# Patient Record
Sex: Male | Born: 1965
Health system: Southern US, Community
[De-identification: ages and names within clinical notes are randomized; demographics above are authoritative.]

## PROBLEM LIST (undated history)

## (undated) DIAGNOSIS — M1990 Unspecified osteoarthritis, unspecified site: Secondary | ICD-10-CM

## (undated) DIAGNOSIS — R51 Headache: Secondary | ICD-10-CM

## (undated) DIAGNOSIS — M7120 Synovial cyst of popliteal space [Baker], unspecified knee: Secondary | ICD-10-CM

## (undated) DIAGNOSIS — I639 Cerebral infarction, unspecified: Secondary | ICD-10-CM

## (undated) DIAGNOSIS — G43909 Migraine, unspecified, not intractable, without status migrainosus: Secondary | ICD-10-CM

## (undated) DIAGNOSIS — I499 Cardiac arrhythmia, unspecified: Secondary | ICD-10-CM

## (undated) DIAGNOSIS — M545 Low back pain, unspecified: Secondary | ICD-10-CM

## (undated) DIAGNOSIS — R569 Unspecified convulsions: Secondary | ICD-10-CM

## (undated) DIAGNOSIS — E785 Hyperlipidemia, unspecified: Secondary | ICD-10-CM

## (undated) DIAGNOSIS — F609 Personality disorder, unspecified: Secondary | ICD-10-CM

## (undated) DIAGNOSIS — M25569 Pain in unspecified knee: Secondary | ICD-10-CM

## (undated) DIAGNOSIS — G4733 Obstructive sleep apnea (adult) (pediatric): Secondary | ICD-10-CM

## (undated) DIAGNOSIS — C801 Malignant (primary) neoplasm, unspecified: Secondary | ICD-10-CM

## (undated) HISTORY — DX: Low back pain, unspecified: M54.50

## (undated) HISTORY — DX: Pain in unspecified knee: M25.569

## (undated) HISTORY — DX: Personality disorder, unspecified: F60.9

## (undated) HISTORY — DX: Migraine, unspecified, not intractable, without status migrainosus: G43.909

## (undated) HISTORY — DX: Synovial cyst of popliteal space (Baker), unspecified knee: M71.20

## (undated) HISTORY — PX: COLONOSCOPY: SHX174

## (undated) HISTORY — DX: Obstructive sleep apnea (adult) (pediatric): G47.33

## (undated) HISTORY — DX: Headache: R51

## (undated) HISTORY — DX: Low back pain: M54.5

---

## 1998-02-07 ENCOUNTER — Encounter: Admission: RE | Admit: 1998-02-07 | Discharge: 1998-03-09 | Payer: Self-pay | Admitting: Orthopedic Surgery

## 1998-10-10 ENCOUNTER — Encounter: Admission: RE | Admit: 1998-10-10 | Discharge: 1999-01-08 | Payer: Self-pay

## 2001-09-19 ENCOUNTER — Emergency Department (HOSPITAL_COMMUNITY): Admission: EM | Admit: 2001-09-19 | Discharge: 2001-09-19 | Payer: Self-pay | Admitting: Emergency Medicine

## 2003-02-28 ENCOUNTER — Ambulatory Visit (HOSPITAL_COMMUNITY): Admission: RE | Admit: 2003-02-28 | Discharge: 2003-02-28 | Payer: Self-pay | Admitting: Neurosurgery

## 2004-01-16 ENCOUNTER — Ambulatory Visit: Payer: Self-pay | Admitting: Internal Medicine

## 2004-04-16 ENCOUNTER — Ambulatory Visit: Payer: Self-pay | Admitting: Internal Medicine

## 2004-05-15 ENCOUNTER — Ambulatory Visit: Payer: Self-pay | Admitting: Internal Medicine

## 2004-07-31 ENCOUNTER — Ambulatory Visit: Payer: Self-pay | Admitting: Internal Medicine

## 2004-11-01 ENCOUNTER — Ambulatory Visit: Payer: Self-pay | Admitting: Internal Medicine

## 2004-11-21 ENCOUNTER — Ambulatory Visit: Payer: Self-pay | Admitting: Internal Medicine

## 2005-01-01 ENCOUNTER — Ambulatory Visit: Payer: Self-pay | Admitting: Internal Medicine

## 2005-01-31 ENCOUNTER — Ambulatory Visit: Payer: Self-pay | Admitting: Internal Medicine

## 2005-04-08 ENCOUNTER — Ambulatory Visit: Payer: Self-pay | Admitting: Internal Medicine

## 2005-07-08 ENCOUNTER — Ambulatory Visit: Payer: Self-pay | Admitting: Internal Medicine

## 2005-08-14 ENCOUNTER — Ambulatory Visit: Payer: Self-pay | Admitting: Internal Medicine

## 2005-09-27 ENCOUNTER — Ambulatory Visit: Payer: Self-pay | Admitting: Internal Medicine

## 2005-12-27 ENCOUNTER — Ambulatory Visit: Payer: Self-pay | Admitting: Internal Medicine

## 2005-12-27 LAB — CONVERTED CEMR LAB
ALT: 34 units/L (ref 0–40)
AST: 31 units/L (ref 0–37)
Albumin: 3.9 g/dL (ref 3.5–5.2)
Alkaline Phosphatase: 100 units/L (ref 39–117)
Bilirubin, Direct: 0.1 mg/dL (ref 0.0–0.3)
Chol/HDL Ratio, serum: 6.5
Cholesterol: 153 mg/dL (ref 0–200)
HDL: 23.4 mg/dL — ABNORMAL LOW (ref >39.0)
LDL DIRECT: 88.3 mg/dL
Total Bilirubin: 0.7 mg/dL (ref 0.3–1.2)
Total Protein: 7.2 g/dL (ref 6.0–8.3)
Triglyceride fasting, serum: 231 mg/dL (ref 0–149)
VLDL: 46 mg/dL — ABNORMAL HIGH (ref 0–40)

## 2006-01-29 ENCOUNTER — Ambulatory Visit: Payer: Self-pay | Admitting: Internal Medicine

## 2006-04-04 ENCOUNTER — Ambulatory Visit: Payer: Self-pay | Admitting: Internal Medicine

## 2006-09-16 ENCOUNTER — Telehealth: Payer: Self-pay | Admitting: Internal Medicine

## 2006-09-25 ENCOUNTER — Telehealth (INDEPENDENT_AMBULATORY_CARE_PROVIDER_SITE_OTHER): Payer: Self-pay | Admitting: *Deleted

## 2006-10-13 ENCOUNTER — Telehealth: Payer: Self-pay | Admitting: *Deleted

## 2006-11-06 DIAGNOSIS — M5440 Lumbago with sciatica, unspecified side: Secondary | ICD-10-CM

## 2006-11-06 DIAGNOSIS — R569 Unspecified convulsions: Secondary | ICD-10-CM | POA: Insufficient documentation

## 2006-11-06 DIAGNOSIS — M545 Low back pain, unspecified: Secondary | ICD-10-CM | POA: Insufficient documentation

## 2006-11-06 DIAGNOSIS — R519 Headache, unspecified: Secondary | ICD-10-CM | POA: Insufficient documentation

## 2006-11-06 DIAGNOSIS — R51 Headache: Secondary | ICD-10-CM

## 2006-11-10 ENCOUNTER — Ambulatory Visit: Payer: Self-pay | Admitting: Internal Medicine

## 2006-11-19 ENCOUNTER — Telehealth (INDEPENDENT_AMBULATORY_CARE_PROVIDER_SITE_OTHER): Payer: Self-pay | Admitting: *Deleted

## 2006-11-27 ENCOUNTER — Ambulatory Visit: Payer: Self-pay | Admitting: Internal Medicine

## 2006-11-27 LAB — CONVERTED CEMR LAB
ALT: 25 units/L (ref 0–53)
AST: 30 units/L (ref 0–37)
Albumin: 3.9 g/dL (ref 3.5–5.2)
Alkaline Phosphatase: 105 units/L (ref 39–117)
BUN: 12 mg/dL (ref 6–23)
Basophils Absolute: 0 10*3/uL (ref 0.0–0.1)
Basophils Relative: 0.2 % (ref 0.0–1.0)
Bilirubin Urine: NEGATIVE
Bilirubin, Direct: 0.2 mg/dL (ref 0.0–0.3)
Blood in Urine, dipstick: NEGATIVE
CO2: 29 meq/L (ref 19–32)
Calcium: 9.2 mg/dL (ref 8.4–10.5)
Chloride: 108 meq/L (ref 96–112)
Cholesterol: 134 mg/dL (ref 0–200)
Creatinine, Ser: 0.9 mg/dL (ref 0.4–1.5)
Eosinophils Absolute: 0.1 10*3/uL (ref 0.0–0.6)
Eosinophils Relative: 3.1 % (ref 0.0–5.0)
GFR calc Af Amer: 120 mL/min
GFR calc non Af Amer: 99 mL/min
Glucose, Bld: 90 mg/dL (ref 70–99)
Glucose, Urine, Semiquant: NEGATIVE
HCT: 39.7 % (ref 39.0–52.0)
HDL: 20.2 mg/dL — ABNORMAL LOW (ref >39.0)
Hemoglobin: 14.2 g/dL (ref 13.0–17.0)
Ketones, urine, test strip: NEGATIVE
LDL Cholesterol: 92 mg/dL (ref 0–99)
Lymphocytes Relative: 35.3 % (ref 12.0–46.0)
MCHC: 35.8 g/dL (ref 30.0–36.0)
MCV: 92.5 fL (ref 78.0–100.0)
Monocytes Absolute: 0.4 10*3/uL (ref 0.2–0.7)
Monocytes Relative: 8.6 % (ref 3.0–11.0)
Neutro Abs: 2.5 10*3/uL (ref 1.4–7.7)
Neutrophils Relative %: 52.8 % (ref 43.0–77.0)
Nitrite: NEGATIVE
Platelets: 164 10*3/uL (ref 150–400)
Potassium: 5 meq/L (ref 3.5–5.1)
Protein, U semiquant: NEGATIVE
RBC: 4.29 M/uL (ref 4.22–5.81)
RDW: 11.8 % (ref 11.5–14.6)
Sodium: 142 meq/L (ref 135–145)
Specific Gravity, Urine: >=1.03
TSH: 3.94 microintl units/mL (ref 0.35–5.50)
Total Bilirubin: 0.8 mg/dL (ref 0.3–1.2)
Total CHOL/HDL Ratio: 6.6
Total Protein: 6.7 g/dL (ref 6.0–8.3)
Triglycerides: 111 mg/dL (ref 0–149)
Urobilinogen, UA: 0.2
VLDL: 22 mg/dL (ref 0–40)
WBC Urine, dipstick: NEGATIVE
WBC: 4.6 10*3/uL (ref 4.5–10.5)
pH: 5.5

## 2006-12-04 ENCOUNTER — Ambulatory Visit: Payer: Self-pay | Admitting: Internal Medicine

## 2007-02-06 ENCOUNTER — Telehealth: Payer: Self-pay | Admitting: Internal Medicine

## 2007-03-31 ENCOUNTER — Ambulatory Visit: Payer: Self-pay | Admitting: Internal Medicine

## 2007-04-06 ENCOUNTER — Telehealth: Payer: Self-pay | Admitting: Internal Medicine

## 2007-04-09 ENCOUNTER — Ambulatory Visit: Payer: Self-pay | Admitting: Internal Medicine

## 2007-04-29 ENCOUNTER — Telehealth: Payer: Self-pay | Admitting: Internal Medicine

## 2007-04-30 ENCOUNTER — Telehealth: Payer: Self-pay | Admitting: Internal Medicine

## 2007-04-30 ENCOUNTER — Telehealth: Payer: Self-pay | Admitting: *Deleted

## 2007-06-08 ENCOUNTER — Telehealth: Payer: Self-pay | Admitting: Internal Medicine

## 2007-06-11 ENCOUNTER — Telehealth: Payer: Self-pay | Admitting: Internal Medicine

## 2007-06-11 ENCOUNTER — Encounter: Payer: Self-pay | Admitting: Internal Medicine

## 2007-06-16 ENCOUNTER — Encounter: Payer: Self-pay | Admitting: Internal Medicine

## 2007-07-03 ENCOUNTER — Telehealth: Payer: Self-pay | Admitting: Internal Medicine

## 2007-07-03 ENCOUNTER — Emergency Department (HOSPITAL_COMMUNITY): Admission: EM | Admit: 2007-07-03 | Discharge: 2007-07-03 | Payer: Self-pay | Admitting: Emergency Medicine

## 2007-07-07 ENCOUNTER — Ambulatory Visit: Payer: Self-pay | Admitting: Internal Medicine

## 2007-07-07 DIAGNOSIS — F21 Schizotypal disorder: Secondary | ICD-10-CM | POA: Insufficient documentation

## 2007-07-24 ENCOUNTER — Telehealth: Payer: Self-pay | Admitting: *Deleted

## 2007-08-04 ENCOUNTER — Telehealth: Payer: Self-pay | Admitting: Internal Medicine

## 2007-08-13 ENCOUNTER — Telehealth: Payer: Self-pay | Admitting: Internal Medicine

## 2007-08-31 ENCOUNTER — Telehealth: Payer: Self-pay | Admitting: Internal Medicine

## 2007-09-11 ENCOUNTER — Ambulatory Visit: Payer: Self-pay | Admitting: Internal Medicine

## 2007-09-22 ENCOUNTER — Telehealth: Payer: Self-pay | Admitting: Internal Medicine

## 2007-09-25 ENCOUNTER — Telehealth: Payer: Self-pay | Admitting: Internal Medicine

## 2007-09-25 ENCOUNTER — Ambulatory Visit: Payer: Self-pay | Admitting: Internal Medicine

## 2007-09-28 ENCOUNTER — Telehealth: Payer: Self-pay | Admitting: Internal Medicine

## 2007-10-06 ENCOUNTER — Telehealth: Payer: Self-pay | Admitting: Internal Medicine

## 2007-10-14 ENCOUNTER — Encounter: Payer: Self-pay | Admitting: Internal Medicine

## 2007-10-14 ENCOUNTER — Telehealth: Payer: Self-pay | Admitting: Internal Medicine

## 2007-10-19 ENCOUNTER — Telehealth: Payer: Self-pay | Admitting: Internal Medicine

## 2007-10-26 ENCOUNTER — Encounter: Payer: Self-pay | Admitting: Internal Medicine

## 2007-10-26 ENCOUNTER — Telehealth: Payer: Self-pay | Admitting: Internal Medicine

## 2007-10-31 ENCOUNTER — Emergency Department: Payer: Self-pay | Admitting: Emergency Medicine

## 2007-11-01 ENCOUNTER — Emergency Department (HOSPITAL_COMMUNITY): Admission: EM | Admit: 2007-11-01 | Discharge: 2007-11-01 | Payer: Self-pay | Admitting: Emergency Medicine

## 2007-11-03 ENCOUNTER — Telehealth (INDEPENDENT_AMBULATORY_CARE_PROVIDER_SITE_OTHER): Payer: Self-pay | Admitting: *Deleted

## 2007-11-04 ENCOUNTER — Ambulatory Visit: Payer: Self-pay | Admitting: Internal Medicine

## 2007-11-17 ENCOUNTER — Ambulatory Visit: Payer: Self-pay | Admitting: Internal Medicine

## 2007-12-07 ENCOUNTER — Telehealth: Payer: Self-pay | Admitting: Internal Medicine

## 2007-12-17 ENCOUNTER — Ambulatory Visit: Payer: Self-pay | Admitting: Internal Medicine

## 2007-12-17 LAB — CONVERTED CEMR LAB
ALT: 72 units/L — ABNORMAL HIGH (ref 0–53)
AST: 51 units/L — ABNORMAL HIGH (ref 0–37)
Albumin: 3.6 g/dL (ref 3.5–5.2)
Alkaline Phosphatase: 71 units/L (ref 39–117)
BUN: 11 mg/dL (ref 6–23)
Basophils Absolute: 0 10*3/uL (ref 0.0–0.1)
Basophils Relative: 0.8 % (ref 0.0–3.0)
Bilirubin Urine: NEGATIVE
Bilirubin, Direct: 0.1 mg/dL (ref 0.0–0.3)
Blood in Urine, dipstick: NEGATIVE
CO2: 29 meq/L (ref 19–32)
Calcium: 9 mg/dL (ref 8.4–10.5)
Chloride: 106 meq/L (ref 96–112)
Cholesterol: 137 mg/dL (ref 0–200)
Creatinine, Ser: 0.9 mg/dL (ref 0.4–1.5)
Direct LDL: 70.8 mg/dL
Eosinophils Absolute: 0.2 10*3/uL (ref 0.0–0.7)
Eosinophils Relative: 3.5 % (ref 0.0–5.0)
GFR calc Af Amer: 119 mL/min
GFR calc non Af Amer: 98 mL/min
Glucose, Bld: 89 mg/dL (ref 70–99)
Glucose, Urine, Semiquant: NEGATIVE
HCT: 42.3 % (ref 39.0–52.0)
HDL: 20.2 mg/dL — ABNORMAL LOW (ref >39.0)
Hemoglobin: 15.2 g/dL (ref 13.0–17.0)
Ketones, urine, test strip: NEGATIVE
Lymphocytes Relative: 41.1 % (ref 12.0–46.0)
MCHC: 36 g/dL (ref 30.0–36.0)
MCV: 92 fL (ref 78.0–100.0)
Monocytes Absolute: 0.6 10*3/uL (ref 0.1–1.0)
Monocytes Relative: 9.4 % (ref 3.0–12.0)
Neutro Abs: 2.7 10*3/uL (ref 1.4–7.7)
Neutrophils Relative %: 45.2 % (ref 43.0–77.0)
Nitrite: NEGATIVE
Platelets: 149 10*3/uL — ABNORMAL LOW (ref 150–400)
Potassium: 4 meq/L (ref 3.5–5.1)
Protein, U semiquant: NEGATIVE
RBC: 4.61 M/uL (ref 4.22–5.81)
RDW: 12.2 % (ref 11.5–14.6)
Sodium: 142 meq/L (ref 135–145)
Specific Gravity, Urine: 1.025
TSH: 4.1 microintl units/mL (ref 0.35–5.50)
Total Bilirubin: 0.8 mg/dL (ref 0.3–1.2)
Total CHOL/HDL Ratio: 6.8
Total Protein: 6.7 g/dL (ref 6.0–8.3)
Triglycerides: 289 mg/dL (ref 0–149)
Urobilinogen, UA: 0.2
VLDL: 58 mg/dL — ABNORMAL HIGH (ref 0–40)
WBC Urine, dipstick: NEGATIVE
WBC: 5.9 10*3/uL (ref 4.5–10.5)
pH: 5

## 2007-12-23 ENCOUNTER — Telehealth: Payer: Self-pay | Admitting: Internal Medicine

## 2007-12-24 ENCOUNTER — Ambulatory Visit: Payer: Self-pay | Admitting: Internal Medicine

## 2008-01-22 ENCOUNTER — Telehealth: Payer: Self-pay | Admitting: Internal Medicine

## 2008-01-27 ENCOUNTER — Telehealth: Payer: Self-pay | Admitting: Internal Medicine

## 2008-02-02 ENCOUNTER — Telehealth: Payer: Self-pay | Admitting: Internal Medicine

## 2008-03-02 ENCOUNTER — Telehealth: Payer: Self-pay | Admitting: Internal Medicine

## 2008-03-29 ENCOUNTER — Telehealth: Payer: Self-pay | Admitting: Internal Medicine

## 2008-04-05 ENCOUNTER — Encounter: Payer: Self-pay | Admitting: Internal Medicine

## 2008-04-13 ENCOUNTER — Ambulatory Visit: Payer: Self-pay | Admitting: Internal Medicine

## 2008-04-13 DIAGNOSIS — E162 Hypoglycemia, unspecified: Secondary | ICD-10-CM | POA: Insufficient documentation

## 2008-04-13 DIAGNOSIS — R635 Abnormal weight gain: Secondary | ICD-10-CM | POA: Insufficient documentation

## 2008-05-19 ENCOUNTER — Telehealth: Payer: Self-pay | Admitting: Internal Medicine

## 2008-06-10 ENCOUNTER — Telehealth: Payer: Self-pay | Admitting: Internal Medicine

## 2008-06-24 ENCOUNTER — Ambulatory Visit: Payer: Self-pay | Admitting: Internal Medicine

## 2008-07-26 ENCOUNTER — Telehealth: Payer: Self-pay | Admitting: Internal Medicine

## 2008-08-03 ENCOUNTER — Telehealth: Payer: Self-pay | Admitting: Internal Medicine

## 2008-08-05 ENCOUNTER — Telehealth: Payer: Self-pay | Admitting: Internal Medicine

## 2008-08-16 ENCOUNTER — Ambulatory Visit: Payer: Self-pay | Admitting: Internal Medicine

## 2008-10-12 ENCOUNTER — Ambulatory Visit: Payer: Self-pay | Admitting: Internal Medicine

## 2008-10-12 LAB — CONVERTED CEMR LAB
ALT: 51 units/L (ref 0–53)
AST: 42 units/L — ABNORMAL HIGH (ref 0–37)
Albumin: 4 g/dL (ref 3.5–5.2)
Alkaline Phosphatase: 72 units/L (ref 39–117)
BUN: 12 mg/dL (ref 6–23)
Basophils Absolute: 0 10*3/uL (ref 0.0–0.1)
Basophils Relative: 0.6 % (ref 0.0–3.0)
Bilirubin, Direct: 0.1 mg/dL (ref 0.0–0.3)
CO2: 27 meq/L (ref 19–32)
Calcium: 8.9 mg/dL (ref 8.4–10.5)
Chloride: 109 meq/L (ref 96–112)
Creatinine, Ser: 0.9 mg/dL (ref 0.4–1.5)
Eosinophils Absolute: 0.1 10*3/uL (ref 0.0–0.7)
Eosinophils Relative: 2.4 % (ref 0.0–5.0)
GFR calc non Af Amer: 97.8 mL/min (ref >60)
Glucose, Bld: 90 mg/dL (ref 70–99)
HCT: 44 % (ref 39.0–52.0)
Hemoglobin: 15.6 g/dL (ref 13.0–17.0)
Lymphocytes Relative: 35.9 % (ref 12.0–46.0)
Lymphs Abs: 2.1 10*3/uL (ref 0.7–4.0)
MCHC: 35.5 g/dL (ref 30.0–36.0)
MCV: 93.8 fL (ref 78.0–100.0)
Monocytes Absolute: 0.6 10*3/uL (ref 0.1–1.0)
Monocytes Relative: 10.7 % (ref 3.0–12.0)
Neutro Abs: 3 10*3/uL (ref 1.4–7.7)
Neutrophils Relative %: 50.4 % (ref 43.0–77.0)
Platelets: 155 10*3/uL (ref 150.0–400.0)
Potassium: 4 meq/L (ref 3.5–5.1)
RBC: 4.69 M/uL (ref 4.22–5.81)
RDW: 12 % (ref 11.5–14.6)
Sodium: 142 meq/L (ref 135–145)
TSH: 3.72 microintl units/mL (ref 0.35–5.50)
Total Bilirubin: 0.9 mg/dL (ref 0.3–1.2)
Total Protein: 7.1 g/dL (ref 6.0–8.3)
WBC: 5.8 10*3/uL (ref 4.5–10.5)

## 2008-10-17 ENCOUNTER — Telehealth (INDEPENDENT_AMBULATORY_CARE_PROVIDER_SITE_OTHER): Payer: Self-pay | Admitting: *Deleted

## 2008-12-14 ENCOUNTER — Encounter: Payer: Self-pay | Admitting: Internal Medicine

## 2009-01-11 ENCOUNTER — Ambulatory Visit: Payer: Self-pay | Admitting: Internal Medicine

## 2009-01-23 ENCOUNTER — Telehealth: Payer: Self-pay | Admitting: Internal Medicine

## 2009-03-21 ENCOUNTER — Telehealth: Payer: Self-pay | Admitting: Internal Medicine

## 2009-03-21 ENCOUNTER — Ambulatory Visit: Payer: Self-pay | Admitting: Internal Medicine

## 2009-05-10 ENCOUNTER — Telehealth: Payer: Self-pay | Admitting: Internal Medicine

## 2009-08-29 ENCOUNTER — Telehealth: Payer: Self-pay | Admitting: Internal Medicine

## 2009-09-25 ENCOUNTER — Telehealth: Payer: Self-pay | Admitting: Internal Medicine

## 2009-09-27 ENCOUNTER — Ambulatory Visit: Payer: Self-pay | Admitting: Internal Medicine

## 2009-09-27 LAB — CONVERTED CEMR LAB
BUN: 16 mg/dL (ref 6–23)
Basophils Absolute: 0 10*3/uL (ref 0.0–0.1)
Basophils Relative: 0.5 % (ref 0.0–3.0)
CO2: 25 meq/L (ref 19–32)
Calcium: 9 mg/dL (ref 8.4–10.5)
Chloride: 106 meq/L (ref 96–112)
Creatinine, Ser: 0.9 mg/dL (ref 0.4–1.5)
Eosinophils Absolute: 0.2 10*3/uL (ref 0.0–0.7)
Eosinophils Relative: 2.1 % (ref 0.0–5.0)
GFR calc non Af Amer: 92.6 mL/min (ref >60)
Glucose, Bld: 93 mg/dL (ref 70–99)
HCT: 39.4 % (ref 39.0–52.0)
Hemoglobin: 14.1 g/dL (ref 13.0–17.0)
Lymphocytes Relative: 28.3 % (ref 12.0–46.0)
Lymphs Abs: 2.2 10*3/uL (ref 0.7–4.0)
MCHC: 35.9 g/dL (ref 30.0–36.0)
MCV: 93.8 fL (ref 78.0–100.0)
Monocytes Absolute: 0.7 10*3/uL (ref 0.1–1.0)
Monocytes Relative: 9 % (ref 3.0–12.0)
Neutro Abs: 4.8 10*3/uL (ref 1.4–7.7)
Neutrophils Relative %: 60.1 % (ref 43.0–77.0)
Platelets: 181 10*3/uL (ref 150.0–400.0)
Potassium: 4.2 meq/L (ref 3.5–5.1)
RBC: 4.2 M/uL — ABNORMAL LOW (ref 4.22–5.81)
RDW: 13.6 % (ref 11.5–14.6)
Sodium: 141 meq/L (ref 135–145)
TSH: 3.66 microintl units/mL (ref 0.35–5.50)
WBC: 7.9 10*3/uL (ref 4.5–10.5)

## 2009-10-16 ENCOUNTER — Telehealth: Payer: Self-pay | Admitting: Internal Medicine

## 2009-11-07 ENCOUNTER — Telehealth: Payer: Self-pay | Admitting: Internal Medicine

## 2009-11-14 ENCOUNTER — Telehealth: Payer: Self-pay | Admitting: Internal Medicine

## 2009-11-14 DIAGNOSIS — G4719 Other hypersomnia: Secondary | ICD-10-CM | POA: Insufficient documentation

## 2010-01-11 ENCOUNTER — Telehealth: Payer: Self-pay | Admitting: Internal Medicine

## 2010-02-09 ENCOUNTER — Ambulatory Visit: Payer: Self-pay | Admitting: Internal Medicine

## 2010-02-09 ENCOUNTER — Telehealth: Payer: Self-pay | Admitting: *Deleted

## 2010-03-05 ENCOUNTER — Telehealth: Payer: Self-pay | Admitting: Internal Medicine

## 2010-03-06 ENCOUNTER — Telehealth: Payer: Self-pay | Admitting: Internal Medicine

## 2010-03-29 NOTE — Progress Notes (Signed)
Summary: decreased attention to detail affecting job  Phone Note Call from Patient Call back at [phone]   Caller: live Call For: Keriana Sarsfield Summary of Call: Med ? you gave him for seizures does work for seizure control, but has decreased attention to detail.  Boss pulled me aside to say he's going to have to start writing me up.  I don't want to lose my job.  Is there a med you could give that would help him think more clearly?  Or can you do a letter to my boss to help my situation?  [employer].  Allergic to ASA.  Tegretol didn't control seizures.   Initial call taken by: Rudy Jew, RN,  September 25, 2007 12:50 PM  Follow-up for Phone Call        probable needs OV to discuss Follow-up by: Stacie Glaze MD,  September 25, 2007 1:27 PM  Additional Follow-up for Phone Call Additional follow up Details #1::        Spoke to pt and he is requesting appt today. Additional Follow-up by: Lynann Beaver CMA,  September 25, 2007 2:16 PM

## 2010-03-29 NOTE — Progress Notes (Signed)
Summary: FYI  Phone Note Call from Patient   Caller: Spouse Summary of Call: Pt's wife wants Dr. Lovell Vincent to know that Steven Vincent's sizures have both been on the 5th of the month, and there was a full moon the next day.  Initial call taken by: Lynann Beaver CMA,  December 07, 2007 12:21 PM  Follow-up for Phone Call        dr Steven Vincent aware Follow-up by: Willy Eddy, LPN,  December 07, 2007 1:10 PM

## 2010-03-29 NOTE — Assessment & Plan Note (Signed)
Summary: cpx/njr   Vital Signs:  Patient Profile:   45 Years Old Male Height:     73 inches Weight:      264 pounds Temp:     98.5 degrees F rectal Pulse rate:   76 / minute Resp:     14 per minute BP sitting:   120 / 80  (left arm)  Vitals Entered By: Willy Eddy, LPN (December 04, 2006 2:36 PM)                 Chief Complaint:  cpx/dt today.  History of Present Illness: cpx  Current Allergies (reviewed today): ! ASA Updated/Current Medications (including changes made in today's visit):  HALOPERIDOL 1 MG TABS (HALOPERIDOL) three times a day TEGRETOL 200 MG  TABS (CARBAMAZEPINE) 4 times a day LOPRESSOR 100 MG  TABS (METOPROLOL TARTRATE) 1/2 two times a day TREXIMET 85-500 MG  TABS (SUMATRIPTAN-NAPROXEN SODIUM) one by mouth as needed migraine   Past Medical History:    Reviewed history from 11/06/2006 and no changes required:       Personality D/O       Knee Pain       Headache       Seizure disorder       Migraines       Low back pain  Past Surgical History:    Reviewed history from 11/06/2006 and no changes required:       Denies surgical history   Family History:    Reviewed history from 11/10/2006 and no changes required:       unknown       Family History of CAD Male 1st degree relative <50  Social History:    Reviewed history from 11/06/2006 and no changes required:       Occupation: worker       Single       Former Smoker    Review of Systems  The patient denies anorexia, fever, weight loss, vision loss, decreased hearing, hoarseness, chest pain, syncope, dyspnea on exhertion, peripheral edema, prolonged cough, hemoptysis, abdominal pain, melena, hematochezia, severe indigestion/heartburn, hematuria, incontinence, genital sores, muscle weakness, suspicious skin lesions, transient blindness, difficulty walking, depression, unusual weight change, abnormal bleeding, enlarged lymph nodes, angioedema, breast masses, and testicular masses.      migrane HA hx reviewed   Physical Exam  General:     alert.   Head:     normocephalic.   Eyes:     pupils equal and pupils round.   Ears:     R ear normal and L ear normal.   Nose:     no external deformity.   Mouth:     pharynx pink and moist.   Neck:     No deformities, masses, or tenderness noted. Chest Wall:     No deformities, masses, tenderness or gynecomastia noted. Lungs:     Normal respiratory effort, chest expands symmetrically. Lungs are clear to auscultation, no crackles or wheezes. Heart:     Normal rate and regular rhythm. S1 and S2 normal without gallop, murmur, click, rub or other extra sounds. Abdomen:     Bowel sounds positive,abdomen soft and non-tender without masses, organomegaly or hernias noted. Prostate:     Prostate gland firm and smooth, no enlargement, nodularity, tenderness, mass, asymmetry or induration. Msk:     No deformity or scoliosis noted of thoracic or lumbar spine.   Extremities:     No clubbing, cyanosis, edema, or deformity noted with normal  full range of motion of all joints.   Neurologic:     No cranial nerve deficits noted. Station and gait are normal. Plantar reflexes are down-going bilaterally. DTRs are symmetrical throughout. Sensory, motor and coordinative functions appear intact. Cervical Nodes:     No lymphadenopathy noted Axillary Nodes:     No palpable lymphadenopathy Psych:     Cognition and judgment appear intact. Alert and cooperative with normal attention span and concentration. No apparent delusions, illusions, hallucinations    Impression & Recommendations:  Problem # 1:  PHYSICAL EXAMINATION (ICD-V70.0) lot [id number].Exp 08/25/2007 Sanofi pasteur-left deltoid IM.0.50ml  Reviewed preventive care protocols, scheduled due services, and updated immunizations.   Problem # 2:  HEADACHE (ICD-784.0) avoid MSG,  His updated medication list for this problem includes:    Lopressor 100 Mg Tabs (Metoprolol tartrate) .Marland Kitchen...  1/2 two times a day    Treximet 85-500 Mg Tabs (Sumatriptan-naproxen sodium) ..... One by mouth as needed migraine Headache diary reviewed. potato chips a=have been elemenated with the reduction in HA   Problem # 3:  SEIZURE DISORDER (ICD-780.39)  His updated medication list for this problem includes:    Tegretol 200 Mg Tabs (Carbamazepine) .Marland KitchenMarland KitchenMarland KitchenMarland Kitchen 4 times a day  Labs Reviewed: Hgb: 14.2 (11/27/2006)   Hct: 39.7 (11/27/2006)    SGOT: 30 (11/27/2006)   SGPT: 25 (11/27/2006)      Complete Medication List: 1)  Haloperidol 1 Mg Tabs (Haloperidol) .... Three times a day 2)  Tegretol 200 Mg Tabs (Carbamazepine) .... 4 times a day 3)  Lopressor 100 Mg Tabs (Metoprolol tartrate) .... 1/2 two times a day 4)  Treximet 85-500 Mg Tabs (Sumatriptan-naproxen sodium) .... One by mouth as needed migraine  Other Orders: Influenza Vaccine NON MCR (16109) Tdap => 55yrs IM (60454) Admin 1st Vaccine (09811)   Patient Instructions: 1)  Please schedule a follow-up appointment in 4 months.    ]  Tetanus/Td Vaccine    Vaccine Type: Tdap    Site: right deltoid    Mfr: Sanofi Pasteur    Dose: 0.5 ml    Route: IM    Given by: Willy Eddy, LPN    Exp. Date: 11/04/2008    Lot #: [id number]  Influenza Vaccine    Vaccine Type: Fluvax Non-MCR    Given by: Willy Eddy, LPN  Flu Vaccine Consent Questions    Do you have a history of severe allergic reactions to this vaccine? no    Any prior history of allergic reactions to egg and/or gelatin? no    Do you have a sensitivity to the preservative Thimersol? no    Do you have a past history of Guillan-Barre Syndrome? no    Do you currently have an acute febrile illness? no    Have you ever had a severe reaction to latex? no    Vaccine information given and explained to patient? yes

## 2010-03-29 NOTE — Progress Notes (Signed)
  Phone Note Call from Patient Call back at [facility] Phone [phone]   Caller: Patient Complaint: Urinary/GYN Problems Summary of Call: Pt called and said that the [facility] called him and told him that he was no longer taking the Haloperidol 1 mg. This is the first he has heard of this, just wants to confirm this and wants to know why. Initial call taken by: Army Fossa, CMA    New/Updated Medications: HALOPERIDOL 1 MG TABS (HALOPERIDOL) 1 in am and 2  at bedtime   Appended Document:  per der jenkin-he is on haldol

## 2010-03-29 NOTE — Progress Notes (Signed)
Summary: send rx  Phone Note Refill Request Call back at Home Phone [phone] Message from:  Patient---live call  Refills Requested: Medication #1:  HALOPERIDOL 1 MG TABS 1 in am and 2  at bedtime need [facility] day supply [facility] until his shipment arrives.  Initial call taken by: Warnell Forester,  November 07, 2009 10:30 AM  Follow-up for Phone Call        done Follow-up by: Willy Eddy, LPN,  November 07, 2009 12:18 PM

## 2010-03-29 NOTE — Progress Notes (Signed)
Summary: FYI  Phone Note Call from Patient   Caller: Patient Call For: Dr. Lovell Sheehan Summary of Call: [phone]  Pt. wants Dr. Lovell Sheehan to know that he saw Dr. Lesia Sago says he does not have seizures,  and was given Pamelor 30 mg. for headaches. Dr. Anne Hahn took him off of some of his seizure meds, but he isn't sure which one. Initial call taken by: Lynann Beaver CMA,  October 19, 2007 11:35 AM

## 2010-03-29 NOTE — Progress Notes (Signed)
Summary: refill  Phone Note Refill Request Call back at [phone] Message from:  Patient---live call  Refills Requested: Medication #1:  HALOPERIDOL 1 MG TABS 1 in am and 2  at bedtime call [facility] @ [phone]. has appt this wed. add additional refills   rx [phone]  Initial call taken by: Warnell Forester,  September 25, 2009 2:23 PM    Prescriptions: HALOPERIDOL 1 MG TABS (HALOPERIDOL) 1 in am and 2  at bedtime  #270 x 6   Entered by:   Willy Eddy, LPN   Authorized by:   Stacie Glaze MD   Signed by:   Willy Eddy, LPN on 56/38/7564   Method used:   Faxed to ...       [facility] (retail)       [address]       [place], [place]  [address]       Ph: [phone]       Fax: [phone]   RxID:   [id number]

## 2010-03-29 NOTE — Progress Notes (Signed)
Summary: refill  Phone Note Call from Patient Call back at Home Phone [phone]   Caller: Patient-walk in Summary of Call: refill Haloperidol and Cymbalta for a year. Call [facility] at [phone]. Initial call taken by: Warnell Forester,  March 21, 2009 10:27 AM    Prescriptions: CYMBALTA 30 MG CPEP (DULOXETINE HCL) 1 once daily  #90 x 3   Entered by:   Willy Eddy, LPN   Authorized by:   Stacie Glaze MD   Signed by:   Willy Eddy, LPN on 59/56/3875   Method used:   Faxed to ...       [facility] (retail)       [address]       [place], [place]  [address]       Ph: [phone]       Fax: [phone]   RxID:   [id number] HALOPERIDOL 1 MG TABS (HALOPERIDOL) 1 in am and 2  at bedtime  #270 x 3   Entered by:   Willy Eddy, LPN   Authorized by:   Stacie Glaze MD   Signed by:   Willy Eddy, LPN on 23/76/2831   Method used:   Faxed to ...       [facility] (retail)       [address]       [place], [place]  [address]       Ph: [phone]       Fax: [phone]   RxID:   [id number]

## 2010-03-29 NOTE — Progress Notes (Signed)
Summary: NEED SAPLES/NOTE  Medications Added EPITOL 200 MG TABS (CARBAMAZEPINE)  HALOPERIDOL 1 MG TABS (HALOPERIDOL)        Phone Note Call from Patient Call back at [facility] Phone [phone]   Caller: Patient Call For: DR Lennon Alstrom Reason for Call: Acute Illness, Talk to Nurse Summary of Call: C/O MIGRAINE TODAY. WOULD LIKE IMITREX SAMPLES AND A DOCTOR'S EXCUSE NOTE FOR BEING OUT OF WORK TODAY. Initial call taken by: Warnell Forester,  September 16, 2006 9:32 AM  Follow-up for Phone Call        wE DO NOT GET ANY IMETREX SAMPLES  SO HE WILL HAVE TO GET HIS PRESCRIPTION FILLED. THE nOTE IS ok Follow-up by: Stacie Glaze MD,  September 16, 2006 12:17 PM  Additional Follow-up for Phone Call Additional follow up Details #1::        pt advised does not want to have rx called in.  bonnye advised excedrin migraine.  pt will pick up note at window Additional Follow-up by: Rudy Jew, RN,  September 16, 2006 12:34 PM    New/Updated Medications: EPITOL 200 MG TABS (CARBAMAZEPINE)  HALOPERIDOL 1 MG TABS (HALOPERIDOL)

## 2010-03-29 NOTE — Letter (Signed)
Summary: Generic Letter  [place] [facility]  [address] [place], [place] [address]   Phone: [phone]  Fax: [phone]    11/17/2007  [facility] [address] [place], [place]  [address]  Dear Mr. 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 Recently your neurologist stopped the topamax and you began to have increased jerking and episodes of migraine with loss of vision  This confirms our prior observation that it is almost impossible for Herold to maintain employment.  As of now his condition prevents him from driving.    Sincerely,   Darryll Capers MD [place] at [facility]

## 2010-03-29 NOTE — Progress Notes (Signed)
Summary: samples  Phone Note Call from Patient   Summary of Call: Pt want samples of headache meds.......Marland KitchenRelpax, Imitrex, etc. [id number] Initial call taken by: Lynann Beaver CMA,  March 02, 2008 10:46 AM  Follow-up for Phone Call        treximet up fron t for pt and he was notified Follow-up by: Willy Eddy, LPN,  March 02, 2008 12:20 PM

## 2010-03-29 NOTE — Assessment & Plan Note (Signed)
Summary: to be evaluated for bi polar   Vital Signs:  Patient profile:   45 year old male Height:      73 inches Weight:      280 pounds BMI:     37.08 Temp:     98.2 degrees F oral Pulse rate:   80 / minute Resp:     14 per minute BP sitting:   120 / 80  (left arm)  Vitals Entered By: Willy Eddy, LPN (June 24, 2008 12:27 PM)  CC:  TOA- FORM COMPLETION.  History of Present Illness: counsilling and form completion no exam  Allergies: 1)  ! Asa   Impression & Recommendations:  Problem # 1:  SEIZURE DISORDER (ICD-780.39)  The following medications were removed from the medication list:    Depakote 500 Mg Tbec (Divalproex sodium) .Marland Kitchen... 1 once daily His updated medication list for this problem includes:    Clonazepam Odt 0.5 Mg Tbdp (Clonazepam) .Marland Kitchen... 1 three times a day    Neurontin 300 Mg Caps (Gabapentin) .Marland Kitchen... 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 1 as needed sever headache  Discussed use of moist heat or ice, modified activities, medications, and stretching/strengthening exercises. Back care instructions given. To be seen in 2 weeks if no improvement; sooner if worsening of symptoms.   Complete Medication List: 1)  Treximet 85-500 Mg Tabs (Sumatriptan-naproxen sodium) .... One by mouth as needed migraine 2)  Clonazepam Odt  0.5 Mg Tbdp (Clonazepam) .Marland Kitchen.. 1 three times a day 3)  Neurontin 300 Mg Caps (Gabapentin) .Marland Kitchen.. 1 three times a day  Patient Instructions: 1)  add cymbalta one by mouth daily as samples 2)  Please schedule a follow-up appointment in 1 month. 3)  It is important that you exercise regularly at least 20 minutes 5 times a week. If you develop chest pain, have severe difficulty breathing, or feel very tired , stop exercising immediately and seek medical attention.

## 2010-03-29 NOTE — Progress Notes (Signed)
Summary: pt called again twitching and migraine  Phone Note Call from Patient   Caller: Patient Call For: Dr. Lovell Sheehan Summary of Call: Call from pt stating that his "twitching" is worse x 3 days, and he is taking all meds correctly.  He has a migraine today also. What should he do?  [phone] Initial call taken by: Lynann Beaver CMA,  April 29, 2007 11:12 AM  Follow-up for Phone Call        patient called back,  headache has subsided but is still twitching.  what to do?  [phone] ..................................................................Marland KitchenRoselle Locus  April 29, 2007 1:47 PM  Additional Follow-up for Phone Call Additional follow up Details #1::        hold next dose of tegretol, take benadryl 50mg  x one  pt. given Dr. York Ram recommendations. ..................................................................Marland KitchenLynann Beaver CMA  April 29, 2007 2:31 PM Additional Follow-up by: Stacie Glaze MD,  April 29, 2007 2:19 PM

## 2010-03-29 NOTE — Progress Notes (Signed)
Summary: update   Phone Note Call from Patient   Caller: pt live Call For: [facility] Summary of Call: he was driving and had a twitching spell.  He came home and had two major spells and called the Dr. Renie Ora.,   He changed the med to depakote.  He lost his job.  He applied for social security disability and [employer] are working on the disability for him.   Initial call taken by: Roselle Locus,  January 27, 2008 10:38 AM  Follow-up for Phone Call        dr Lovell Sheehan aware Follow-up by: Willy Eddy, LPN,  January 27, 2008 10:42 AM

## 2010-03-29 NOTE — Progress Notes (Signed)
Summary: pt req script for Haloperidol 1mg  to [facility] mail order pharm  Phone Note Call from Patient   Caller: Patient Summary of Call: Pt req refill of Haloperidol 1mg  three times a day #90. Please call in to mail order pharmacy [facility] [phone].  Initial call taken by: Lucy Antigua,  January 23, 2009 12:47 PM  Follow-up for Phone Call        PT CALLED TO ADVISE THAT THE PHARMACY HE GOES THROUGH [facility] [place], [place] @ [phone]. Follow-up by: Debbra Riding,  January 23, 2009 12:51 PM    Prescriptions: HALOPERIDOL 1 MG TABS (HALOPERIDOL) 1 in am and 2  at bedtime  #0 x 0   Entered and Authorized by:   Willy Eddy, LPN   Signed by:   Willy Eddy, LPN on 08/65/7846   Method used:   Faxed to ...       [facility] (retail)       [address]       [place], [place]  [address]       Ph: [phone]       Fax: [phone]   RxID:   [id number]   Appended Document: pt req script for Haloperidol 1mg  to [facility] mail order pharm this was called into [facility]  Appended Document: pt req script for Haloperidol 1mg  to [facility] mail order pharm this was called into [facility] and not faxed to medical assistance

## 2010-03-29 NOTE — Assessment & Plan Note (Signed)
Summary: fu per pt/njr   Vital Signs:  Patient Profile:   45 Years Old Male Height:     73 inches Weight:      246 pounds Temp:     98.5 degrees F oral Pulse rate:   80 / minute BP sitting:   134 / 82  (left arm)  Vitals Entered By: Willy Eddy, LPN (November 17, 2007 3:39 PM)                 Chief Complaint:  roa- pt lostj ob due to being unable to drive (neurologist orders-)med ch anges.  History of Present Illness:  Follow-Up Visit      This is a 45 year old man who presents for Follow-up visit.  myoclonus and migraines.  The patient denies chest pain, palpitations, dizziness, syncope, low blood sugar symptoms, high blood sugar symptoms, edema, SOB, DOE, PND, and orthopnea.  Since the last visit the patient notes problems with medications and a recent ED visit.  The patient reports not taking meds as prescribed.  When questioned about possible medication side effects, the patient notes headaches.      Current Allergies: ! ASA  Past Medical History:    Reviewed history from 11/06/2006 and no changes required:       Personality D/O       Knee Pain       Headache       Seizure disorder( myoclonus)       Migraines       Low back pain       myoclonus  Past Surgical History:    Reviewed history from 11/06/2006 and no changes required:       Denies surgical history   Family History:    Reviewed history from 07/07/2007 and no changes required:       unknown  ( astranged)       Family History of CAD Male 1st degree relative <50  Social History:    Reviewed history from 12/04/2006 and no changes required:       Occupation: worker       Single       Former Smoker    Review of Systems       The patient complains of headaches.  The patient denies anorexia, fever, weight loss, weight gain, vision loss, decreased hearing, hoarseness, chest pain, syncope, dyspnea on exertion, peripheral edema, prolonged cough, hemoptysis, abdominal pain, melena, hematochezia,  severe indigestion/heartburn, hematuria, incontinence, genital sores, muscle weakness, suspicious skin lesions, transient blindness, difficulty walking, depression, unusual weight change, abnormal bleeding, enlarged lymph nodes, angioedema, breast masses, and testicular masses.     Physical Exam  General:     Well-developed,well-nourished,in no acute distress; alert,appropriate and cooperative throughout examinationalert.   Head:     Normocephalic and atraumatic without obvious abnormalities. No apparent alopecia or balding. Eyes:     No corneal or conjunctival inflammation noted. EOMI. Perrla. Funduscopic exam benign, without hemorrhages, exudates or papilledema. Vision grossly normal. Ears:     External ear exam shows no significant lesions or deformities.  Otoscopic examination reveals clear canals, tympanic membranes are intact bilaterally without bulging, retraction, inflammation or discharge. Hearing is grossly normal bilaterally. Mouth:     Oral mucosa and oropharynx without lesions or exudates.  Teeth in good repair. Neck:     No deformities, masses, or tenderness noted. Lungs:     Normal respiratory effort, chest expands symmetrically. Lungs are clear to auscultation, no crackles or wheezes. Heart:  Normal rate and regular rhythm. S1 and S2 normal without gallop, murmur, click, rub or other extra sounds. Abdomen:     Bowel sounds positive,abdomen soft and non-tender without masses, organomegaly or hernias noted. Msk:     lumbar lordosis and SI joint tenderness.   Pulses:     R and L carotid,radial,femoral,dorsalis pedis and posterior tibial pulses are full and equal bilaterally Extremities:     No clubbing, cyanosis, edema, or deformity noted with normal full range of motion of all joints.   Neurologic:     alert & oriented X3, cranial nerves II-XII intact, strength normal in all extremities, finger-to-nose normal, heel-to-shin normal, and Romberg negative.   Psych:      Oriented X3 and memory intact for recent and remote.      Impression & Recommendations:  Problem # 1:  Preventive Health Care (ICD-V70.0) Flu Vaccine Consent Questions     Do you have a history of severe allergic reactions to this vaccine? no    Any prior history of allergic reactions to egg and/or gelatin? no    Do you have a sensitivity to the preservative Thimersol? no    Do you have a past history of Guillan-Barre Syndrome? no    Do you currently have an acute febrile illness? no    Have you ever had a severe reaction to latex? no    Vaccine information given and explained to patient? yes    Are you currently pregnant? no    Lot Number:[id number]   Site Given  Left Deltoid IM  Reviewed preventive care protocols, scheduled due services, and updated immunizations.   Problem # 2:  SEIZURE DISORDER (ICD-780.39) pt is on clonazepam for jerking with good control of myoclonis The following medications were removed from the medication list:    Topamax 100 Mg Tabs (Topiramate) .Marland Kitchen... At bedtime  His updated medication list for this problem includes:    Clonazepam Odt 0.5 Mg Tbdp (Clonazepam) .Marland Kitchen... 1 two times a day  Labs Reviewed: Hgb: 14.2 (11/27/2006)   Hct: 39.7 (11/27/2006)    SGOT: 30 (11/27/2006)   SGPT: 25 (11/27/2006)      Problem # 3:  HEADACHE (ICD-784.0) hx of migraines with visual aura His updated medication list for this problem includes:    Treximet 85-500 Mg Tabs (Sumatriptan-naproxen sodium) ..... One by mouth as needed migraine Headache diary reviewed.   Complete Medication List: 1)  Haloperidol 1 Mg Tabs (Haloperidol) .... Three times a day 2)  Treximet 85-500 Mg Tabs (Sumatriptan-naproxen sodium) .... One by mouth as needed migraine 3)  [facility] Magnesium 250 Mg Tabs (Magnesium) .... 2 once daily 4)  Nortriptyline Hcl 10 Mg Caps (Nortriptyline hcl) .... 3 at bedtime 5)  Clonazepam Odt 0.5 Mg Tbdp (Clonazepam) .Marland Kitchen.. 1 two times a day  Other Orders: Flu Vaccine  45yrs + (16109) Admin of Therapeutic Inj (IM or [place]) (60454)   Patient Instructions: 1)  Please schedule a follow-up appointment in 3 months.   ]

## 2010-03-29 NOTE — Progress Notes (Signed)
Summary: note & samples  Phone Note Call from Patient Call back at [phone]   Caller: live Call For: Steven Vincent Summary of Call: Stayed home today & will need note to go back to work tomorrow.  Sinus infection, couldn't breathe because nose stopped up, throat drainage, yellow mucus.  Also leg cramps & just felt bad.  Couldn't sleep.  2)  request any samples you have for the sinus.  Using [facility].  Can't afford med.  Only has $10 to his name.  [employer].  Allergic to ASA Initial call taken by: Rudy Jew, RN,  October 14, 2007 8:19 AM  Follow-up for Phone Call        [name] needs to be very carefull about skippping work. may have a note and some sample of maxifed that are in the closet Follow-up by: Stacie Glaze MD,  October 14, 2007 8:40 AM  Additional Follow-up for Phone Call Additional follow up Details #1::        Maxifed samples #10 one daily available & note done.  Patient advised.   Additional Follow-up by: Rudy Jew, RN,  October 14, 2007 10:02 AM

## 2010-03-29 NOTE — Progress Notes (Signed)
Summary: FMLA forms  Phone Note Call from Patient   Caller: Patient Call For: Dr. Lovell Sheehan Reason for Call: Talk to Doctor Summary of Call: Pt left his paper work off this am........Marland Kitchenhe did not sleep at all this weekend concerned about what has happened. He did go to work today and it is going ok. [phone] Initial call taken by: Lynann Beaver CMA,  September 28, 2007 1:45 PM  Follow-up for Phone Call        dr j enkins has fmla to complete and will call pt when completedl Follow-up by: Willy Eddy, LPN,  September 28, 2007 3:30 PM

## 2010-03-29 NOTE — Assessment & Plan Note (Signed)
Summary: Sinus problems x 1 week,cough congestion/nn   Vital Signs:  Patient Profile:   45 Years Old Male Height:     73 inches Temp:     98.6 degrees F oral Pulse rate:   76 / minute Resp:     14 per minute BP sitting:   140 / 80  (left arm)  Vitals Entered By: Willy Eddy, LPN (March 31, 2007 12:13 PM)                 Chief Complaint:  c/o pnd and cough/.  History of Present Illness: acute visit for sinuses and sore through wth myalgias for 2-3 days  Current Allergies: ! ASA  Past Medical History:    Reviewed history from 11/06/2006 and no changes required:       Personality D/O       Knee Pain       Headache       Seizure disorder       Migraines       Low back pain  Past Surgical History:    Reviewed history from 11/06/2006 and no changes required:       Denies surgical history   Family History:    Reviewed history from 12/04/2006 and no changes required:       unknown       Family History of CAD Male 1st degree relative <50  Social History:    Reviewed history from 12/04/2006 and no changes required:       Occupation: worker       Single       Former Smoker    Review of Systems       The patient complains of fever, hoarseness, and prolonged cough.  The patient denies anorexia, weight loss, vision loss, decreased hearing, syncope, peripheral edema, hemoptysis, abdominal pain, melena, hematochezia, severe indigestion/heartburn, hematuria, incontinence, genital sores, muscle weakness, suspicious skin lesions, transient blindness, difficulty walking, depression, unusual weight change, abnormal bleeding, enlarged lymph nodes, angioedema, breast masses, and testicular masses.     Physical Exam  Head:     normocephalic.   Eyes:     pupils equal and pupils round.   Nose:     L frontal sinus tenderness and R frontal sinus tenderness.   Mouth:     posterior lymphoid hypertrophy and postnasal drip.   Neck:     No deformities, masses, or  tenderness noted. Lungs:     Normal respiratory effort, chest expands symmetrically. Lungs are clear to auscultation, no crackles or wheezes. Heart:     Normal rate and regular rhythm. S1 and S2 normal without gallop, murmur, click, rub or other extra sounds.    Impression & Recommendations:  Problem # 1:  UPPER RESPIRATORY INFECTION, VIRAL (ICD-465.9) Assessment: Unchanged appropriate OTC care discussed  Problem # 2:  HEADACHE (ICD-784.0) Assessment: Unchanged samples give His updated medication list for this problem includes:    Lopressor 100 Mg Tabs (Metoprolol tartrate) .Marland Kitchen... 1/2 two times a day    Treximet 85-500 Mg Tabs (Sumatriptan-naproxen sodium) ..... One by mouth as needed migraine Headache diary reviewed.   Complete Medication List: 1)  Haloperidol 1 Mg Tabs (Haloperidol) .... Three times a day 2)  Tegretol 200 Mg Tabs (Carbamazepine) .... 4 times a day 3)  Lopressor 100 Mg Tabs (Metoprolol tartrate) .... 1/2 two times a day 4)  Treximet 85-500 Mg Tabs (Sumatriptan-naproxen sodium) .... One by mouth as needed migraine     ]

## 2010-03-29 NOTE — Progress Notes (Signed)
Summary: diarrhea  Phone Note Call from Patient   Caller: Patient Call For: Stacie Glaze MD Summary of Call: Pt. states since the Neurologist changed his meds, he is having diarrhea x one time daily.  Does not feels sick.  Feet and nose tingle.  Taste has changed. The Neurologist stopped his Neurontin, and put him on :  Haldol 1 mg one by mouth three times a day,  Cymbalta 30 mg. one by mouth daily, and Topiramate 100 mg. one q hs. "everytime he eats oatmeal and coffee, he has stomach cramps and diarrhea" ???? [phone] Initial call taken by: Lynann Beaver CMA,  May 10, 2009 11:30 AM  Follow-up for Phone Call        talked with pt- if he thinks it is comiing from new meds, call md who changed meds- if he thinks virus , go on clear liquid diet for 48 hours and stop eatting coffee and oatmeal Follow-up by: Willy Eddy, LPN,  May 10, 2009 12:09 PM

## 2010-03-29 NOTE — Progress Notes (Signed)
Summary: Pt says Cymbalta is not covered by Medicare  Phone Note Call from Patient Call back at [phone]   Caller: will Summary of Call: Cymbalta is not covered under Medicare. Pts medicare starts in March 2012. Pt sch to see Dr Lovell Sheehan in March, but if med needs to be changed before then, pls call in to [facility].  Initial call taken by: Lucy Antigua,  March 05, 2010 11:14 AM  Follow-up for Phone Call        will discuss changing at march ov Follow-up by: Willy Eddy, LPN,  March 05, 2010 11:18 AM

## 2010-03-29 NOTE — Progress Notes (Signed)
Summary: samples  Phone Note Call from Patient   Caller: Patient Call For: Stacie Glaze MD Summary of Call: Pt is requesting samples of Imitrex , Zomig or whatever we have for migraines. He is going out of town. [phone] cell [phone] Initial call taken by: Lynann Beaver CMA,  August 31, 2007 10:08 AM  Follow-up for Phone Call        none available and pt informed Follow-up by: Willy Eddy, LPN,  August 30, 1912 11:10 AM         Appended Document: samples samples of zomig given and pt informed

## 2010-03-29 NOTE — Progress Notes (Signed)
Summary: needs samples   Phone Note Call from Patient Call back at Home Phone [phone]   Caller: pt live Call For: Lovell Sheehan Reason for Call: Talk to Doctor Summary of Call: relpax 40mg   needs samples has a headache again  Initial call taken by: Roselle Locus,  Jul 24, 2007 2:17 PM  Follow-up for Phone Call        bsamples of zomig given and pt notified Follow-up by: Willy Eddy, LPN,  Jul 24, 2007 4:29 PM

## 2010-03-29 NOTE — Progress Notes (Signed)
Summary: fyi  Phone Note Call from Patient Call back at [phone]   Caller: Patient Call For: Stacie Glaze MD Summary of Call: Vocational rehab will be contacting doc concerning pt limitations. He is trying to work parttime no more than 15 hrs a wk. He can make up to 1000.00 a month without no interruption to his SSI check. Initial call taken by: Heron Sabins,  January 11, 2010 10:28 AM  Follow-up for Phone Call        noted Follow-up by: Stacie Glaze MD,  January 11, 2010 9:17 PM

## 2010-03-29 NOTE — Progress Notes (Signed)
  Phone Note Call from Patient Call back at Home Phone [phone]   Caller: Patient Call For: Lovell Sheehan Reason for Call: Refill Medication, Talk to Doctor Summary of Call: Ran out of samples of Treximet 85 -500 m.g. Need more samples until he sees him next month Initial call taken by: Barnie Mort,  October 13, 2006 2:59 PM  Follow-up for Phone Call        samples out front and pt informed Follow-up by: Willy Eddy, LPN,  October 13, 2006 5:35 PM

## 2010-03-29 NOTE — Assessment & Plan Note (Signed)
Summary: 3 MONTH ROV/NJR/pt rescd//ccm PT RSC/NJR/PT RSC/CJR   Vital Signs:  Patient profile:   45 year old male Height:      73 inches Weight:      278 pounds BMI:     36.81 Temp:     98.8 degrees F oral BP sitting:   130 / 90  (left arm) Cuff size:   large  Vitals Entered By: Kathrynn Speed CMA (September 27, 2009 2:22 PM) CC: 3 mth rov, seeing spots, shoulder hurting, Right elbow hurts in joint, hands hurting, src Is Patient Diabetic? No   CC:  3 mth rov, seeing spots, shoulder hurting, Right elbow hurts in joint, hands hurting, and src.  History of Present Illness: pt is in school for GED head ache pattern is stable but intensity is less has increased amount of arthritic pain in knees, shoulders and hands, has mild scotoma has prodrome before his head aches of wavy lines still has persistnat back pain the pt has seen the neurologist and medications are now through an assistance  program his neurological symptoms have improved   Preventive Screening-Counseling & Management  Alcohol-Tobacco     Smoking Status: quit     Passive Smoke Exposure: no  Problems Prior to Update: 1)  Reactive Hypoglycemia  (ICD-251.2) 2)  Weight Gain  (ICD-783.1) 3)  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 One By Mouth As Needed Migraine 2)  Clonazepam Odt 0.5 Mg Tbdp (Clonazepam) .Marland Kitchen.. 1 Three Times A Day Hold 3)  Neurontin 300 Mg Caps (Gabapentin) .Marland Kitchen.. 1 Three Times A Day 4)  Haloperidol 1 Mg Tabs (Haloperidol) .Marland Kitchen.. 1 in Am and 2  At Bedtime 5)  Cymbalta 30 Mg Cpep (Duloxetine Hcl) .Marland Kitchen.. 1 Once Daily 6)  Topiramate 25 Mg Tabs (Topiramate) .... One By Mouth Daily  Current Medications  (verified): 1)  Treximet 85-500 Mg  Tabs (Sumatriptan-Naproxen Sodium) .... One By Mouth As Needed Migraine 2)  Haloperidol 1 Mg Tabs (Haloperidol) .Marland Kitchen.. 1 in Am and 2  At Bedtime 3)  Cymbalta 30 Mg Cpep (Duloxetine Hcl) .Marland Kitchen.. 1 Once Daily 4)  Topiramate 100 Mg Tabs (Topiramate) .... One By Mouth Daily At Bedtime  Allergies (verified): 1)  ! Jonne Ply  Past History:  Family History: Last updated: 07/07/2007 unknown  ( astranged) Family History of CAD Male 1st degree relative <50  Social History: Last updated: 12/04/2006 Occupation: worker Single Former Smoker  Risk Factors: Smoking Status: quit (09/27/2009) Passive Smoke Exposure: no (09/27/2009)  Past medical, surgical, family and social histories (including risk factors) reviewed, and no changes noted (except as noted below).  Past Medical History: Reviewed history from 11/17/2007 and no changes required. Personality D/O Knee Pain Headache Seizure disorder( myoclonus) Migraines Low back pain myoclonus  Past Surgical History: Reviewed history from 11/06/2006 and no changes required. Denies surgical history  Family History: Reviewed history from 07/07/2007 and no changes required. unknown  ( astranged) Family History of CAD Male 1st degree relative <50  Social History: Reviewed history from 12/04/2006 and no changes required. Occupation: worker Single Former Smoker  Review of Systems  The patient denies anorexia, fever, weight loss, weight gain, vision loss, decreased hearing, hoarseness, chest pain, syncope, dyspnea on  exertion, peripheral edema, prolonged cough, headaches, hemoptysis, abdominal pain, melena, hematochezia, severe indigestion/heartburn, hematuria, incontinence, genital sores, muscle weakness, suspicious skin lesions, transient blindness, difficulty walking, depression, unusual weight change, abnormal bleeding, enlarged lymph nodes, angioedema, breast masses, and testicular masses.    Physical  Exam  General:  alert and overweight-appearing.   Head:  normocephalic, atraumatic, and male-pattern balding.   Eyes:  pupils equal and pupils round.   Ears:  R ear normal and L ear normal.   Nose:  no external deformity and no nasal discharge.   Mouth:  Oral mucosa and oropharynx without lesions or exudates.  Teeth in good repair. Lungs:  Normal respiratory effort, chest expands symmetrically. Lungs are clear to auscultation, no crackles or wheezes. Heart:  Normal rate and regular rhythm. S1 and S2 normal without gallop, murmur, click, rub or other extra sounds. Abdomen:  soft, no guarding, and distended.   Msk:  normal ROM.   Extremities:  No clubbing, cyanosis, edema, or deformity noted with normal full range of motion of all joints.   Neurologic:  alert & oriented X3 and gait normal.   Psych:  Oriented X3, subdued, and withdrawn.     Impression & Recommendations:  Problem # 1:  LOW BACK PAIN (ICD-724.2)  Discussed use of moist heat or ice, modified activities, medications, and stretching/strengthening exercises. Back care instructions given. To be seen in 2 weeks if no improvement; sooner if worsening of symptoms.   Complete Medication List: 1)  Treximet 85-500 Mg Tabs (Sumatriptan-naproxen sodium) .... One by mouth as needed migraine 2)  Haloperidol 1 Mg Tabs (Haloperidol) .Marland Kitchen.. 1 in am and 2  at bedtime 3)  Cymbalta 30 Mg Cpep (Duloxetine hcl) .Marland Kitchen.. 1 once daily 4)  Topiramate 100 Mg Tabs (topiramate)  .... One by mouth daily at bedtime  Other Orders: TLB-BMP (Basic Metabolic Panel-BMET) (80048-METABOL) TLB-CBC Platelet - w/Differential (85025-CBCD) TLB-TSH (Thyroid Stimulating Hormone) (84443-TSH) Venipuncture (16109)  Patient Instructions: 1)  Please schedule a follow-up appointment in 4 months.  Appended Document: 3 MONTH ROV/NJR/pt rescd//ccm PT RSC/NJR/PT RSC/CJR

## 2010-03-29 NOTE — Assessment & Plan Note (Signed)
Summary: 2 month rov/njr   Vital Signs:  Patient Profile:   45 Years Old Male Height:     73 inches Weight:      244 pounds Temp:     98.5 degrees F oral Pulse rate:   76 / minute Resp:     14 per minute BP sitting:   122 / 82  (left arm)  Vitals Entered By: Willy Eddy, LPN (September 11, 2007 3:19 PM)                 Chief Complaint:  ROA and Headaches.  History of Present Illness:  Headaches      This is a 45 year old man who presents with Headaches.  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  The patient reports nausea and nasal congestion, but denies vomiting, sweats, tearing of eyes, sinus pain, sinus pressure, photophobia, and phonophobia.  High-risk features (red flags) include vision loss or change.  The patient denies the following high-risk features: neck pain/stiffness, altered mental status, rash, and trauma.  The headaches are precipitated by stress and change in weather.  Prior treatment has included no medication.      Current Allergies: ! ASA   Family History:    Reviewed history from 07/07/2007 and no changes required:       unknown  ( astranged)       Family History of CAD Male 1st degree relative <50  Social History:    Reviewed history from 12/04/2006 and no changes required:       Occupation: worker       Single       Former Smoker    Review of Systems  The patient denies anorexia, fever, weight loss, weight gain, vision loss, decreased hearing, hoarseness, chest pain, syncope, dyspnea on exertion, peripheral edema, prolonged cough, headaches, hemoptysis, abdominal pain, melena, hematochezia, severe indigestion/heartburn, hematuria, incontinence, genital sores, muscle weakness, suspicious skin lesions, transient blindness, difficulty walking, depression, unusual weight change, abnormal bleeding, enlarged lymph nodes, angioedema, and breast masses.     Physical Exam  General:  Well-developed,well-nourished,in no acute distress; alert,appropriate and cooperative throughout examination Head:     Normocephalic and atraumatic without obvious abnormalities. No apparent alopecia or balding. Eyes:     No corneal or conjunctival inflammation noted. EOMI. Perrla. Funduscopic exam benign, without hemorrhages, exudates or papilledema. Vision grossly normal. Ears:     External ear exam shows no significant lesions or deformities.  Otoscopic examination reveals clear canals, tympanic membranes are intact bilaterally without bulging, retraction, inflammation or discharge. Hearing is grossly normal bilaterally. Nose:     L frontal sinus tenderness and R frontal sinus tenderness.   Mouth:     Oral mucosa and oropharynx without lesions or exudates.  Teeth in good repair. Neck:     No deformities, masses, or tenderness noted. Lungs:     Normal respiratory effort, chest expands symmetrically. Lungs are clear to auscultation, no crackles or wheezes. Heart:     Normal rate and regular rhythm. S1 and S2 normal without gallop, murmur, click, rub or other extra sounds. Abdomen:     Bowel sounds positive,abdomen soft and non-tender without masses, organomegaly or hernias noted. Msk:     lumbar lordosis and SI joint tenderness.      Impression & Recommendations:  Problem # 1:  SCHIZOTYPAL PERSONALITY DISORDER (ICD-301.22) difficulty with job stress  Problem # 2:  LOW BACK PAIN (ICD-724.2) chronic low back strainDiscussed use  of moist heat or ice, modified activities, medications, and stretching/strengthening exercises. Back care instructions given. To be seen in 2 weeks if no improvement; sooner if worsening of symptoms.   Problem # 3:  SEIZURE DISORDER (ICD-780.39) improved His updated medication list for this problem includes:    Topamax 100 Mg Tabs (Topiramate) .Marland Kitchen... At bedtime  Labs Reviewed: Hgb: 14.2 (11/27/2006)   Hct: 39.7 (11/27/2006)    SGOT: 30 (11/27/2006)   SGPT:  25 (11/27/2006)      Problem # 4:  HEADACHE (ICD-784.0) new optic migraines His updated medication list for this problem includes:    Treximet 85-500 Mg Tabs (Sumatriptan-naproxen sodium) ..... One by mouth as needed migraine Headache diary reviewed.   Complete Medication List: 1)  Haloperidol 1 Mg Tabs (Haloperidol) .... Three times a day 2)  Topamax 100 Mg Tabs (Topiramate) .... At bedtime 3)  Treximet 85-500 Mg Tabs (Sumatriptan-naproxen sodium) .... One by mouth as needed migraine 4)  [facility] Magnesium 250 Mg Tabs (Magnesium) .... 2 once daily   Patient Instructions: 1)  Please schedule a follow-up appointment in 3 months.  CPX   ]

## 2010-03-29 NOTE — Consult Note (Signed)
Summary: [facility]  [facility]   Imported By: Maryln Gottron 07/28/2007 14:28:12  _____________________________________________________________________  External Attachment:    Type:   Image     Comment:   External Document

## 2010-03-29 NOTE — Progress Notes (Signed)
  Phone Note Call from Patient   Caller: Patient Reason for Call: Refill Medication Summary of Call: pc from patient,  He had a migraine this am, took the samples of treximet 85 mg - 500 mg and it worked like a dream.  He would like more samples, does not have any samples left and has no $ to buy medicine.  Patient phone # [phone]/mae Initial call taken by: Felipa Evener,  September 25, 2006 1:36 PM         Appended Document:  pt notified that we have a few samples for him

## 2010-03-29 NOTE — Assessment & Plan Note (Signed)
Summary: 1 month rov/njr/RSC/CJR   Vital Signs:  Patient profile:   45 year old male Height:      73 inches Weight:      284 pounds BMI:     37.60 Temp:     98.6 degrees F oral Pulse rate:   80 / minute Resp:     14 per minute BP sitting:   132 / 90  (left arm)  Vitals Entered By: Willy Eddy, LPN (August 16, 2008 10:06 AM)  CC:  roa.  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 One By Mouth As Needed Migraine 2)  Clonazepam Odt 0.5 Mg Tbdp (Clonazepam) .Marland Kitchen.. 1 Three Times A Day 3)  Neurontin 300 Mg Caps (Gabapentin) .Marland Kitchen.. 1 Three Times A Day 4)  Haloperidol 1 Mg Tabs (Haloperidol) .Marland Kitchen.. 1 in Am and 2  At Bedtime 5)  Cymbalta 30 Mg Cpep (Duloxetine Hcl) .Marland Kitchen.. 1 Once Daily  Current Medications (verified): 1)  Treximet 85-500 Mg  Tabs (Sumatriptan-Naproxen Sodium) .... One By Mouth As Needed Migraine 2)  Clonazepam Odt 0.5 Mg Tbdp (Clonazepam) .Marland Kitchen.. 1 Three Times A Day Hold 3)  Neurontin 300 Mg Caps (Gabapentin) .Marland Kitchen.. 1 Three Times A Day 4)  Haloperidol 1 Mg Tabs (Haloperidol) .Marland Kitchen.. 1 in Am and 2  At Bedtime 5)  Cymbalta 30 Mg Cpep (Duloxetine Hcl) .Marland Kitchen.. 1 Once Daily  Allergies (verified): 1)  ! Jonne Ply  Past History:  Family History: Last updated: 07/07/2007 unknown  ( astranged) Family History of CAD Male 1st degree relative <50  Social  History: Last updated: 12/04/2006 Occupation: worker Single Former Smoker  Risk Factors: Smoking Status: quit (11/06/2006) Passive Smoke Exposure: no (04/09/2007)  Past medical, surgical, family and social histories (including risk factors) reviewed, and no changes noted (except as noted below).  Past Medical History: Reviewed history from 11/17/2007 and no changes required. Personality D/O Knee Pain Headache Seizure disorder( myoclonus) Migraines Low back pain myoclonus  Past Surgical History: Reviewed history from 11/06/2006 and no changes required. Denies surgical history  Family History: Reviewed history from 07/07/2007 and no changes required. unknown  ( astranged) Family History of CAD Male 1st degree relative <50  Social History: Reviewed history from 12/04/2006 and no changes required. Occupation: worker Single Former Smoker  Review of Systems  The patient denies anorexia, fever, weight loss, weight gain, vision loss, decreased hearing, hoarseness, chest pain, syncope, dyspnea on exertion, peripheral edema, prolonged cough, headaches, hemoptysis, abdominal pain, melena, hematochezia, severe indigestion/heartburn, hematuria, incontinence, genital sores, muscle weakness, suspicious skin lesions, transient blindness, difficulty walking, depression, unusual weight change, abnormal bleeding, enlarged lymph nodes, angioedema, and breast masses.    Physical Exam  General:  alert and overweight-appearing.   Head:  normocephalic and no abnormalities palpated.   Eyes:  vision grossly intact and pupils reactive to light.   Ears:  R ear normal and L ear  normal.   Nose:  L frontal sinus tenderness and R frontal sinus tenderness.   Neck:  No deformities, masses, or tenderness noted. Lungs:  Normal respiratory effort, chest expands symmetrically. Lungs are clear to auscultation, no crackles or wheezes. Heart:  Normal rate and regular rhythm. S1 and S2 normal without gallop,  murmur, click, rub or other extra sounds. Abdomen:  Bowel sounds positive,abdomen soft and non-tender without masses, organomegaly or hernias noted. Msk:  SI joint tenderness and trigger point tenderness.   Pulses:  R and L carotid,radial,femoral,dorsalis pedis and posterior tibial pulses are full and equal bilaterally Extremities:  trace left pedal edema and trace right pedal edema.   Neurologic:  alert & oriented X3.     Impression & Recommendations:  Problem # 1:  REACTIVE HYPOGLYCEMIA (ICD-251.2) discusson about diet and exercize I have spent greater that 30 min face to face evaluating this patient  Problem # 2:  LOW BACK PAIN (ICD-724.2)  Discussed use of moist heat or ice, modified activities, medications, and stretching/strengthening exercises. Back care instructions given. To be seen in 2 weeks if no improvement; sooner if worsening of symptoms.   Problem # 3:  HEADACHE (ICD-784.0)  His updated medication list for this problem includes:    Treximet 85-500 Mg Tabs (Sumatriptan-naproxen sodium) ..... One by mouth as needed migraine  Headache diary reviewed.  Complete Medication List: 1)  Treximet 85-500 Mg Tabs (Sumatriptan-naproxen sodium) .... One by mouth as needed migraine 2)  Clonazepam Odt 0.5 Mg Tbdp (Clonazepam) .Marland Kitchen.. 1 three times a day hold 3)  Neurontin 300 Mg Caps (Gabapentin) .Marland Kitchen.. 1 three times a day 4)  Haloperidol 1 Mg Tabs (Haloperidol) .Marland Kitchen.. 1 in am and 2  at bedtime 5)  Cymbalta 30 Mg Cpep (Duloxetine hcl) .Marland Kitchen.. 1 once daily  Patient Instructions: 1)  Please schedule a follow-up appointment in 2 months.

## 2010-03-29 NOTE — Progress Notes (Signed)
Summary: migraine  Phone Note From Other Clinic   Caller: Patient Call For: Dr. Lovell Sheehan Summary of Call: Pt has had migraine x 4 days and Naprelan, and Maxalt has not helped. [phone] Initial call taken by: Lynann Beaver CMA,  December 23, 2007 10:22 AM Reason for Call: Need Referral Information  Follow-up for Phone Call        per dr Lovell Sheehan- call in # 5 vicophrofen for sever headache-[facility] [address] Follow-up by: Willy Eddy, LPN,  December 23, 2007 11:25 AM    New/Updated Medications: VICOPROFEN 7.5-200 MG TABS (HYDROCODONE-IBUPROFEN) 1 as needed sever headache   Prescriptions: VICOPROFEN 7.5-200 MG TABS (HYDROCODONE-IBUPROFEN) 1 as needed sever headache  #5 x 0   Entered by:   Willy Eddy, LPN   Authorized by:   Stacie Glaze MD   Signed by:   Willy Eddy, LPN on 11/91/4782   Method used:   Telephoned to ...       [id number]       [address]       [place], [place]  [address]       Ph: [phone] or [phone]       Fax: [phone]   RxID:   [id number]

## 2010-03-29 NOTE — Progress Notes (Signed)
Summary: Two ER visits this weekend, FYI  Phone Note Call from Patient Call back at [facility] Phone [phone]   Caller: Patient Call For: Jenkins/Swords Summary of Call: Pt reporting 2 ER visits this weekend, pt began twitching at work in [place] on Saturday, [place] on call physician told him to go to ER.  Pt went to [facility] and was told he could drive home 30 minutes.  Pt had a bad headache Sunday, vision poor, went to [facility] ER and was diagnosed with a migraine, labs, CT Scan and sent home.  Dr Lovell Sheehan referred pt to Neurologist Dr Anne Hahn, and meds have been changed some.  Informed pt Dr Lovell Sheehan out of office this week, recommended pt call Dr Anne Hahn office for OV today.  Pt denies any changes in his med.  Pt does not have insurance currently. Initial call taken by: Sid Falcon LPN,  November 03, 2007 8:19 AM  Follow-up for Phone Call        Patient called and wants to see a doctor for twitching and jerking. Was taken off medication for seizures and is taking a new med.  I put him on with Dr. Kirtland Bouchard in the morning. Follow-up by: Barnie Mort,  November 03, 2007 12:58 PM

## 2010-03-29 NOTE — Progress Notes (Signed)
Summary: Pt req refill of Haloperidol and Cymbalta via [facility]  Phone Note Refill Request Message from:  Patient on August 29, 2009 10:13 AM  Refills Requested: Medication #1:  HALOPERIDOL 1 MG TABS 1 in am and 2  at bedtime   Dosage confirmed as above?Dosage Confirmed   Supply Requested: 1 month  Medication #2:  CYMBALTA 30 MG CPEP 1 once daily   Dosage confirmed as above?Dosage Confirmed   Supply Requested: 1 month [facility] Phone # [phone]   or fax # [phone]   Method Requested: Telephone to Pharmacy Initial call taken by: Lucy Antigua,  August 29, 2009 10:15 AM    Prescriptions: CYMBALTA 30 MG CPEP (DULOXETINE HCL) 1 once daily  #90 x 3   Entered by:   Willy Eddy, LPN   Authorized by:   Stacie Glaze MD   Signed by:   Willy Eddy, LPN on 56/21/3086   Method used:   Faxed to ...       [facility] (retail)       [address]       [place], [place]  [address]       Ph: [phone]       Fax: [phone]   RxID:   [id number] HALOPERIDOL 1 MG TABS (HALOPERIDOL) 1 in am and 2  at bedtime  #270 x 3   Entered by:   Willy Eddy, LPN   Authorized by:   Stacie Glaze MD   Signed by:   Willy Eddy, LPN on 87/56/4332   Method used:   Faxed to ...       [facility] (retail)       [address]       [place], [place]  [address]       Ph: [phone]       Fax: [phone]   RxID:   [id number]

## 2010-03-29 NOTE — Assessment & Plan Note (Signed)
Summary: 2 month rov/njr   Vital Signs:  Patient profile:   45 year old male Height:      73 inches Weight:      278 pounds BMI:     36.81 Temp:     98.2 degrees F oral Pulse rate:   80 / minute Resp:     14 per minute BP sitting:   140 / 80  (left arm) Cuff size:   large  Vitals Entered By: Willy Eddy, LPN (October 12, 2008 9:50 AM)  Nutrition Counseling: Patient's BMI is greater than 25 and therefore counseled on weight management options.  CC:  roa.  History of Present Illness: [employer] surgeon and feels much better is walking and has cutt back on eating  Headache HPI:      The patient comes in for chronic management of stable headaches.  He has approximately 4 headaches per month.        The location of the headaches are unilateral-right.  Headache quality is throbbing or pulsating.        Positive alarm features include change in frequency from prior H/A's.  The patient denies first or worst H/A of life, change in severity from prior H/A's, change in features from prior H/A's, new onset H/A's in middle-age or later, new or progressive H/A lasting days, H/A's with Valsalva (cough/sneeze), mylagia, fever, malaise, weight loss, scalp tenderness, jaw claudication, focal neurologic symptoms, confusion, seizures, and impaired level of consciousness.        Additional history: less frequent.     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 One By Mouth As Needed Migraine 2)  Clonazepam Odt 0.5 Mg Tbdp (Clonazepam) .Marland Kitchen.. 1 Three Times A Day Hold 3)  Neurontin 300 Mg Caps  (Gabapentin) .Marland Kitchen.. 1 Three Times A Day 4)  Haloperidol 1 Mg Tabs (Haloperidol) .Marland Kitchen.. 1 in Am and 2  At Bedtime 5)  Cymbalta 30 Mg Cpep (Duloxetine Hcl) .Marland Kitchen.. 1 Once Daily  Current Medications (verified): 1)  Treximet 85-500 Mg  Tabs (Sumatriptan-Naproxen Sodium) .... One By Mouth As Needed Migraine 2)  Clonazepam Odt 0.5 Mg Tbdp (Clonazepam) .Marland Kitchen.. 1 Three Times A Day Hold 3)  Neurontin 300 Mg Caps (Gabapentin) .Marland Kitchen.. 1 Three Times A Day 4)  Haloperidol 1 Mg Tabs (Haloperidol) .Marland Kitchen.. 1 in Am and 2  At Bedtime 5)  Cymbalta 30 Mg Cpep (Duloxetine Hcl) .Marland Kitchen.. 1 Once Daily  Allergies (verified): 1)  ! Jonne Ply  Past History:  Family History: Last updated: 07/07/2007 unknown  ( astranged) Family History of CAD Male 1st degree relative <50  Social History: Last updated: 12/04/2006 Occupation: worker Single Former Smoker  Risk Factors: Smoking Status: quit (11/06/2006) Passive Smoke Exposure: no (04/09/2007)  Past medical, surgical, family and social histories (including risk factors) reviewed, and no changes noted (except as noted below).  Past Medical History: Reviewed history from 11/17/2007 and no changes required. Personality D/O Knee Pain Headache Seizure disorder( myoclonus) Migraines Low back pain myoclonus  Past Surgical History: Reviewed history from 11/06/2006 and no changes required. Denies surgical history  Family History: Reviewed history from  07/07/2007 and no changes required. unknown  ( astranged) Family History of CAD Male 1st degree relative <50  Social History: Reviewed history from 12/04/2006 and no changes required. Occupation: worker Single Former Smoker  Review of Systems  The patient denies anorexia, fever, weight loss, weight gain, vision loss, decreased hearing, hoarseness, chest pain, syncope, dyspnea on exertion, peripheral edema, prolonged cough, headaches, hemoptysis, abdominal pain, melena, hematochezia, severe indigestion/heartburn, hematuria,  incontinence, genital sores, muscle weakness, suspicious skin lesions, transient blindness, difficulty walking, depression, unusual weight change, abnormal bleeding, enlarged lymph nodes, angioedema, breast masses, and testicular masses.    Physical Exam  General:  alert and overweight-appearing.   Head:  normocephalic and no abnormalities palpated.   Eyes:  vision grossly intact and pupils reactive to light.   Mouth:  Oral mucosa and oropharynx without lesions or exudates.  Teeth in good repair. Lungs:  Normal respiratory effort, chest expands symmetrically. Lungs are clear to auscultation, no crackles or wheezes. Heart:  Normal rate and regular rhythm. S1 and S2 normal without gallop, murmur, click, rub or other extra sounds. Abdomen:  Bowel sounds positive,abdomen soft and non-tender without masses, organomegaly or hernias noted. Msk:  SI joint tenderness and trigger point tenderness.   Extremities:  trace left pedal edema and trace right pedal edema.   Neurologic:  alert & oriented X3.     Impression & Recommendations:  Problem # 1:  SCHIZOTYPAL PERSONALITY DISORDER (ICD-301.22) discussion of need to stay on medications  Problem # 2:  LOW BACK PAIN (ICD-724.2)  discussion of back exercized with stretch stable  Discussed use of moist heat or ice, modified activities, medications, and stretching/strengthening exercises. Back care instructions given. To be seen in 2 weeks if no improvement; sooner if worsening of symptoms.   Orders: TLB-CBC Platelet - w/Differential (85025-CBCD)  Problem # 3:  HEADACHE (ICD-784.0) samples given with medications His updated medication list for this problem includes:    Treximet 85-500 Mg Tabs (Sumatriptan-naproxen sodium) ..... One by mouth as needed migraine  Headache diary reviewed.  Complete Medication List: 1)  Treximet 85-500 Mg Tabs (Sumatriptan-naproxen sodium) .... One by mouth as needed migraine 2)  Clonazepam Odt 0.5 Mg Tbdp  (Clonazepam) .Marland Kitchen.. 1 three times a day hold 3)  Neurontin 300 Mg Caps (Gabapentin) .Marland Kitchen.. 1 three times a day 4)  Haloperidol 1 Mg Tabs (Haloperidol) .Marland Kitchen.. 1 in am and 2  at bedtime 5)  Cymbalta 30 Mg Cpep (Duloxetine hcl) .Marland Kitchen.. 1 once daily  Other Orders: TLB-Hepatic/Liver Function Pnl (80076-HEPATIC) TLB-TSH (Thyroid Stimulating Hormone) (84443-TSH) TLB-BMP (Basic Metabolic Panel-BMET) (80048-METABOL)  Patient Instructions: 1)  Please schedule a follow-up appointment in 3 months.

## 2010-03-29 NOTE — Progress Notes (Signed)
Summary: new rx  Phone Note Call from Patient Call back at [phone]   Caller: pt live Call For: Stacie Glaze MD Reason for Call: Acute Illness Summary of Call: Patient needs a new rx for haloperidol 1mg  3 x a day take 1in am and 2 at night.  call when ready to pick up he needs to send to [facility]. Also need samples of edluar 10mg  for sleep, and something for headche. Initial call taken by: Celine Ahr,  March 29, 2008 11:19 AM  Follow-up for Phone Call        script is ready for pick with samples enclosed Follow-up by: Willy Eddy, LPN,  March 29, 2008 12:34 PM    New/Updated Medications: HALOPERIDOL 1 MG TABS (HALOPERIDOL) 1 in am and 2  at bedtime   Prescriptions: HALOPERIDOL 1 MG TABS (HALOPERIDOL) 1 in am and 2  at bedtime  #270 x 3   Entered by:   Willy Eddy, LPN   Authorized by:   Stacie Glaze MD   Signed by:   Willy Eddy, LPN on 29/56/2130   Method used:   Print then Give to Patient   RxID:   [id number]

## 2010-03-29 NOTE — Assessment & Plan Note (Signed)
Summary: FUP PER PT/NJR   Vital Signs:  Patient Profile:   45 Years Old Male Height:     73 inches Weight:      256 pounds Temp:     98.5 degrees F oral Pulse rate:   80 / minute Resp:     14 per minute BP sitting:   130 / 82  (left arm) Cuff size:   small  Vitals Entered By: Willy Eddy, LPN (December 24, 2007 10:10 AM)                 Chief Complaint:  roa- states vicoprofen helped headache some and Headaches.  History of Present Illness: Severe migraines  not resposive to the relpax and the vicoprofen had both nausia and vomiting with HA and HA has lasted 3 dyas  Headaches      This is a 45 year old man who presents with Headaches.  The patient reports nausea and vomiting, but denies sweats, tearing of eyes, nasal congestion, sinus pain, sinus pressure, photophobia, and phonophobia.  The headache is described as throbbing and stabbing.  The location of the pain is unilateral on the left.  The patient denies the following high-risk features: fever, neck pain/stiffness, vision loss or change, focal weakness, altered mental status, rash, trauma, pain worse with exertion, new type of headache, age >50 years, immunosuppression, concomitant infection, and anticoagulation use.  Prior treatment has included a narcotic, a triptan, and a muscle relaxer.      Current Allergies: ! ASA  Past Medical History:    Reviewed history from 11/17/2007 and no changes required:       Personality D/O       Knee Pain       Headache       Seizure disorder( myoclonus)       Migraines       Low back pain       myoclonus  Past Surgical History:    Reviewed history from 11/06/2006 and no changes required:       Denies surgical history   Family History:    Reviewed history from 07/07/2007 and no changes required:       unknown  ( astranged)       Family History of CAD Male 1st degree relative <50  Social History:    Reviewed history from 12/04/2006 and no changes required:  Occupation: worker       Single       Former Smoker    Review of Systems       The patient complains of headaches and abdominal pain.  The patient denies anorexia, fever, weight loss, weight gain, vision loss, decreased hearing, hoarseness, chest pain, syncope, dyspnea on exertion, peripheral edema, prolonged cough, hemoptysis, melena, hematochezia, severe indigestion/heartburn, hematuria, incontinence, genital sores, muscle weakness, suspicious skin lesions, transient blindness, difficulty walking, depression, unusual weight change, abnormal bleeding, enlarged lymph nodes, angioedema, breast masses, and testicular masses.     Physical Exam  General:     Well-developed,well-nourished,in no acute distress; alert,appropriate and cooperative throughout examinationalert.   Eyes:     No corneal or conjunctival inflammation noted. EOMI. Perrla. Funduscopic exam benign, without hemorrhages, exudates or papilledema. Vision grossly normal. Nose:     L frontal sinus tenderness and R frontal sinus tenderness.   Mouth:     Oral mucosa and oropharynx without lesions or exudates.  Teeth in good repair. Neck:     No deformities, masses, or tenderness noted. Lungs:  Normal respiratory effort, chest expands symmetrically. Lungs are clear to auscultation, no crackles or wheezes. Heart:     Normal rate and regular rhythm. S1 and S2 normal without gallop, murmur, click, rub or other extra sounds. Abdomen:     Bowel sounds positive,abdomen soft and non-tender without masses, organomegaly or hernias noted.    Impression & Recommendations:  Problem # 1:  HEADACHE (ICD-784.0) the neurologist is helping manage the migraines with increaswed dosed of the elavil His updated medication list for this problem includes:    Treximet 85-500 Mg Tabs (Sumatriptan-naproxen sodium) ..... One by mouth as needed migraine    Vicoprofen 7.5-200 Mg Tabs (Hydrocodone-ibuprofen) .Marland Kitchen... 1 as needed sever headache  Headache diary reviewed.   Problem # 2:  SEIZURE DISORDER (ICD-780.39)  His updated medication list for this problem includes:    Clonazepam Odt 0.5 Mg Tbdp (Clonazepam) .Marland Kitchen... 1 three times a day Current Meds:  HALOPERIDOL 1 MG TABS (HALOPERIDOL) three times a day TREXIMET 85-500 MG  TABS (SUMATRIPTAN-NAPROXEN SODIUM) one by mouth as needed migraine [facility] MAGNESIUM 250 MG  TABS (MAGNESIUM) 2 once daily NORTRIPTYLINE HCL 50 MG CAPS (NORTRIPTYLINE HCL) 1 at bedtime CLONAZEPAM ODT 0.5 MG TBDP (CLONAZEPAM) 1 three times a day VICOPROFEN 7.5-200 MG TABS (HYDROCODONE-IBUPROFEN) 1 as needed sever headache   Current Problems:   Had I Q test to help withj SS claim SCHIZOTYPAL PERSONALITY DISORDER (ICD-301.22) FAMILY HISTORY OF CAD MALE 1ST DEGREE RELATIVE <50 (ICD-V17.3) PHYSICAL EXAMINATION (ICD-V70.0) LOW BACK PAIN (ICD-724.2) SEIZURE DISORDER (ICD-780.39) HEADACHE (ICD-784.0)    Labs Reviewed: Hgb: 14.2 (11/27/2006)   Hct: 39.7 (11/27/2006)    SGOT: 30 (11/27/2006)   SGPT: 25 (11/27/2006)      Complete Medication List: 1)  Haloperidol 1 Mg Tabs (Haloperidol) .... Three times a day 2)  Treximet 85-500 Mg Tabs (Sumatriptan-naproxen sodium) .... One by mouth as needed migraine 3)  [facility] Magnesium 250 Mg Tabs (Magnesium) .... 2 once daily 4)  Nortriptyline Hcl 50 Mg Caps (Nortriptyline hcl) .Marland Kitchen.. 1 at bedtime 5)  Clonazepam Odt 0.5 Mg Tbdp (Clonazepam) .Marland Kitchen.. 1 three times a day 6)  Vicoprofen 7.5-200 Mg Tabs (Hydrocodone-ibuprofen) .Marland Kitchen.. 1 as needed sever headache   Patient Instructions: 1)  Please schedule a follow-up appointment in 3 months.   ]

## 2010-03-29 NOTE — Progress Notes (Signed)
Summary: NOTE FOR DR Lovell Sheehan  Phone Note Call from Patient Call back at Home Phone [phone]   Caller: Patient-LIVE CALL Call For: DR Lennon Alstrom Reason for Call: Talk to Doctor Summary of Call: PT JUST CALLED AND COMPLAINS OF BLURRED VISION. HE ALSO STATED THAT HIS HEAD HURTS.  HE WAS CALLING FROM HIS JOB. I  INFORMED DR Lovell Sheehan OF THIS MATTER AND INSTRUCTED ME TO TELL MR Reny TO GO THE ER OT TO WALK IN IMMEDIATELY. PT AGREED TO GO TO WALK IN. HE STATED THAT HE CAN NOT AFFORD  150 DOLLARS TO GO TO THE ER.  Initial call taken by: Warnell Forester,  August 13, 2007 11:43 AM  Follow-up for Phone Call        PT CALLED AGAIN AND STATED THAT HIS BLURRED VISION CLEARED. HEAD STILL HURTS. HE IS STILL AT WORK. HE WILL EAT LUNCH AND TAKE A PILL FOR HIS HEADACHE. HE WILL MONITOR THIS SITUATION AND WILL GO TO AN URGENT CARE IF NEEDED. HE ALSO STATED THAT HE HAD THE SAME SX ON YESTERDAY. Follow-up by: Warnell Forester,  August 13, 2007 11:48 AM

## 2010-03-29 NOTE — Progress Notes (Signed)
Summary: meds for migraine  Phone Note Call from Patient   Caller: Patient Call For: Stacie Glaze MD Reason for Call: Acute Illness Summary of Call: Pt is calling with a severe migraine, and wants to ask Dr Lovell Sheehan for samples of Maxalt. He does not have any meds to take at all for headache. [phone] Initial call taken by: Lynann Beaver CMA,  August 03, 2008 10:13 AM  Follow-up for Phone Call        samples out frnt- opt informed Follow-up by: Willy Eddy, LPN,  August 03, 2128 1:19 PM

## 2010-03-29 NOTE — Progress Notes (Signed)
Summary: #1 work note, #2 what can be done for leg problems  Phone Note Call from Patient Call back at Home Phone [phone]   Caller: Patient Call For: Lovell Sheehan Summary of Call: pt needing a dr's  note. He's states he's been out of work due to  [employer]) Also wanting Dr Lovell Sheehan to give some advice on what to do regarding this problem. Pls call   Initial call taken by: Shan Levans,  November 19, 2006 1:30 PM  Follow-up for Phone Call        Called pt, he states "he forgot to mention this problem at his 9/15 OV.  He is scueduled for F/U visit in OCT.  Pt states he missed work today due to [employer]" issues and his employer needs a note to RTW tomorrow. Pt will pick up note at office. "What can I do for this problem" Follow-up by: Sid Falcon LPN,  November 19, 2006 2:05 PM  Additional Follow-up for Phone Call Additional follow up Details #1::        Tell Steven Vincent that I do not want him to loose this job, he has to work! Get OTC magnesium  and take it twice daily!  Work note is OK. Additional Follow-up by: Stacie Glaze MD,  November 20, 2006 8:23 AM    Additional Follow-up for Phone Call Additional follow up Details #2::    Work note provided, off work Wed. 9/24, RTW Thursday, 9/25. Pt informed he was instructed to take Magnesium two times daily. Pt voiced his understanding.  Pt did report to work today and willl pick up note after work today at [employer]. Follow-up by: Sid Falcon LPN,  November 20, 2006 9:24 AM

## 2010-03-29 NOTE — Progress Notes (Signed)
Summary: red face  Phone Note Call from Patient   Caller: Patient Call For: Stacie Glaze MD Summary of Call: Pt was reading and face turned red x 2 within 15 minutes.  He is concerned.  This happened last Thursday. Wants lab results. [phone] Initial call taken by: Lynann Beaver CMA,  October 16, 2009 11:38 AM  Follow-up for Phone Call        lab work was fine, could be a food allergy try to note what he might have ate rpior to the reaction Follow-up by: Stacie Glaze MD,  October 17, 2009 9:39 AM  Additional Follow-up for Phone Call Additional follow up Details #1::        Pt. notified. Additional Follow-up by: Lynann Beaver CMA,  October 17, 2009 3:26 PM

## 2010-03-29 NOTE — Assessment & Plan Note (Signed)
Summary: may need med changes/bmw   Vital Signs:  Patient profile:   45 year old male Weight:      274 pounds O2 Sat:      99 % on Room air Temp:     98.2 degrees F oral Pulse rate:   89 / minute BP sitting:   120 / 80  (left arm) Cuff size:   large  Vitals Entered By: Romualdo Bolk, CMA (AAMA) (February 09, 2010 8:34 AM)  O2 Flow:  Room air CC: Discuss outburst and anger issues. Pt also needs to change cymbalta to a generic.   CC:  Discuss outburst and anger issues. Pt also needs to change cymbalta to a generic.Marland Kitchen  History of Present Illness: Pt presents to clinic for discussion of medication. States typically takes cymbalta 30mg  once daily with h/o schizotypal personality d/o per chart review. States he has intermittent anger issue but appears not to have resulted in violence or legal issues. Pt concerned over cost of medication and needs generic equivalent available through [facility]. No other complaints. Compliant with medication without adverse effect.  Preventive Screening-Counseling & Management  Alcohol-Tobacco     Smoking Status: quit     Passive Smoke Exposure: no  Current Medications (verified): 1)  Treximet 85-500 Mg  Tabs (Sumatriptan-Naproxen Sodium) .... One By Mouth As Needed Migraine 2)  Haloperidol 1 Mg Tabs (Haloperidol) .Marland Kitchen.. 1 in Am and 2  At Bedtime 3)  Cymbalta 30 Mg Cpep (Duloxetine Hcl) .Marland Kitchen.. 1 Once Daily 4)  Topiramate 100 Mg Tabs (Topiramate) .... One By Mouth Daily At Bedtime  Allergies (verified): 1)  ! Asa  Review of Systems Psych:  Complains of easily angered and irritability; denies alternate hallucination ( auditory/visual), suicidal thoughts/plans, and thoughts /plans of harming others.  Physical Exam  General:  Well-developed,well-nourished,in no acute distress; alert,appropriate and cooperative throughout examination Head:  Normocephalic and atraumatic without obvious abnormalities. No apparent alopecia or balding. Psych:  Oriented  X3, normally interactive, good eye contact, not anxious appearing, not depressed appearing, and not agitated.     Impression & Recommendations:  Problem # 1:  SCHIZOTYPAL PERSONALITY DISORDER (ICD-301.22) Assessment Unchanged Initially discussed change of cymbalta to celexa for cost consideration however [facility] later contacted. Indicates cymbalta will be covered. Recommend continue cymbalta but increase dose to 60mg  by mouth once daily. Followup with pmd as scheduled or sooner if no improvement or worsening. And in the and a with a  Complete Medication List: 1)  Treximet 85-500 Mg Tabs (Sumatriptan-naproxen sodium) .... One by mouth as needed migraine 2)  Haloperidol 1 Mg Tabs (Haloperidol) .Marland Kitchen.. 1 in am and 2  at bedtime 3)  Celexa 20 Mg Tabs (Citalopram hydrobromide) .... One by mouth qd 4)  Topiramate 100 Mg Tabs (topiramate)  .... One by mouth daily at bedtime Prescriptions: CELEXA 20 MG TABS (CITALOPRAM HYDROBROMIDE) one by mouth qd  #30 x 6   Entered and Authorized by:   Edwyna Perfect MD   Signed by:   Edwyna Perfect MD on 02/09/2010   Method used:   Faxed to ...       [facility] (retail)             , [place]         Ph: [phone]       Fax: [phone]   RxID:   [id number]    Orders Added: 1)  Est. Patient Level III [62952]

## 2010-03-29 NOTE — Progress Notes (Signed)
Summary: samples  Phone Note Call from Patient   Caller: Patient Call For: Dr. Lovell Sheehan Summary of Call: Pt would like samples of Zomig or Imitrex....... [phone] Initial call taken by: Lynann Beaver CMA,  August 04, 2007 8:51 AM  Follow-up for Phone Call        pt notified a few samples up front Follow-up by: Willy Eddy, LPN,  August 03, 2128 9:00 AM

## 2010-03-29 NOTE — Assessment & Plan Note (Signed)
Summary: 3 month rov/njr   Vital Signs:  Patient Profile:   45 Years Old Male Height:     73 inches Weight:      248 pounds Temp:     98.1 degrees F oral Pulse rate:   76 / minute Resp:     14 per minute BP sitting:   116 / 80  (left arm)  Vitals Entered By: Willy Eddy, LPN (Jul 07, 2007 2:55 PM)                 Chief Complaint:  ROA- SAW DR Anne Hahn AND HE CHANGED TEGRETOL TO TOPAMAX AND STOPPED LOPRESSOR STATING HTN WAS A SIDE EFFECT OF TEGREGOL.    Current Allergies: ! ASA  Past Medical History:    Reviewed history from 11/06/2006 and no changes required:       Personality D/O       Knee Pain       Headache       Seizure disorder       Migraines       Low back pain  Past Surgical History:    Reviewed history from 11/06/2006 and no changes required:       Denies surgical history   Family History:    Reviewed history from 12/04/2006 and no changes required:       unknown  ( astranged)       Family History of CAD Male 1st degree relative <50  Social History:    Reviewed history from 12/04/2006 and no changes required:       Occupation: worker       Single       Former Smoker    Review of Systems       The patient complains of muscle weakness and difficulty walking.  The patient denies anorexia, fever, weight loss, weight gain, vision loss, decreased hearing, hoarseness, chest pain, syncope, dyspnea on exhertion, peripheral edema, prolonged cough, hemoptysis, abdominal pain, melena, hematochezia, severe indigestion/heartburn, hematuria, incontinence, genital sores, suspicious skin lesions, transient blindness, depression, unusual weight change, abnormal bleeding, enlarged lymph nodes, angioedema, breast masses, and testicular masses.     Physical Exam  General:     Well-developed,well-nourished,in no acute distress; alert,appropriate and cooperative throughout examination Eyes:     pupils equal and pupils round.   Mouth:     Oral mucosa and  oropharynx without lesions or exudates.  Teeth in good repair. Neck:     No deformities, masses, or tenderness noted. Lungs:     Normal respiratory effort, chest expands symmetrically. Lungs are clear to auscultation, no crackles or wheezes. Heart:     Normal rate and regular rhythm. S1 and S2 normal without gallop, murmur, click, rub or other extra sounds. Abdomen:     Bowel sounds positive,abdomen soft and non-tender without masses, organomegaly or hernias noted. Msk:     lumbar lordosis and SI joint tenderness.   Pulses:     R and L carotid,radial,femoral,dorsalis pedis and posterior tibial pulses are full and equal bilaterally Extremities:     No clubbing, cyanosis, edema, or deformity noted with normal full range of motion of all joints.   Neurologic:     alert & oriented X3 and abnormal gait.      Impression & Recommendations:  Problem # 1:  LOW BACK PAIN (ICD-724.2) know disc dz, radicular pain thta is worsened with lifting, has refused surgical fusion at this time, wishes for conservative therapy The following medications were removed from  the medication list:    Skelaxin 800 Mg Tabs (Metaxalone) ..... One by mouth at bed time this diagnosis impacts job performace in terms of indurance and lifting capcity ( nothing greater that 25 pounds is recomended)Discussed use of moist heat or ice, modified activities, medications, and stretching/strengthening exercises. Back care instructions given. To be seen in 2 weeks if no improvement; sooner if worsening of symptoms.   Problem # 2:  SEIZURE DISORDER (ICD-780.39)  His updated medication list for this problem includes:    Topamax 100 Mg Tabs (Topiramate) .Marland Kitchen... At bedtime  added by the neurologist for increased movement disorder in addition to the haldol  Labs Reviewed: Hgb: 14.2 (11/27/2006)   Hct: 39.7 (11/27/2006)    SGOT: 30 (11/27/2006)   SGPT: 25 (11/27/2006)     His updated medication list for this problem includes:     Topamax 100 Mg Tabs (Topiramate) .Marland Kitchen... At bedtime   Problem # 3:  SCHIZOTYPAL PERSONALITY DISORDER (ICD-301.22) impulse and anger control that has effected job retention and preformance. on haldol with fair control   Problem # 4:  HEADACHE (ICD-784.0) migrain head aches withb unpredictable pattern caussing issed work The following medications were removed from the medication list:    Lopressor 100 Mg Tabs (Metoprolol tartrate) .Marland Kitchen... 1/2 two times a day  His updated medication list for this problem includes:    Treximet 85-500 Mg Tabs (Sumatriptan-naproxen sodium) ..... One by mouth as needed migraine Headache diary reviewed.   Complete Medication List: 1)  Haloperidol 1 Mg Tabs (Haloperidol) .... Three times a day 2)  Topamax 100 Mg Tabs (Topiramate) .... At bedtime 3)  Treximet 85-500 Mg Tabs (Sumatriptan-naproxen sodium) .... One by mouth as needed migraine   Patient Instructions: 1)  Please schedule a follow-up appointment in 2 months.   ]

## 2010-03-29 NOTE — Assessment & Plan Note (Signed)
Summary: MED CK (CONCERNS W/ MEDS) // RS   Vital Signs:  Patient profile:   45 year old male Height:      73 inches Weight:      285 pounds BMI:     37.74 Temp:     98.2 degrees F oral Pulse rate:   80 / minute Resp:     14 per minute BP sitting:   132 / 80  (left arm) Cuff size:   large  Vitals Entered By: Willy Eddy, LPN (March 21, 2009 10:05 AM) CC: c/o forgetfulness, Headache   CC:  c/o forgetfulness and Headache.  Headache HPI:      The patient comes in for chronic management of headaches which have been unstable.  Since the last visit, the frequency of headaches have increased, but the intensity of the headaches have not changed.  The headaches will last anywhere from 2 hours to 3 days at a time.  He has approximately 5+ headaches per month.  The patient is right handed.  There is no family history of migraine headaches.        The location of the headaches are unilateral-right.  Headache quality is throbbing or pulsating.        Positive alarm features include change in frequency from prior H/A's.        Preventive Screening-Counseling & Management  Alcohol-Tobacco     Smoking Status: quit     Passive Smoke Exposure: no  Problems Prior to Update: 1)  Reactive Hypoglycemia  (ICD-251.2) 2)  Weight Gain  (ICD-783.1) 3)  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 One By Mouth As Needed Migraine 2)  Clonazepam Odt 0.5 Mg Tbdp (Clonazepam) .Marland Kitchen.. 1 Three Times A Day Hold 3)  Neurontin 300 Mg Caps (Gabapentin) .Marland Kitchen.. 1 Three Times A Day 4)  Haloperidol 1 Mg Tabs (Haloperidol) .Marland Kitchen.. 1 in Am and 2  At Bedtime 5)  Cymbalta 30 Mg Cpep (Duloxetine Hcl) .Marland Kitchen.. 1 Once Daily  Current  Medications (verified): 1)  Treximet 85-500 Mg  Tabs (Sumatriptan-Naproxen Sodium) .... One By Mouth As Needed Migraine 2)  Clonazepam Odt 0.5 Mg Tbdp (Clonazepam) .Marland Kitchen.. 1 Three Times A Day Hold 3)  Neurontin 300 Mg Caps (Gabapentin) .Marland Kitchen.. 1 Three Times A Day 4)  Haloperidol 1 Mg Tabs (Haloperidol) .Marland Kitchen.. 1 in Am and 2  At Bedtime 5)  Cymbalta 30 Mg Cpep (Duloxetine Hcl) .Marland Kitchen.. 1 Once Daily 6)  Topiramate 25 Mg Tabs (Topiramate) .... One By Mouth Daily  Allergies (verified): 1)  ! Jonne Ply  Past History:  Past Medical History: Last updated: 11/17/2007 Personality D/O Knee Pain Headache Seizure disorder( myoclonus) Migraines Low back pain myoclonus  Past Surgical History: Last updated: 11/06/2006 Denies surgical history  Family History: Last updated: 07/07/2007 unknown  ( astranged) Family History of CAD Male 1st degree relative <50  Social History: Last updated: 12/04/2006 Occupation: worker Single Former Smoker  Risk Factors: Smoking Status: quit (03/21/2009) Passive Smoke Exposure: no (03/21/2009)  Review of Systems       The patient complains of weight gain.  The patient denies anorexia, fever, weight loss, vision loss, decreased hearing, hoarseness, chest pain, syncope, dyspnea on exertion, peripheral edema, prolonged cough,  headaches, hemoptysis, abdominal pain, melena, hematochezia, severe indigestion/heartburn, hematuria, incontinence, genital sores, muscle weakness, suspicious skin lesions, transient blindness, difficulty walking, depression, unusual weight change, abnormal bleeding, enlarged lymph nodes, angioedema, breast masses, and testicular masses.    Physical Exam  General:  alert and overweight-appearing.   Lungs:  Normal respiratory effort, chest expands symmetrically. Lungs are clear to auscultation, no crackles or wheezes. Heart:  Normal rate and regular rhythm. S1 and S2 normal without gallop, murmur, click, rub or other extra sounds.   Impression &  Recommendations:  Problem # 1:  WEIGHT GAIN (ICD-783.1) DUE TO MEDS AND DIET  Problem # 2:  SCHIZOTYPAL PERSONALITY DISORDER (ICD-301.22) NEEDS  TO CONTINUE, PT WANTED TO STOP MEDS  Problem # 3:  LOW BACK PAIN (ICD-724.2)  Discussed use of moist heat or ice, modified activities, medications, and stretching/strengthening exercises. Back care instructions given. To be seen in 2 weeks if no improvement; sooner if worsening of symptoms.   Problem # 4:  HEADACHE (ICD-784.0)  His updated medication list for this problem includes:    Treximet 85-500 Mg Tabs (Sumatriptan-naproxen sodium) ..... One by mouth as needed migraine  Complete Medication List: 1)  Treximet 85-500 Mg Tabs (Sumatriptan-naproxen sodium) .... One by mouth as needed migraine 2)  Clonazepam Odt 0.5 Mg Tbdp (Clonazepam) .Marland Kitchen.. 1 three times a day hold 3)  Neurontin 300 Mg Caps (Gabapentin) .Marland Kitchen.. 1 three times a day 4)  Haloperidol 1 Mg Tabs (Haloperidol) .Marland Kitchen.. 1 in am and 2  at bedtime 5)  Cymbalta 30 Mg Cpep (Duloxetine hcl) .Marland Kitchen.. 1 once daily 6)  Topiramate 25 Mg Tabs (Topiramate) .... One by mouth daily  Patient Instructions: 1)  Please schedule a follow-up appointment in 3 months.

## 2010-03-29 NOTE — Progress Notes (Signed)
Summary: question about Metoprolol & Haldol change  Phone Note Call from Patient Call back at Home Phone [phone]   Caller: pt vm triage Call For: Lovell Sheehan Reason for Call: Lab or Test Results Summary of Call: Went to the nureologist as requestted/  He changed the meds.  Took off Tegretal slowly and replaced with topiramate 25mg  take 1 at bedtime for a week then next week increase by two and then by 3 all at bedtime.  Haldol he takes 3 a day.  Does Dr Lovell Sheehan want him to leave his Haldol as it is or change.   Initial call taken by: Roselle Locus,  June 11, 2007 11:25 AM  Follow-up for Phone Call        Reviewed and forwarded to Dr Lovell Sheehan ..................................................................Marland KitchenSid Falcon LPN  June 11, 2007 12:09 PM   Additional Follow-up for Phone Call Additional follow up Details #1::        make the change per the neurologist Additional Follow-up by: Stacie Glaze MD,  June 11, 2007 12:43 PM    Additional Follow-up for Phone Call Additional follow up Details #2::    Wife  informed.  Wife now asking if he is to continue on the Metoprolol 1/2 two times a day now that he is off the Tegretol?  She explained he was not on this med for HBP but because of the Tegretal med. ..................................................................Marland KitchenSid Falcon LPN  June 11, 2007 1:49 PM   Additional Follow-up for Phone Call Additional follow up Details #3:: Details for Additional Follow-up Action Taken: message given to wife for pt to stay on all meds he is currently on until his appointment on may 12 and he wil discuss any changes then,. Additional Follow-up by: Willy Eddy, LPN,  June 11, 2007 2:08 PM

## 2010-03-29 NOTE — Progress Notes (Signed)
Summary: refill  Phone Note Call from Patient Call back at Home Phone [phone]   Caller: pt live Call For: Lovell Sheehan Summary of Call: he gets his meds every three months except for haloperidol 1mg .  Could he get a 3 month rx for this one as well. [facility]  Initial call taken by: Roselle Locus,  September 22, 2007 3:33 PM      Prescriptions: HALOPERIDOL 1 MG TABS (HALOPERIDOL) three times a day  #270 x 3   Entered by:   Willy Eddy, LPN   Authorized by:   Stacie Glaze MD   Signed by:   Willy Eddy, LPN on 96/29/5284   Method used:   [facility] to ...       [id number]       [address]       [place], [place]  [address]       Ph: [phone] or [phone]       Fax: [phone]   RxID:   [id number]

## 2010-03-29 NOTE — Assessment & Plan Note (Signed)
Summary: 3 MNTH ROV//SLM   Vital Signs:  Patient profile:   45 year old male Height:      73 inches Weight:      284 pounds BMI:     37.60 Temp:     98.2 degrees F oral Pulse rate:   80 / minute Resp:     14 per minute BP sitting:   134 / 80  (left arm) Cuff size:   large  Vitals Entered By: Willy Eddy, LPN (January 11, 2009 11:20 AM)  Nutrition Counseling: Patient's BMI is greater than 25 and therefore counseled on weight management options.  CC:  roa.  History of Present Illness: Having pain in hand with grip that bhurts worse with opening the hand no numbness no loss of strenth no sticking of finger Head hx is better with only two head aches this month! 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 One By Mouth As Needed Migraine 2)  Clonazepam Odt 0.5 Mg Tbdp (Clonazepam) .Marland Kitchen.. 1 Three Times A Day Hold 3)  Neurontin 300 Mg Caps (Gabapentin) .Marland Kitchen.. 1 Three Times A Day 4)  Haloperidol 1 Mg Tabs (Haloperidol) .Marland Kitchen.. 1 in Am and 2  At Bedtime 5)  Cymbalta 30 Mg Cpep (Duloxetine Hcl) .Marland Kitchen.. 1 Once Daily  Current Medications (verified): 1)  Treximet 85-500 Mg  Tabs (Sumatriptan-Naproxen Sodium) .... One By Mouth As Needed Migraine 2)  Clonazepam Odt 0.5 Mg Tbdp (Clonazepam) .Marland Kitchen.. 1 Three Times A Day Hold 3)  Neurontin 300 Mg Caps (Gabapentin) .Marland Kitchen.. 1 Three Times A Day 4)  Haloperidol 1 Mg Tabs (Haloperidol) .Marland Kitchen.. 1 in Am and 2  At Bedtime 5)  Cymbalta 30 Mg Cpep (Duloxetine Hcl) .Marland Kitchen.. 1 Once Daily   Allergies (verified): 1)  ! Jonne Ply  Past History:  Family History: Last updated: 07/07/2007 unknown  ( astranged) Family History of CAD Male 1st degree relative <50  Social History: Last updated: 12/04/2006 Occupation: worker Single Former Smoker  Risk Factors: Smoking Status: quit (11/06/2006) Passive Smoke Exposure: no (04/09/2007)  Past medical, surgical, family and social histories (including risk factors) reviewed, and no changes noted (except as noted below).  Past Medical History: Reviewed history from 11/17/2007 and no changes required. Personality D/O Knee Pain Headache Seizure disorder( myoclonus) Migraines Low back pain myoclonus  Past Surgical History: Reviewed history from 11/06/2006 and no changes required. Denies surgical history  Family History: Reviewed history from 07/07/2007 and no changes required. unknown  ( astranged) Family History of CAD Male 1st degree relative <50  Social History: Reviewed history from 12/04/2006 and no changes required. Occupation: worker Single Former Smoker  Review of Systems  The patient denies anorexia, fever, weight loss, weight gain, vision loss, decreased hearing, hoarseness, chest pain, syncope, dyspnea on exertion, peripheral edema, prolonged cough, headaches, hemoptysis, abdominal pain, melena, hematochezia, severe indigestion/heartburn, hematuria, incontinence, genital sores, muscle weakness, suspicious skin lesions, transient blindness, difficulty walking, depression, unusual weight change, abnormal bleeding, enlarged lymph nodes, angioedema, breast masses, and testicular masses.  Flu Vaccine Consent Questions     Do you have a history of severe allergic reactions to this vaccine? no    Any prior history of allergic reactions to egg and/or gelatin? no    Do you have a sensitivity to the preservative Thimersol? no    Do you have a past history of Guillan-Barre Syndrome? no    Do you currently have an  acute febrile illness? no    Have you ever had a severe reaction to latex? no    Vaccine information given and explained to patient? yes    Are you currently pregnant? no    Lot Number:[id number]   Exp Date:08/24/2009   Site Given  Left Deltoid IM   Physical Exam  General:  alert and overweight-appearing.   Head:  normocephalic, atraumatic, and male-pattern balding.   Eyes:  pupils equal and pupils round.   Ears:  R ear normal and L ear normal.   Nose:  no external deformity and no nasal discharge.   Lungs:  Normal respiratory effort, chest expands symmetrically. Lungs are clear to auscultation, no crackles or wheezes. Heart:  Normal rate and regular rhythm. S1 and S2 normal without gallop, murmur, click, rub or other extra sounds. Abdomen:  soft, no guarding, and distended.   Msk:  normal ROM.   Extremities:  No clubbing, cyanosis, edema, or deformity noted with normal full range of motion of all joints.   Neurologic:  alert & oriented X3 and gait normal.     Impression & Recommendations:  Problem # 1:  WEIGHT GAIN (ICD-783.1) Assessment Unchanged no obese with risk of DM  DASH diet and weight loss discussed for over 30 min  Problem # 2:  HEADACHE (ICD-784.0)  stable on meds with less head aches recently His updated medication list for this problem includes:    Treximet 85-500 Mg Tabs (Sumatriptan-naproxen sodium) ..... One by mouth as needed migraine  Headache diary reviewed.  Problem # 3:  SEIZURE DISORDER (ICD-780.39)  stable His updated medication list for this problem includes:    Clonazepam Odt 0.5 Mg Tbdp (Clonazepam) .Marland Kitchen... 1 three times a day hold    Neurontin 300 Mg Caps (Gabapentin) .Marland Kitchen... 1 three times a day  Labs Reviewed: Hgb: 15.6 (10/12/2008)   Hct: 44.0 (10/12/2008)   WBC: 5.8 (10/12/2008)   RBC: 4.69 (10/12/2008)   Plts: 155.0 (10/12/2008) SGOT: 42 (10/12/2008)   SGPT: 51 (10/12/2008)     Complete Medication List: 1)  Treximet 85-500 Mg Tabs  (Sumatriptan-naproxen sodium) .... One by mouth as needed migraine 2)  Clonazepam Odt 0.5 Mg Tbdp (Clonazepam) .Marland Kitchen.. 1 three times a day hold 3)  Neurontin 300 Mg Caps (Gabapentin) .Marland Kitchen.. 1 three times a day 4)  Haloperidol 1 Mg Tabs (Haloperidol) .Marland Kitchen.. 1 in am and 2  at bedtime 5)  Cymbalta 30 Mg Cpep (Duloxetine hcl) .Marland Kitchen.. 1 once daily  Other Orders: Admin 1st Vaccine (16109) Flu Vaccine 68yrs + (60454)  Patient Instructions: 1)  Please schedule a follow-up appointment in 3 months.

## 2010-03-29 NOTE — Progress Notes (Signed)
Summary: MUST CALL MED ASSIST TO GET HIS HALIPERIDOL REINSTATED   Phone Note Call from Patient Call back at Home Phone [phone]   Caller: PT LIVE Call For: Anabel Lykins Summary of Call: DR Lovell Sheehan MUST CALL THEM [phone] MED ASSIST AND SAY [place] AND ADVISE THEM THE IS IS ON HALIPERIDAL OR BECAUSE THEY WERE TODL HE WAS NOT THEY WILL NOT FILL IT.   Initial call taken by: Roselle Locus,  July 26, 2008 10:48 AM  Follow-up for Phone Call        per drjenkins- he is on haldol 1 mg 1 in am and 2 ast bedtim- med assist called and med called in- pt informed Follow-up by: Willy Eddy, LPN,  July 26, 9560 11:03 AM

## 2010-03-29 NOTE — Progress Notes (Signed)
Summary: was told to take benadryl yesterday   Phone Note Call from Patient Call back at Home Phone [phone]   Caller: patient vm triage Call For: Steven Vincent Summary of Call: called yesterday and was told to take Benadryl.  It kept him up all night and he still has a headache.  He said to talk to his wife [name] as he is not at the phone today.   Initial call taken by: Roselle Locus,  April 30, 2007 2:05 PM  Follow-up for Phone Call        had the other symptoms stpped if so take tylenol for the head ache and resume his tegretol as before Follow-up by: Stacie Glaze MD,  April 30, 2007 3:16 PM

## 2010-03-29 NOTE — Assessment & Plan Note (Signed)
Summary: Steven Vincent   Vital Signs:  Patient Profile:   45 Years Old Male Height:     73 inches Weight:      264 pounds Temp:     98.4 degrees F oral Pulse rate:   72 / minute Resp:     14 per minute BP sitting:   110 / 80  (left arm)  Vitals Entered By: Willy Eddy, LPN (November 10, 2006 2:29 PM)                 Chief Complaint:  roa.  History of Present Illness: Complains of being hot all the time. Sweats all the time. Back is stable no acute pain. Migraine head aches have decreased with better working environment. Pt has increased stressors    Current Allergies (reviewed today): ! ASA Updated/Current Medications (including changes made in today's visit):  HALOPERIDOL 1 MG TABS (HALOPERIDOL) three times a day TEGRETOL 200 MG  TABS (CARBAMAZEPINE) 4 times a day LOPRESSOR 100 MG  TABS (METOPROLOL TARTRATE) 1/2 two times a day TREXIMET 85-500 MG  TABS (SUMATRIPTAN-NAPROXEN SODIUM) one by mouth as needed migraine   Past Medical History:    Reviewed history from 11/06/2006 and no changes required:       Personality D/O       Knee Pain       Headache       Seizure disorder       Migraines       Low back pain  Past Surgical History:    Reviewed history from 11/06/2006 and no changes required:       Denies surgical history   Family History:    Reviewed history and no changes required:       unknown  Social History:    Reviewed history from 11/06/2006 and no changes required:       Occupation:       Single       Former Smoker    Review of Systems  The patient denies fever, weight loss, vision loss, decreased hearing, chest pain, syncope, dyspnea on exhertion, peripheral edema, prolonged cough, hemoptysis, abdominal pain, melena, hematochezia, and severe indigestion/heartburn.     Physical Exam  General:     well-developed, well-nourished, and overweight-appearing.   Head:     normocephalic.   Eyes:     pupils equal and pupils round.    Ears:     R ear normal and L ear normal.   Nose:     no nasal discharge.   Mouth:     pharynx pink and moist.   Neck:     supple.   Lungs:     normal respiratory effort and no wheezes.   Heart:     normal rate and regular rhythm.   Abdomen:     soft, non-tender, and normal bowel sounds.   Msk:     normal ROM, no joint warmth, no redness over joints, and no joint deformities.   Pulses:     R and L carotid,radial,femoral,dorsalis pedis and posterior tibial pulses are full and equal bilaterally Extremities:     No clubbing, cyanosis, edema, or deformity noted with normal full range of motion of all joints.   Neurologic:     alert & oriented X3 and gait normal.   Psych:     slightly anxious, easily distracted, and poor concentration.      Impression & Recommendations:  Problem # 1:  LOW BACK PAIN (ICD-724.2) Discussed use  of moist heat or ice, modified activities, medications, and stretching/strengthening exercises. Back care instructions given. To be seen in 2 weeks if no improvement; sooner if worsening of symptoms.   Problem # 2:  SEIZURE DISORDER (ICD-780.39) reports no new sz The following medications were removed from the medication list:    Epitol 200 Mg Tabs (Carbamazepine)    Klonopin 0.5 Mg Tabs (Clonazepam)  His updated medication list for this problem includes:    Tegretol 200 Mg Tabs (Carbamazepine) .Marland KitchenMarland KitchenMarland KitchenMarland Kitchen 4 times a day  Labs Reviewed: SGOT: 31 (12/27/2005)   SGPT: 34 (12/27/2005)      Problem # 3:  HEADACHE (ICD-784.0)  His updated medication list for this problem includes:    Lopressor 100 Mg Tabs (Metoprolol tartrate) .Marland Kitchen... 1/2 two times a day    Treximet 85-500 Mg Tabs (Sumatriptan-naproxen sodium) ..... One by mouth as needed migraine Headache diary reviewed.    Complete Medication List: 1)  Haloperidol 1 Mg Tabs (Haloperidol) .... Three times a day 2)  Tegretol 200 Mg Tabs (Carbamazepine) .... 4 times a day 3)  Lopressor 100 Mg Tabs (Metoprolol  tartrate) .... 1/2 two times a day 4)  Treximet 85-500 Mg Tabs (Sumatriptan-naproxen sodium) .... One by mouth as needed migraine   Patient Instructions: 1)  Please schedule a follow-up appointment in 2 months.    Prescriptions: LOPRESSOR 100 MG  TABS (METOPROLOL TARTRATE) 1/2 two times a day  #30 x 6   Entered and Authorized by:   Stacie Glaze MD   Signed by:   Stacie Glaze MD on 11/10/2006   Method used:   Print then Give to Patient   RxID:   [id number] HALOPERIDOL 1 MG TABS (HALOPERIDOL) three times a day  #90 x 6   Entered and Authorized by:   Stacie Glaze MD   Signed by:   Stacie Glaze MD on 11/10/2006   Method used:   Print then Give to Patient   RxID:   [id number] TEGRETOL 200 MG  TABS (CARBAMAZEPINE) 4 times a day  #120 x 6   Entered and Authorized by:   Stacie Glaze MD   Signed by:   Stacie Glaze MD on 11/10/2006   Method used:   Print then Give to Patient   RxID:   [id number]  ][Headache Q&E-CCC]

## 2010-03-29 NOTE — Progress Notes (Signed)
Summary: Pt req change of Cymbalta med, before ov in March.  Phone Note Call from Patient Call back at [facility] Phone [phone]   Caller: Patient Reason for Call: Acute Illness Summary of Call: Pt called and said that if he waits to discuss changing Cymbalta 60mg  during ov in March, pt will be out of med for a month. Pt says that he needs to be tapered off of med and put on something else.  Pls call pt to discuss of call in alt med to [facility].  Initial call taken by: Lucy Antigua,  March 06, 2010 2:33 PM  Follow-up for Phone Call        again he is told will discuss at [facility] on march1 Follow-up by: Willy Eddy, LPN,  March 06, 2010 3:00 PM

## 2010-03-29 NOTE — Progress Notes (Signed)
Summary: weak  Phone Note Call from Patient   Caller: Patient Call For: Stacie Glaze MD Summary of Call: Pt. wants Dr. Lovell Sheehan to know he was mowing a lawn today, and got very weak and shakey.  He had something to eat and drink, but still does not feel well. [phone] Initial call taken by: Lynann Beaver CMA,  June 10, 2008 3:25 PM  Follow-up for Phone Call        pt reassred Follow-up by: Willy Eddy, LPN,  June 10, 2008 3:43 PM

## 2010-03-29 NOTE — Progress Notes (Signed)
Summary: sinus infection  Phone Note Call from Patient Call back at Home Phone [phone]   Caller: patient wife live Call For: Lovell Sheehan Summary of Call: Patient has an appt for 2-12 @ 1:45 for a 4 month follow up.  In the meantime he has a sinus infection.  Fever, congestion,coughing, coughing phlem which is yellow with brown.  [facility]  Dr gave him Maxiflu last week and he had an adverse reaction to it.  Drove to the drug store and forgot he drove there.  Walked home, called the police to report his car stolen when he had actually forgotten it.   Initial call taken by: Roselle Locus,  April 06, 2007 9:59 AM  Follow-up for Phone Call        mucinex otc two times a day and aoxil  500 tid for 10 day Follow-up by: Stacie Glaze MD,  April 06, 2007 1:40 PM    New/Updated Medications: MUCINEX 600 MG  TB12 (GUAIFENESIN) as directed AMOXIL 500 MG  CAPS (AMOXICILLIN) one by mouth three times a day x 10 days   Prescriptions: AMOXIL 500 MG  CAPS (AMOXICILLIN) one by mouth three times a day x 10 days  #30 x 0   Entered by:   Lynann Beaver CMA   Authorized by:   Stacie Glaze MD   Signed by:   Lynann Beaver CMA on 04/06/2007   Method used:   [facility] to ...       [facility] [address]  [id number]       [address]       [place], [place]  [address]       Ph: [phone] or [phone]       Fax: [phone]   RxID:   [id number] MUCINEX 600 MG  TB12 (GUAIFENESIN) as directed  #20 x 1   Entered by:   Lynann Beaver CMA   Authorized by:   Stacie Glaze MD   Signed by:   Lynann Beaver CMA on 04/06/2007   Method used:   Electronically [facility] to ...       [facility] [address]  [id number]       [address]       [place], [place]  [address]       Ph: [phone] or [phone]       Fax: [phone]   RxID:   [id number]     Appended Document: sinus infection Pt. notified.

## 2010-03-29 NOTE — Assessment & Plan Note (Signed)
Summary: loss of attention span/dm   Vital Signs:  Patient Profile:   45 Years Old Male Height:     73 inches Weight:      244 pounds Temp:     98.6 degrees F oral Pulse rate:   80 / minute Resp:     14 per minute BP sitting:   130 / 82  (left arm)  Vitals Entered By: Willy Eddy, LPN (September 25, 2007 3:53 PM)                 Chief Complaint:  c/o lack of concentration.  History of Present Illness: Pt is very concerned that his sx medication is making him less effective a work  Follow-Up Visit      This is a 45 year old man who presents for Follow-up visit.  The patient complains of dizziness, but denies chest pain, palpitations, syncope, low blood sugar symptoms, high blood sugar symptoms, edema, SOB, DOE, PND, and orthopnea.  Since the last visit the patient notes problems with medications.  When questioned about possible medication side effects, the patient notes fatigue.      Prior Medication List:  HALOPERIDOL 1 MG TABS (HALOPERIDOL) three times a day TOPAMAX 100 MG  TABS (TOPIRAMATE) at bedtime TREXIMET 85-500 MG  TABS (SUMATRIPTAN-NAPROXEN SODIUM) one by mouth as needed migraine [facility] MAG[facility]ESIUM 250 MG  TABS (MAGNESIUM) 2 once daily   Current Allergies (reviewed today): ! ASA  Past Medical History:    Reviewed history from 11/06/2006 and no changes required:       Personality D/O       Knee Pain       Headache       Seizure disorder       Migraines       Low back pain  Past Surgical History:    Reviewed history from 11/06/2006 and no changes required:       Denies surgical history        Impression & Recommendations:  Problem # 1:  SEIZURE DISORDER (ICD-780.39)  His updated medication list for this problem includes:    Topamax 100 Mg Tabs (Topiramate) .Marland Kitchen... At bedtime  Labs Reviewed: Hgb: 14.2 (11/27/2006)   Hct: 39.7 (11/27/2006)    SGOT: 30 (11/27/2006)   SGPT: 25 (11/27/2006)      Problem # 2:  SCHIZOTYPAL PERSONALITY DISORDER  (ICD-301.22)  Problem # 3:  LOW BACK PAIN (ICD-724.2)  Complete Medication List: 1)  Haloperidol 1 Mg Tabs (Haloperidol) .... Three times a day 2)  Topamax 100 Mg Tabs (Topiramate) .... At bedtime 3)  Treximet 85-500 Mg Tabs (Sumatriptan-naproxen sodium) .... One by mouth as needed migraine 4)  [facility] 250 Mg Tabs (Magnesium) .... 2 once daily    ]

## 2010-03-29 NOTE — Progress Notes (Signed)
Summary: WRITE LETTER  Phone Note Call from Patient Call back at [phone]   Caller: PT LIVE Call For: Stacie Glaze MD Summary of Call: PATIENT  CANNOT GET DISABILTY WITH SOCIAL SECURITY, THE ONLY WAY IS IF YOU CAN WRITE A LETTER STATING HE CANNOT WORK AT ALL.  PATIENT WOULD LIKE YOU TO FAX IT TO [facility]  FAX [phone]. Initial call taken by: Celine Ahr,  May 19, 2008 3:53 PM  Follow-up for Phone Call        that not true.... he can work,  just limitied in hours and job stress,  I cannot misrepresent this Follow-up by: Stacie Glaze MD,  May 20, 2008 9:00 AM  Additional Follow-up for Phone Call Additional follow up Details #1::        Notified pt and wife of Dr. Lovell Sheehan" answer. Additional Follow-up by: Lynann Beaver CMA,  May 20, 2008 9:58 AM

## 2010-03-29 NOTE — Letter (Signed)
Summary: H/P report  H/P report   Imported By: Kassie Mends 10/27/2007 12:34:56  _____________________________________________________________________  External Attachment:    Type:   Image     Comment:   H/P report

## 2010-03-29 NOTE — Progress Notes (Signed)
Summary: migraine  Phone Note Call from Patient   Caller: Spouse Summary of Call: Pt had a migraine last night, and had 3 episodes of vomiting.  Wife is concerned about this, and wondering if it is the Depakote? Initial call taken by: Lynann Beaver CMA,  February 02, 2008 9:00 AM  Follow-up for Phone Call        dr Lovell Sheehan is aware- Follow-up by: Willy Eddy, LPN,  February 02, 2008 2:56 PM

## 2010-03-29 NOTE — Progress Notes (Signed)
Summary: samples of migraine meds.  Phone Note Call from Patient Call back at [phone]   Caller: Patient Call For: Dr. Lovell Sheehan Reason for Call: Lab or Test Results Summary of Call: Pt. calls asking for samples of "migraine meds". Call him at [phone] Initial call taken by: [facility] CMA,  October 26, 2007 10:19 AM  Follow-up for Phone Call        none available left message on machine for pt  Follow-up by: Willy Eddy, LPN,  October 26, 2007 11:17 AM

## 2010-03-29 NOTE — Progress Notes (Signed)
Summary: new alternate rx needed  Phone Note Call from Patient   Caller: Patient----walk in Summary of Call: Just saw Dr Rodena Medin and Celexa 20mg  was sent to [facility]. Patient had me to call to see if Celexa is covered. I spoke with [facility] and they told me that it is not a covered medication with them. he must use Prozac or Cymbalta. please send a new rx to [facility]. [phone]. pt has no phone. Initial call taken by: Warnell Forester,  February 09, 2010 9:36 AM  Follow-up for Phone Call        pls clarify. pt stated needed more affordable medication than cymbalta. if cymbalta is covered then would continue cymbalta but increase dose to 60mg  once daily (was 30mg ) Follow-up by: Edwyna Perfect MD,  February 09, 2010 9:47 AM  Additional Follow-up for Phone Call Additional follow up Details #1::        [facility] company said that Cymbalta is fine. I think pt is confused. Since he has no phone to reach him, he was just checking before he goes to pick up rx, if his new rx will be there. Additional Follow-up by: Warnell Forester,  February 09, 2010 10:37 AM    Additional Follow-up for Phone Call Additional follow up Details #2::    if they won't cover celexa then  increase cymbalta 60mg  once daily. (has 30mg  capsules and can take two once daily until gets prescription) Follow-up by: Edwyna Perfect MD,  February 09, 2010 10:44 AM  Additional Follow-up for Phone Call Additional follow up Details #3:: Details for Additional Follow-up Action Taken: Pt aware of this. Romualdo Bolk, CMA (AAMA)  February 09, 2010 11:21 AM

## 2010-03-29 NOTE — Assessment & Plan Note (Signed)
Summary: BLOOD SUGAR DROPS/OV PER BONNYE/PS   Vital Signs:  Patient Profile:   45 Years Old Male Height:     73 inches Weight:      278 pounds Temp:     98.4 degrees F oral Pulse rate:   80 / minute Resp:     14 per minute BP sitting:   140 / 90  (left arm)  Vitals Entered By: Willy Eddy, LPN (April 13, 2008 4:04 PM)                 Chief Complaint:  pt states his sugar is dropping, but doesnt have a glucometer to check b lood sugar- states he gets shakey , and then eats and he feels fine- also has questions about working and disability.  History of Present Illness: Has noted episoded of low glucose with shack and sweats and after he eats the problem goes away. He continues to have migraine head aches.  He has difficulty keeping a job due to his personality disorder. he has gained a lot of weight due to the depakote but the head aches and the mood disorder has improved  The root of the behavoir issues lies in the personality disorder and his moderate mental limitations    Current Allergies: ! ASA  Past Medical History:    Reviewed history from 11/17/2007 and no changes required:       Personality D/O       Knee Pain       Headache       Seizure disorder( myoclonus)       Migraines       Low back pain       myoclonus  Past Surgical History:    Reviewed history from 11/06/2006 and no changes required:       Denies surgical history   Family History:    Reviewed history from 07/07/2007 and no changes required:       unknown  ( astranged)       Family History of CAD Male 1st degree relative <50  Social History:    Reviewed history from 12/04/2006 and no changes required:       Occupation: worker       Single       Former Smoker    Review of Systems       The patient complains of weight gain.  The patient denies anorexia, fever, weight loss, vision loss, decreased hearing, hoarseness, chest pain, syncope, dyspnea on exertion, peripheral edema,  prolonged cough, headaches, hemoptysis, abdominal pain, melena, hematochezia, severe indigestion/heartburn, hematuria, incontinence, genital sores, muscle weakness, suspicious skin lesions, transient blindness, difficulty walking, depression, unusual weight change, abnormal bleeding, enlarged lymph nodes, angioedema, breast masses, and testicular masses.     Physical Exam  General:     alert and overweight-appearing.   Head:     normocephalic and no abnormalities palpated.   Eyes:     vision grossly intact and pupils reactive to light.   Ears:     R ear normal and L ear normal.   Neck:     No deformities, masses, or tenderness noted. Lungs:     Normal respiratory effort, chest expands symmetrically. Lungs are clear to auscultation, no crackles or wheezes. Heart:     Normal rate and regular rhythm. S1 and S2 normal without gallop, murmur, click, rub or other extra sounds. Abdomen:     Bowel sounds positive,abdomen soft and non-tender without masses, organomegaly or hernias noted.  Psych:     Oriented X3, flat affect, easily distracted, poor concentration, and judgment poor.      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 1 three times a day    Depakote 500 Mg Tbec (Divalproex sodium) .Marland Kitchen... 1 once daily   Problem # 3:  REACTIVE HYPOGLYCEMIA (ICD-251.2) de to the weigt gain he need to eat 5 small meals a day to control the hypoglycemia  Complete Medication List: 1)  Haloperidol 1 Mg Tabs (Haloperidol) .Marland Kitchen.. 1 in am and 2  at bedtime 2)  Treximet 85-500 Mg Tabs  (Sumatriptan-naproxen sodium) .... One by mouth as needed migraine 3)  Cvs Magnesium 250 Mg Tabs (Magnesium) .... 2 once daily 4)  Nortriptyline Hcl 50 Mg Caps (Nortriptyline hcl) .Marland Kitchen.. 1 at bedtime 5)  Clonazepam Odt 0.5 Mg Tbdp (Clonazepam) .Marland Kitchen.. 1 three times a day 6)  Vicoprofen 7.5-200 Mg Tabs (Hydrocodone-ibuprofen) .Marland Kitchen.. 1 as needed sever headache 7)  Depakote 500 Mg Tbec (Divalproex sodium) .Marland Kitchen.. 1 once daily   Patient Instructions: 1)  you have reactive low blood sugars and you need to eat snaks ( high protein snaks) such as peanut butter on crackers or cheee on crackers) if you feal a low sugar then the best thing to do is dring juice of some kind to raise the sugar rigth away

## 2010-03-29 NOTE — Assessment & Plan Note (Signed)
Summary: jerking and twitching can't see Neurologist/nta   Vital Signs:  Patient Profile:   45 Years Old Male Height:     73 inches Weight:      240 pounds Temp:     98.5 degrees F oral BP sitting:   122 / 86  (left arm) Cuff size:   regular  Vitals Entered By: Raechel Ache, RN (November 04, 2007 8:37 AM)                 Chief Complaint:  Stopped seizure med 2 weeks ago per Dr Anne Hahn and now having twitching and tics and blurred vision. Went to [employer] Sunday.Marland Kitchen  History of Present Illness: 45 year old woman with a long history of seizure disorder.  He is tapered and discontinued Topamax and has had recurrence of his seizure activity.  This sounds like severe myoclonic jerks without loss of consciousness, but occurs several times daily and does interfere with activities, such as driving.  He talked with his neurologist earlier this morning and has been placed on clonazepam, which he is not yet filled.  He has also been placed on nortriptyline for migraine prophylaxis.  The seizures are apparently, the same as they have been in the past, which have been controlled for years on anticonvulsant therapy    Current Allergies: ! ASA  Past Medical History:    Reviewed history from 11/06/2006 and no changes required:       Personality D/O       Knee Pain       Headache       Seizure disorder       Migraines       Low back pain     Review of Systems       The patient complains of transient blindness.  The patient denies anorexia, fever, weight loss, weight gain, vision loss, decreased hearing, hoarseness, chest pain, syncope, dyspnea on exertion, peripheral edema, prolonged cough, headaches, hemoptysis, abdominal pain, melena, hematochezia, severe indigestion/heartburn, hematuria, incontinence, genital sores, muscle weakness, suspicious skin lesions, difficulty walking, depression, unusual weight change, abnormal bleeding, enlarged lymph nodes, angioedema, breast masses, and  testicular masses.         no further episodes of blurred vision; two recent ED evaluations, which were reviewed   Physical Exam  General:     Well-developed,well-nourished,in no acute distress; alert,appropriate and cooperative throughout examinationalert.   Head:     Normocephalic and atraumatic without obvious abnormalities. No apparent alopecia or balding. Eyes:     No corneal or conjunctival inflammation noted. EOMI. Perrla. Funduscopic exam benign, without hemorrhages, exudates or papilledema. Vision grossly normal. Neck:     No deformities, masses, or tenderness noted. Lungs:     Normal respiratory effort, chest expands symmetrically. Lungs are clear to auscultation, no crackles or wheezes. Heart:     Normal rate and regular rhythm. S1 and S2 normal without gallop, murmur, click, rub or other extra sounds. Neurologic:     alert & oriented X3, cranial nerves II-XII intact, strength normal in all extremities, finger-to-nose normal, heel-to-shin normal, and Romberg negative.      Impression & Recommendations:  Problem # 1:  SEIZURE DISORDER (ICD-780.39)  His updated medication list for this problem includes:    Topamax 100 Mg Tabs (Topiramate) .Marland Kitchen... At bedtime patient will follow up with Dr. Anne Hahn.  This morning and began Klonopin; driving status will be discussed with his neurologist  Problem # 2:  HEADACHE (ICD-784.0)  His updated medication  list for this problem includes:    Treximet 85-500 Mg Tabs (Sumatriptan-naproxen sodium) ..... 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 Three times a day 2)  Topamax 100 Mg Tabs (Topiramate) .... At bedtime 3)  Treximet 85-500 Mg Tabs (Sumatriptan-naproxen sodium) .... One by mouth as needed migraine 4)  Cvs Magnesium 250 Mg Tabs (Magnesium) .... 2 once daily 5)  Nortriptyline Hcl 10 Mg Caps  (Nortriptyline hcl) .... 3 at bedtime   Patient Instructions: 1)  follow-up with Dr. Anne Hahn as scheduled 2)  Please schedule a follow-up appointment as needed.   Prescriptions: HALOPERIDOL 1 MG TABS (HALOPERIDOL) three times a day  #90 x 4   Entered and Authorized by:   Gordy Savers  MD   Signed by:   Gordy Savers  MD on 11/04/2007   Method used:   Print then Give to Patient   RxID:   [id number]  ]

## 2010-03-29 NOTE — Progress Notes (Signed)
Summary: ?side effect of med  Phone Note Call from Patient Call back at Home Phone [phone]   Caller: Patient Call For: Stacie Glaze MD Summary of Call: pt stated cymbalta 30mg  is making him sleep and tired please advise. Initial call taken by: Heron Sabins,  November 14, 2009 3:46 PM  New Problems: SOMNOLENCE (ICD-780.09)   New Problems: SOMNOLENCE (ICD-780.09)  Appended Document: ?side effect of med per dr Lovell Sheehan- schedule for sleep studty

## 2010-03-30 NOTE — Letter (Signed)
Summary: [facility]  [facility]   Imported By: Maryln Gottron 04/15/2008 13:36:05  _____________________________________________________________________  External Attachment:    Type:   Image     Comment:   External Document

## 2010-04-09 ENCOUNTER — Telehealth: Payer: Self-pay | Admitting: *Deleted

## 2010-04-09 NOTE — Telephone Encounter (Signed)
Pt is asking what to take for laryngitis.  Is looking at OTC meds, but I could not understand what he is saying.  What can he take?

## 2010-04-09 NOTE — Telephone Encounter (Signed)
Notified wife he can Zyrtec.

## 2010-04-09 NOTE — Telephone Encounter (Signed)
Talked with wife and it is generic zyrtec ,can he take ? Yes per dr Hervey Ard.but if not better he needs to be seen

## 2010-04-25 ENCOUNTER — Encounter: Payer: Self-pay | Admitting: Internal Medicine

## 2010-04-26 ENCOUNTER — Encounter: Payer: Self-pay | Admitting: Internal Medicine

## 2010-04-26 ENCOUNTER — Ambulatory Visit (INDEPENDENT_AMBULATORY_CARE_PROVIDER_SITE_OTHER): Payer: PRIVATE HEALTH INSURANCE | Admitting: Internal Medicine

## 2010-04-26 DIAGNOSIS — K648 Other hemorrhoids: Secondary | ICD-10-CM | POA: Insufficient documentation

## 2010-04-26 DIAGNOSIS — M25552 Pain in left hip: Secondary | ICD-10-CM | POA: Insufficient documentation

## 2010-04-26 DIAGNOSIS — M545 Low back pain, unspecified: Secondary | ICD-10-CM

## 2010-04-26 DIAGNOSIS — F21 Schizotypal disorder: Secondary | ICD-10-CM

## 2010-04-26 DIAGNOSIS — R51 Headache: Secondary | ICD-10-CM

## 2010-04-26 DIAGNOSIS — R404 Transient alteration of awareness: Secondary | ICD-10-CM

## 2010-04-26 DIAGNOSIS — M25559 Pain in unspecified hip: Secondary | ICD-10-CM

## 2010-04-26 DIAGNOSIS — R569 Unspecified convulsions: Secondary | ICD-10-CM

## 2010-04-26 DIAGNOSIS — R635 Abnormal weight gain: Secondary | ICD-10-CM

## 2010-04-26 MED ORDER — HALOPERIDOL 1 MG PO TABS: 1.0000 mg | ORAL_TABLET | ORAL | Status: DC

## 2010-04-26 MED ORDER — DULOXETINE HCL 60 MG PO CPEP: 60.0000 mg | ORAL_CAPSULE | Freq: Every day | ORAL | Status: DC

## 2010-04-26 NOTE — Assessment & Plan Note (Signed)
He has been seizure-free

## 2010-04-26 NOTE — Progress Notes (Signed)
  Subjective:    Patient ID: Steven Vincent, male    DOB: August 09, 1965, 45 y.o.   MRN: [id number]  HPI  the patient presents for followup of his history of headaches and a seizure disorder with 2 complaints #1 he has been experiencing difficulty with sleep he has been working second shift in a new job and he has trouble falling asleep after work and waking up in the morning. He also has noted some bright red blood per rectum on his stool he has no history of hemorrhoids he is not lightheaded nor pale nor any other symptoms of acute lower GI bleed. The blood is not present every time he wipes it is intermittent suggesting that it is probably an internal hemorrhoid that he has not detected.     Review of Systems  Constitutional: Negative for fever and fatigue.  HENT: Negative for hearing loss, congestion, neck pain and postnasal drip.   Eyes: Negative for discharge, redness and visual disturbance.  Respiratory: Negative for cough, shortness of breath and wheezing.   Cardiovascular: Negative for leg swelling.  Gastrointestinal: Negative for abdominal pain, constipation and abdominal distention.  Genitourinary: Negative for urgency and frequency.  Musculoskeletal: Negative for joint swelling and arthralgias.  Skin: Negative for color change and rash.  Neurological: Negative for weakness and light-headedness.  Hematological: Negative for adenopathy.  Psychiatric/Behavioral: Negative for behavioral problems.   Past Medical History  Diagnosis Date  . Personality disorder   . Knee pain   . Headache   . Seizure disorder   . Migraines   . Low back pain    History reviewed. No pertinent past surgical history.  reports that he quit smoking about 20 years ago. He does not have any smokeless tobacco history on file. He reports that he does not drink alcohol or use illicit drugs. family history includes Heart disease in his father.         Objective:   Physical Exam  Constitutional: He  appears well-developed and well-nourished.       overweight  HENT:  Head: Normocephalic and atraumatic.  Eyes: Conjunctivae are normal. Pupils are equal, round, and reactive to light.  Neck: Normal range of motion. Neck supple.  Cardiovascular: Normal rate and regular rhythm.   Pulmonary/Chest: Effort normal and breath sounds normal.  Abdominal: Soft. Bowel sounds are normal.  Musculoskeletal: He exhibits tenderness.  Psychiatric: His mood appears anxious. His affect is angry. He expresses impulsivity. He expresses no suicidal plans and no homicidal plans.          Assessment & Plan:

## 2010-04-26 NOTE — Assessment & Plan Note (Signed)
The use of over-the-counter preparation H. Was discussed should the problem worsen referral to gastroenterology as planned

## 2010-04-26 NOTE — Assessment & Plan Note (Signed)
He is stable on his Haldol and a refill was sent to his pharmacy

## 2010-04-26 NOTE — Assessment & Plan Note (Signed)
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

## 2010-04-26 NOTE — Assessment & Plan Note (Signed)
He has bursitis in his left hip there is no interference with gait but he has some pain when he lies on the hip we discussed stretching especially of the iliotibial ligament and exercise

## 2010-04-26 NOTE — Assessment & Plan Note (Signed)
He has been able to lose weight we recommended that he concentrate on his diet and exercise

## 2010-04-26 NOTE — Assessment & Plan Note (Signed)
He has poor sleep hygiene and a habit of staying up late with his wife watching television so we discussed setting midnight as an absolute bed line to going to bed with no TV after midline night no eating late to interfere with his sleeping patterns and reestablish a sleep hygiene pattern

## 2010-07-10 NOTE — Assessment & Plan Note (Signed)
[facility]                                 ON-CALL NOTE   DRAEDEN, KELLMAN                      MRN:          [id number]  DATE:10/31/2007                            DOB:          Dec 11, 1965    DATE OF INTERACTION:  October 31, 2003 at 4:27 p.m.   PHONE NUMBER:  [phone].   CALLER:  Lavert Matousek, the wife.   OBJECTIVE:  The patient works at [employer] as a Public affairs consultant, which is  where he is now.  The wife is in [place].  She does not drive.  He  has had history of seizure disorder that Dr. Anne Hahn felt he could wean  off from his medications recently and now has been totally off his  medicines for approximately 1 week.  Since that time, he has had 3  spells that he called twitching, that last 3-5 minutes at a time and is  currently starting to have the same thing where he feels he is having  another twitching spell and is worried that he might develop into  a  seizure.  I know nothing more about this.  I told the wife to have  someone drive him to the emergency room as he is not able to drive,  where he can be evaluated possibly loaded with medication if he needs to  be, I tried to stress that he should not drive and if his spells only  lasts 3-5 minutes, presumably, she could probably organize getting  someone to take him to the emergency room.  Unknown whether these  twitching spells are indeed seizures or not, but with him recently being  off medication, I think one has to assume that it might be.  Primary  care Adren Dollins is Dr. Lovell Sheehan, home office is [place].     Arta Silence, MD  Electronically Signed    RNS/MedQ  DD: 10/31/2007  DT: 11/01/2007  Job #: [id number]

## 2010-08-03 ENCOUNTER — Ambulatory Visit (INDEPENDENT_AMBULATORY_CARE_PROVIDER_SITE_OTHER): Payer: PRIVATE HEALTH INSURANCE | Admitting: Internal Medicine

## 2010-08-03 ENCOUNTER — Encounter: Payer: Self-pay | Admitting: Internal Medicine

## 2010-08-03 VITALS — BP 136/80 | HR 60 | Temp 98.2°F | Resp 16 | Ht 74.0 in | Wt 266.0 lb

## 2010-08-03 DIAGNOSIS — M545 Low back pain, unspecified: Secondary | ICD-10-CM

## 2010-08-03 DIAGNOSIS — R635 Abnormal weight gain: Secondary | ICD-10-CM

## 2010-08-03 DIAGNOSIS — G43909 Migraine, unspecified, not intractable, without status migrainosus: Secondary | ICD-10-CM

## 2010-08-03 DIAGNOSIS — R569 Unspecified convulsions: Secondary | ICD-10-CM

## 2010-08-03 DIAGNOSIS — F21 Schizotypal disorder: Secondary | ICD-10-CM

## 2010-08-03 MED ORDER — HALOPERIDOL 1 MG PO TABS: 1.0000 mg | ORAL_TABLET | ORAL | Status: DC

## 2010-08-03 MED ORDER — SUMATRIPTAN-NAPROXEN SODIUM 85-500 MG PO TABS: 1.0000 | ORAL_TABLET | ORAL | Status: DC | PRN

## 2010-08-03 MED ORDER — TOPIRAMATE 50 MG PO TABS: 50.0000 mg | ORAL_TABLET | Freq: Every day | ORAL | Status: DC

## 2010-09-21 ENCOUNTER — Telehealth: Payer: Self-pay | Admitting: *Deleted

## 2010-09-21 NOTE — Telephone Encounter (Signed)
Left message to make office visit appt.

## 2010-09-21 NOTE — Telephone Encounter (Signed)
Pt is snapping at his wife and thinks his Haldol should be changed.

## 2010-09-21 NOTE — Telephone Encounter (Signed)
If he feels like he needs an ov please make

## 2010-09-24 ENCOUNTER — Ambulatory Visit (INDEPENDENT_AMBULATORY_CARE_PROVIDER_SITE_OTHER): Payer: PRIVATE HEALTH INSURANCE | Admitting: Family Medicine

## 2010-09-24 ENCOUNTER — Encounter: Payer: Self-pay | Admitting: Family Medicine

## 2010-09-24 ENCOUNTER — Telehealth: Payer: Self-pay | Admitting: *Deleted

## 2010-09-24 DIAGNOSIS — G43909 Migraine, unspecified, not intractable, without status migrainosus: Secondary | ICD-10-CM

## 2010-09-24 DIAGNOSIS — F21 Schizotypal disorder: Secondary | ICD-10-CM

## 2010-09-24 DIAGNOSIS — R569 Unspecified convulsions: Secondary | ICD-10-CM

## 2010-09-24 NOTE — Progress Notes (Signed)
  Subjective:    Patient ID: Steven Vincent, male    DOB: 1965/11/17, 45 y.o.   MRN: [id number]  HPI Patient presents today with concerns about psychiatric medications. Reported schizotypal personality disorder. He has been on Haldol when necessary for several years but has not like a flat affect and also states he's had some anger issues recently. No clear delusions or hallucinations. He does not report any paranoia. He has appointment to see psychiatrist next week. He also takes Cymbalta 60 mg daily. Denies any clear extrapyramidal side effects  History migraine headaches and takes Topamax prophylactically. Still has occasional breakthrough symptoms. Using neurologist for this and also has reported seizure disorder with none since 2009.   Review of Systems  Constitutional: Negative for activity change, appetite change and unexpected weight change.  Respiratory: Negative for shortness of breath.   Cardiovascular: Negative for chest pain.  Neurological: Negative for tremors, syncope and headaches.  Psychiatric/Behavioral: Negative for suicidal ideas, hallucinations, confusion, dysphoric mood and agitation.       Objective:   Physical Exam  Constitutional: He is oriented to person, place, and time.       Patient is alert and cooperative. Somewhat flat affect  HENT:  Head: Normocephalic and atraumatic.  Neck: Neck supple. No thyromegaly present.  Cardiovascular: Normal rate, regular rhythm and normal heart sounds.   No murmur heard. Pulmonary/Chest: Effort normal and breath sounds normal. No respiratory distress. He has no wheezes. He has no rales.  Musculoskeletal: He exhibits no edema.  Neurological: He is alert and oriented to person, place, and time.  Psychiatric:       Flat affect but appropriate responses.          Assessment & Plan:  #1 schizotypal personality disorder. Patient has questions regarding Haldol. We recommended not making any changes since he sees  psychiatrist next week. #2 history migraine headaches relatively stable. Continue Topamax

## 2010-09-24 NOTE — Telephone Encounter (Signed)
Was seen by dr Caryl Never t his am

## 2010-09-24 NOTE — Telephone Encounter (Signed)
[facility] Triage Call Report Triage Record Num: [id number] Operator: Joneen Boers Patient Name: Steven Vincent Call Date & Time: 09/23/2010 10:20:07PM Patient Phone: [phone] PCP: Patient Gender: Male PCP Fax : Patient DOB: 1965-12-28 Practice Name: [facility] Reason for Call: Steven Vincent is calling b/c he suspects that his haldol may be losing effectiveness. Face has become 'masked, i.e. lacking expression/flat affect', therapist noticed he has been dazing out. Thought he heard voices screaming while he was driving, lasted only a minute, did not involve commands to him nor ws it directed at him. He denies any agression, impulsivity or thoughts of harming himself or others. Wife says that he has had a few <30 second dazing out moments tonight but he is not aware of this. Haldol 1 mg in a.m., 2 mg in pm. Appt w/Dr. Lovell Sheehan on 09/28/10, and psychiatrist w/[facility] on 10/02/10. People say he looks mean, like he could beat them up, but he does not feel that way- has concerns re: medicine's effects on his features. 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 RN advised discussing w/therapist how to best talk w/work colleagues about his medical disability so that he is not scaring anyone and others perceive him as approachable. Schizophrenia, diagnosed protocol/new or increasing symptoms and taking meds/following therapy as rxed/call provider in 24h. Pt will call in a.m., agreed to call back for any worsening s/s tonight. Rn advised low stress evening, good nutrition, sleep. Pt agreed that he can wait till the a.m. and feels comfortable with this plan. Protocol(s) Used: Schizophrenia, Diagnosed Recommended Outcome per Protocol: Call Provider within 24 Hours Reason for Outcome: New or increasing symptoms AND taking medications/following therapy as prescribed Care Advice: ~ Should not be alone, arrange for support (family member, friend, etc.). ~ Continue all  prescription medication as recommended until evaluated by provider. ~ Follow current treatment plan until evaluated by provider. ~ Keep a diary of medication dosage along with the severity and degree of distress associated with adverse reaction. Keep stimulation to a minimum and limit exposure to stressful situations. Communications should be simple, clear, and non-demanding. ~ ~ SYMPTOM / CONDITION MANAGEMENT ~ Call provider or mental healthcare provider if symptoms worsen or new symptoms develop. 09/23/2010 10:40:17PM Page 1 of 1 [id number]

## 2010-09-24 NOTE — Patient Instructions (Signed)
Continue with same medications and follow up with psychiatrist as scheduled.

## 2010-09-26 ENCOUNTER — Telehealth: Payer: Self-pay | Admitting: *Deleted

## 2010-09-26 NOTE — Telephone Encounter (Signed)
Pt is having nightmares, and still has anger issues.  Dizzy in the am.

## 2010-09-27 NOTE — Telephone Encounter (Signed)
Left message to keep appt with pyschologist.

## 2010-09-27 NOTE — Telephone Encounter (Signed)
Would recommend that he see a psychologist he is being increased anger issues he has lost jobs in the past due to his anger issues

## 2010-09-28 ENCOUNTER — Ambulatory Visit: Payer: PRIVATE HEALTH INSURANCE | Admitting: Family Medicine

## 2010-09-28 ENCOUNTER — Ambulatory Visit: Payer: PRIVATE HEALTH INSURANCE | Admitting: Internal Medicine

## 2010-10-02 ENCOUNTER — Telehealth: Payer: Self-pay | Admitting: *Deleted

## 2010-10-02 NOTE — Telephone Encounter (Signed)
Wants Dr. Lovell Sheehan to know the what the pyschiatrist said.  He increased his Haldol to 4 mg. Daily and is weaning him off Cymbalta.

## 2010-10-03 NOTE — Telephone Encounter (Signed)
Dr jenkins is aware 

## 2010-11-21 ENCOUNTER — Telehealth: Payer: Self-pay | Admitting: *Deleted

## 2010-11-21 NOTE — Telephone Encounter (Signed)
Wife states that the pyschiatrist gave him Klonopin and she has read adverse reactions, and wants to know if Dr. Lovell Sheehan thinks he should take it/??

## 2010-11-21 NOTE — Telephone Encounter (Signed)
Klonopin would be good to coming down and control some of his anger issues he should try at it he has adverse side effects he can stop it without any long-term effects

## 2010-11-22 NOTE — Telephone Encounter (Signed)
Line busy x 2

## 2010-11-26 NOTE — Telephone Encounter (Signed)
Line continues to be busy. 

## 2010-11-28 LAB — BASIC METABOLIC PANEL
BUN: 17
CO2: 25
Calcium: 9.2
Chloride: 114 — ABNORMAL HIGH
Creatinine, Ser: 0.99
GFR calc Af Amer: >60
GFR calc non Af Amer: >60
Glucose, Bld: 88
Potassium: 3.8
Sodium: 142

## 2010-11-28 LAB — CBC
HCT: 43.4
Hemoglobin: 15.4
MCHC: 35.5
MCV: 91.8
Platelets: 153
RBC: 4.73
RDW: 12.6
WBC: 5.7

## 2010-11-28 LAB — DIFFERENTIAL
Basophils Absolute: 0.1
Basophils Relative: 1
Eosinophils Absolute: 0.1
Eosinophils Relative: 3
Lymphocytes Relative: 27
Lymphs Abs: 1.5
Monocytes Absolute: 0.5
Monocytes Relative: 9
Neutro Abs: 3.5
Neutrophils Relative %: 61

## 2010-11-28 LAB — GLUCOSE, CAPILLARY: Glucose-Capillary: 113 — ABNORMAL HIGH

## 2010-12-03 ENCOUNTER — Encounter: Payer: Self-pay | Admitting: Internal Medicine

## 2010-12-03 ENCOUNTER — Ambulatory Visit (INDEPENDENT_AMBULATORY_CARE_PROVIDER_SITE_OTHER): Payer: PRIVATE HEALTH INSURANCE | Admitting: Internal Medicine

## 2010-12-03 VITALS — BP 120/82 | HR 76 | Temp 98.2°F | Resp 16 | Ht 74.0 in | Wt 272.0 lb

## 2010-12-03 DIAGNOSIS — R569 Unspecified convulsions: Secondary | ICD-10-CM

## 2010-12-03 DIAGNOSIS — R635 Abnormal weight gain: Secondary | ICD-10-CM

## 2010-12-03 DIAGNOSIS — Z23 Encounter for immunization: Secondary | ICD-10-CM

## 2010-12-03 DIAGNOSIS — T887XXA Unspecified adverse effect of drug or medicament, initial encounter: Secondary | ICD-10-CM

## 2010-12-03 DIAGNOSIS — M545 Low back pain, unspecified: Secondary | ICD-10-CM

## 2010-12-03 DIAGNOSIS — F21 Schizotypal disorder: Secondary | ICD-10-CM

## 2010-12-03 LAB — HEPATIC FUNCTION PANEL
ALT: 34 U/L (ref 0–53)
AST: 31 U/L (ref 0–37)
Albumin: 4 g/dL (ref 3.5–5.2)
Alkaline Phosphatase: 74 U/L (ref 39–117)
Bilirubin, Direct: 0 mg/dL (ref 0.0–0.3)
Total Bilirubin: 0.7 mg/dL (ref 0.3–1.2)
Total Protein: 7 g/dL (ref 6.0–8.3)

## 2010-12-03 LAB — BASIC METABOLIC PANEL
BUN: 12 mg/dL (ref 6–23)
CO2: 25 mEq/L (ref 19–32)
Calcium: 8.8 mg/dL (ref 8.4–10.5)
Chloride: 107 mEq/L (ref 96–112)
Creatinine, Ser: 1 mg/dL (ref 0.4–1.5)
GFR: 87.78 mL/min (ref >60.00)
Glucose, Bld: 191 mg/dL — ABNORMAL HIGH (ref 70–99)
Potassium: 4 mEq/L (ref 3.5–5.1)
Sodium: 139 mEq/L (ref 135–145)

## 2010-12-03 LAB — TSH: TSH: 5.42 u[IU]/mL (ref 0.35–5.50)

## 2010-12-03 NOTE — Progress Notes (Signed)
  Subjective:    Patient ID: Steven Vincent, male    DOB: [birth year], 45 y.o.   MRN: [id number]  HPI  The patient's haldol was increased to 4 mg and he still feels that his brain works faster that his body. He states that he has been told that he may be bipolar . They have not fully addressed his personality disorder. The haldol was increased 1 month ago. The pt still has problems with anger...they added clonopin 1mg  BID This does makes him sleepy so he only takes at night   Review of Systems  Constitutional: Negative for fever and fatigue.  HENT: Negative for hearing loss, congestion, neck pain and postnasal drip.   Eyes: Negative for discharge, redness and visual disturbance.  Respiratory: Negative for cough, shortness of breath and wheezing.   Cardiovascular: Negative for leg swelling.  Gastrointestinal: Negative for abdominal pain, constipation and abdominal distention.  Genitourinary: Negative for urgency and frequency.  Musculoskeletal: Negative for joint swelling and arthralgias.  Skin: Negative for color change and rash.  Neurological: Negative for weakness and light-headedness.  Hematological: Negative for adenopathy.  Psychiatric/Behavioral: Negative for behavioral problems.   Past Medical History  Diagnosis Date  . Personality disorder   . Knee pain   . Headache   . Seizure disorder   . Migraines   . Low back pain    No past surgical history on file.  reports that he quit smoking about 20 years ago. He does not have any smokeless tobacco history on file. He reports that he does not drink alcohol or use illicit drugs. family history includes Heart disease in his father; Hyperlipidemia in his father; and Hypertension in his father. Allergies  Allergen Reactions  . Aspirin     GI upset       Objective:   Physical Exam  Nursing note and vitals reviewed. Constitutional: He appears well-developed and well-nourished.  HENT:  Head: Normocephalic and atraumatic.    Eyes: Conjunctivae are normal. Pupils are equal, round, and reactive to light.  Neck: Normal range of motion. Neck supple.  Cardiovascular: Normal rate and regular rhythm.   Pulmonary/Chest: Effort normal and breath sounds normal.  Abdominal: Soft. Bowel sounds are normal.          Assessment & Plan:  We has instructed him to try 1/2 of 1/4 of a table in the AM and keep with the one in the PM. We will communicate with the therapist about the personality disorder diagnosis in the past. Her blood work today to make sure that his lipids liver functions renal functions are all stable his current medications we'll check a TSH today because of fatigue.  I believe that he has and functioning at a relatively stable level on the current medications and is in a supportive environment at the school system.  He does not appear to have any angry outbursts and I believe that adjusting his medication too quickly may result in increased somnolence.

## 2010-12-03 NOTE — Patient Instructions (Addendum)
Pt needs clear directions for one task at a time... With clear instructions for how to complete the task. Written instructions are best. Distractability is part of his disorder.   Try a 1/2 clonopin in the morning

## 2010-12-04 ENCOUNTER — Telehealth: Payer: Self-pay | Admitting: Internal Medicine

## 2010-12-04 NOTE — Telephone Encounter (Signed)
His job is requesting a detailed letter of his problems which are causing him to miss work. He needs this letter, soon in order to keep his job. Please include a list of his medications. Please call patient when letter is ready.

## 2010-12-05 NOTE — Telephone Encounter (Signed)
Per dr Lovell Sheehan- pt needs to work AND NOT BE OUT- NO LETTER NOW

## 2011-02-15 ENCOUNTER — Telehealth: Payer: Self-pay | Admitting: Internal Medicine

## 2011-03-05 ENCOUNTER — Encounter: Payer: Self-pay | Admitting: Internal Medicine

## 2011-03-05 ENCOUNTER — Ambulatory Visit (INDEPENDENT_AMBULATORY_CARE_PROVIDER_SITE_OTHER): Payer: PRIVATE HEALTH INSURANCE | Admitting: Internal Medicine

## 2011-03-05 DIAGNOSIS — F909 Attention-deficit hyperactivity disorder, unspecified type: Secondary | ICD-10-CM

## 2011-03-05 DIAGNOSIS — M1990 Unspecified osteoarthritis, unspecified site: Secondary | ICD-10-CM

## 2011-03-05 DIAGNOSIS — G40909 Epilepsy, unspecified, not intractable, without status epilepticus: Secondary | ICD-10-CM

## 2011-03-05 MED ORDER — METHYLPHENIDATE HCL ER 20 MG PO TBCR: 20.0000 mg | EXTENDED_RELEASE_TABLET | ORAL | Status: DC

## 2011-03-05 NOTE — Progress Notes (Signed)
Subjective:    Patient ID: Steven Vincent, male    DOB: Apr 19, 1965, 46 y.o.   MRN: [id number]  HPI Was diagnosed with ADHD at vocational rehab. Having problems with work understanding his employers instructions. He has asked for written instructions. He cannot keep things in his head.  Had increased OA pain in left knee and thigh Blood pressure stable Has no rx for antiinflamatory    Review of Systems  Constitutional: Negative for fever and fatigue.  HENT: Negative for hearing loss, congestion, neck pain and postnasal drip.   Eyes: Negative for discharge, redness and visual disturbance.  Respiratory: Negative for cough, shortness of breath and wheezing.   Cardiovascular: Negative for leg swelling.  Gastrointestinal: Negative for abdominal pain, constipation and abdominal distention.  Genitourinary: Negative for urgency and frequency.  Musculoskeletal: Negative for joint swelling and arthralgias.  Skin: Negative for color change and rash.  Neurological: Negative for weakness and light-headedness.  Hematological: Negative for adenopathy.  Psychiatric/Behavioral: Negative for behavioral problems.   Past Medical History  Diagnosis Date  . Personality disorder   . Knee pain   . Headache   . Seizure disorder   . Migraines   . Low back pain     History   Social History  . Marital Status: Married    Spouse Name: N/A    Number of Children: N/A  . Years of Education: N/A   Occupational History  . disabled    Social History Main Topics  . Smoking status: Former Smoker    Quit date: 04/26/1990  . Smokeless tobacco: Not on file   Comment: stopped in 1992  . Alcohol Use: No  . Drug Use: No  . Sexually Active: Yes   Other Topics Concern  . Not on file   Social History Narrative  . No narrative on file    No past surgical history on file.  Family History  Problem Relation Age of Onset  . Heart disease Father   . Hyperlipidemia Father   . Hypertension Father      Allergies  Allergen Reactions  . Aspirin     GI upset    Current Outpatient Prescriptions on File Prior to Visit  Medication Sig Dispense Refill  . haloperidol (HALDOL) 1 MG tablet Take by mouth as directed. 2 in am and 2 at bedtime      . SUMAtriptan-naproxen (TREXIMET) 85-500 MG per tablet Take 1 tablet by mouth every 2 (two) hours as needed.  10 tablet  11  . topiramate (TOPAMAX) 50 MG tablet Take 1 tablet (50 mg total) by mouth daily.  30 tablet  11    BP 110/80  Pulse 68  Temp 98.2 F (36.8 C)  Resp 16  Ht 6' 2.5" (1.892 m)  Wt 274 lb (124.286 kg)  BMI 34.71 kg/m2       Objective:   Physical Exam  Constitutional: He appears well-developed and well-nourished.       Obese  HENT:  Head: Normocephalic and atraumatic.  Eyes: Conjunctivae are normal. Pupils are equal, round, and reactive to light.  Neck: Normal range of motion. Neck supple.  Cardiovascular: Normal rate and regular rhythm.   Pulmonary/Chest: Effort normal and breath sounds normal.  Abdominal: Soft. Bowel sounds are normal.          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 He does have high distractibility and difficulty following a series of commands at work.  I  believe that a trial of an ADHD medication might be beneficial for both his job security as well as his functioning levels and may help him with his progressive weight gain.  He reports no frequency of seizures. He reports no angry outbursts

## 2011-03-05 NOTE — Patient Instructions (Signed)
We will do a trial of medication a low dose... so we'll help you focus about daily activities The medication is generic The medication has some potential side effects including if it makes you angry or anxious you should stop the medication immediately and notify her office

## 2011-04-03 NOTE — Progress Notes (Signed)
  Subjective:    Patient ID: Steven Vincent, male    DOB: 02/25/1966, 46 y.o.   MRN: [id number]  HPI    Review of Systems     Objective:   Physical Exam        Assessment & Plan:

## 2011-04-05 ENCOUNTER — Ambulatory Visit (INDEPENDENT_AMBULATORY_CARE_PROVIDER_SITE_OTHER): Payer: PRIVATE HEALTH INSURANCE | Admitting: Internal Medicine

## 2011-04-05 ENCOUNTER — Encounter: Payer: Self-pay | Admitting: Internal Medicine

## 2011-04-05 VITALS — BP 140/90 | HR 76 | Temp 98.0°F | Resp 16 | Ht 74.5 in | Wt 270.0 lb

## 2011-04-05 DIAGNOSIS — F909 Attention-deficit hyperactivity disorder, unspecified type: Secondary | ICD-10-CM

## 2011-04-05 DIAGNOSIS — N419 Inflammatory disease of prostate, unspecified: Secondary | ICD-10-CM

## 2011-04-05 MED ORDER — METHYLPHENIDATE HCL ER 20 MG PO TBCR: 20.0000 mg | EXTENDED_RELEASE_TABLET | Freq: Every day | ORAL | Status: DC

## 2011-04-05 MED ORDER — CIPROFLOXACIN HCL 500 MG PO TABS: 500.0000 mg | ORAL_TABLET | Freq: Two times a day (BID) | ORAL | Status: AC

## 2011-04-05 NOTE — Progress Notes (Signed)
Subjective:    Patient ID: Steven Vincent, male    DOB: [birth year], 46 y.o.   MRN: [id number]  HPI  Patient presents for followup of ADD on top of depression and personality disorder.  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  He had difficulty concentrating on his work and we used an ADD drug to assist in his focus and he states that this has been successful in helping him.  He also some low back pain and some increased frequency and dysuria  Review of Systems  Constitutional: Negative for fever and fatigue.  HENT: Negative for hearing loss, congestion, neck pain and postnasal drip.   Eyes: Negative for discharge, redness and visual disturbance.  Respiratory: Negative for cough, shortness of breath and wheezing.   Cardiovascular: Negative for leg swelling.  Gastrointestinal: Negative for abdominal pain, constipation and abdominal distention.  Genitourinary: Negative for urgency and frequency.  Musculoskeletal: Negative for joint swelling and arthralgias.  Skin: Negative for color change and rash.  Neurological: Negative for weakness and light-headedness.  Hematological: Negative for adenopathy.  Psychiatric/Behavioral: Negative for behavioral problems.   Past Medical History  Diagnosis Date  . Personality disorder   . Knee pain   . Headache   . Seizure disorder   . Migraines   . Low back pain     History   Social History  . Marital Status: Married    Spouse Name: N/A    Number of Children: N/A  . Years of Education: N/A   Occupational History  . disabled    Social History Main Topics  . Smoking status: Former Smoker    Quit date: 04/26/1990  . Smokeless tobacco: Not on file   Comment: stopped in 1992  . Alcohol Use: No  . Drug Use: No  . Sexually Active: Yes   Other Topics Concern  . Not on file   Social History Narrative  . No narrative on file     No past surgical history on file.  Family History  Problem Relation Age of Onset  . Heart disease Father   . Hyperlipidemia Father   . Hypertension Father     Allergies  Allergen Reactions  . Aspirin     GI upset    Current Outpatient Prescriptions on File Prior to Visit  Medication Sig Dispense Refill  . haloperidol (HALDOL) 1 MG tablet Take by mouth as directed. 2 in am and 2 at bedtime      . methylphenidate (METADATE ER) 20 MG ER tablet Take 1 tablet (20 mg total) by mouth every morning.  30 tablet  0  . QUEtiapine (SEROQUEL) 50 MG tablet Take 50 mg by mouth. 1 in am and 2 at bedtime       . SUMAtriptan-naproxen (TREXIMET) 85-500 MG per tablet Take 1 tablet by mouth every 2 (two) hours as needed.  10 tablet  11  . topiramate (TOPAMAX) 50 MG tablet Take 1 tablet (50 mg total) by mouth daily.  30 tablet  11    BP 140/90  Pulse 76  Temp 98 F (36.7 C)  Resp 16  Ht 6' 2.5" (1.892 m)  Wt 270 lb (122.471 kg)  BMI 34.20 kg/m2       Objective:   Physical Exam  Nursing note and vitals reviewed. Constitutional: He appears well-developed and well-nourished.  HENT:  Head: Normocephalic and atraumatic.  Eyes: Conjunctivae are normal.  Pupils are equal, round, and reactive to light.  Neck: Normal range of motion. Neck supple.  Cardiovascular: Normal rate and regular rhythm.   Pulmonary/Chest: Effort normal and breath sounds normal.  Abdominal: Soft. Bowel sounds are normal.  Genitourinary:       Prostate is normal in size but markedly boggy and tender to touch especially in the left lobe          Assessment & Plan:  Acute prostatitis.  The patient will be treated with ciprofloxacin 500 mg by mouth twice a day for 2 weeks. Back pain I believe that his back pain is more likely due to the prostate inflammation at this time  ADD the patient is responding to the methylphenidate has worked well his constipation is improved and he is able to accomplish work related  tasks in a more timely way.  He is concerned about the stress of work and we did to him about some stress coping skills

## 2011-04-05 NOTE — Patient Instructions (Signed)
The patient is instructed to continue all medications as prescribed. Schedule followup with check out clerk upon leaving the clinic  

## 2011-04-19 ENCOUNTER — Telehealth: Payer: Self-pay | Admitting: *Deleted

## 2011-04-19 NOTE — Telephone Encounter (Signed)
Patient is aware 

## 2011-04-19 NOTE — Telephone Encounter (Signed)
Per dr Lovell Sheehan- try robitussin fast max

## 2011-04-19 NOTE — Telephone Encounter (Signed)
Patient is calling because he has sinus congestion, drainage, and sore throat x 1 week.  He would like something called in if possible

## 2011-05-22 ENCOUNTER — Telehealth: Payer: Self-pay | Admitting: Internal Medicine

## 2011-05-22 NOTE — Telephone Encounter (Signed)
Patient called stating that Dr. Gypsy Lore has taken him off of Seroquel and stated that Dr. Gypsy Lore states that he no longer has schisotypical disorder. Patient states his wife has noticed that he is getting upset more often while taking methylphenidate 20mg . Please advise. Patient states he is not upset when he goes to work because he takes the methylphenidate before.

## 2011-05-22 NOTE — Telephone Encounter (Signed)
Please gently inform patient that he should discuss the cessation of that drug with his psychiatrist for discontinued it he should also discuss the symptomology he is experiencing with the psychiatrist.

## 2011-05-23 NOTE — Telephone Encounter (Signed)
Notified pt. 

## 2011-06-03 ENCOUNTER — Telehealth: Payer: Self-pay | Admitting: Internal Medicine

## 2011-06-03 NOTE — Telephone Encounter (Signed)
Pt encouraged to exercise but in moderastion-  Start slowly and progress to more and longer-

## 2011-06-03 NOTE — Telephone Encounter (Signed)
Pt called and said that he has the opportunity to be a part of an program through [employer], that is called [employer]. It is an exercise program, that is completely free to the patient. Pt wants to know, due to his conditions, what are his exercise limitations?

## 2011-06-12 ENCOUNTER — Telehealth: Payer: Self-pay | Admitting: Internal Medicine

## 2011-06-12 NOTE — Telephone Encounter (Signed)
Pt is know longer taking seroquel. Pt is having trouble sleeping. Pt has tried otc [facility]diphen[facility]ramine-[facility]/sleep aid non habit forming. [facility] [employer]

## 2011-06-12 NOTE — Telephone Encounter (Signed)
Pt informed per dr Lovell Sheehan to call mental md to give new sleeping meds-pt agrees

## 2011-06-12 NOTE — Telephone Encounter (Addendum)
Steven Vincent states that his mental doctor told him he no longer had the mental illness that he had as a young man, and did not have to take the Seroquel.  Now he only has sleep issues.  This is documented in a phone note from [place].

## 2011-06-12 NOTE — Telephone Encounter (Signed)
Why did he stop seroquel?

## 2011-07-05 ENCOUNTER — Ambulatory Visit (INDEPENDENT_AMBULATORY_CARE_PROVIDER_SITE_OTHER): Payer: PRIVATE HEALTH INSURANCE | Admitting: Internal Medicine

## 2011-07-05 ENCOUNTER — Encounter: Payer: Self-pay | Admitting: Internal Medicine

## 2011-07-05 VITALS — BP 136/86 | HR 72 | Temp 98.2°F | Resp 16 | Ht 74.5 in | Wt 258.0 lb

## 2011-07-05 DIAGNOSIS — Z9109 Other allergy status, other than to drugs and biological substances: Secondary | ICD-10-CM

## 2011-07-05 DIAGNOSIS — E669 Obesity, unspecified: Secondary | ICD-10-CM

## 2011-07-05 DIAGNOSIS — T887XXA Unspecified adverse effect of drug or medicament, initial encounter: Secondary | ICD-10-CM

## 2011-07-05 DIAGNOSIS — F909 Attention-deficit hyperactivity disorder, unspecified type: Secondary | ICD-10-CM

## 2011-07-05 DIAGNOSIS — J309 Allergic rhinitis, unspecified: Secondary | ICD-10-CM

## 2011-07-05 MED ORDER — METHYLPHENIDATE HCL ER 20 MG PO TBCR: 20.0000 mg | EXTENDED_RELEASE_TABLET | Freq: Every day | ORAL | Status: DC

## 2011-07-05 NOTE — Patient Instructions (Signed)
The patient is instructed to continue all medications as prescribed. Schedule followup with check out clerk upon leaving the clinic  

## 2011-07-05 NOTE — Progress Notes (Signed)
  Subjective:    Patient ID: Steven Vincent, male    DOB: [birth year], 46 y.o.   MRN: [id number]  HPI Takes the ADD medication M-Th This has helped him with school Has lost 12 pounds on pounds on diet    Review of Systems  Constitutional: Negative for fever and fatigue.  HENT: Negative for hearing loss, congestion, neck pain and postnasal drip.   Eyes: Negative for discharge, redness and visual disturbance.  Respiratory: Negative for cough, shortness of breath and wheezing.   Cardiovascular: Negative for leg swelling.  Gastrointestinal: Negative for abdominal pain, constipation and abdominal distention.  Genitourinary: Negative for urgency and frequency.  Musculoskeletal: Negative for joint swelling and arthralgias.  Skin: Negative for color change and rash.  Neurological: Negative for weakness and light-headedness.  Hematological: Negative for adenopathy.  Psychiatric/Behavioral: Negative for behavioral problems.   Past Medical History  Diagnosis Date  . Personality disorder   . Knee pain   . Headache   . Seizure disorder   . Migraines   . Low back pain     History   Social History  . Marital Status: Married    Spouse Name: N/A    Number of Children: N/A  . Years of Education: N/A   Occupational History  . disabled    Social History Main Topics  . Smoking status: Former Smoker    Quit date: 04/26/1990  . Smokeless tobacco: Not on file   Comment: stopped in 1992  . Alcohol Use: No  . Drug Use: No  . Sexually Active: Yes   Other Topics Concern  . Not on file   Social History Narrative  . No narrative on file    No past surgical history on file.  Family History  Problem Relation Age of Onset  . Heart disease Father   . Hyperlipidemia Father   . Hypertension Father     Allergies  Allergen Reactions  . Aspirin     GI upset    Current Outpatient Prescriptions on File Prior to Visit  Medication Sig Dispense Refill  . SUMAtriptan-naproxen  (TREXIMET) 85-500 MG per tablet Take 1 tablet by mouth every 2 (two) hours as needed.  10 tablet  11  . topiramate (TOPAMAX) 50 MG tablet Take 1 tablet (50 mg total) by mouth daily.  30 tablet  11  . methylphenidate (METADATE ER) 20 MG ER tablet Take 1 tablet (20 mg total) by mouth every morning.  30 tablet  0  . methylphenidate (METADATE ER) 20 MG ER tablet Take 1 tablet (20 mg total) by mouth daily.  30 tablet  0    BP 136/86  Pulse 72  Temp 98.2 F (36.8 C)  Resp 16  Ht 6' 2.5" (1.892 m)  Wt 258 lb (117.028 kg)  BMI 32.68 kg/m2        Objective:   Physical Exam  Nursing note and vitals reviewed. Constitutional: He appears well-developed and well-nourished.  HENT:  Head: Normocephalic and atraumatic.  Eyes: Conjunctivae are normal. Pupils are equal, round, and reactive to light.  Neck: Normal range of motion. Neck supple.  Cardiovascular: Normal rate and regular rhythm.   Pulmonary/Chest: Effort normal and breath sounds normal.  Abdominal: Soft. Bowel sounds are normal.          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

## 2011-07-09 ENCOUNTER — Telehealth: Payer: Self-pay | Admitting: *Deleted

## 2011-07-09 NOTE — Telephone Encounter (Signed)
Opened in error

## 2011-07-11 ENCOUNTER — Telehealth: Payer: Self-pay | Admitting: *Deleted

## 2011-07-11 NOTE — Telephone Encounter (Signed)
Pt called stating [facility] have told him that metadate is on manufacturer backorder. States his insurance told him that he can get Ritalin 20mg  as a substitute. [facility] and [facility] have it in stock.  Please advise.

## 2011-07-12 ENCOUNTER — Other Ambulatory Visit: Payer: Self-pay | Admitting: *Deleted

## 2011-07-12 ENCOUNTER — Telehealth: Payer: Self-pay | Admitting: Internal Medicine

## 2011-07-12 MED ORDER — METHYLPHENIDATE HCL 20 MG PO TABS: 20.0000 mg | ORAL_TABLET | Freq: Two times a day (BID) | ORAL | Status: DC

## 2011-07-12 NOTE — Telephone Encounter (Signed)
Error/njr °

## 2011-07-12 NOTE — Telephone Encounter (Signed)
1 month printed -pt aware ready for pic up

## 2011-10-08 ENCOUNTER — Ambulatory Visit: Payer: PRIVATE HEALTH INSURANCE | Admitting: Internal Medicine

## 2012-07-16 ENCOUNTER — Telehealth: Payer: Self-pay | Admitting: Neurology

## 2012-07-17 MED ORDER — SUMATRIPTAN SUCCINATE 100 MG PO TABS: 100.0000 mg | ORAL_TABLET | ORAL | Status: DC | PRN

## 2012-07-17 MED ORDER — TOPIRAMATE 25 MG PO TABS: 25.0000 mg | ORAL_TABLET | Freq: Every evening | ORAL | Status: DC

## 2012-07-17 NOTE — Telephone Encounter (Signed)
This patient has never been on Hydrocodone.  Beginning portion of this message must be an error.  I have refilled Imitrex and Topamax.

## 2012-08-06 ENCOUNTER — Telehealth: Payer: Self-pay | Admitting: Neurology

## 2012-08-06 NOTE — Telephone Encounter (Signed)
Dr. Juluis Rainier called concerning this patient. 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 The patient has not had a seizure in greater than 4 years. I would not restrict activities through this program.

## 2013-01-19 ENCOUNTER — Ambulatory Visit (INDEPENDENT_AMBULATORY_CARE_PROVIDER_SITE_OTHER): Payer: Medicare Other | Admitting: Neurology

## 2013-01-19 ENCOUNTER — Encounter (INDEPENDENT_AMBULATORY_CARE_PROVIDER_SITE_OTHER): Payer: Self-pay

## 2013-01-19 ENCOUNTER — Encounter: Payer: Self-pay | Admitting: Neurology

## 2013-01-19 VITALS — BP 118/80 | HR 60 | Wt 232.0 lb

## 2013-01-19 DIAGNOSIS — R569 Unspecified convulsions: Secondary | ICD-10-CM

## 2013-01-19 DIAGNOSIS — M1710 Unilateral primary osteoarthritis, unspecified knee: Secondary | ICD-10-CM

## 2013-01-19 DIAGNOSIS — G43009 Migraine without aura, not intractable, without status migrainosus: Secondary | ICD-10-CM | POA: Insufficient documentation

## 2013-01-19 MED ORDER — SUMATRIPTAN SUCCINATE 100 MG PO TABS: 100.0000 mg | ORAL_TABLET | ORAL | Status: DC | PRN

## 2013-01-19 MED ORDER — TOPIRAMATE 25 MG PO TABS: 25.0000 mg | ORAL_TABLET | Freq: Every evening | ORAL | Status: DC

## 2013-01-19 NOTE — Patient Instructions (Signed)
Migraine Headache A migraine headache is an intense, throbbing pain on one or both sides of your head. A migraine can last for 30 minutes to several hours. CAUSES  The exact cause of a migraine headache is not always known. However, a migraine may be caused when nerves in the brain become irritated and release chemicals that cause inflammation. This causes pain. SYMPTOMS  Pain on one or both sides of your head.  Pulsating or throbbing pain.  Severe pain that prevents daily activities.  Pain that is aggravated by any physical activity.  Nausea, vomiting, or both.  Dizziness.  Pain with exposure to bright lights, loud noises, or activity.  General sensitivity to bright lights, loud noises, or smells. Before you get a migraine, you may get warning signs that a migraine is coming (aura). An aura may include:  Seeing flashing lights.  Seeing bright spots, halos, or zig-zag lines.  Having tunnel vision or blurred vision.  Having feelings of numbness or tingling.  Having trouble talking.  Having muscle weakness. MIGRAINE TRIGGERS  Alcohol.  Smoking.  Stress.  Menstruation.  Aged cheeses.  Foods or drinks that contain nitrates, glutamate, aspartame, or tyramine.  Lack of sleep.  Chocolate.  Caffeine.  Hunger.  Physical exertion.  Fatigue.  Medicines used to treat chest pain (nitroglycerine), birth control pills, estrogen, and some blood pressure medicines. DIAGNOSIS  A migraine headache is often diagnosed based on:  Symptoms.  Physical examination.  A CT scan or MRI of your head. TREATMENT Medicines may be given for pain and nausea. Medicines can also be given to help prevent recurrent migraines.  HOME CARE INSTRUCTIONS  Only take over-the-counter or prescription medicines for pain or discomfort as directed by your caregiver. The use of long-term narcotics is not recommended.  Lie down in a dark, quiet room when you have a migraine.  Keep a journal  to find out what may trigger your migraine headaches. For example, write down:  What you eat and drink.  How much sleep you get.  Any change to your diet or medicines.  Limit alcohol consumption.  Quit smoking if you smoke.  Get 7 to 9 hours of sleep, or as recommended by your caregiver.  Limit stress.  Keep lights dim if bright lights bother you and make your migraines worse. SEEK IMMEDIATE MEDICAL CARE IF:   Your migraine becomes severe.  You have a fever.  You have a stiff neck.  You have vision loss.  You have muscular weakness or loss of muscle control.  You start losing your balance or have trouble walking.  You feel faint or pass out.  You have severe symptoms that are different from your first symptoms. MAKE SURE YOU:   Understand these instructions.  Will watch your condition.  Will get help right away if you are not doing well or get worse. Document Released: 02/11/2005 Document Revised: 05/06/2011 Document Reviewed: 02/01/2011 [employer] Patient Information 2014 [employer].  

## 2013-01-19 NOTE — Progress Notes (Signed)
    Reason for visit: Headache  Steven Vincent is an 47 y.o. male  History of present illness:  Steven Vincent is a 47 year old right-handed white male with a history of mild mental retardation, migraine headache, and a history of seizures. The patient has been treated with a very low dose of Topamax, currently on 25 mg at night. The patient has done quite well over the last year. The patient has on average one headache a month, and he indicates that the Imitrex tablets will help the headache, but it may take up to an hour to take effect. The patient has not had any recurrent seizures. The patient reports a chronic history of low back pain, and degenerative arthritis of the knees. The patient has had knee braces previously, but they have worn out. The patient had neoprene braces. The patient has had some left hip discomfort. The patient returns to this office for further evaluation.  Past Medical History  Diagnosis Date  . Personality disorder   . Knee pain   . Headache(784.0)   . Seizure disorder   . Migraines   . Low back pain     History reviewed. No pertinent past surgical history.  Family History  Problem Relation Age of Onset  . Heart disease Father   . Hyperlipidemia Father   . Hypertension Father   . Hypertension Brother     Social history:  reports that he quit smoking about 22 years ago. He does not have any smokeless tobacco history on file. He reports that he does not drink alcohol or use illicit drugs.    Allergies  Allergen Reactions  . Aspirin     GI upset    Medications:  No current outpatient prescriptions on file prior to visit.   No current facility-administered medications on file prior to visit.    ROS:  Out of a complete 14 system review of symptoms, the patient complains only of the following symptoms, and all other reviewed systems are negative.  Headache Left hip pain  Blood pressure 118/80, pulse 60, weight 232 lb (105.235 kg).  Physical  Exam  General: The patient is alert and cooperative at the time of the examination.  Neuromuscular: Range of movement of the cervical spine lacks about 10 of full lateral rotation bilaterally.  Skin: No significant peripheral edema is noted.   Neurologic Exam  Mental status: The patient is oriented x 3.  Cranial nerves: Facial symmetry is present. Speech is normal, no aphasia or dysarthria is noted. Extraocular movements are full. Visual fields are full.  Motor: The patient has good strength in all 4 extremities.  Sensory examination: Soft touch sensation is symmetric on the face, arms, and legs.  Coordination: The patient has good finger-nose-finger and heel-to-shin bilaterally.  Gait and station: The patient has a normal gait. Tandem gait is normal. Romberg is negative. No drift is seen.  Reflexes: Deep tendon reflexes are symmetric.   Assessment/Plan:  1. History of migraine  2. History of seizures  3. Mild mental retardation  4. Chronic low back pain  The patient overall is doing quite well. The patient will be continued on his Topamax, and Imitrex. Prescriptions for these medications were given. A prescription was given for neoprene knee braces. The patient will followup in one year.  Marlan Palau MD 01/19/2013 7:15 PM  [facility] [address] [place], [place] [address]  Phone [phone] Fax [phone]

## 2013-02-07 ENCOUNTER — Emergency Department (HOSPITAL_COMMUNITY)
Admission: EM | Admit: 2013-02-07 | Discharge: 2013-02-08 | Discharge status: Against Medical Advice | Payer: PRIVATE HEALTH INSURANCE | Attending: Emergency Medicine | Admitting: Emergency Medicine

## 2013-02-07 ENCOUNTER — Encounter (HOSPITAL_COMMUNITY): Payer: Self-pay | Admitting: Emergency Medicine

## 2013-02-07 DIAGNOSIS — K089 Disorder of teeth and supporting structures, unspecified: Secondary | ICD-10-CM | POA: Insufficient documentation

## 2013-02-07 DIAGNOSIS — Z87891 Personal history of nicotine dependence: Secondary | ICD-10-CM | POA: Insufficient documentation

## 2013-02-07 NOTE — ED Notes (Signed)
Pt called x2 from the lobby. No response.

## 2013-02-07 NOTE — ED Notes (Signed)
Pt arrived to the ED with dental pain.  Pt has left upper jaw.  Pt has no dentist and is in pain.

## 2013-03-08 NOTE — Telephone Encounter (Signed)
Chart opened in error

## 2013-04-19 ENCOUNTER — Telehealth: Payer: Self-pay | Admitting: *Deleted

## 2013-04-19 DIAGNOSIS — M170 Bilateral primary osteoarthritis of knee: Secondary | ICD-10-CM

## 2013-04-19 NOTE — Telephone Encounter (Signed)
I will rewrite the prescription for the new braces. I called the patient and I told him, and pick up the prescription.

## 2013-04-19 NOTE — Telephone Encounter (Signed)
Patient is requesting a new Rx for knee brace. Please advise.

## 2013-06-15 ENCOUNTER — Emergency Department (HOSPITAL_COMMUNITY): Payer: Medicare Other

## 2013-06-15 ENCOUNTER — Emergency Department (HOSPITAL_COMMUNITY)
Admission: EM | Admit: 2013-06-15 | Discharge: 2013-06-15 | Disposition: A | Discharge status: Home / Self Care | Payer: Medicare Other | Attending: Emergency Medicine | Admitting: Emergency Medicine

## 2013-06-15 DIAGNOSIS — M79609 Pain in unspecified limb: Secondary | ICD-10-CM

## 2013-06-15 DIAGNOSIS — G43909 Migraine, unspecified, not intractable, without status migrainosus: Secondary | ICD-10-CM | POA: Insufficient documentation

## 2013-06-15 DIAGNOSIS — M25561 Pain in right knee: Secondary | ICD-10-CM

## 2013-06-15 DIAGNOSIS — M1990 Unspecified osteoarthritis, unspecified site: Secondary | ICD-10-CM

## 2013-06-15 DIAGNOSIS — Z8659 Personal history of other mental and behavioral disorders: Secondary | ICD-10-CM | POA: Insufficient documentation

## 2013-06-15 DIAGNOSIS — G40909 Epilepsy, unspecified, not intractable, without status epilepticus: Secondary | ICD-10-CM | POA: Insufficient documentation

## 2013-06-15 DIAGNOSIS — M1710 Unilateral primary osteoarthritis, unspecified knee: Secondary | ICD-10-CM | POA: Insufficient documentation

## 2013-06-15 DIAGNOSIS — IMO0002 Reserved for concepts with insufficient information to code with codable children: Principal | ICD-10-CM

## 2013-06-15 DIAGNOSIS — Z87891 Personal history of nicotine dependence: Secondary | ICD-10-CM | POA: Insufficient documentation

## 2013-06-15 DIAGNOSIS — M25562 Pain in left knee: Secondary | ICD-10-CM

## 2013-06-15 DIAGNOSIS — G8929 Other chronic pain: Secondary | ICD-10-CM | POA: Insufficient documentation

## 2013-06-15 MED ORDER — HYDROCODONE-ACETAMINOPHEN 5-325 MG PO TABS: 1.0000 | ORAL_TABLET | Freq: Four times a day (QID) | ORAL | Status: DC | PRN

## 2013-06-15 NOTE — ED Provider Notes (Signed)
Medical screening examination/treatment/procedure(s) were conducted as a shared visit with non-physician practitioner(s) and myself.  I personally evaluated the patient during the encounter.   EKG Interpretation None      Pt c/o bil knee pain x several months. No abrupt change. Good rom bil knees without pain. No effusion. No erythema. Knees stable. Distal pulses palp. No leg swelling.   Mirna Mires, MD 06/15/13 [phone]

## 2013-06-15 NOTE — ED Provider Notes (Signed)
CSN: [id number]     Arrival date & time 06/15/13  1228 History  This chart was scribed for non-physician practitioner, Jamse Mead, PA-C working with Mirna Mires, MD by Frederich Balding, ED scribe. This patient was seen in room WTR5/WTR5 and the patient's care was started at 12:40 PM.   Chief Complaint  Patient presents with  . Knee Pain   The history is provided by the patient. No language interpreter was used.   HPI Comments: Steven Vincent is a 48 y.o. male with histroy of seizures and chronic bilateral knee pain who presents to the Emergency Department complaining of gradual onset, constant posterior right knee pain that started 2 months ago. Pt was diagnosed with arthritis in his bilateral knees in the 90's. Denies recent fall, injury or direct trauma. Bearing weight and certain movements worsen the pain. He has not taken any medications for the pain. Pt saw an orthopedist in February and was given new prescription braces for both knees but states he could not get them because Medicaid would not cover it. He also had an effusion in his left knee drained 06/04/13 but states his right knee was not addressed at that appointment. Denies fever, knee swelling, warm to touch, hip pain, numbness, tingling, loss of sensation, headache.   Past Medical History  Diagnosis Date  . Personality disorder   . Knee pain   . Headache(784.0)   . Seizure disorder   . Migraines   . Low back pain    No past surgical history on file. Family History  Problem Relation Age of Onset  . Heart disease Father   . Hyperlipidemia Father   . Hypertension Father   . Hypertension Brother    History  Substance Use Topics  . Smoking status: Former Smoker    Quit date: 04/26/1990  . Smokeless tobacco: Not on file     Comment: stopped in 1992  . Alcohol Use: No    Review of Systems  Constitutional: Negative for fever.  Musculoskeletal: Positive for arthralgias. Negative for joint swelling.  Neurological:  Negative for numbness and headaches.  All other systems reviewed and are negative.  Allergies  Aspirin  Home Medications   Prior to Admission medications   Medication Sig Start Date End Date Taking? Authorizing Provider  clonazePAM (KLONOPIN) 0.5 MG tablet Take 0.5 mg by mouth 2 (two) times daily as needed for anxiety.    Historical Provider, MD  SUMAtriptan (IMITREX) 100 MG tablet Take 100 mg by mouth 2 (two) times daily as needed for migraine (DO NOT EXCEED 2 TABLETS DAILY). 01/19/13   Kathrynn Ducking, MD  topiramate (TOPAMAX) 25 MG tablet Take 1 tablet (25 mg total) by mouth every evening. 01/19/13   Kathrynn Ducking, MD   BP 123/86  Pulse 80  Temp(Src) 98.3 F (36.8 C) (Oral)  Resp 16  SpO2 100%  Physical Exam  Nursing note and vitals reviewed. Constitutional: He is oriented to person, place, and time. He appears well-developed and well-nourished. No distress.  HENT:  Head: Normocephalic and atraumatic.  Eyes: Conjunctivae and EOM are normal. Pupils are equal, round, and reactive to light. Right eye exhibits no discharge. Left eye exhibits no discharge.  Neck: Normal range of motion. Neck supple. No tracheal deviation present.  Cardiovascular: Normal rate, regular rhythm and normal heart sounds.   Pulses:      Radial pulses are 2+ on the right side, and 2+ on the left side.  Dorsalis pedis pulses are 2+ on the right side, and 2+ on the left side.       Posterior tibial pulses are 2+ on the right side, and 2+ on the left side.  Cap refill less than 3 seconds  Negative swelling localized to lower extremities bilaterally Negative pitting edema bilaterally Positive Homans sign to the right lower extremity  Pulmonary/Chest: Effort normal and breath sounds normal. No respiratory distress. He has no wheezes. He has no rales.  Musculoskeletal:       Right knee: He exhibits decreased range of motion (flexion ). He exhibits no swelling, no effusion, no ecchymosis, no  deformity, no laceration, no erythema and normal alignment. Tenderness found.       Left knee: He exhibits normal range of motion, no swelling, no effusion, no ecchymosis, no deformity and no laceration. Tenderness found. Medial joint line and MCL tenderness noted. No patellar tendon tenderness noted.       Legs: Negative swelling, erythema, inflammation, lesions, sores, deformities noted to the knees bilaterally. Negative septic joint findings. Negative findings of cellulitis. Negative warmth upon palpation. Discomfort upon palpation to the posterior aspect of the right knee. Discomfort upon palpation to the medial anterior aspect of the left knee. Negative findings of effusion. Negative McMurray's sign bilaterally. Negative posterior and anterior draw sign bilaterally. Negative valgus and varus tension bilaterally. Full flexion and extension noted, most discomfort with extension to the right knee identified.  Lymphadenopathy:    He has no cervical adenopathy.  Neurological: He is alert and oriented to person, place, and time. No cranial nerve deficit. He exhibits normal muscle tone. Coordination normal.  Cranial nerves III-XII grossly intact Strength 5+/5+ to lower extremities bilaterally with resistance applied, equal distribution noted Strength intact to digits of the feet bilaterally with equal distribution Sensation intact with differentiation sharp and dull touch Gait proper, proper balance - negative sway, negative drift, negative step-offs  Skin: Skin is warm and dry.  Psychiatric: He has a normal mood and affect. His behavior is normal.    ED Course  Procedures (including critical care time)  DIAGNOSTIC STUDIES: Oxygen Saturation is 100% on RA, normal by my interpretation.    COORDINATION OF CARE: 12:48 PM-Discussed treatment plan which includes xray with pt at bedside and pt agreed to plan.   Labs Review Labs Reviewed - No data to display  Imaging Review Dg Knee Complete 4  Views Left  06/15/2013   CLINICAL DATA:  Chronic knee pain  EXAM: LEFT KNEE - COMPLETE 4+ VIEW  COMPARISON:  06/15/2013  FINDINGS: Normal alignment without fracture. Preserved joint spaces. No significant arthropathy demonstrated in the left knee. On the lateral view, a small effusion is suspected.  IMPRESSION: No acute osseous finding.  Question small left knee joint effusion   Electronically Signed   By: Daryll Brod M.D.   On: 06/15/2013 13:22   Dg Knee Complete 4 Views Right  06/15/2013   CLINICAL DATA:  Bilateral knee pain, injury  EXAM: RIGHT KNEE - COMPLETE 4+ VIEW  COMPARISON:  06/15/2013  FINDINGS: Normal alignment without fracture or effusion. Preserved joint spaces. No significant arthropathy or degenerative process. No soft tissue abnormality.  IMPRESSION: No acute osseous finding.   Electronically Signed   By: Daryll Brod M.D.   On: 06/15/2013 13:24     EKG Interpretation None      MDM   Final diagnoses:  Bilateral knee pain  Arthritis    Filed Vitals:   06/15/13 1241 06/15/13  1505 06/15/13 1510  BP: 148/83  123/86  Pulse: 83  80  Temp:  98.3 F (36.8 C)   TempSrc: Oral Oral   Resp: 16  16  SpO2: 100%  100%    I personally performed the services described in this documentation, which was scribed in my presence. The recorded information has been reviewed and is accurate.  Patient presenting to the ED with bilateral knee pain that is been ongoing for the past couple of months. Reported the most discomfort is localized to the right knee that started back in February 2015. Reported that the pain is localized to the posterior aspect of his right knee described as a "ouch"-reported that the pain is worse with applying pressure to the right knee, reports a stiffening sensation in the morning. Stated that he is been seen and assessed with orthopedics, reported back in 06/04/2013 he had his left knee drains with approximately 70 cc removed from his left knee as per patient  report. Patient reported that he is no longer able to go back to the orthopedic secondary to not paying his co-pay. Stated that he's been having mild swelling to the right knee, and sensitive to motion. Reported that he has not been using anything for pain. Denied recent fall or injury. Alert and oriented. GCS 15. Heart rate and rhythm normal. Lungs clear to auscultation. Radial and DP pulses 2+ bilaterally. Cap refill less than 3 seconds. Negative swelling, erythema, inflammation, lesions, sores, deformities, malalignment identified to the knees bilaterally. Discomfort upon palpation to the posterior aspect of the right knee with discomfort upon palpation to the medial anterior aspect of the left knee. Full range of motion noted to the left knee without difficulty or ataxia. Mild discomfort upon flexion of the right knee. Negative posterior and anterior draw sign bilaterally. Negative valgus and varus tension bilaterally. Negative McMurray sign bilaterally. Strength intact with equal distribution. Strength intact to digits of the feet bilaterally with equal distribution. Sensation intact. Right knee plain film negative for acute osseous injury preserved joint spaces, no significant arthropathy or degenerative processes, no soft tissue abnormality noted. Left knee plain film normal alignment without acute osseous injury, small joint effusion mildly suspected. Doppler of right lower extremity negative for DVT, negative Baker's cyst. Doubt septic joint. Doubt DVT. Suspicion of discomfort to knees bilaterally to be arthritis. Negative focal neurological deficits noted. Discussed case with attending physician recommended patient to be discharged with a small amount of hydrocodone. Patient stable, afebrile. Patient hemodynamically stable patient stable for discharge. Discharge patient. Patient placed in knee sleeves bilaterally. Referred patient to orthopedics and primary care provider. Discharge patient with a small  dose of pain medications-discussed course, cautions, disposal technique. Discussed with patient to massage, water therapy. Discussed with patient to closely monitor symptoms and if symptoms are to worsen or change to report back to the ED - strict return instructions given.  Patient agreed to plan of care, understood, all questions answered.   Jamse Mead, PA-C 06/15/13 1515

## 2013-06-15 NOTE — ED Notes (Signed)
Charge called [facility] vascular tech, there is not a vascular tech at [facility] today, [facility] will send one over shortly

## 2013-06-15 NOTE — Progress Notes (Signed)
VASCULAR LAB PRELIMINARY  PRELIMINARY  PRELIMINARY  PRELIMINARY  Right lower extremity venous duplex completed.    Preliminary report:  Right:  No evidence of DVT, superficial thrombosis, or Baker's cyst.  Nani Ravens, RVT 06/15/2013, 3:03 PM

## 2013-06-15 NOTE — ED Notes (Signed)
Ortho tech called for application of knee sleeve.  

## 2013-06-15 NOTE — Discharge Instructions (Signed)
Please call your doctor for a followup appointment within 24-48 hours. When you talk to your doctor please let them know that you were seen in the emergency department and have them acquire all of your records so that they can discuss the findings with you and formulate a treatment plan to fully care for your new and ongoing problems. Please call and set-up an appointment with primary care provider to be seen and re-assessed Please call and set-up an appointment with orthopedics to be assessed regarding ongoing bilateral knee pain Please rest and stay hydrated While on pain medications his be no drink alcohol, driving, operating any heavy machinery if there is extra please disposer proper manner. Please do not take any extra Tylenol for this can lead to Tylenol overdose and liver failure. Please avoid any physical or strenuous activity. Please increase exercise for this can be to strengthening of the muscles Please continue to monitor symptoms closely and if symptoms are to worsen or change (fever greater than 101, chills, chest pain, shortness of breath, difficulty breathing, numbness, tingling, worsening pain, fall, injury, inability to walk, urinary bowel continence, headache, dizziness, swelling to the knees, redness to the knees, hot to the touch) please report back to the ED immediately   Arthritis, Nonspecific Arthritis is inflammation of a joint. This usually means pain, redness, warmth or swelling are present. One or more joints may be involved. There are a number of types of arthritis. Your caregiver may not be able to tell what type of arthritis you have right away. CAUSES  The most common cause of arthritis is the wear and tear on the joint (osteoarthritis). This causes damage to the cartilage, which can break down over time. The knees, hips, back and neck are most often affected by this type of arthritis. Other types of arthritis and common causes of joint pain include:  Sprains and  other injuries near the joint. Sometimes minor sprains and injuries cause pain and swelling that develop hours later.  Rheumatoid arthritis. This affects hands, feet and knees. It usually affects both sides of your body at the same time. It is often associated with chronic ailments, fever, weight loss and general weakness.  Crystal arthritis. Gout and pseudo gout can cause occasional acute severe pain, redness and swelling in the foot, ankle, or knee.  Infectious arthritis. Bacteria can get into a joint through a break in overlying skin. This can cause infection of the joint. Bacteria and viruses can also spread through the blood and affect your joints.  Drug, infectious and allergy reactions. Sometimes joints can become mildly painful and slightly swollen with these types of illnesses. SYMPTOMS   Pain is the main symptom.  Your joint or joints can also be red, swollen and warm or hot to the touch.  You may have a fever with certain types of arthritis, or even feel overall ill.  The joint with arthritis will hurt with movement. Stiffness is present with some types of arthritis. DIAGNOSIS  Your caregiver will suspect arthritis based on your description of your symptoms and on your exam. Testing may be needed to find the type of arthritis:  Blood and sometimes urine tests.  X-ray tests and sometimes CT or MRI scans.  Removal of fluid from the joint (arthrocentesis) is done to check for bacteria, crystals or other causes. Your caregiver (or a specialist) will numb the area over the joint with a local anesthetic, and use a needle to remove joint fluid for examination. This procedure is  only minimally uncomfortable.  Even with these tests, your caregiver may not be able to tell what kind of arthritis you have. Consultation with a specialist (rheumatologist) may be helpful. TREATMENT  Your caregiver will discuss with you treatment specific to your type of arthritis. If the specific type cannot  be determined, then the following general recommendations may apply. Treatment of severe joint pain includes:  Rest.  Elevation.  Anti-inflammatory medication (for example, ibuprofen) may be prescribed. Avoiding activities that cause increased pain.  Only take over-the-counter or prescription medicines for pain and discomfort as recommended by your caregiver.  Cold packs over an inflamed joint may be used for 10 to 15 minutes every hour. Hot packs sometimes feel better, but do not use overnight. Do not use hot packs if you are diabetic without your caregiver's permission.  A cortisone shot into arthritic joints may help reduce pain and swelling.  Any acute arthritis that gets worse over the next 1 to 2 days needs to be looked at to be sure there is no joint infection. Long-term arthritis treatment involves modifying activities and lifestyle to reduce joint stress jarring. This can include weight loss. Also, exercise is needed to nourish the joint cartilage and remove waste. This helps keep the muscles around the joint strong. HOME CARE INSTRUCTIONS   Do not take aspirin to relieve pain if gout is suspected. This elevates uric acid levels.  Only take over-the-counter or prescription medicines for pain, discomfort or fever as directed by your caregiver.  Rest the joint as much as possible.  If your joint is swollen, keep it elevated.  Use crutches if the painful joint is in your leg.  Drinking plenty of fluids may help for certain types of arthritis.  Follow your caregiver's dietary instructions.  Try low-impact exercise such as:  Swimming.  Water aerobics.  Biking.  Walking.  Morning stiffness is often relieved by a warm shower.  Put your joints through regular range-of-motion. SEEK MEDICAL CARE IF:   You do not feel better in 24 hours or are getting worse.  You have side effects to medications, or are not getting better with treatment. SEEK IMMEDIATE MEDICAL CARE  IF:   You have a fever.  You develop severe joint pain, swelling or redness.  Many joints are involved and become painful and swollen.  There is severe back pain and/or leg weakness.  You have loss of bowel or bladder control. Document Released: 03/21/2004 Document Revised: 05/06/2011 Document Reviewed: 04/06/2008 [facility] Patient Information 2014 [place].   Emergency Department Resource Guide 1) Find a Doctor and Pay Out of Pocket Although you won't have to find out who is covered by your insurance plan, it is a good idea to ask around and get recommendations. You will then need to call the office and see if the doctor you have chosen will accept you as a new patient and what types of options they offer for patients who are self-pay. Some doctors offer discounts or will set up payment plans for their patients who do not have insurance, but you will need to ask so you aren't surprised when you get to your appointment.  2) Contact Your Local Health Department Not all health departments have doctors that can see patients for sick visits, but many do, so it is worth a call to see if yours does. If you don't know where your local health department is, you can check in your phone book. The CDC also has a tool to help  you locate your state's health department, and many state websites also have listings of all of their local health departments.  3) Find a [facility] If your illness is not likely to be very severe or complicated, you may want to try a walk in clinic. These are popping up all over the country in pharmacies, drugstores, and shopping centers. They're usually staffed by nurse practitioners or physician assistants that have been trained to treat common illnesses and complaints. They're usually fairly quick and inexpensive. However, if you have serious medical issues or chronic medical problems, these are probably not your best option.  No Primary Care Doctor: - Call Health  Connect at  [phone] - they can help you locate a primary care doctor that  accepts your insurance, provides certain services, etc. - Physician Referral Service- [phone]  Chronic Pain Problems: Organization         Address  Phone   Notes  [facility]  [phone] Patients need to be referred by their primary care doctor.   Medication Assistance: Organization         Address  Phone   Notes  [facility] Medication [facility] [address] [place], [place] [address] [phone] --Must be a resident of [facility] -- Must have NO insurance coverage whatsoever (no Medicaid/ Medicare, etc.) -- The pt. MUST have a primary care doctor that directs their care regularly and follows them in the community   Med[facility]ist  [phone]   [employer]  [phone]    Agencies that provide inexpensive medical care: Organization         Address  Phone   Notes  [place]  [phone]   [facility]    [phone]   [facility] [place], [place] [address] [phone]   [place] [address], [place] [phone]   Planned Parenthood    [phone]   [facility]    [phone]   [place] and [address], [place] Phone:  [phone], Fax:  [phone] Hours of Operation:  9 am - 6 pm, M-F.  Also accepts Medicaid/Medicare and self-pay.  [facility] [place], [address], [place] Phone: [phone], Fax: [phone]. Hours of Operation:  8:30 am - 5:30 pm, M-F.  Also accepts Medicaid and self-pay.  [facility] [place] [address] [place] Phone: [phone]   [place], [place], [place] [phone], Ext. [phone] Mondays & Thursdays: 7-9 AM.  First 15 patients are seen on a first come, first serve basis.     [place] Providers:  Organization         Address  Phone   Notes  [facility] [address], [place] [phone] Also accepts self-pay patients.  [facility] [address], [place]  [phone]   [place], [address], [place] [phone]   [facility] [address], [place] [phone]   Lucianne Lei [address], [place]   [phone] Only accepts [place] Access [place] patients after they have their name applied to their card.   Self-Pay (no insurance) in [facility]:  Organization  Address  Phone   Notes  Sickle Cell Patients, [facility] Internal [facility] [place] [phone]   [facility] [place] [phone]   [facility] [place]  [place], [address], [place] [phone]   [facility]/Dr. Osei-Bonsu  [address] [place] or [address], [place] [phone] Phone number for both [place] and [place] locations is the same.  [facility] [address], [place] [phone]   [facility] [address], [place] or [address] [phone]   [facility] [address] [place] [phone], phone; [phone], fax Sees patients 1st and 3rd Saturday of every month.  Must not qualify for public or private insurance (i.e. Medicaid, Medicare, [place] Health Choice, Veterans' Benefits)  Household income should be no more than 200% of the poverty level The clinic cannot treat you if you are pregnant or think you are pregnant  Sexually transmitted diseases are not treated at the clinic.    Dental Care: Organization         Address  Phone  Notes  [facility]  Department of [facility] [phone] Accepts children up to age 62 who are enrolled in [place] or [place]; pregnant women with a Medicaid card; and children who have applied for Medicaid or [place] Health Choice, but were declined, whose parents can pay a reduced fee at time of service.  [facility] Department of [facility]  [address], [place] [phone] Accepts children up to age 70 who are enrolled in [place] or [place]; pregnant women with a Medicaid card; and children who have applied for Medicaid or [place] Health Choice, but were declined, whose parents can pay a reduced fee at time of service.  [place] Adult [facility] Access PROGRAM  [place] [phone] Patients are seen by appointment only. Walk-ins are not accepted. [place] will see patients 15 years of age and older. Monday - Tuesday (8am-5pm) Most Wednesdays (8:30-5pm) $30 per visit, cash only  [facility] Adult Dental Access PROGRAM  [address], [facility] [phone] Patients are seen by appointment only. Walk-ins are not accepted. [place] will see patients 45 years of age and older. One Wednesday Evening (Monthly: Volunteer Based).  $30 per visit, cash only  [place]  [phone] for adults; Children under age 51, call Graduate Pediatric Dentistry at [phone]. Children aged 69-14, please call [phone] to request a pediatric application.  Dental services are provided in all areas of dental care including fillings, crowns and bridges, complete and partial dentures, implants, gum treatment, root canals, and extractions. Preventive care is also provided. Treatment is provided to both adults and children. Patients are selected via a lottery and there is often a waiting list.   [facility] [address], [place]  [phone]  [email or web address]   [facility] [address] [place], [place] [phone], Ext. [phone] Second and Fourth Thursday of each month, opens at 6:30 AM; Clinic ends at 9 AM.  Patients are seen on a first-come first-served basis, and a limited number are seen during each clinic.   [facility]  [address] [place], [place]  [place], [place] [phone]   Eligibility Requirements You must have lived in [place], [place], or [place] counties for at least the last three months.   You cannot be eligible for state or federal sponsored [employer], including [employer], [place], or [employer].   You generally cannot be eligible for healthcare insurance through your employer.    How to apply: Eligibility screenings are held every Tuesday and Wednesday afternoon from 1:00 pm until 4:00 pm. You do not need an appointment for the interview!  [facility] [address] [place], [place]   [place]  [place] Department  [place]  [phone]    Behavioral Health Resources in the Community: Intensive Outpatient Programs Organization         Address  Phone  Notes  Ione Langeloth. [address] [place], [place] [phone]   [facility] [address] [place], [place]   ADS: Alcohol & Drug Svcs [address] [place], [place]   [place] [address],  [place], [place] or [phone]   Substance Abuse Resources Organization         Address  Phone  Notes  Alcohol and Drug Services  [phone]   [place]  [phone]   [facility] [place]   Chinita Pester  [phone]   Residential & Outpatient Substance Abuse Program  [phone]   Psychological Services Organization          Address  Phone  Notes  [facility]  [place]  [phone]   [place] [address], [place] or [phone]    Mobile Crisis Teams Organization         Address  Phone  Notes  [facility]atives, Mobile Crisis Care Unit  [phone]   [facility]ive Psychotherapeutic Services  [address] [place], [place]   [place] Levels [address], [place] [phone]    Self-Help/Support Groups Organization         Address  Phone             Notes  [place]. of [place] - variety of support groups  [place] Call for more information  Narcotics Anonymous (NA), Caring Services [address], [employer] [place]  2 meetings at this location   Special educational needs teacher         Address  Phone  Notes  ASAP Residential Treatment [place],    Susan Moore  [phone]   [facility]  [address] [place] [phone], [place], [place]   [place], [place] [phone] Admissions: 8am-3pm M-F  Incentives Substance [place] [address]    [place], [place] [phone]   [facility] [address] Jadene Pierini [place], [place]   [facility] [address]  [place], [place]   [facility]ight Programs - Intensive Outpatient [address] Krist[address], [place], [place]   [facility] ([place].) [place].,  [place], [place] or [phone]   [facility]idential Treatment Services (RTS) [address], [place], [place] Accepts Medicaid  [facility] [place] [address],  [place] 1-2018592217 Substance Abuse/Addiction Treatment   [facility] Resources Organization  Address  Phone  Notes  [facility]  [phone]   Domenic Schwab, PhD [address] Arlis Porta [place], [place]   [phone] or [phone]   Rufus Wilcox [place], [place] [phone]   [address], [place], [place] [phone] Insurance/Medicaid/sponsorship through [place] [facility] and Families [address]                                    [place], [place] [phone] [place] [address], [place] [phone]    Dr. Adele Schilder  [phone]   [facility] Dept. 1) [address] [place] 2) [place] 3)  [place] [address], [place] [phone]  [phone]   [place] [phone] or [phone] (After Hours)

## 2013-06-15 NOTE — ED Notes (Signed)
Pt has been seen by sports ortho for bilat knee pain. Pt has posterior right  knee pain. Pt had 70cc taken off left knee on 06/04/2013. Pt had bilat xrays on both knees on 06/04/2013 but ortho just treated left knee. Pt states he did not go back there because they wanted another co-pay and he does not have the money and also feels like they should have addressed the right knee at the time.

## 2013-06-15 NOTE — ED Notes (Signed)
Ortho tech at bedside for application of knee sleeves.

## 2013-07-27 ENCOUNTER — Other Ambulatory Visit: Payer: Self-pay | Admitting: Neurology

## 2013-07-27 ENCOUNTER — Telehealth: Payer: Self-pay | Admitting: Neurology

## 2013-07-27 DIAGNOSIS — M1710 Unilateral primary osteoarthritis, unspecified knee: Secondary | ICD-10-CM

## 2013-07-27 NOTE — Telephone Encounter (Signed)
Patient requesting another copy of his prescription for knee brace. Please notify him when it is ready.

## 2013-07-27 NOTE — Telephone Encounter (Signed)
Spoke with Dr. Jannifer Franklin and the doctor printed the Rx again for the pt and the pt picked up the Rx today.

## 2013-09-02 ENCOUNTER — Other Ambulatory Visit: Payer: Self-pay | Admitting: Neurology

## 2014-01-19 ENCOUNTER — Ambulatory Visit (INDEPENDENT_AMBULATORY_CARE_PROVIDER_SITE_OTHER): Payer: Medicare Other | Admitting: Neurology

## 2014-01-19 ENCOUNTER — Encounter: Payer: Self-pay | Admitting: Neurology

## 2014-01-19 VITALS — BP 136/90 | HR 56 | Ht 74.0 in | Wt 242.8 lb

## 2014-01-19 DIAGNOSIS — R569 Unspecified convulsions: Secondary | ICD-10-CM

## 2014-01-19 DIAGNOSIS — G43709 Chronic migraine without aura, not intractable, without status migrainosus: Secondary | ICD-10-CM

## 2014-01-19 DIAGNOSIS — Z5181 Encounter for therapeutic drug level monitoring: Secondary | ICD-10-CM

## 2014-01-19 MED ORDER — TOPIRAMATE 25 MG PO TABS: 50.0000 mg | ORAL_TABLET | Freq: Every evening | ORAL | Status: DC

## 2014-01-19 MED ORDER — MELOXICAM 15 MG PO TABS: 15.0000 mg | ORAL_TABLET | Freq: Every day | ORAL | Status: DC

## 2014-01-19 NOTE — Patient Instructions (Signed)

## 2014-01-19 NOTE — Progress Notes (Signed)
Reason for visit: Seizures  Steven Vincent is an 48 y.o. male  History of present illness:  Steven Vincent is a 48 year old right-handed white male with a history of a seizure disorder, and history of migraine headaches. He indicates that he is doing quite well with his migraines, and he rarely has any problems in this regard. Recently, he has noted some increase in the number of myoclonic jerks that he is having during the day, and he notes that they are generally a bit worse in the evening hours when he is trying to relax. The patient may have some jerking at night while sleeping. He also continues to have a lot of issues with arthritis affecting his hands and knees. The patient has taken Naprosyn for some time, but he was told come off of the medication because it may affect his stomach. He has been seeing a chiropractor for low back pain and left-sided sciatica, but his chiropractor when out of business, and his back issues have worsened. He returns to this office for an evaluation.  Past Medical History  Diagnosis Date  . Personality disorder   . Knee pain   . Headache(784.0)   . Seizure disorder   . Migraines   . Low back pain     History reviewed. No pertinent past surgical history.  Family History  Problem Relation Age of Onset  . Heart disease Father   . Hyperlipidemia Father   . Hypertension Father   . Hypertension Brother     Social history:  reports that he quit smoking about 23 years ago. He has never used smokeless tobacco. He reports that he does not drink alcohol or use illicit drugs.    Allergies  Allergen Reactions  . Aspirin     GI upset    Medications:  Current Outpatient Prescriptions on File Prior to Visit  Medication Sig Dispense Refill  . Potassium 99 MG TABS Take 1 tablet by mouth every evening.    . SUMAtriptan (IMITREX) 100 MG tablet TAKE 1 TABLET (100 MG TOTAL) BY MOUTH AS NEEDED FOR MIGRAINE (DO NOT EXCEED 2 TABLETS DAILY). 27 tablet 1   No  current facility-administered medications on file prior to visit.    ROS:  Out of a complete 14 system review of symptoms, the patient complains only of the following symptoms, and all other reviewed systems are negative.  Joint pain, joint swelling, back pain, muscle cramps Myoclonic jerks  Blood pressure 136/90, pulse 56, height 6\' 2"  (1.88 m), weight 242 lb 12.8 oz (110.133 kg).  Physical Exam  General: The patient is alert and cooperative at the time of the examination.  Skin: No significant peripheral edema is noted.   Neurologic Exam  Mental status: The patient is oriented x 3.  Cranial nerves: Facial symmetry is present. Speech is normal, no aphasia or dysarthria is noted. Extraocular movements are full. Visual fields are full.  Motor: The patient has good strength in all 4 extremities.  Sensory examination: Soft touch sensation is symmetric on the face, arms, and legs.  Coordination: The patient has good finger-nose-finger and heel-to-shin bilaterally.  Gait and station: The patient has a normal gait. Tandem gait is normal. [place] is negative. No drift is seen.  Reflexes: Deep tendon reflexes are symmetric.   Assessment/Plan:  1. History of seizures  2. Degenerative arthritis  3. Chronic low back pain, left-sided sciatica  4. History of migraine  The patient has had some increase in his myoclonic jerks  recently, we will go up on the Topamax taking 50 mg at night. He is doing quite well with his migraine headache, but he mainly is concerned about the arthritic pain and low back pain that has worsened off of the Naprosyn. The patient will have blood work done today, and a prescription for Motrin will be given. He will follow-up in one year or as needed. The patient indicates that he is seeing a sports medicine doctor at this time.  Jill Alexanders MD 01/19/2014 7:22 PM  [facility] [address] [place], [place]  [address]  Phone [phone] Fax [phone]

## 2014-01-20 LAB — CBC WITH DIFFERENTIAL
Basophils Absolute: 0 10*3/uL (ref 0.0–0.2)
Basos: 0 %
Eos: 5 %
Eosinophils Absolute: 0.3 10*3/uL (ref 0.0–0.4)
HCT: 43.2 % (ref 37.5–51.0)
Hemoglobin: 14.9 g/dL (ref 12.6–17.7)
Immature Grans (Abs): 0 10*3/uL (ref 0.0–0.1)
Immature Granulocytes: 0 %
Lymphocytes Absolute: 2.2 10*3/uL (ref 0.7–3.1)
Lymphs: 35 %
MCH: 31.8 pg (ref 26.6–33.0)
MCHC: 34.5 g/dL (ref 31.5–35.7)
MCV: 92 fL (ref 79–97)
Monocytes Absolute: 0.6 10*3/uL (ref 0.1–0.9)
Monocytes: 9 %
Neutrophils Absolute: 3.3 10*3/uL (ref 1.4–7.0)
Neutrophils Relative %: 51 %
Platelets: 206 10*3/uL (ref 150–379)
RBC: 4.68 x10E6/uL (ref 4.14–5.80)
RDW: 13.3 % (ref 12.3–15.4)
WBC: 6.4 10*3/uL (ref 3.4–10.8)

## 2014-01-20 LAB — COMPREHENSIVE METABOLIC PANEL
ALT: 47 IU/L — ABNORMAL HIGH (ref 0–44)
AST: 70 IU/L — ABNORMAL HIGH (ref 0–40)
Albumin/Globulin Ratio: 2 (ref 1.1–2.5)
Albumin: 4.7 g/dL (ref 3.5–5.5)
Alkaline Phosphatase: 83 IU/L (ref 39–117)
BUN/Creatinine Ratio: 21 — ABNORMAL HIGH (ref 9–20)
BUN: 18 mg/dL (ref 6–24)
CO2: 23 mmol/L (ref 18–29)
Calcium: 9.3 mg/dL (ref 8.7–10.2)
Chloride: 103 mmol/L (ref 97–108)
Creatinine, Ser: 0.85 mg/dL (ref 0.76–1.27)
GFR calc Af Amer: 119 mL/min/{1.73_m2} (ref >59)
GFR calc non Af Amer: 103 mL/min/{1.73_m2} (ref >59)
Globulin, Total: 2.3 g/dL (ref 1.5–4.5)
Glucose: 94 mg/dL (ref 65–99)
Potassium: 4.3 mmol/L (ref 3.5–5.2)
Sodium: 140 mmol/L (ref 134–144)
Total Bilirubin: 0.7 mg/dL (ref 0.0–1.2)
Total Protein: 7 g/dL (ref 6.0–8.5)

## 2014-01-21 ENCOUNTER — Telehealth: Payer: Self-pay | Admitting: Neurology

## 2014-01-21 NOTE — Telephone Encounter (Signed)
I called the patient. The blood work was unremarkable with the exception that the liver enzymes were minimally elevated. I discussed this with the patient.

## 2014-03-05 ENCOUNTER — Telehealth: Payer: Self-pay | Admitting: Neurology

## 2014-03-05 NOTE — Telephone Encounter (Signed)
I called the patient. He is claiming that the Mobic is causing mood swings. This is not listed as a significant side effect of this medication. However, the patient is to stop the medication. He is on Lamictal already for emotional lability. I suspect that this is the underlying issue, not the Mobic.

## 2014-03-10 ENCOUNTER — Telehealth: Payer: Self-pay | Admitting: Neurology

## 2014-03-10 DIAGNOSIS — R569 Unspecified convulsions: Secondary | ICD-10-CM

## 2014-03-10 DIAGNOSIS — G43709 Chronic migraine without aura, not intractable, without status migrainosus: Secondary | ICD-10-CM

## 2014-03-10 MED ORDER — TOPIRAMATE 25 MG PO TABS: 50.0000 mg | ORAL_TABLET | Freq: Every evening | ORAL | Status: DC

## 2014-03-10 MED ORDER — SUMATRIPTAN SUCCINATE 100 MG PO TABS: ORAL_TABLET | ORAL | Status: DC

## 2014-03-10 MED ORDER — DICLOFENAC POTASSIUM 50 MG PO TABS: 50.0000 mg | ORAL_TABLET | Freq: Two times a day (BID) | ORAL | Status: DC

## 2014-03-10 NOTE — Telephone Encounter (Signed)
Pt states he needs all of his medications faxed to [facility].  If you have any questions please call and advise. The best number to reach Pt is [phone].

## 2014-03-10 NOTE — Telephone Encounter (Signed)
I called back and spoke with the patient.  Says since he discontinued Mobic, he has been experiencing leg pain.  He would like to know if the provider would be able to prescribe something else in it's place.  Patient prefers a generic drug.  If permissible, he would like one month of new med at local pharmacy, then he will get future fills through mail order.  Says his new policy allows him to get Tier 1 drugs at no cost via mail order.   Also wanted to advise the Dr Casimiro Needle has taken him off of Lamictal and started him on Clonazepam 0.5mg  and Clorazepate 3.75mg .

## 2014-03-10 NOTE — Telephone Encounter (Signed)
I called the patient. The mobic is too expensive. We will give him a trial on diclofenac instead taking 50 mg twice daily. If this works out well, we will call in a 90 day prescription for him.

## 2014-04-06 DIAGNOSIS — R251 Tremor, unspecified: Secondary | ICD-10-CM | POA: Diagnosis not present

## 2014-04-06 DIAGNOSIS — M25561 Pain in right knee: Secondary | ICD-10-CM | POA: Diagnosis not present

## 2014-04-06 DIAGNOSIS — F21 Schizotypal disorder: Secondary | ICD-10-CM | POA: Diagnosis not present

## 2014-04-06 DIAGNOSIS — G40909 Epilepsy, unspecified, not intractable, without status epilepticus: Secondary | ICD-10-CM | POA: Diagnosis not present

## 2014-04-15 DIAGNOSIS — M545 Low back pain: Secondary | ICD-10-CM | POA: Diagnosis not present

## 2014-04-15 DIAGNOSIS — M170 Bilateral primary osteoarthritis of knee: Secondary | ICD-10-CM | POA: Diagnosis not present

## 2014-04-21 DIAGNOSIS — F2089 Other schizophrenia: Secondary | ICD-10-CM | POA: Diagnosis not present

## 2014-04-26 DIAGNOSIS — H524 Presbyopia: Secondary | ICD-10-CM | POA: Diagnosis not present

## 2014-04-26 DIAGNOSIS — H5210 Myopia, unspecified eye: Secondary | ICD-10-CM | POA: Diagnosis not present

## 2014-04-27 ENCOUNTER — Telehealth: Payer: Self-pay | Admitting: *Deleted

## 2014-04-27 MED ORDER — MELOXICAM 15 MG PO TABS: 15.0000 mg | ORAL_TABLET | Freq: Every day | ORAL | Status: DC

## 2014-04-27 NOTE — Telephone Encounter (Signed)
Patient calling about diclofenac potassium 50 mg is not working. The patient wants to know if he could go back on what he was taking previously. The patient can not remember what the medication was but wants to start it back on it. He also stated that there was a new med out on the market that he would not mind trying if the physician thinks ok. The patient is also buspirone 10 mg. Please call patient and advise him on what he should do. Patient goes to work at 5 but is advise of the 43-83 hour policy.

## 2014-04-27 NOTE — Telephone Encounter (Signed)
I called the patient. He indicates that the diclofenac is not working, I will switch him back to the Mobic which was helping, he claimed that there were mood swings on the medication, this was not listed side effect of the drug.

## 2014-05-17 DIAGNOSIS — M9904 Segmental and somatic dysfunction of sacral region: Secondary | ICD-10-CM | POA: Diagnosis not present

## 2014-05-17 DIAGNOSIS — M9902 Segmental and somatic dysfunction of thoracic region: Secondary | ICD-10-CM | POA: Diagnosis not present

## 2014-05-17 DIAGNOSIS — M9905 Segmental and somatic dysfunction of pelvic region: Secondary | ICD-10-CM | POA: Diagnosis not present

## 2014-05-17 DIAGNOSIS — M9903 Segmental and somatic dysfunction of lumbar region: Secondary | ICD-10-CM | POA: Diagnosis not present

## 2014-05-17 DIAGNOSIS — M5432 Sciatica, left side: Secondary | ICD-10-CM | POA: Diagnosis not present

## 2014-05-17 DIAGNOSIS — M791 Myalgia: Secondary | ICD-10-CM | POA: Diagnosis not present

## 2014-05-18 DIAGNOSIS — M9902 Segmental and somatic dysfunction of thoracic region: Secondary | ICD-10-CM | POA: Diagnosis not present

## 2014-05-18 DIAGNOSIS — M5432 Sciatica, left side: Secondary | ICD-10-CM | POA: Diagnosis not present

## 2014-05-18 DIAGNOSIS — M791 Myalgia: Secondary | ICD-10-CM | POA: Diagnosis not present

## 2014-05-18 DIAGNOSIS — M9905 Segmental and somatic dysfunction of pelvic region: Secondary | ICD-10-CM | POA: Diagnosis not present

## 2014-05-18 DIAGNOSIS — M9903 Segmental and somatic dysfunction of lumbar region: Secondary | ICD-10-CM | POA: Diagnosis not present

## 2014-05-18 DIAGNOSIS — M9904 Segmental and somatic dysfunction of sacral region: Secondary | ICD-10-CM | POA: Diagnosis not present

## 2014-05-27 DIAGNOSIS — F2089 Other schizophrenia: Secondary | ICD-10-CM | POA: Diagnosis not present

## 2014-06-22 ENCOUNTER — Telehealth: Payer: Self-pay | Admitting: Neurology

## 2014-06-22 MED ORDER — MELOXICAM 7.5 MG PO TABS: 7.5000 mg | ORAL_TABLET | Freq: Two times a day (BID) | ORAL | Status: DC

## 2014-06-22 NOTE — Telephone Encounter (Signed)
I called patient. 15 mg daily of Mobic is the maximum daily dose. He wishes to take something twice a day, I will call him 7.5 mg tablets of this medication.

## 2014-06-22 NOTE — Telephone Encounter (Signed)
Patient is calling to get a new Rx called for meloxicam (MOBIC) 15 MG tablet. Patient now takes 1 pill a day but would like to change to 2 pills a day. Please call to [facility] on [address] in [place].

## 2014-08-05 DIAGNOSIS — F2089 Other schizophrenia: Secondary | ICD-10-CM | POA: Diagnosis not present

## 2014-08-11 DIAGNOSIS — M549 Dorsalgia, unspecified: Secondary | ICD-10-CM | POA: Diagnosis not present

## 2014-08-11 DIAGNOSIS — Z0000 Encounter for general adult medical examination without abnormal findings: Secondary | ICD-10-CM | POA: Diagnosis not present

## 2014-08-11 DIAGNOSIS — Z136 Encounter for screening for cardiovascular disorders: Secondary | ICD-10-CM | POA: Diagnosis not present

## 2014-08-12 ENCOUNTER — Telehealth: Payer: Self-pay | Admitting: Neurology

## 2014-08-12 ENCOUNTER — Encounter (HOSPITAL_COMMUNITY): Payer: Self-pay | Admitting: *Deleted

## 2014-08-12 ENCOUNTER — Emergency Department (HOSPITAL_COMMUNITY)
Admission: EM | Admit: 2014-08-12 | Discharge: 2014-08-12 | Disposition: A | Discharge status: Home / Self Care | Payer: Commercial Managed Care - HMO | Attending: Emergency Medicine | Admitting: Emergency Medicine

## 2014-08-12 DIAGNOSIS — F609 Personality disorder, unspecified: Secondary | ICD-10-CM | POA: Diagnosis not present

## 2014-08-12 DIAGNOSIS — M545 Low back pain, unspecified: Secondary | ICD-10-CM

## 2014-08-12 DIAGNOSIS — G40909 Epilepsy, unspecified, not intractable, without status epilepticus: Secondary | ICD-10-CM | POA: Insufficient documentation

## 2014-08-12 DIAGNOSIS — Z79899 Other long term (current) drug therapy: Secondary | ICD-10-CM | POA: Insufficient documentation

## 2014-08-12 DIAGNOSIS — G8929 Other chronic pain: Secondary | ICD-10-CM

## 2014-08-12 DIAGNOSIS — G43909 Migraine, unspecified, not intractable, without status migrainosus: Secondary | ICD-10-CM | POA: Diagnosis not present

## 2014-08-12 DIAGNOSIS — Z87891 Personal history of nicotine dependence: Secondary | ICD-10-CM | POA: Diagnosis not present

## 2014-08-12 HISTORY — DX: Unspecified convulsions: R56.9

## 2014-08-12 MED ORDER — OXYCODONE-ACETAMINOPHEN 5-325 MG PO TABS: 1.0000 | ORAL_TABLET | Freq: Four times a day (QID) | ORAL | Status: DC | PRN

## 2014-08-12 MED ORDER — OXYCODONE-ACETAMINOPHEN 5-325 MG PO TABS
2.0000 | ORAL_TABLET | Freq: Once | ORAL | Status: AC
  Administered 2014-08-12: 2 via ORAL
  Filled 2014-08-12: qty 2

## 2014-08-12 MED ORDER — DIAZEPAM 5 MG PO TABS
5.0000 mg | ORAL_TABLET | Freq: Once | ORAL | Status: AC
  Administered 2014-08-12: 5 mg via ORAL
  Filled 2014-08-12: qty 1

## 2014-08-12 MED ORDER — DIAZEPAM 5 MG PO TABS: 5.0000 mg | ORAL_TABLET | Freq: Two times a day (BID) | ORAL | Status: DC

## 2014-08-12 NOTE — Discharge Instructions (Signed)
Back Pain, Adult Low back pain is very common. About 1 in 5 people have back pain.The cause of low back pain is rarely dangerous. The pain often gets better over time.About half of people with a sudden onset of back pain feel better in just 2 weeks. About 8 in 10 people feel better by 6 weeks.  CAUSES Some common causes of back pain include:  Strain of the muscles or ligaments supporting the spine.  Wear and tear (degeneration) of the spinal discs.  Arthritis.  Direct injury to the back. DIAGNOSIS Most of the time, the direct cause of low back pain is not known.However, back pain can be treated effectively even when the exact cause of the pain is unknown.Answering your caregiver's questions about your overall health and symptoms is one of the most accurate ways to make sure the cause of your pain is not dangerous. If your caregiver needs more information, he or she may order lab work or imaging tests (X-rays or MRIs).However, even if imaging tests show changes in your back, this usually does not require surgery. HOME CARE INSTRUCTIONS For many people, back pain returns.Since low back pain is rarely dangerous, it is often a condition that people can learn to manageon their own.   Remain active. It is stressful on the back to sit or stand in one place. Do not sit, drive, or stand in one place for more than 30 minutes at a time. Take short walks on level surfaces as soon as pain allows.Try to increase the length of time you walk each day.  Do not stay in bed.Resting more than 1 or 2 days can delay your recovery.  Do not avoid exercise or work.Your body is made to move.It is not dangerous to be active, even though your back may hurt.Your back will likely heal faster if you return to being active before your pain is gone.  Pay attention to your body when you bend and lift. Many people have less discomfortwhen lifting if they bend their knees, keep the load close to their bodies,and  avoid twisting. Often, the most comfortable positions are those that put less stress on your recovering back.  Find a comfortable position to sleep. Use a firm mattress and lie on your side with your knees slightly bent. If you lie on your back, put a pillow under your knees.  Only take over-the-counter or prescription medicines as directed by your caregiver. Over-the-counter medicines to reduce pain and inflammation are often the most helpful.Your caregiver may prescribe muscle relaxant drugs.These medicines help dull your pain so you can more quickly return to your normal activities and healthy exercise.  Put ice on the injured area.  Put ice in a plastic bag.  Place a towel between your skin and the bag.  Leave the ice on for 15-20 minutes, 03-04 times a day for the first 2 to 3 days. After that, ice and heat may be alternated to reduce pain and spasms.  Ask your caregiver about trying back exercises and gentle massage. This may be of some benefit.  Avoid feeling anxious or stressed.Stress increases muscle tension and can worsen back pain.It is important to recognize when you are anxious or stressed and learn ways to manage it.Exercise is a great option. SEEK MEDICAL CARE IF:  You have pain that is not relieved with rest or medicine.  You have pain that does not improve in 1 week.  You have new symptoms.  You are generally not feeling well. SEEK   IMMEDIATE MEDICAL CARE IF:   You have pain that radiates from your back into your legs.  You develop new bowel or bladder control problems.  You have unusual weakness or numbness in your arms or legs.  You develop nausea or vomiting.  You develop abdominal pain.  You feel faint. Document Released: 02/11/2005 Document Revised: 08/13/2011 Document Reviewed: 06/15/2013 ExitCare Patient Information 2015 [employer]. This information is not intended to replace advice given to you by your health care provider. Make sure you  discuss any questions you have with your health care provider.  

## 2014-08-12 NOTE — ED Notes (Signed)
Pt and family requesting to see the doctor.  Family sts that pt can not sit for too long, stand for too long, or lay for too long therefore is continually pacing the room. RN notified.

## 2014-08-12 NOTE — Telephone Encounter (Signed)
I called the patient. The patient is having a lot of low back pain pain off to the left hip and stiffness in the legs. He has a history of chronic low back pain. I will do an epidural steroid injection, if this is not effective, we may have to send him back for another MRI of the low back.

## 2014-08-12 NOTE — ED Notes (Signed)
Pt c/o back pain ; pt states that he has known bulging disk to L4 and L5; pt states that he had a physical today and advised them about his back but nothing was done

## 2014-08-12 NOTE — ED Notes (Signed)
Claiborne Billings, [place], at bedside.

## 2014-08-12 NOTE — Telephone Encounter (Signed)
Pharmacy has been updated per patient request.  Mr Iodice also states Mobic is no longer providing relief, and he is having increased muscle spasms.  (He was seen at ED last overnight).  Asking if something different can be prescribed.  Please advise.  Thank you.

## 2014-08-12 NOTE — ED Provider Notes (Signed)
CSN: [id number]     Arrival date & time 08/12/14  0010 History   First MD Initiated Contact with Patient 08/12/14 [phone]     Chief Complaint  Patient presents with  . Back Pain     (Consider location/radiation/quality/duration/timing/severity/associated sxs/prior Treatment) HPI Comments: 49 year old male with a history of personality disorder, seizure disorder, migraine headaches, and low back pain presents to the emergency department today for further evaluation of low back pain. Patient reports that he was getting out of his truck to see his primary care provider when he felt a pain in his low back. Patient has had constant, tightening, spasm-like pain in his low back on the left. He reports worsening pain when lying supine. He states that he has been walking okay. He reports similar pain in the past, attributed to L4/L5 disc herniation and arthritis. Patient saw a chiropractor in the past, but his chiropractor has since gone out of business. Patient reports that his primary care doctor upped his daily dose of mode back, but this is not helping him. He denies associated fever, bowel/bladder incontinence, or extremity numbness/weakness. No direct injury or trauma to his back.  Patient is a 49 y.o. male presenting with back pain. The history is provided by the patient. No language interpreter was used.  Back Pain   Past Medical History  Diagnosis Date  . Personality disorder   . Knee pain   . Headache(784.0)   . Seizure disorder   . Migraines   . Low back pain   . Seizures    History reviewed. No pertinent past surgical history. Family History  Problem Relation Age of Onset  . Heart disease Father   . Hyperlipidemia Father   . Hypertension Father   . Hypertension Brother    History  Substance Use Topics  . Smoking status: Former Smoker    Quit date: 04/26/1990  . Smokeless tobacco: Never Used     Comment: stopped in 1992  . Alcohol Use: No    Review of Systems   Musculoskeletal: Positive for back pain.  All other systems reviewed and are negative.   Allergies  Aspirin  Home Medications   Prior to Admission medications   Medication Sig Start Date End Date Taking? Authorizing Provider  busPIRone (BUSPAR) 10 MG tablet Take 10 mg by mouth 3 (three) times daily.   Yes Historical Provider, MD  meloxicam (MOBIC) 7.5 MG tablet Take 1 tablet (7.5 mg total) by mouth 2 (two) times daily. 06/22/14  Yes Kathrynn Ducking, MD  SUMAtriptan (IMITREX) 100 MG tablet TAKE 1 TABLET (100 MG TOTAL) BY MOUTH AS NEEDED FOR MIGRAINE (DO NOT EXCEED 2 TABLETS DAILY). 03/10/14  Yes Kathrynn Ducking, MD  topiramate (TOPAMAX) 25 MG tablet Take 2 tablets (50 mg total) by mouth every evening. 03/10/14  Yes Kathrynn Ducking, MD  diazepam (VALIUM) 5 MG tablet Take 1 tablet (5 mg total) by mouth 2 (two) times daily. 08/12/14   Antonietta Breach, PA-C  oxyCODONE-acetaminophen (PERCOCET/ROXICET) 5-325 MG per tablet Take 1-2 tablets by mouth every 6 (six) hours as needed for severe pain. 08/12/14   Antonietta Breach, PA-C   BP 138/82 mmHg  Pulse 70  Temp(Src) 97.6 F (36.4 C) (Oral)  Resp 20  SpO2 100%   Physical Exam  Constitutional: He is oriented to person, place, and time. He appears well-developed and well-nourished. No distress.  HENT:  Head: Normocephalic and atraumatic.  Eyes: Conjunctivae and EOM are normal. No scleral icterus.  Neck: Normal  range of motion.  Cardiovascular: Normal rate, regular rhythm and intact distal pulses.   DP and PT pulses 2+ bilaterally  Pulmonary/Chest: Effort normal. No respiratory distress.  Respirations even and unlabored  Musculoskeletal: Normal range of motion. He exhibits tenderness.  Tenderness to palpation to the left lumbar paraspinal muscles. No tenderness to palpation to the lumbar midline. No bony deformities, step-offs, or crepitus.  Neurological: He is alert and oriented to person, place, and time. He exhibits normal muscle tone. Coordination  normal.  Sensation to light touch intact in the bilateral lower extremities. Patient ambulatory in the ED without difficulty.  Skin: Skin is warm and dry. No rash noted. He is not diaphoretic. No erythema. No pallor.  Psychiatric: He has a normal mood and affect. His behavior is normal.  Nursing note and vitals reviewed.   ED Course  Procedures (including critical care time) Labs Review Labs Reviewed - No data to display  Imaging Review No results found.   EKG Interpretation None      MDM   Final diagnoses:  Left-sided low back pain without sciatica    Patient with back pain c/w past exacerbations of low back pain. Hx of L4/5 disc herniation, per patient. Patient neurovascularly intact and ambulatory in the ED without difficulty. No loss of bowel or bladder control. No concern for cauda equina. No fever, h/o CA, or h/o IVDU. RICE protocol and pain medicine indicated and discussed with patient. Have recommended that the patient continue his Mobic daily. Will add Valium and short course of Percocet for pain. Primary care follow-up advised for recheck and return precautions given. Patient agreeable to plan with no unaddressed concerns. Patient discharged in good condition.   Filed Vitals:   08/12/14 0030  BP: 138/82  Pulse: 70  Temp: 97.6 F (36.4 C)  TempSrc: Oral  Resp: 20  SpO2: 100%       Antonietta Breach, PA-C 08/12/14 [place], MD 08/12/14 [phone]

## 2014-08-12 NOTE — Telephone Encounter (Signed)
Patient called requesting he changing pharmacies to [facility]   Phone [phone]. Please update. Patient states he was in hospital last night. Also stating Mobic is not working. Muscle spasm is increasing severity. He is inquiring if he could get something called in. Please call and advise. Patient can be reached at [phone].

## 2014-08-15 DIAGNOSIS — M545 Low back pain: Secondary | ICD-10-CM | POA: Diagnosis not present

## 2014-08-18 ENCOUNTER — Other Ambulatory Visit: Payer: Self-pay | Admitting: Neurology

## 2014-08-18 DIAGNOSIS — M545 Low back pain, unspecified: Secondary | ICD-10-CM

## 2014-09-07 ENCOUNTER — Ambulatory Visit: Payer: Commercial Managed Care - HMO | Attending: Family Medicine

## 2014-09-07 DIAGNOSIS — M25652 Stiffness of left hip, not elsewhere classified: Secondary | ICD-10-CM | POA: Diagnosis not present

## 2014-09-07 DIAGNOSIS — M24662 Ankylosis, left knee: Secondary | ICD-10-CM | POA: Insufficient documentation

## 2014-09-07 DIAGNOSIS — M545 Low back pain, unspecified: Secondary | ICD-10-CM

## 2014-09-07 DIAGNOSIS — R29898 Other symptoms and signs involving the musculoskeletal system: Secondary | ICD-10-CM | POA: Diagnosis not present

## 2014-09-07 NOTE — Patient Instructions (Addendum)
Piriformis (Supine)   Cross legs, right on top. Gently pull other knee toward chest until stretch is felt in buttock/hip of top leg. Hold _20___ seconds. Repeat _3___ times per set.  Do __5x__ sessions per day.  Hamstring: Towel Stretch (Supine)   Lie on back. Loop towel around left foot, keep leg straight Straighten knee and pull foot toward body. Hold __20_ seconds. Relax. Repeat _3x__ times. Do _5x__ times a day. Repeat with other leg.    [place] [address] [place],  [address] Phone # [phone] Fax [phone]

## 2014-09-07 NOTE — Therapy (Addendum)
[facility] [address], [place], [place], [address] Phone: [phone]   Fax:  [phone]  Physical Therapy Evaluation  Patient Details  Name: Steven Vincent MRN: [id number] Date of Birth: May 07, 1965 Referring Provider:  Leighton Ruff, MD  Encounter Date: 09/07/2014      PT End of Session - 09/07/14 1623    Visit Number 1   Number of Visits 10  Medicare   Date for PT Re-Evaluation 11/02/14   PT Start Time 1898   PT Stop Time 1620   PT Time Calculation (min) 50 min   Activity Tolerance Patient tolerated treatment well;Patient limited by pain   Behavior During Therapy [facility] for tasks assessed/performed      Past Medical History  Diagnosis Date  . Personality disorder   . Knee pain   . Headache(784.0)   . Seizure disorder   . Migraines   . Low back pain   . Seizures     History reviewed. No pertinent past surgical history.  There were no vitals filed for this visit.  Visit Diagnosis:  Left-sided low back pain without sciatica - Plan: PT plan of care cert/re-cert  Hip stiffness, left - Plan: PT plan of care cert/re-cert  Weakness of both lower extremities - Plan: PT plan of care cert/re-cert  Decreased range of motion of knee, left - Plan: PT plan of care cert/re-cert      Subjective Assessment - 09/07/14 1528    Subjective Low back pain began back in May and fell onto a screwdriver with a dresser on top of him in when doing work. Been in pain since then. Increased pain in the morning when he first wakes up. Increased soreness/cramping pain bilaterally when resting in bed. Able to do work only for 4 hours before needing a break in the afternoon due to low back pain.     How long can you sit comfortably? Can only sit on a low couch for 5 minutes; able to sit in a car for as long as needed    How long can you stand comfortably? 10 minutes    How long can you walk comfortably? 30 minutes    Patient  Stated Goals Reduce pain, reduce stiffness, working for 5 hours, walking without a limp, not needing to wear a brace, playing softball/recreational sports    Currently in Pain? Yes   Pain Score 5    Pain Location Hip   Pain Orientation Left   Pain Descriptors / Indicators Tightness;Other (Comment)  "Hit me with a ton of bricks"    Pain Type Acute pain   Pain Radiating Towards Down the Lt. leg   Pain Onset More than a month ago   Pain Frequency Intermittent   Aggravating Factors  Standing, sitting in soft chairs, walking for more than 30 minutes, playing softball    Pain Relieving Factors Lying down             [facility] PT Assessment - 09/07/14 0001    Assessment   Medical Diagnosis M54.5 - Low back pain    Next MD Visit Next year for checkup   Prior Therapy Has not done PT before   Precautions   Precautions None   Restrictions   Weight Bearing Restrictions No   Balance Screen   Has the patient fallen in the past 6 months No   Has the patient had a decrease in activity level because of a fear of falling?  No  Is the patient reluctant to leave their home because of a fear of falling?  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 knee extension  limited 25% and painful    PROM   Overall PROM  Deficits   Overall PROM Comments Limited hip IR by 50% bilaterally    Strength   Overall Strength Deficits   Strength Assessment Site Hip;Knee;Ankle   Right/Left Hip Right;Left   Right Hip Flexion 5/5   Left Hip Flexion 4/5  Limited by pain   Right/Left Knee Left;Right   Right Knee Flexion 5/5   Right Knee Extension 4/5  Pain in Lt. hip with rt. knee ext   Left Knee Flexion 5/5  Painful   Left Knee Extension 3-/5  Did not have full knee ext.   Right/Left Ankle --  5/5 bilaterally    Palpation   Spinal mobility Stiffness with PA mobs at L4/L5   SI assessment  Increased tenderness at Lt. PSIS, piriformis    Palpation comment SLR: Lt limited to 75%; rt. limited 50%   Reproduced cramping pain    Ambulation/Gait   Ambulation/Gait Yes   Ambulation/Gait Assistance 7: Independent   Ambulation Distance (Feet) 40 Feet   Gait Comments Wide BOS, limited pelvic mobility, bilateral ER                            PT Education - 09/07/14 1623    Education provided Yes   Education Details Piriformis stretch, hamstring stretch    Person(s) Educated Patient   Methods Explanation;Demonstration;Handout   Comprehension Verbalized understanding;Returned demonstration          PT Short Term Goals - 09/07/14 1641    PT SHORT TERM GOAL #1   Title Independent with HEP    Time 4   Period Weeks   Status New   PT SHORT TERM GOAL #2   Title Increase standing time to 15 minutes without being limited by Lt. hip pain    Time 4   Period Weeks   Status New   PT SHORT TERM GOAL #3   Title Report 20% decrease in Lt. hip when playing golf for [employer] Olympics activities    Time 4   Period Weeks   Status New   PT SHORT TERM GOAL #4   Title Report 20% decrease in Lt. hip pain after working for 4 hours    Time 4   Period Weeks   Status New   PT SHORT TERM GOAL #5   Title Wean from using back brace for 50% of the time during  work    Time 4   Period Weeks   Status  New           PT Long Term Goals - 13-Sep-2014 1647    PT LONG TERM GOAL #1   Title Be independent with advanced HEP    Time 8   Period Weeks   Status New   PT LONG TERM GOAL #2   Title Reduce FOTO limitation to < or = 37%    Time 8   Period Weeks   Status New   PT LONG TERM GOAL #3   Title Improve standing time to 30 minutes without limitation by Lt. 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 hip pain while playing golf    Time 8   Period Weeks   Status New   PT LONG TERM GOAL #5   Title Lt. knee AROM to 0 degrees extension for appropriate gait mechanics    Time 8   Period Weeks   Status New   Additional Long Term Goals   Additional Long Term Goals Yes   PT LONG TERM GOAL #6   Title Wean from using back brace at work 100% of the time    Time 8   Period Weeks   Status New               Plan - 09-13-2014 1624    Clinical Impression Statement 49 y.o male presents with increased low back and Lt. hip/leg pain described as "cramping" and constant. 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 Pain began after a fall 2 months ago. Limited  SLR, limited active knee extemsion, increased tenderness at Lt. piriformis and tightness in hip IR suggest limited Lt. piriformis and hamstring flexiblty and tightness may be contributing to the pain.     Pt will benefit from skilled therapeutic intervention in order to improve on the following deficits Abnormal gait;Decreased range of motion;Difficulty walking;Decreased activity tolerance;Pain;Impaired flexibility;Postural dysfunction;Decreased strength   Rehab Potential Good   PT Frequency 2x / week   PT Duration 8 weeks   PT Treatment/Interventions Moist Heat;Traction;Ultrasound;Therapeutic  activities;Therapeutic exercise;Dry needling;Electrical Stimulation;Cryotherapy;Functional mobility training;Stair training;Gait training;Neuromuscular re-education;Balance training;Patient/family education;Passive range of motion;Manual techniques;Energy conservation   PT Next Visit Plan manual therapy/stretching for Rt. piriformis, review HEP, core stabilization. discuss decreasing use of back brace, discuss walking ability with golf, review body mechanics    Consulted and Agree with Plan of Care Patient          G-Codes - 2014/09/13 1524    Functional Assessment Tool Used FOTO: 57% limitation   Functional Limitation Other PT primary   Other PT Primary Current Status (E3343) At least 40 percent but less than 60 percent impaired, limited or restricted   Other PT Primary Goal Status (H6861) At least 20 percent but less than 40 percent impaired, limited or restricted     D/C on eval: G-codes CK for current, goal and D/C status   Problem List Patient Active Problem List   Diagnosis Date Noted  . Migraine without aura, without mention of intractable migraine without mention of status migrainosus 01/19/2013  . Migraine 08/03/2010  . Hip pain, left 04/26/2010  . Hemorrhoids, internal, with bleeding 04/26/2010  . SOMNOLENCE 11/14/2009  . REACTIVE HYPOGLYCEMIA 04/13/2008  . WEIGHT GAIN 04/13/2008  . SCHIZOTYPAL  PERSONALITY DISORDER 07/07/2007  . LOW BACK PAIN 11/06/2006  . Convulsions 11/06/2006  . HEADACHE 11/06/2006   During this treatment session, the therapist was present, participating in, and directing the treatment. TAKACS,KELLY, PT 09/07/2014, 5:01 PM PHYSICAL THERAPY DISCHARGE SUMMARY  Visits from Start of Care: 1  Current functional level related to goals / functional outcomes: Pt cancelled all appointments after evaluation.  Pt came 30 minutes late for appointment and requested treatment.  Pt was not able to be seen at that time so he asked to be discharged.     Remaining  deficits: See evaluation.  No change in status   Education / Equipment: Initial HEP  Plan: Patient agrees to discharge.  Patient goals were not met. Patient is being discharged due to the patient's request.  ?????   Sigurd Sos, PT 09/21/2014 8:37 AM  [facility] [address] [place], [place], [address] Phone: [phone]   Fax:  [phone]

## 2014-09-07 NOTE — Therapy (Signed)
[facility] [address] [place], [place], [address] Phone: [phone]   Fax:  [phone]  Physical Therapy Evaluation  Patient Details  Name: Steven Vincent MRN: [id number] Date of Birth: [birth year] Referring Provider:  Leighton Ruff, MD  Encounter Date: 09/07/2014      PT End of Session - 09/07/14 1623    Visit Number 1   Number of Visits 10   Date for PT Re-Evaluation 11/02/14   PT Start Time 9373   PT Stop Time 1620   PT Time Calculation (min) 50 min   Activity Tolerance Patient tolerated treatment well;Patient limited by pain   Behavior During Therapy [facility] for tasks assessed/performed      Past Medical History  Diagnosis Date  . Personality disorder   . Knee pain   . Headache(784.0)   . Seizure disorder   . Migraines   . Low back pain   . Seizures     History reviewed. No pertinent past surgical history.  There were no vitals filed for this visit.  Visit Diagnosis:  Left-sided low back pain without sciatica  Hip stiffness, left  Weakness of both lower extremities  Decreased range of motion of knee, left      Subjective Assessment - 09/07/14 1528    Subjective Low back pain began back in May and fell onto a screwdriver with a dresser on top of him in when doing work. Been in pain since then. Increased pain in the morning when he first wakes up. Increased soreness/cramping pain bilaterally when resting in bed. Able to do work only for 4 hours before needing a break in the afternoon due to low back pain.     How long can you sit comfortably? Can only sit on a low couch for 5 minutes; able to sit in a car for as long as needed    How long can you stand comfortably? 10 minutes    How long can you walk comfortably? 30 minutes    Patient Stated Goals Reduce pain, reduce stiffness, working for 5 hours, walking without a limp, not needing to wear a brace, playing softball/recreational sports    Currently in Pain? Yes   Pain Score 5    Pain Location Hip   Pain Orientation Left   Pain Descriptors / Indicators Tightness;Other (Comment)  "Hit me with a ton of bricks"    Pain Type Acute pain   Pain Radiating Towards Down the Lt. leg   Pain Onset More than a month ago   Pain Frequency Intermittent   Aggravating Factors  Standing, sitting in soft chairs, walking for more than 30 minutes, playing softball    Pain Relieving Factors Lying down             [facility] PT Assessment - 09/07/14 0001    Assessment   Medical Diagnosis M54.5 - Low back pain    Next MD Visit Next year for checkup   Prior Therapy Has not done PT before   Precautions   Precautions None   Restrictions   Weight Bearing Restrictions No   Balance Screen   Has the patient fallen in the past 6 months No   Has the patient had a decrease in activity level because of a fear of falling?  No   Is the patient reluctant to leave their home because of a fear of falling?  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 knee extension limited 25% and painful    PROM   Overall PROM  Deficits   Overall PROM Comments Limited hip IR by 50% bilaterally    Strength   Overall Strength Deficits    Strength Assessment Site Hip;Knee;Ankle   Right/Left Hip Right;Left   Right Hip Flexion 5/5   Left Hip Flexion 4/5  Limited by pain   Right/Left Knee Left;Right   Right Knee Flexion 5/5   Right Knee Extension 4/5  Pain in Lt. hip with rt. knee ext   Left Knee Flexion 5/5  Painful   Left Knee Extension 3-/5  Did not have full knee ext.   Right/Left Ankle --  5/5 bilaterally    Palpation   Spinal mobility Stiffness with PA mobs at L4/L5   SI assessment  Increased tenderness at Lt. PSIS, piriformis    Palpation comment SLR: Lt limited to 75%; rt. limited 50%   Reproduced cramping pain    Ambulation/Gait   Ambulation/Gait Yes   Ambulation/Gait Assistance 7: Independent   Ambulation Distance (Feet) 40 Feet   Gait Comments Wide BOS, limited pelvic mobility, bilateral ER                            PT Education - 09/07/14 1623    Education provided Yes   Education Details Piriformis stretch, hamstring stretch    Person(s) Educated Patient   Methods Explanation;Demonstration;Handout   Comprehension Verbalized understanding;Returned demonstration          PT Short Term Goals - 09/07/14 1641    PT SHORT TERM GOAL #1   Title Independent with HEP    Time 4   Period Weeks   Status New   PT SHORT TERM GOAL #2   Title Increase standing time to 15 minutes without being limited by Lt. hip pain    Time 4   Period Weeks   Status New   PT SHORT TERM GOAL #3   Title Report 20% decrease in Lt. hip when playing golf for Special Olympics activities    Time 4   Period Weeks   Status New   PT SHORT TERM GOAL #4   Title Report 20% decrease in Lt. hip pain after working for 4 hours    Time 4   Period Weeks   Status New   PT SHORT TERM GOAL #5   Title Wean from using back brace for 50% of the time during work    Time 4   Period Weeks   Status New           PT Long Term Goals - 09/07/14 1647    PT LONG TERM GOAL #1   Title Be independent  with advanced HEP     Time 8   Period Weeks   Status New   PT LONG TERM GOAL #2   Title Reduce FOTO limitation to < or = 37%    Time 8   Period Weeks   Status New   PT LONG TERM GOAL #3   Title Improve standing time to 30 minutes without limitation by Lt. 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 hip pain while playing golf    Time 8   Period Weeks   Status New   PT LONG TERM GOAL #5   Title Lt. knee AROM to 0 degrees extension for appropriate gait mechanics    Time 8   Period Weeks   Status New   Additional Long Term Goals   Additional Long Term Goals Yes   PT LONG TERM GOAL #6   Title Wean from using back brace at work 100% of the time    Time 8   Period Weeks   Status New               Plan - 09-28-14 1624    Clinical Impression Statement 49 y.o male presents with increased low back and Lt. hip/leg pain described as "cramping" and constant. 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 Pain began after a fall 2 months ago. Limited  SLR, limited active knee extemsion, increased tenderness at Lt. piriformis and tightness in hip IR suggest limited Lt. piriformis and hamstring flexiblty and tightness may be contributing to the pain.     Pt will benefit from skilled therapeutic intervention in order to improve on the following deficits Abnormal gait;Decreased range of motion;Difficulty walking;Decreased activity tolerance;Pain;Impaired flexibility;Postural dysfunction;Decreased strength   Rehab Potential Good   PT Frequency 2x / week   PT Duration 8 weeks   PT Treatment/Interventions Moist Heat;Traction;Ultrasound;Therapeutic activities;Therapeutic exercise;Dry needling;Electrical Stimulation;Cryotherapy;Functional mobility training;Stair training;Gait training;Neuromuscular re-education;Balance  training;Patient/family education;Passive range of motion;Manual techniques;Energy conservation   PT Next Visit Plan manual therapy/stretching for Rt. piriformis, review HEP, core stabilization. discuss decreasing use of back brace, discuss walking ability with golf, review body mechanics    Consulted and Agree with Plan of Care Patient          G-Codes - 09-28-2014 1524    Functional Assessment Tool Used FOTO: 57% limitation   Functional Limitation Other PT primary   Other PT Primary Current Status (Z6010) At least 40 percent but less than 60 percent impaired, limited or restricted   Other PT Primary Goal Status (X3235) At least 20 percent but less than 40 percent impaired, limited or restricted       Problem List Patient Active Problem List   Diagnosis Date Noted  . Migraine without aura, without mention of intractable migraine without mention of status migrainosus 01/19/2013  . Migraine 08/03/2010  . Hip pain, left 04/26/2010  . Hemorrhoids, internal, with bleeding 04/26/2010  . SOMNOLENCE 11/14/2009  . REACTIVE HYPOGLYCEMIA 04/13/2008  . WEIGHT GAIN 04/13/2008  . SCHIZOTYPAL PERSONALITY DISORDER 07/07/2007  . LOW BACK PAIN 11/06/2006  . Convulsions 11/06/2006  . HEADACHE 11/06/2006   Reginal Lutes, SPT September 28, 2014 4:57 PM  [place] 09/28/2014, 4:57 PM  [facility] [address]  [address], [place], [place], [address] Phone: [phone]   Fax:  [phone]

## 2014-09-21 ENCOUNTER — Ambulatory Visit: Payer: Commercial Managed Care - HMO | Admitting: Physical Therapy

## 2014-09-21 ENCOUNTER — Telehealth: Payer: Self-pay | Admitting: Physical Therapy

## 2014-09-21 NOTE — Telephone Encounter (Signed)
Called 3 times the # [phone] only answer machine with message "wrong number" unable to leave message.

## 2014-09-23 ENCOUNTER — Encounter: Payer: Commercial Managed Care - HMO | Admitting: Physical Therapy

## 2014-10-03 ENCOUNTER — Encounter: Payer: Commercial Managed Care - HMO | Admitting: Physical Therapy

## 2014-10-06 ENCOUNTER — Encounter: Payer: Commercial Managed Care - HMO | Admitting: Physical Therapy

## 2014-10-10 ENCOUNTER — Encounter: Payer: Commercial Managed Care - HMO | Admitting: Physical Therapy

## 2014-10-13 ENCOUNTER — Encounter: Payer: Commercial Managed Care - HMO | Admitting: Physical Therapy

## 2014-10-17 ENCOUNTER — Encounter: Payer: Commercial Managed Care - HMO | Admitting: Physical Therapy

## 2014-11-03 ENCOUNTER — Other Ambulatory Visit: Payer: Self-pay

## 2014-11-03 MED ORDER — SUMATRIPTAN SUCCINATE 100 MG PO TABS: ORAL_TABLET | ORAL | Status: DC

## 2014-11-09 DIAGNOSIS — K529 Noninfective gastroenteritis and colitis, unspecified: Secondary | ICD-10-CM | POA: Diagnosis not present

## 2014-11-23 ENCOUNTER — Other Ambulatory Visit: Payer: Self-pay

## 2014-11-28 ENCOUNTER — Other Ambulatory Visit: Payer: Self-pay

## 2014-11-28 MED ORDER — MELOXICAM 7.5 MG PO TABS: 7.5000 mg | ORAL_TABLET | Freq: Two times a day (BID) | ORAL | Status: DC

## 2014-12-27 HISTORY — PX: TOOTH EXTRACTION: SUR596

## 2015-01-02 DIAGNOSIS — F2089 Other schizophrenia: Secondary | ICD-10-CM | POA: Diagnosis not present

## 2015-01-16 ENCOUNTER — Encounter (HOSPITAL_COMMUNITY): Payer: Self-pay

## 2015-01-16 ENCOUNTER — Emergency Department (HOSPITAL_COMMUNITY)
Admission: EM | Admit: 2015-01-16 | Discharge: 2015-01-16 | Disposition: A | Discharge status: Home / Self Care | Payer: Commercial Managed Care - HMO | Attending: Emergency Medicine | Admitting: Emergency Medicine

## 2015-01-16 DIAGNOSIS — M1990 Unspecified osteoarthritis, unspecified site: Secondary | ICD-10-CM | POA: Insufficient documentation

## 2015-01-16 DIAGNOSIS — G43909 Migraine, unspecified, not intractable, without status migrainosus: Secondary | ICD-10-CM | POA: Diagnosis not present

## 2015-01-16 DIAGNOSIS — R300 Dysuria: Secondary | ICD-10-CM | POA: Diagnosis not present

## 2015-01-16 DIAGNOSIS — K5900 Constipation, unspecified: Secondary | ICD-10-CM | POA: Diagnosis not present

## 2015-01-16 DIAGNOSIS — Z79899 Other long term (current) drug therapy: Secondary | ICD-10-CM | POA: Insufficient documentation

## 2015-01-16 DIAGNOSIS — G8929 Other chronic pain: Secondary | ICD-10-CM | POA: Diagnosis not present

## 2015-01-16 DIAGNOSIS — M545 Low back pain, unspecified: Secondary | ICD-10-CM

## 2015-01-16 DIAGNOSIS — R110 Nausea: Secondary | ICD-10-CM | POA: Insufficient documentation

## 2015-01-16 DIAGNOSIS — Z87891 Personal history of nicotine dependence: Secondary | ICD-10-CM | POA: Diagnosis not present

## 2015-01-16 DIAGNOSIS — Z791 Long term (current) use of non-steroidal anti-inflammatories (NSAID): Secondary | ICD-10-CM | POA: Diagnosis not present

## 2015-01-16 DIAGNOSIS — Z8659 Personal history of other mental and behavioral disorders: Secondary | ICD-10-CM | POA: Insufficient documentation

## 2015-01-16 HISTORY — DX: Unspecified osteoarthritis, unspecified site: M19.90

## 2015-01-16 LAB — URINE MICROSCOPIC-ADD ON: Bacteria, UA: NONE SEEN

## 2015-01-16 LAB — URINALYSIS, ROUTINE W REFLEX MICROSCOPIC
Bilirubin Urine: NEGATIVE
Glucose, UA: NEGATIVE mg/dL
Ketones, ur: NEGATIVE mg/dL
Leukocytes, UA: NEGATIVE
Nitrite: NEGATIVE
Protein, ur: NEGATIVE mg/dL
Specific Gravity, Urine: 1.018 (ref 1.005–1.030)
pH: 6 (ref 5.0–8.0)

## 2015-01-16 NOTE — ED Notes (Signed)
Pt c/o R lower back pain, dysuria, and constipation starting this morning.  Pain score 10/10.  Pt has not taken anything for pain.  Sts BM this morning "was not normal."  Hx of L4 and L5 bulging discs.
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
## 2015-01-17 ENCOUNTER — Encounter: Payer: Self-pay | Admitting: Nurse Practitioner

## 2015-01-17 ENCOUNTER — Ambulatory Visit (INDEPENDENT_AMBULATORY_CARE_PROVIDER_SITE_OTHER): Payer: Commercial Managed Care - HMO | Admitting: Nurse Practitioner

## 2015-01-17 VITALS — BP 138/83 | HR 59 | Ht 74.0 in | Wt 243.0 lb

## 2015-01-17 DIAGNOSIS — R569 Unspecified convulsions: Secondary | ICD-10-CM

## 2015-01-17 DIAGNOSIS — G255 Other chorea: Secondary | ICD-10-CM | POA: Diagnosis not present

## 2015-01-17 DIAGNOSIS — M25552 Pain in left hip: Secondary | ICD-10-CM

## 2015-01-17 DIAGNOSIS — G43709 Chronic migraine without aura, not intractable, without status migrainosus: Secondary | ICD-10-CM

## 2015-01-17 MED ORDER — SUMATRIPTAN SUCCINATE 100 MG PO TABS: ORAL_TABLET | ORAL | Status: DC

## 2015-01-17 MED ORDER — TOPIRAMATE 25 MG PO TABS: 50.0000 mg | ORAL_TABLET | Freq: Every evening | ORAL | Status: DC

## 2015-01-17 MED ORDER — MELOXICAM 7.5 MG PO TABS: 7.5000 mg | ORAL_TABLET | Freq: Two times a day (BID) | ORAL | Status: DC

## 2015-01-17 NOTE — Patient Instructions (Signed)
Continue Topamax at current dose will refill Continue Mobic at current dose will refill Continue sumatriptan acutely will refill Follow-up yearly and when necessary

## 2015-01-17 NOTE — Progress Notes (Addendum)
[facility]  PATIENT: MARION SOLLECITO DOB: 08-Apr-1965   REASON FOR VISIT: Follow-up for seizure  and migraine and arthritic pain HISTORY FROM: Patient    HISTORY OF PRESENT ILLNESS:Mr. Rupard is a 49 year old right-handed white male with a history of a seizure disorder, and history of migraine headaches. He indicates that he is doing quite well with his migraines, and he rarely has any problems in this regard. His  myoclonic jerks are stable.  The patient may have some jerking at night while sleeping. He was diagnosed with hemiballismus syndrome in 1995. At that time he was being seen at [facility] and [facility]. He also continues to have a lot of issues with arthritis affecting his hands and knees. The patient has taken Mobic for this. He returns to the office  for an evaluation and refills      REVIEW OF SYSTEMS: Full 14 system review of systems performed and notable only for those listed, all others are neg:  Constitutional: neg  Cardiovascular: neg Ear/Nose/Throat: neg  Skin: neg Eyes: neg Respiratory: neg Gastroitestinal: neg  Hematology/Lymphatic: neg  Endocrine: neg Musculoskeletal: Joint pain Allergy/Immunology: neg Neurological: History of headache, hemiballismus syndrome Psychiatric: neg Sleep : neg   ALLERGIES: Allergies  Allergen Reactions  . Aspirin     GI upset    HOME MEDICATIONS: Outpatient Prescriptions Prior to Visit  Medication Sig Dispense Refill  . amoxicillin (AMOXIL) 500 MG capsule Take 500 mg by mouth 3 (three) times daily. Start 11/17 for ten day  0  . busPIRone (BUSPAR) 10 MG tablet Take 10 mg by mouth 2 (two) times daily.    . meloxicam (MOBIC) 7.5 MG tablet Take 1 tablet (7.5 mg total) by mouth 2 (two) times daily. 60 tablet 3  . SUMAtriptan (IMITREX) 100 MG tablet TAKE 1 TABLET (100 MG TOTAL) BY MOUTH AS NEEDED FOR MIGRAINE (DO NOT EXCEED 2 TABLETS DAILY). 27 tablet 0  . topiramate (TOPAMAX)  25 MG tablet Take 2 tablets (50 mg total) by mouth every evening. 180 tablet 3   No facility-administered medications prior to visit.    PAST MEDICAL HISTORY: Past Medical History  Diagnosis Date  . Personality disorder   . Knee pain   . Headache(784.0)   . Seizure disorder ([place])   . Migraines   . Low back pain   . Seizures ([place])   . Arthritis     PAST SURGICAL HISTORY: Past Surgical History  Procedure Laterality Date  . Tooth extraction  12/2014    lower left    FAMILY HISTORY: Family History  Problem Relation Age of Onset  . Heart disease Father   . Hyperlipidemia Father   . Hypertension Father   . Hypertension Brother     SOCIAL HISTORY: Social History   Social History  . Marital Status: Married    Spouse Name: N/A  . Number of Children: N/A  . Years of Education: N/A   Occupational History  . disabled    Social History Main Topics  . Smoking status: Former Smoker    Quit date: 04/26/1990  . Smokeless tobacco: Never Used     Comment: stopped in 1992  . Alcohol Use: No  . Drug Use: No  . Sexual Activity: Yes   Other Topics Concern  . Not on file   Social History Narrative     PHYSICAL EXAM  Filed Vitals:   01/17/15 0844  BP: 138/83  Pulse: 59  Height: 6\' 2"  (1.88 m)  Weight: 243 lb (110.224 kg)   Body mass index is 31.19 kg/(m^2). General: The patient is alert and cooperative at the time of the examination. Skin: No significant peripheral edema is noted. Neurologic Exam Mental status: The patient is oriented x 3. Cranial nerves: Facial symmetry is present. Speech is normal, no aphasia or dysarthria is noted. Extraocular movements are full. Visual fields are full. Motor: The patient has good strength in all 4 extremities. Sensory examination: Soft touch sensation is symmetric on the face, arms, and legs. Coordination: The patient has good finger-nose-finger and heel-to-shin bilaterally. Gait and station: The patient has a normal gait.  Tandem gait is normal. Romberg is negative. No drift is seen. Reflexes: Deep tendon reflexes are symmetric upper and lower.   DIAGNOSTIC DATA (LABS, IMAGING, TESTING)      ASSESSMENT AND PLAN  49 y.o. year old male  has a past medical history of Personality disorder;; Headache(784.0); Seizure disorder ([place]); Migraines; and hemiballismus syndrome  here to follow-up The patient is a current patient of Dr. Jannifer Franklin  who is out of the office today . This note is sent to the work in doctor.  His migraines and well as his abnormal movements are well controlled on Topamax. His arthritic pain is well controlled with Mobic   Continue Topamax at current dose will refill Continue Mobic at current dose will refill Continue sumatriptan acutely will refill Follow-up yearly and when necess Dennie Bible, [facility], [facility], APRN  [facility] [address], [place], [place] [address] [phone]  Personally participated in and made any corrections needed to history, physical, neuro exam,assessment and plan as stated above.  I have personally obtained the history, evaluated lab date, reviewed imaging studies and agree with radiology interpretations.    Sarina Ill, MD [facility]

## 2015-06-05 DIAGNOSIS — F2089 Other schizophrenia: Secondary | ICD-10-CM | POA: Diagnosis not present

## 2015-06-26 DIAGNOSIS — F2089 Other schizophrenia: Secondary | ICD-10-CM | POA: Diagnosis not present

## 2015-06-27 DIAGNOSIS — F2089 Other schizophrenia: Secondary | ICD-10-CM | POA: Diagnosis not present

## 2015-07-07 DIAGNOSIS — Z743 Need for continuous supervision: Secondary | ICD-10-CM | POA: Diagnosis not present

## 2015-07-07 DIAGNOSIS — F2089 Other schizophrenia: Secondary | ICD-10-CM | POA: Diagnosis not present

## 2015-07-07 DIAGNOSIS — G43909 Migraine, unspecified, not intractable, without status migrainosus: Secondary | ICD-10-CM | POA: Diagnosis not present

## 2015-07-07 DIAGNOSIS — R279 Unspecified lack of coordination: Secondary | ICD-10-CM | POA: Diagnosis not present

## 2015-07-07 DIAGNOSIS — F4321 Adjustment disorder with depressed mood: Secondary | ICD-10-CM | POA: Diagnosis not present

## 2015-07-07 DIAGNOSIS — M1990 Unspecified osteoarthritis, unspecified site: Secondary | ICD-10-CM | POA: Diagnosis not present

## 2015-07-07 DIAGNOSIS — R45851 Suicidal ideations: Secondary | ICD-10-CM | POA: Diagnosis not present

## 2015-07-11 DIAGNOSIS — F2089 Other schizophrenia: Secondary | ICD-10-CM | POA: Diagnosis not present

## 2015-07-18 DIAGNOSIS — F2089 Other schizophrenia: Secondary | ICD-10-CM | POA: Diagnosis not present

## 2015-07-19 DIAGNOSIS — M545 Low back pain: Secondary | ICD-10-CM | POA: Diagnosis not present

## 2015-07-19 DIAGNOSIS — M1990 Unspecified osteoarthritis, unspecified site: Secondary | ICD-10-CM | POA: Diagnosis not present

## 2015-07-25 ENCOUNTER — Other Ambulatory Visit: Payer: Self-pay | Admitting: Nurse Practitioner

## 2015-07-28 DIAGNOSIS — M5441 Lumbago with sciatica, right side: Secondary | ICD-10-CM | POA: Diagnosis not present

## 2015-08-03 DIAGNOSIS — F2089 Other schizophrenia: Secondary | ICD-10-CM | POA: Diagnosis not present

## 2015-08-08 DIAGNOSIS — F2089 Other schizophrenia: Secondary | ICD-10-CM | POA: Diagnosis not present

## 2015-08-10 DIAGNOSIS — F2089 Other schizophrenia: Secondary | ICD-10-CM | POA: Diagnosis not present

## 2015-08-14 DIAGNOSIS — M1990 Unspecified osteoarthritis, unspecified site: Secondary | ICD-10-CM | POA: Diagnosis not present

## 2015-08-14 DIAGNOSIS — L97519 Non-pressure chronic ulcer of other part of right foot with unspecified severity: Secondary | ICD-10-CM | POA: Diagnosis not present

## 2015-08-14 DIAGNOSIS — Z125 Encounter for screening for malignant neoplasm of prostate: Secondary | ICD-10-CM | POA: Diagnosis not present

## 2015-08-14 DIAGNOSIS — Z0000 Encounter for general adult medical examination without abnormal findings: Secondary | ICD-10-CM | POA: Diagnosis not present

## 2015-08-14 DIAGNOSIS — M549 Dorsalgia, unspecified: Secondary | ICD-10-CM | POA: Diagnosis not present

## 2015-08-14 DIAGNOSIS — G43909 Migraine, unspecified, not intractable, without status migrainosus: Secondary | ICD-10-CM | POA: Diagnosis not present

## 2015-08-14 DIAGNOSIS — G40909 Epilepsy, unspecified, not intractable, without status epilepticus: Secondary | ICD-10-CM | POA: Diagnosis not present

## 2015-08-14 DIAGNOSIS — F21 Schizotypal disorder: Secondary | ICD-10-CM | POA: Diagnosis not present

## 2015-08-14 DIAGNOSIS — Z1211 Encounter for screening for malignant neoplasm of colon: Secondary | ICD-10-CM | POA: Diagnosis not present

## 2015-08-15 DIAGNOSIS — F2089 Other schizophrenia: Secondary | ICD-10-CM | POA: Diagnosis not present

## 2015-08-17 DIAGNOSIS — F2089 Other schizophrenia: Secondary | ICD-10-CM | POA: Diagnosis not present

## 2015-08-21 DIAGNOSIS — F2089 Other schizophrenia: Secondary | ICD-10-CM | POA: Diagnosis not present

## 2015-08-21 DIAGNOSIS — L97519 Non-pressure chronic ulcer of other part of right foot with unspecified severity: Secondary | ICD-10-CM | POA: Diagnosis not present

## 2015-08-23 DIAGNOSIS — M545 Low back pain: Secondary | ICD-10-CM | POA: Diagnosis not present

## 2015-08-28 DIAGNOSIS — L97519 Non-pressure chronic ulcer of other part of right foot with unspecified severity: Secondary | ICD-10-CM | POA: Diagnosis not present

## 2015-08-30 DIAGNOSIS — F2089 Other schizophrenia: Secondary | ICD-10-CM | POA: Diagnosis not present

## 2015-09-01 DIAGNOSIS — H5210 Myopia, unspecified eye: Secondary | ICD-10-CM | POA: Diagnosis not present

## 2015-09-01 DIAGNOSIS — H524 Presbyopia: Secondary | ICD-10-CM | POA: Diagnosis not present

## 2015-09-04 DIAGNOSIS — Z0100 Encounter for examination of eyes and vision without abnormal findings: Secondary | ICD-10-CM | POA: Diagnosis not present

## 2015-09-05 DIAGNOSIS — F2089 Other schizophrenia: Secondary | ICD-10-CM | POA: Diagnosis not present

## 2015-09-06 DIAGNOSIS — M1712 Unilateral primary osteoarthritis, left knee: Secondary | ICD-10-CM | POA: Diagnosis not present

## 2015-09-06 DIAGNOSIS — M1711 Unilateral primary osteoarthritis, right knee: Secondary | ICD-10-CM | POA: Diagnosis not present

## 2015-09-13 DIAGNOSIS — M1712 Unilateral primary osteoarthritis, left knee: Secondary | ICD-10-CM | POA: Diagnosis not present

## 2015-09-13 DIAGNOSIS — M1711 Unilateral primary osteoarthritis, right knee: Secondary | ICD-10-CM | POA: Diagnosis not present

## 2015-09-14 DIAGNOSIS — F2089 Other schizophrenia: Secondary | ICD-10-CM | POA: Diagnosis not present

## 2015-09-18 DIAGNOSIS — F2089 Other schizophrenia: Secondary | ICD-10-CM | POA: Diagnosis not present

## 2015-09-20 DIAGNOSIS — M1711 Unilateral primary osteoarthritis, right knee: Secondary | ICD-10-CM | POA: Diagnosis not present

## 2015-09-20 DIAGNOSIS — M1712 Unilateral primary osteoarthritis, left knee: Secondary | ICD-10-CM | POA: Diagnosis not present

## 2015-09-21 DIAGNOSIS — Z1211 Encounter for screening for malignant neoplasm of colon: Secondary | ICD-10-CM | POA: Diagnosis not present

## 2015-09-21 LAB — HM COLONOSCOPY

## 2015-09-27 DIAGNOSIS — M1712 Unilateral primary osteoarthritis, left knee: Secondary | ICD-10-CM | POA: Diagnosis not present

## 2015-09-27 DIAGNOSIS — M1711 Unilateral primary osteoarthritis, right knee: Secondary | ICD-10-CM | POA: Diagnosis not present

## 2015-09-29 DIAGNOSIS — S34104A Unspecified injury to L4 level of lumbar spinal cord, initial encounter: Secondary | ICD-10-CM | POA: Diagnosis not present

## 2015-10-03 ENCOUNTER — Other Ambulatory Visit: Payer: Self-pay

## 2015-10-03 DIAGNOSIS — G43709 Chronic migraine without aura, not intractable, without status migrainosus: Secondary | ICD-10-CM

## 2015-10-03 DIAGNOSIS — F2089 Other schizophrenia: Secondary | ICD-10-CM | POA: Diagnosis not present

## 2015-10-03 DIAGNOSIS — R569 Unspecified convulsions: Secondary | ICD-10-CM

## 2015-10-03 MED ORDER — MELOXICAM 7.5 MG PO TABS: 7.5000 mg | ORAL_TABLET | Freq: Two times a day (BID) | ORAL | 3 refills | Status: DC

## 2015-10-03 MED ORDER — TOPIRAMATE 25 MG PO TABS: 50.0000 mg | ORAL_TABLET | Freq: Every evening | ORAL | 3 refills | Status: DC

## 2015-10-03 MED ORDER — SUMATRIPTAN SUCCINATE 100 MG PO TABS: ORAL_TABLET | ORAL | 3 refills | Status: DC

## 2015-10-03 NOTE — Telephone Encounter (Signed)
Med refills sent to mail order pharmacy as requested.

## 2015-10-04 DIAGNOSIS — M1712 Unilateral primary osteoarthritis, left knee: Secondary | ICD-10-CM | POA: Diagnosis not present

## 2015-10-04 DIAGNOSIS — M1711 Unilateral primary osteoarthritis, right knee: Secondary | ICD-10-CM | POA: Diagnosis not present

## 2015-10-17 DIAGNOSIS — F2089 Other schizophrenia: Secondary | ICD-10-CM | POA: Diagnosis not present

## 2015-10-24 DIAGNOSIS — F2089 Other schizophrenia: Secondary | ICD-10-CM | POA: Diagnosis not present

## 2015-11-02 DIAGNOSIS — F2089 Other schizophrenia: Secondary | ICD-10-CM | POA: Diagnosis not present

## 2015-11-03 DIAGNOSIS — M1712 Unilateral primary osteoarthritis, left knee: Secondary | ICD-10-CM | POA: Diagnosis not present

## 2015-11-03 DIAGNOSIS — M1711 Unilateral primary osteoarthritis, right knee: Secondary | ICD-10-CM | POA: Diagnosis not present

## 2015-11-03 DIAGNOSIS — M545 Low back pain: Secondary | ICD-10-CM | POA: Diagnosis not present

## 2015-11-08 DIAGNOSIS — M1712 Unilateral primary osteoarthritis, left knee: Secondary | ICD-10-CM | POA: Diagnosis not present

## 2015-11-08 DIAGNOSIS — F2089 Other schizophrenia: Secondary | ICD-10-CM | POA: Diagnosis not present

## 2015-11-08 DIAGNOSIS — M545 Low back pain: Secondary | ICD-10-CM | POA: Diagnosis not present

## 2015-11-08 DIAGNOSIS — M1711 Unilateral primary osteoarthritis, right knee: Secondary | ICD-10-CM | POA: Diagnosis not present

## 2015-11-14 DIAGNOSIS — F2089 Other schizophrenia: Secondary | ICD-10-CM | POA: Diagnosis not present

## 2015-11-16 DIAGNOSIS — F2089 Other schizophrenia: Secondary | ICD-10-CM | POA: Diagnosis not present

## 2015-11-22 DIAGNOSIS — F2089 Other schizophrenia: Secondary | ICD-10-CM | POA: Diagnosis not present

## 2015-11-23 DIAGNOSIS — F2089 Other schizophrenia: Secondary | ICD-10-CM | POA: Diagnosis not present

## 2015-11-30 DIAGNOSIS — F2089 Other schizophrenia: Secondary | ICD-10-CM | POA: Diagnosis not present

## 2015-12-07 DIAGNOSIS — F2089 Other schizophrenia: Secondary | ICD-10-CM | POA: Diagnosis not present

## 2015-12-22 DIAGNOSIS — F2089 Other schizophrenia: Secondary | ICD-10-CM | POA: Diagnosis not present

## 2016-01-01 ENCOUNTER — Telehealth: Payer: Self-pay | Admitting: Neurology

## 2016-01-01 DIAGNOSIS — F2089 Other schizophrenia: Secondary | ICD-10-CM | POA: Diagnosis not present

## 2016-01-01 NOTE — Telephone Encounter (Signed)
Con Memos, Case Mgr/[employer] [phone] ext [phone] called to inquire if patient could take GABAPENTIN for pain?, States patient has a lot of nerve type pain. Please call to advise.

## 2016-01-01 NOTE — Telephone Encounter (Signed)
Pt has 1 yr follow-up scheduled w/ Dr. Jannifer Franklin 01/16/16. Has been on Mobic for arthritic pain and Topamax for migraines

## 2016-01-01 NOTE — Telephone Encounter (Signed)
The patient will be evaluated through this office in a couple weeks. I will assess this issue at this time, we may add gabapentin at that time.

## 2016-01-04 DIAGNOSIS — F2089 Other schizophrenia: Secondary | ICD-10-CM | POA: Diagnosis not present

## 2016-01-09 NOTE — Telephone Encounter (Signed)
Called [employer] mail order pharmacy and spoke to [employer]. Pt had 90 day rx of meloxicam shipped out on 12/28/15 which was covered by insurance. He also has available refills. Will discuss additional meds at next wk's appt.

## 2016-01-09 NOTE — Telephone Encounter (Signed)
Pt called in stating his insurance is not allowing the pt to get Mobic. I have advised the pt Gabapentin will be discussed at his next appt. He expressed understanding.

## 2016-01-09 NOTE — Telephone Encounter (Signed)
Attempted to call Con Memos, case manager w/ [employer] but no answer and unable to leave VM mssg.

## 2016-01-12 DIAGNOSIS — F2089 Other schizophrenia: Secondary | ICD-10-CM | POA: Diagnosis not present

## 2016-01-16 ENCOUNTER — Encounter: Payer: Self-pay | Admitting: Neurology

## 2016-01-16 ENCOUNTER — Telehealth: Payer: Self-pay | Admitting: Neurology

## 2016-01-16 ENCOUNTER — Ambulatory Visit (INDEPENDENT_AMBULATORY_CARE_PROVIDER_SITE_OTHER): Payer: Commercial Managed Care - HMO | Admitting: Neurology

## 2016-01-16 VITALS — BP 126/84 | HR 51 | Ht 74.0 in | Wt 220.5 lb

## 2016-01-16 DIAGNOSIS — R569 Unspecified convulsions: Secondary | ICD-10-CM

## 2016-01-16 DIAGNOSIS — M25572 Pain in left ankle and joints of left foot: Secondary | ICD-10-CM

## 2016-01-16 DIAGNOSIS — G43709 Chronic migraine without aura, not intractable, without status migrainosus: Secondary | ICD-10-CM | POA: Diagnosis not present

## 2016-01-16 MED ORDER — TOPIRAMATE 50 MG PO TABS: 50.0000 mg | ORAL_TABLET | Freq: Two times a day (BID) | ORAL | 2 refills | Status: DC

## 2016-01-16 MED ORDER — GABAPENTIN 300 MG PO CAPS: 300.0000 mg | ORAL_CAPSULE | Freq: Three times a day (TID) | ORAL | 2 refills | Status: DC

## 2016-01-16 MED ORDER — GABAPENTIN 300 MG PO CAPS: 300.0000 mg | ORAL_CAPSULE | Freq: Three times a day (TID) | ORAL | 0 refills | Status: DC

## 2016-01-16 NOTE — Progress Notes (Signed)
Reason for visit: Headache  Steven Vincent is an 50 y.o. male  History of present illness:  Steven Vincent is a 50 year old right-handed white male with a history of migraine headache and a history of seizure episodes, the patient has been treated with low-dose Topamax taking 25 mg twice daily. The patient apparently is on Imitrex that he takes 100 mg if needed, his headaches have become much more frequent, and may occur almost daily at this time, lasting about an hour each headache. The patient is taking Imitrex frequently apparently. The patient has also noted some increased back pain and leg stiffness since June 2017. In the past he has seen a chiropractor for his back. The patient reports that it is becoming difficult to walk because of the stiffness. He denies any issues controlling the bowels or the bladder. He has not had any falls. He denies any further seizure-type events. The patient denies any numbness or tingly sensations down the legs. He comes to this office for an evaluation.  Past Medical History:  Diagnosis Date  . Arthritis   . Headache(784.0)   . Knee pain   . Low back pain   . Migraines   . Personality disorder   . Seizure disorder ([place])   . Seizures ([place])     Past Surgical History:  Procedure Laterality Date  . TOOTH EXTRACTION  12/2014   lower left    Family History  Problem Relation Age of Onset  . Heart disease Father   . Hyperlipidemia Father   . Hypertension Father   . Hypertension Brother     Social history:  reports that he quit smoking about 25 years ago. He has never used smokeless tobacco. He reports that he does not drink alcohol or use drugs.    Allergies  Allergen Reactions  . Aspirin     GI upset    Medications:  Prior to Admission medications   Medication Sig Start Date End Date Taking? Authorizing Provider  clonazePAM (KLONOPIN) 0.5 MG tablet  12/25/15  Yes Historical Provider, MD  OXcarbazepine (TRILEPTAL) 150 MG tablet  12/16/15   Yes Historical Provider, MD  topiramate (TOPAMAX) 25 MG tablet Take 2 tablets (50 mg total) by mouth every evening. 10/03/15  Yes Dennie Bible, NP  VOLTAREN 1 % GEL  01/15/16  Yes Historical Provider, MD    ROS:  Out of a complete 14 system review of symptoms, the patient complains only of the following symptoms, and all other reviewed systems are negative.  Joint pain, joint swelling, aching muscles Headache  There were no vitals taken for this visit.  Physical Exam  General: The patient is alert and cooperative at the time of the examination.  Neuromuscular: The patient has fairly good range of motion movement of the low back.  Skin: No significant peripheral edema is noted.   Neurologic Exam  Mental status: The patient is alert and oriented x 3 at the time of the examination. The patient has apparent normal recent and remote memory, with an apparently normal attention span and concentration ability.   Cranial nerves: Facial symmetry is present. Speech is normal, no aphasia or dysarthria is noted. Extraocular movements are full. Visual fields are full.  Motor: The patient has good strength in all 4 extremities.  Sensory examination: Soft touch sensation is symmetric on the face, arms, and legs.  Coordination: The patient has good finger-nose-finger and heel-to-shin bilaterally.  Gait and station: The patient has a slightly wide-based gait,  the patient is able to ambulate independently. Tandem gait is normal. Romberg is negative. No drift is seen.  Reflexes: Deep tendon reflexes are symmetric.   Assessment/Plan:  1. History of seizures, well controlled  2. History of headache  3. Chronic low back pain, leg discomfort and stiffness  The patient will be increased on the Topamax taking 50 mg twice daily. The patient will be placed on gabapentin working up to a 300 mg 3 times a day dosing regimen. If the patient continues to worsen with the back pain and leg  stiffness, we may consider MRI of the low back. The patient will follow-up in 6 months, sooner if needed.  Jill Alexanders MD 01/16/2016 9:36 AM  [facility] [address] [place], [place] [address]  Phone [phone] Fax [phone]

## 2016-01-16 NOTE — Telephone Encounter (Signed)
Today's OV notes faxed to Dr. Casimiro Needle per pt's request. F # [phone].

## 2016-01-16 NOTE — Patient Instructions (Signed)
   With the gabapentin 300 mg start one capsule at night for 1 week, then take one twice a day for 1 week, then take one capsule three times a day.  Neurontin (gabapentin) may result in drowsiness, ankle swelling, gait instability, or possibly dizziness. Please contact our office if significant side effects occur with this medication.

## 2016-01-16 NOTE — Telephone Encounter (Signed)
Pt says Dr Viona Gilmore started  gabapentin (NEURONTIN) 300 MG capsule today and Dr Casimiro Needle needs today's OV before he will refill clonazePAM (KLONOPIN) 0.5 MG tablet and OXcarbazepine (TRILEPTAL) 150 MG tablet .

## 2016-01-23 ENCOUNTER — Ambulatory Visit (INDEPENDENT_AMBULATORY_CARE_PROVIDER_SITE_OTHER): Payer: Commercial Managed Care - HMO | Admitting: Family Medicine

## 2016-01-23 ENCOUNTER — Encounter: Payer: Self-pay | Admitting: Family Medicine

## 2016-01-23 VITALS — BP 118/80 | HR 66 | Resp 12 | Ht 74.0 in | Wt 225.1 lb

## 2016-01-23 DIAGNOSIS — I499 Cardiac arrhythmia, unspecified: Secondary | ICD-10-CM | POA: Diagnosis not present

## 2016-01-23 DIAGNOSIS — M5440 Lumbago with sciatica, unspecified side: Secondary | ICD-10-CM

## 2016-01-23 DIAGNOSIS — M545 Low back pain, unspecified: Secondary | ICD-10-CM

## 2016-01-23 DIAGNOSIS — R0600 Dyspnea, unspecified: Secondary | ICD-10-CM

## 2016-01-23 DIAGNOSIS — R740 Nonspecific elevation of levels of transaminase and lactic acid dehydrogenase [LDH]: Secondary | ICD-10-CM

## 2016-01-23 DIAGNOSIS — G2581 Restless legs syndrome: Secondary | ICD-10-CM

## 2016-01-23 DIAGNOSIS — M5410 Radiculopathy, site unspecified: Secondary | ICD-10-CM | POA: Diagnosis not present

## 2016-01-23 DIAGNOSIS — R0609 Other forms of dyspnea: Secondary | ICD-10-CM

## 2016-01-23 DIAGNOSIS — R7401 Elevation of levels of liver transaminase levels: Secondary | ICD-10-CM

## 2016-01-23 NOTE — Patient Instructions (Addendum)
A few things to remember from today's visit:   Irregular heart rate - Plan: EKG 12-Lead, Ambulatory referral to Cardiology  Radicular pain of left lower extremity  Low back pain with radiation - Plan: Ambulatory referral to Chiropractic  Elevated transaminase level  Exertional dyspnea - Plan: Ambulatory referral to Cardiology  Continue following with neurologists, sport medicine, and psychiatrists. No labs done today because you are reporting labs done in 07/2015. After I receive records I may need labs done depending of results.  EKG done today extra beats, since you mention shortness of breath with some activities and dizziness cardiology referral will be arranged.    I can see you annually, before if needed for acute illness.    Please be sure medication list is accurate. If a new problem present, please set up appointment sooner than planned today.

## 2016-01-23 NOTE — Progress Notes (Signed)
Pre visit review using our clinic review tool, if applicable. No additional management support is needed unless otherwise documented below in the visit note. 
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
## 2016-01-24 NOTE — Telephone Encounter (Signed)
Pt called said he's had a "break-thru" migraine yesterday and 1 today. Also sts he is having pain in the left ankle which started recently.  Also he advised Dr Casimiro Needle office did not receive the OV

## 2016-01-24 NOTE — Telephone Encounter (Signed)
Pt was seen for OV on 01/16/16. Topamax was increased to 50 mg twice daily and he was placed on gabapentin working up to 300 mg 3 times a day. He also has Imitrex to use prn. This week, he has been experiencing migraine headaches.  OV was faxed to Dr. Emelda Brothers on 01/16/16 w/ confirmation that it was received. Will re-fax.

## 2016-01-24 NOTE — Addendum Note (Signed)
Addended by: Margette Fast on: 01/24/2016 05:46 PM   Modules accepted: Orders

## 2016-01-24 NOTE — Telephone Encounter (Signed)
I called patient. The patient is on Topamax taking 50 mg twice daily, he has had improvement of the headaches that were daily, and he has had 2 headaches this week, but the dose increase of the Topamax is only within the last 1 week. We will give him more time on the Topamax dose increase to see how he does.  The patient indicates significant sensitivity of the left ankle over the last 2 weeks. I will get an x-ray to look at this issue.

## 2016-01-25 ENCOUNTER — Telehealth: Payer: Self-pay | Admitting: Neurology

## 2016-01-25 ENCOUNTER — Telehealth: Payer: Self-pay | Admitting: Family Medicine

## 2016-01-25 ENCOUNTER — Ambulatory Visit
Admission: RE | Admit: 2016-01-25 | Discharge: 2016-01-25 | Disposition: A | Discharge status: Home / Self Care | Payer: Commercial Managed Care - HMO | Source: Ambulatory Visit | Attending: Neurology | Admitting: Neurology

## 2016-01-25 DIAGNOSIS — M25572 Pain in left ankle and joints of left foot: Secondary | ICD-10-CM

## 2016-01-25 DIAGNOSIS — M19072 Primary osteoarthritis, left ankle and foot: Secondary | ICD-10-CM | POA: Diagnosis not present

## 2016-01-25 MED ORDER — ROPINIROLE HCL 0.25 MG PO TABS: 0.2500 mg | ORAL_TABLET | Freq: Every day | ORAL | 0 refills | Status: DC

## 2016-01-25 NOTE — Telephone Encounter (Signed)
I called patient. The patient has been on Mobic before, he went off the medication 2 weeks ago, about the time that the ankle pain started. He is to go back on Mobic to see if this helps ankle. He takes 7.5 mg twice daily.

## 2016-01-25 NOTE — Telephone Encounter (Signed)
Pt called to advise aspirin upsets his stomach

## 2016-01-25 NOTE — Telephone Encounter (Signed)
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  Pt has benefited from this med and they prefer pcp prescribe on a regular basis.  Steven Vincent will advise pt to call our office when he needs the refill.

## 2016-01-25 NOTE — Telephone Encounter (Signed)
See below

## 2016-01-25 NOTE — Telephone Encounter (Signed)
Refill was sent to [facility]. Thanks, BJ

## 2016-01-25 NOTE — Telephone Encounter (Signed)
I called patient. X-ray the ankle shows mild arthritis, no fracture.  The patient lists allergies to aspirin, not sure what the reaction is.  If the patient could take a nonsteroidal anti-inflammatory medication, this may be a good option. He apparently is using Voltaren gel.  I will call back later and discussed the issue with him.

## 2016-01-29 DIAGNOSIS — F2089 Other schizophrenia: Secondary | ICD-10-CM | POA: Diagnosis not present

## 2016-02-02 DIAGNOSIS — M25572 Pain in left ankle and joints of left foot: Secondary | ICD-10-CM | POA: Diagnosis not present

## 2016-02-02 DIAGNOSIS — F2089 Other schizophrenia: Secondary | ICD-10-CM | POA: Diagnosis not present

## 2016-02-05 NOTE — Progress Notes (Deleted)
     HPI: 50 year old male for evaluation of dyspnea.   Current Outpatient Prescriptions  Medication Sig Dispense Refill  . clonazePAM (KLONOPIN) 0.5 MG tablet Take 0.5 mg by mouth daily.     Marland Kitchen gabapentin (NEURONTIN) 300 MG capsule Take 1 capsule (300 mg total) by mouth 3 (three) times daily. 90 capsule 0  . OXcarbazepine (TRILEPTAL) 150 MG tablet Take 150 mg by mouth 2 (two) times daily.     Marland Kitchen rOPINIRole (REQUIP) 0.25 MG tablet Take 1-2 tablets (0.25-0.5 mg total) by mouth at bedtime. 180 tablet 0  . SUMAtriptan (IMITREX) 100 MG tablet Take 100 mg by mouth daily. May repeat in 2 hours if headache persists or recurs.    . topiramate (TOPAMAX) 25 MG tablet Take 25 mg by mouth 2 (two) times daily.    . VOLTAREN 1 % GEL      No current facility-administered medications for this visit.     Allergies  Allergen Reactions  . Aspirin     GI upset    Past Medical History:  Diagnosis Date  . Arthritis   . Headache(784.0)   . Knee pain   . Low back pain   . Migraines   . Personality disorder   . Seizure disorder ([place])   . Seizures ([place])     Past Surgical History:  Procedure Laterality Date  . TOOTH EXTRACTION  12/2014   lower left    Social History   Social History  . Marital status: Married    Spouse name: N/A  . Number of children: N/A  . Years of education: N/A   Occupational History  . disabled Unemployed   Social History Main Topics  . Smoking status: Former Smoker    Quit date: 04/26/1990  . Smokeless tobacco: Never Used     Comment: stopped in 1992  . Alcohol use No  . Drug use: No  . Sexual activity: Yes   Other Topics Concern  . Not on file   Social History Narrative  . No narrative on file    Family History  Problem Relation Age of Onset  . Heart disease Father   . Hyperlipidemia Father   . Hypertension Father   . Hypertension Brother     ROS: no fevers or chills, productive cough, hemoptysis, dysphasia, odynophagia, melena, hematochezia,  dysuria, hematuria, rash, seizure activity, orthopnea, PND, pedal edema, claudication. Remaining systems are negative.  Physical Exam:   There were no vitals taken for this visit.  General:  Well developed/well nourished in NAD Skin warm/dry Patient not depressed No peripheral clubbing Back-normal HEENT-normal/normal eyelids Neck supple/normal carotid upstroke bilaterally; no bruits; no JVD; no thyromegaly chest - CTA/ normal expansion CV - RRR/normal S1 and S2; no murmurs, rubs or gallops;  PMI nondisplaced Abdomen -NT/ND, no HSM, no mass, + bowel sounds, no bruit 2+ femoral pulses, no bruits Ext-no edema, chords, 2+ DP Neuro-grossly nonfocal  ECG 01/23/2016-sinus rhythm, RV conduction delay, frequent PVCs.  A/P  1  Steven Ruths, MD

## 2016-02-07 ENCOUNTER — Telehealth: Payer: Self-pay | Admitting: Family Medicine

## 2016-02-07 NOTE — Telephone Encounter (Signed)
Okay to refill? 

## 2016-02-07 NOTE — Telephone Encounter (Signed)
Pt request refill  VOLTAREN 1 % GEL  Pt states the sports medicine dr suggested he get his pcp to fill\  [place]

## 2016-02-08 ENCOUNTER — Ambulatory Visit: Payer: Commercial Managed Care - HMO | Admitting: Cardiology

## 2016-02-08 MED ORDER — VOLTAREN 1 % TD GEL: TRANSDERMAL | 3 refills | Status: DC

## 2016-02-08 NOTE — Telephone Encounter (Signed)
Rx sent 

## 2016-02-08 NOTE — Telephone Encounter (Signed)
Ok to refill Voltaren gel 1% to continue qid as needed for knee OA, 4 tubes/3 refills.  Thanks, BJ

## 2016-02-09 ENCOUNTER — Encounter: Payer: Self-pay | Admitting: *Deleted

## 2016-02-12 ENCOUNTER — Telehealth: Payer: Self-pay | Admitting: Family Medicine
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
## 2016-02-13 DIAGNOSIS — F2089 Other schizophrenia: Secondary | ICD-10-CM | POA: Diagnosis not present

## 2016-02-13 NOTE — Telephone Encounter (Signed)
Called and spoke with Cardiologist office. They do not have any openings sooner than what patient is already scheduled for. Patient's name was put down on a wait list in case any openings come up. Called and spoke with patient and made him aware. He verbalized understanding.

## 2016-02-14 ENCOUNTER — Telehealth: Payer: Self-pay | Admitting: Family Medicine

## 2016-02-14 NOTE — Telephone Encounter (Signed)
° ° ° °  Pt call to ask if Dr Martinique need to check his blood to see if his meds might be causing him to have passing out ,dizzyness.    [phone]

## 2016-02-15 NOTE — Telephone Encounter (Signed)
Left voicemail letting patient know that if these symptoms are new that he does need to come in for a visit. Asked him to return our call.

## 2016-02-15 NOTE — Telephone Encounter (Signed)
See below

## 2016-02-15 NOTE — Telephone Encounter (Signed)
He needs an acute visit for ? Syncope or presyncope if this is something new. In regard to BP, he can check it at home, recommend getting a BP monitor, so he can do so.  Thanks, BJ

## 2016-02-16 NOTE — Telephone Encounter (Signed)
Spoke with patient. He is experiencing dizziness and his therapist thinks that he should get some blood work to see how he is doing with all of the new medications. Advised patient the earliest we could see him was Wednesday and he was okay with that. Advised patient if anything became worse or if he started to have trouble breathing to go to the emergency room. Patient verbalized understanding and is scheduled for 8:45am on Wednesday morning.

## 2016-02-21 ENCOUNTER — Ambulatory Visit (INDEPENDENT_AMBULATORY_CARE_PROVIDER_SITE_OTHER)
Admission: RE | Admit: 2016-02-21 | Discharge: 2016-02-21 | Disposition: A | Discharge status: Home / Self Care | Payer: Commercial Managed Care - HMO | Source: Ambulatory Visit | Attending: Family Medicine | Admitting: Family Medicine

## 2016-02-21 ENCOUNTER — Telehealth: Payer: Self-pay | Admitting: Family Medicine

## 2016-02-21 ENCOUNTER — Ambulatory Visit (INDEPENDENT_AMBULATORY_CARE_PROVIDER_SITE_OTHER): Payer: Commercial Managed Care - HMO | Admitting: Family Medicine

## 2016-02-21 ENCOUNTER — Encounter: Payer: Self-pay | Admitting: Family Medicine

## 2016-02-21 VITALS — BP 128/80 | HR 60 | Resp 12 | Ht 74.0 in | Wt 233.1 lb

## 2016-02-21 DIAGNOSIS — R0600 Dyspnea, unspecified: Secondary | ICD-10-CM

## 2016-02-21 DIAGNOSIS — R079 Chest pain, unspecified: Secondary | ICD-10-CM | POA: Diagnosis not present

## 2016-02-21 DIAGNOSIS — Z5181 Encounter for therapeutic drug level monitoring: Secondary | ICD-10-CM

## 2016-02-21 DIAGNOSIS — R55 Syncope and collapse: Secondary | ICD-10-CM

## 2016-02-21 DIAGNOSIS — R0602 Shortness of breath: Secondary | ICD-10-CM | POA: Diagnosis not present

## 2016-02-21 LAB — CBC
HCT: 42.1 % (ref 39.0–52.0)
Hemoglobin: 14.7 g/dL (ref 13.0–17.0)
MCHC: 35 g/dL (ref 30.0–36.0)
MCV: 90.9 fl (ref 78.0–100.0)
Platelets: 162 10*3/uL (ref 150.0–400.0)
RBC: 4.63 Mil/uL (ref 4.22–5.81)
RDW: 13.4 % (ref 11.5–15.5)
WBC: 7.5 10*3/uL (ref 4.0–10.5)

## 2016-02-21 LAB — COMPREHENSIVE METABOLIC PANEL
ALT: 21 U/L (ref 0–53)
AST: 21 U/L (ref 0–37)
Albumin: 4.1 g/dL (ref 3.5–5.2)
Alkaline Phosphatase: 78 U/L (ref 39–117)
BUN: 25 mg/dL — ABNORMAL HIGH (ref 6–23)
CO2: 26 mEq/L (ref 19–32)
Calcium: 9.2 mg/dL (ref 8.4–10.5)
Chloride: 110 mEq/L (ref 96–112)
Creatinine, Ser: 0.89 mg/dL (ref 0.40–1.50)
GFR: 95.96 mL/min (ref >60.00)
Glucose, Bld: 85 mg/dL (ref 70–99)
Potassium: 4.4 mEq/L (ref 3.5–5.1)
Sodium: 143 mEq/L (ref 135–145)
Total Bilirubin: 0.4 mg/dL (ref 0.2–1.2)
Total Protein: 6.3 g/dL (ref 6.0–8.3)

## 2016-02-21 LAB — TSH: TSH: 5.54 u[IU]/mL — ABNORMAL HIGH (ref 0.35–4.50)

## 2016-02-21 NOTE — Telephone Encounter (Signed)
Pt call to receive lab results from this morning.

## 2016-02-21 NOTE — Telephone Encounter (Signed)
Pt is aware lab are not back yet
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
## 2016-02-21 NOTE — Progress Notes (Signed)
Pre visit review using our clinic review tool, if applicable. No additional management support is needed unless otherwise documented below in the visit note. 

## 2016-02-21 NOTE — Telephone Encounter (Signed)
Error/ltd ° °

## 2016-02-21 NOTE — Telephone Encounter (Signed)
Tried contacting patient, line busy.   Labs are not back from this morning, no call was made to patient this morning.

## 2016-02-21 NOTE — Telephone Encounter (Signed)
Pt calling to check to see who may have called may it was about some lab work that was done this morning.

## 2016-02-21 NOTE — Patient Instructions (Addendum)
A few things to remember from today's visit:   Syncope, unspecified syncope type - Plan: EKG 12-Lead, Comprehensive metabolic panel, [id number], TSH, CBC  Medication monitoring encounter - Plan: 10-Hydroxycarbazepine  Keep appointment with cardiologists.  AVOID driving at least until cardiac evaluation.   Syncope Syncope is when you temporarily lose consciousness. Syncope may also be called fainting or passing out. It is caused by a sudden decrease in blood flow to the brain. Even though most causes of syncope are not dangerous, syncope can be a sign of a serious medical problem. Signs that you may be about to faint include:  Feeling dizzy or light-headed.  Feeling nauseous.  Seeing all white or all black in your field of vision.  Having cold, clammy skin. If you fainted, get medical help right away.Call your local emergency services (911 in the [place] Do not drive yourself to the hospital.  Follow these instructions at home: Pay attention to any changes in your symptoms. Take these actions to help with your condition:  Have someone stay with you until you feel stable.  Do not drive, use machinery, or play sports until your health care provider says it is okay.  Keep all follow-up visits as told by your health care provider. This is important.  If you start to feel like you might faint, lie down right away and raise (elevate) your feet above the level of your heart. Breathe deeply and steadily. Wait until all of the symptoms have passed.  Drink enough fluid to keep your urine clear or pale yellow.  If you are taking blood pressure or heart medicine, get up slowly and take several minutes to sit and then stand. This can reduce dizziness.  Take over-the-counter and prescription medicines only as told by your health care provider.   Get help right away if:  You have a severe headache.  You have unusual pain in your chest, abdomen, or back.  You are bleeding  from your mouth or rectum, or you have black or tarry stool.  You have a very fast or irregular heartbeat (palpitations).  You have pain with breathing.  You faint once or repeatedly.  You have a seizure.  You are confused.  You have trouble walking.  You have severe weakness.  You have vision problems.   These symptoms may represent a serious problem that is an emergency. Do not wait to see if your symptoms will go away. Get medical help right away. Call your local emergency services (911 in the [place] Do not drive yourself to the hospital.  This information is not intended to replace advice given to you by your health care provider. Make sure you discuss any questions you have with your health care provider. Document Released: 02/11/2005 Document Revised: 07/20/2015 Document Reviewed: 10/26/2014 Elsevier Interactive Patient Education  2017 [employer].    We have ordered labs or studies at this visit.  It can take up to 1-2 weeks for results and processing. IF results require follow up or explanation, we will call you with instructions. Clinically stable results will be released to your [facility]. If you have not heard from [place] or cannot find your results in [facility] in 2 weeks please contact our office at [phone].  If you are not yet signed up for [facility], please consider signing up  Please be sure medication list is accurate. If a new problem present, please set up appointment sooner than planned today.

## 2016-02-22 ENCOUNTER — Telehealth: Payer: Self-pay | Admitting: Family Medicine

## 2016-02-22 LAB — 10-HYDROXYCARBAZEPINE: Triliptal/MTB(Oxcarbazepin): 1.2 ug/mL — ABNORMAL LOW (ref 8.0–35.0)

## 2016-02-22 NOTE — Telephone Encounter (Signed)
Pt calling to get x-ray results from 02/21/16 @ 5ish and want to know when he can start driving again.  Pt is aware that Dr. Martinique is not in the office and will be returning on February 28, 2016.

## 2016-02-22 NOTE — Telephone Encounter (Signed)
See below

## 2016-02-23 NOTE — Telephone Encounter (Signed)
Pt called the office back.

## 2016-02-23 NOTE — Telephone Encounter (Signed)
Spoke to pt regarding transportation issues told him that [facility]  Would be able to transport him to and from the doctors office. Routed pt to speak with dustin for insurance issues.

## 2016-02-23 NOTE — Telephone Encounter (Signed)
Spoke with patient in regards to Dr. Martinique recommendations to hold off on driving. Pt verbalized understanding.

## 2016-02-23 NOTE — Telephone Encounter (Signed)
Pt called and wanted know whether or not he could drive.I told pt that Dr. Martinique recommended that he not drive until cardiology report. Pt verbalized understanding. Pt called back and wanted to know since he received his lab results could he drive I told him that I would ask Dr. Martinique and give him a call back. Also pt wants to know why he cannot continue care with Dr. Martinique over insurance. Told pt I would try and have someone contact him about the insurance problem.
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
## 2016-02-23 NOTE — Telephone Encounter (Signed)
Torry please call pt today. Pt has [place] access and that is the insurance issue
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
## 2016-03-01 ENCOUNTER — Ambulatory Visit (INDEPENDENT_AMBULATORY_CARE_PROVIDER_SITE_OTHER): Payer: Commercial Managed Care - HMO | Admitting: Cardiology

## 2016-03-01 ENCOUNTER — Encounter: Payer: Self-pay | Admitting: Cardiology

## 2016-03-01 VITALS — BP 116/78 | HR 88 | Ht 74.0 in | Wt 237.1 lb

## 2016-03-01 DIAGNOSIS — R55 Syncope and collapse: Secondary | ICD-10-CM | POA: Diagnosis not present

## 2016-03-01 DIAGNOSIS — I499 Cardiac arrhythmia, unspecified: Secondary | ICD-10-CM

## 2016-03-01 NOTE — Patient Instructions (Signed)
Medication Instructions:  Continue current medications  Labwork: None Ordered  Testing/Procedures: Your physician has requested that you have an exercise tolerance test. For further information please visit [email or web address]. Please also follow instruction sheet, as given.   Follow-Up: Your physician recommends that you schedule a follow-up appointment in: As Needed   Any Other Special Instructions Will Be Listed Below (If Applicable).   If you need a refill on your cardiac medications before your next appointment, please call your pharmacy.   

## 2016-03-03 ENCOUNTER — Encounter: Payer: Self-pay | Admitting: Cardiology

## 2016-03-05 ENCOUNTER — Telehealth (HOSPITAL_COMMUNITY): Payer: Self-pay

## 2016-03-05 NOTE — Telephone Encounter (Signed)
Encounter complete. I will attempt to reach pt again tomorrow.

## 2016-03-06 ENCOUNTER — Telehealth (HOSPITAL_COMMUNITY): Payer: Self-pay

## 2016-03-06 NOTE — Telephone Encounter (Signed)
Encounter complete. 

## 2016-03-07 ENCOUNTER — Ambulatory Visit (HOSPITAL_COMMUNITY)
Admission: RE | Admit: 2016-03-07 | Discharge: 2016-03-07 | Disposition: A | Discharge status: Home / Self Care | Payer: Commercial Managed Care - HMO | Source: Ambulatory Visit | Attending: Cardiology | Admitting: Cardiology

## 2016-03-07 DIAGNOSIS — I499 Cardiac arrhythmia, unspecified: Secondary | ICD-10-CM | POA: Insufficient documentation

## 2016-03-07 LAB — EXERCISE TOLERANCE TEST
Estimated workload: 10.1 METS
Exercise duration (min): 9 min
Exercise duration (sec): 1 s
MPHR: 170 {beats}/min
Peak HR: 146 {beats}/min
Percent HR: 85 %
RPE: 18
Rest HR: 58 {beats}/min

## 2016-03-11 ENCOUNTER — Telehealth: Payer: Self-pay | Admitting: Family Medicine

## 2016-03-11 NOTE — Telephone Encounter (Signed)
Pt would like results of stress test.  Also states his sinus are giving trouble and would like a rx for that sent to [place]

## 2016-03-11 NOTE — Telephone Encounter (Signed)
Pt is aware waiting on md °

## 2016-03-11 NOTE — Telephone Encounter (Signed)
I believe stress test was ordered and done by cardiologists after OV, most likely his office will contact him with results.  In regard to "sinuses" he can try OTC Allegra 180 mg and Rhinocort nasal spray. Nasal saline also may help.  Thanks, BJ

## 2016-03-12 ENCOUNTER — Telehealth: Payer: Self-pay | Admitting: Cardiology

## 2016-03-12 NOTE — Telephone Encounter (Signed)
Notes Recorded by Minus Breeding, MD on 03/10/2016 at 3:57 PM EST No ischemia on the POET (Plain Old Exercise Treadmill). No further work up. Call Mr. Umemoto with the results and send results to Betty Martinique, MD  Results reviewed with patient.  Patient stated he did get dizzy and was having a hard time catching his breath during test and something was "mentioned about his lungs".  He has never seen a pulmonologist  Will forward to Dr Percival Spanish for review

## 2016-03-12 NOTE — Telephone Encounter (Signed)
Pt returning your call concerning results.  Pt will be available until 1:30 pm, and pt states ok to leave voice mail.  He is only one that lives there.

## 2016-03-12 NOTE — Telephone Encounter (Signed)
New message ° ° ° ° ° °Calling to get stress test results °

## 2016-03-12 NOTE — Telephone Encounter (Signed)
This encounter was created in error - please disregard.

## 2016-03-12 NOTE — Telephone Encounter (Signed)
Tried contacting patient, unable to leave voicemail.

## 2016-03-12 NOTE — Telephone Encounter (Signed)
The patient called me stating that he had not received the results of his GXT on 03-07-16.  He would like to know the results and also have a copy of them sent to his PCP.  Please call him back at [phone].

## 2016-03-12 NOTE — Telephone Encounter (Signed)
Returned patient's call. Advised patient that cardiologist office should have his stress test results and he said that they were going to send them to [place]. Told him I would send the message back to you so that you could see if you have the results. Advised patient regarding his sinuses and he verbalized understanding.

## 2016-03-12 NOTE — Addendum Note (Signed)
Addended by: Alvina Filbert B on: 03/12/2016 03:22 PM   Modules accepted: Miquel Dunn

## 2016-03-15 NOTE — Telephone Encounter (Signed)
Advised patient

## 2016-03-15 NOTE — Telephone Encounter (Signed)
Please follow up with primary care MD.

## 2016-04-16 ENCOUNTER — Other Ambulatory Visit: Payer: Self-pay | Admitting: Family Medicine

## 2016-04-16 DIAGNOSIS — G2581 Restless legs syndrome: Secondary | ICD-10-CM

## 2016-04-22 ENCOUNTER — Telehealth: Payer: Self-pay | Admitting: Neurology

## 2016-04-22 NOTE — Telephone Encounter (Signed)
Pt said since increasing Topamax and starting gabapentin his tremors are worse. Said he did not do that before. Pt said he's had 2 migraines, one yesterday and one today. Please call before 4:30, pt leaves for work at 4:40

## 2016-04-22 NOTE — Telephone Encounter (Signed)
Dr Jannifer Franklin- please advise. You last saw patient 01/16/16

## 2016-04-22 NOTE — Telephone Encounter (Signed)
I called patient. The patient was placed on gabapentin in November for a history of back pain and headaches. The patient did not start the medication until January 2018. He claims that he has developed tremors on the medication. I will taper him down slowly on the drug by taking 300 mg twice daily for 2 weeks, 300 mg daily for 2 weeks, then stop. He will let me know if the tremors continue at that time.  He is also on Topamax which may help tremors as well, he is taking this for seizures and for headache.

## 2016-04-25 ENCOUNTER — Telehealth: Payer: Self-pay | Admitting: Neurology

## 2016-04-25 ENCOUNTER — Encounter: Payer: Self-pay | Admitting: Neurology

## 2016-04-25 NOTE — Telephone Encounter (Signed)
I called patient. Patient is working currently part-time as a Sports coach for [employer]. He claims that he is not able to work longer hours because of chronic low back pain. The patient is doing well with his seizures and headaches. The patient has a personality disorder, and he has been hospitalized at some point in the last year for psychiatric reasons.  I will write a letter to keep the patient at his current level of working.

## 2016-04-25 NOTE — Telephone Encounter (Signed)
Dr Willis- please advise 

## 2016-04-25 NOTE — Telephone Encounter (Signed)
Patient states he needs a letter regarding recommendation on his work status for disability.He now works 20 hours a week and disability wants to know if he can work full time.  Please call when letter is ready for pickup.

## 2016-07-15 ENCOUNTER — Encounter: Payer: Self-pay | Admitting: Adult Health

## 2016-07-15 ENCOUNTER — Ambulatory Visit (INDEPENDENT_AMBULATORY_CARE_PROVIDER_SITE_OTHER): Payer: Medicare HMO | Admitting: Adult Health

## 2016-07-15 ENCOUNTER — Encounter (INDEPENDENT_AMBULATORY_CARE_PROVIDER_SITE_OTHER): Payer: Self-pay

## 2016-07-15 VITALS — BP 116/74 | HR 56 | Ht 74.0 in | Wt 227.2 lb

## 2016-07-15 DIAGNOSIS — R569 Unspecified convulsions: Secondary | ICD-10-CM | POA: Diagnosis not present

## 2016-07-15 DIAGNOSIS — G43009 Migraine without aura, not intractable, without status migrainosus: Secondary | ICD-10-CM | POA: Diagnosis not present

## 2016-07-15 NOTE — Patient Instructions (Signed)
Continue Topamax 50 mg twice a day If your symptoms worsen or you develop new symptoms please let us know.
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
## 2016-07-15 NOTE — Progress Notes (Signed)
I have read the note, and I agree with the clinical assessment and plan.  Arria Naim KEITH   

## 2016-09-09 ENCOUNTER — Other Ambulatory Visit: Payer: Self-pay | Admitting: Family Medicine

## 2016-09-09 ENCOUNTER — Other Ambulatory Visit: Payer: Self-pay | Admitting: Nurse Practitioner

## 2016-09-09 ENCOUNTER — Other Ambulatory Visit: Payer: Self-pay | Admitting: Neurology

## 2016-09-09 DIAGNOSIS — G2581 Restless legs syndrome: Secondary | ICD-10-CM

## 2016-09-12 ENCOUNTER — Telehealth: Payer: Self-pay | Admitting: Family Medicine

## 2016-09-12 NOTE — Telephone Encounter (Signed)
Pt want to come back  And want know will you take him back.

## 2016-09-13 NOTE — Telephone Encounter (Signed)
Is this okay?

## 2016-09-23 NOTE — Telephone Encounter (Signed)
It is ok to re-establish care given the fact that the reason he stopped coming was related to issues with his health insurance. Thanks, BJ

## 2016-09-24 NOTE — Telephone Encounter (Signed)
Pt has been scheduled.  °

## 2016-10-13 NOTE — Progress Notes (Deleted)
HPI:   [name] is a 51 y.o. male, who is here today to follow on some chronic medical problems.  He was last seen here on 02/20/17. Since his last OV he has followed with cardiologists, Dr Percival Spanish due to irregular HR.  He also follows with neurologists, last seen by Dr Jannifer Franklin on 07/15/16. He has Hx of migraine and seizure disorder.  He follows with psychiatrist, Hx of schizotypal personality disorder.  Hx of SOB and fatigue.  02/2016 stress test done:Negative.  Abnormal TSH 01/2016. Lab Results  Component Value Date   TSH 5.54 (H) 02/21/2016    Concerns today: ***     Review of Systems    Current Outpatient Prescriptions on File Prior to Visit  Medication Sig Dispense Refill  . celecoxib (CELEBREX) 200 MG capsule Take 200 mg by mouth daily.    . clonazePAM (KLONOPIN) 0.5 MG tablet Take 0.5 mg by mouth daily.     Marland Kitchen gabapentin (NEURONTIN) 300 MG capsule Take 1 capsule (300 mg total) by mouth 3 (three) times daily. (Patient not taking: Reported on 07/15/2016) 90 capsule 0  . meloxicam (MOBIC) 7.5 MG tablet Take 1 tablet by mouth 2 (two) times daily.    . OXcarbazepine (TRILEPTAL) 150 MG tablet Take 75 mg by mouth daily.     Marland Kitchen rOPINIRole (REQUIP) 0.25 MG tablet TAKE 1 TO 2 TABLETS AT BEDTIME 180 tablet 1  . SUMAtriptan (IMITREX) 100 MG tablet Take 100 mg by mouth daily. May repeat in 2 hours if headache persists or recurs.    . SUMAtriptan (IMITREX) 100 MG tablet TAKE 1 TABLET AS NEEDED  FOR  MIGRAINE  (DO NOT EXCEED 2 TABLETS DAILY )  27 tablet 3  . tiZANidine (ZANAFLEX) 4 MG tablet Take 4 mg by mouth every 6 (six) hours as needed for muscle spasms.    Marland Kitchen topiramate (TOPAMAX) 50 MG tablet TAKE 1 TABLET TWICE DAILY 180 tablet 2  . VOLTAREN 1 % GEL Use four times a day as needed for knee. 4 Tube 3   No current facility-administered medications on file prior to visit.      Past Medical History:  Diagnosis Date  . Arthritis   . Headache(784.0)   .  Knee pain   . Low back pain   . Migraines   . Personality disorder   . Seizures ([facility])    Allergies  Allergen Reactions  . Aspirin     GI upset    Social History   Social History  . Marital status: Married    Spouse name: N/A  . Number of children: N/A  . Years of education: N/A   Occupational History  . disabled    Social History Main Topics  . Smoking status: Former Smoker    Quit date: 04/26/1990  . Smokeless tobacco: Never Used     Comment: stopped in 1992  . Alcohol use No  . Drug use: No  . Sexual activity: Yes   Other Topics Concern  . Not on file   Social History Narrative  . No narrative on file    There were no vitals filed for this visit. There is no height or weight on file to calculate BMI.      Physical Exam    ASSESSMENT AND PLAN:     There are no diagnoses linked to this encounter.         -[name] was advised to return sooner than planned  today if new concerns arise.       Tauri Ethington G. Martinique, MD  [facility]. [place] office.

## 2016-10-14 DIAGNOSIS — F2089 Other schizophrenia: Secondary | ICD-10-CM | POA: Diagnosis not present

## 2016-10-15 ENCOUNTER — Ambulatory Visit: Payer: Commercial Managed Care - HMO | Admitting: Family Medicine
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
## 2016-10-16 ENCOUNTER — Ambulatory Visit (INDEPENDENT_AMBULATORY_CARE_PROVIDER_SITE_OTHER): Payer: Medicare HMO | Admitting: Family Medicine

## 2016-10-16 ENCOUNTER — Encounter: Payer: Self-pay | Admitting: Family Medicine

## 2016-10-16 VITALS — BP 122/80 | HR 63 | Resp 12 | Ht 74.0 in | Wt 234.5 lb

## 2016-10-16 DIAGNOSIS — H9311 Tinnitus, right ear: Secondary | ICD-10-CM | POA: Diagnosis not present

## 2016-10-16 DIAGNOSIS — R946 Abnormal results of thyroid function studies: Secondary | ICD-10-CM | POA: Diagnosis not present

## 2016-10-16 DIAGNOSIS — R079 Chest pain, unspecified: Secondary | ICD-10-CM

## 2016-10-16 DIAGNOSIS — R42 Dizziness and giddiness: Secondary | ICD-10-CM | POA: Diagnosis not present

## 2016-10-16 DIAGNOSIS — R7989 Other specified abnormal findings of blood chemistry: Secondary | ICD-10-CM

## 2016-10-16 DIAGNOSIS — E785 Hyperlipidemia, unspecified: Secondary | ICD-10-CM | POA: Diagnosis not present

## 2016-10-16 DIAGNOSIS — Z5181 Encounter for therapeutic drug level monitoring: Secondary | ICD-10-CM | POA: Diagnosis not present

## 2016-10-16 DIAGNOSIS — Z008 Encounter for other general examination: Secondary | ICD-10-CM

## 2016-10-16 DIAGNOSIS — Z0189 Encounter for other specified special examinations: Secondary | ICD-10-CM | POA: Diagnosis not present

## 2016-10-16 LAB — BASIC METABOLIC PANEL
BUN: 25 mg/dL — ABNORMAL HIGH (ref 6–23)
CO2: 26 mEq/L (ref 19–32)
Calcium: 9.4 mg/dL (ref 8.4–10.5)
Chloride: 110 mEq/L (ref 96–112)
Creatinine, Ser: 1 mg/dL (ref 0.40–1.50)
GFR: 83.66 mL/min (ref >60.00)
Glucose, Bld: 94 mg/dL (ref 70–99)
Potassium: 4.3 mEq/L (ref 3.5–5.1)
Sodium: 142 mEq/L (ref 135–145)

## 2016-10-16 LAB — LIPID PANEL
Cholesterol: 134 mg/dL (ref 0–200)
HDL: 28 mg/dL — ABNORMAL LOW (ref >39.00)
LDL Cholesterol: 74 mg/dL (ref 0–99)
NonHDL: 105.66
Total CHOL/HDL Ratio: 5
Triglycerides: 156 mg/dL — ABNORMAL HIGH (ref 0.0–149.0)
VLDL: 31.2 mg/dL (ref 0.0–40.0)

## 2016-10-16 LAB — TSH: TSH: 4.84 u[IU]/mL — ABNORMAL HIGH (ref 0.35–4.50)

## 2016-10-16 LAB — VITAMIN B12: Vitamin B-12: 518 pg/mL (ref 211–911)

## 2016-10-16 LAB — VITAMIN D 25 HYDROXY (VIT D DEFICIENCY, FRACTURES): VITD: 45.27 ng/mL (ref 30.00–100.00)

## 2016-10-16 LAB — T4, FREE: Free T4: 0.99 ng/dL (ref 0.60–1.60)

## 2016-10-16 MED ORDER — MECLIZINE HCL 25 MG PO TABS: 25.0000 mg | ORAL_TABLET | Freq: Every evening | ORAL | 0 refills | Status: DC | PRN

## 2016-10-16 NOTE — Patient Instructions (Addendum)
A few things to remember from today's visit:   Encounter for biometric screening  Chest pain, unspecified type - Plan: EKG 35-TIRW, Basic metabolic panel  Encounter for medication monitoring - Plan: VITAMIN D 25 Hydroxy (Vit-D Deficiency, Fractures), Vitamin B12  Hyperlipidemia, unspecified hyperlipidemia type - Plan: Lipid panel  Abnormal TSH - Plan: TSH, T4, free  Tinnitus of right ear - Plan: Ambulatory referral to ENT  Dizziness, nonspecific - Plan: Ambulatory referral to ENT, meclizine (ANTIVERT) 25 MG tablet  Celebrex, Ibuprofen, and similar as well as Aspirin can aggravate ringing in the ear.    Dizziness is a perception of movement, it is sometimes difficult to describe and can be  caused by different problems, most benign but others can be life threaten.  Vertigo is the most common cause of dizziness, usually related with inner ear and can be associated with nausea, vomiting, and unbalance sensation. It can be complicated by falls due to lose of balance; so fall precautions are very important.  Most of the time dizziness is benign, usually intermittent, last a few seconds at the time and aggravated by certain positions. It usually resolves in a few weeks without residual effect but it could be recurrent.  Sometimes blood work is ordered to evaluate for other possible causes.  Dizziness can also be caused by certain medications, dehydration, migraines, and strokes.  Medication prescribed for vertigo, Meclizine, causes drowsiness/sleepiness, so frequently I recommended taking it at bedtime.    Seek immediate medical attention if: New severe headache, dobble vision, fever (100 F or more), associated numbness/tingling, focal weakness, persistent vomiting, not able to walk, or sudden worsening symptoms.  Please be sure medication list is accurate. If a new problem present, please set up appointment sooner than planned today.

## 2016-10-21 ENCOUNTER — Ambulatory Visit: Payer: Commercial Managed Care - HMO | Admitting: Family Medicine

## 2016-10-21 DIAGNOSIS — F819 Developmental disorder of scholastic skills, unspecified: Secondary | ICD-10-CM | POA: Diagnosis not present
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
## 2016-11-13 ENCOUNTER — Telehealth: Payer: Self-pay | Admitting: Neurology

## 2016-11-13 ENCOUNTER — Encounter: Payer: Self-pay | Admitting: Family Medicine

## 2016-11-13 ENCOUNTER — Ambulatory Visit (INDEPENDENT_AMBULATORY_CARE_PROVIDER_SITE_OTHER): Payer: Medicare HMO | Admitting: Family Medicine

## 2016-11-13 VITALS — BP 136/70 | HR 72 | Resp 12 | Ht 74.0 in | Wt 235.5 lb

## 2016-11-13 DIAGNOSIS — Z6830 Body mass index (BMI) 30.0-30.9, adult: Secondary | ICD-10-CM

## 2016-11-13 DIAGNOSIS — R51 Headache: Secondary | ICD-10-CM

## 2016-11-13 DIAGNOSIS — G4719 Other hypersomnia: Secondary | ICD-10-CM

## 2016-11-13 DIAGNOSIS — R079 Chest pain, unspecified: Secondary | ICD-10-CM

## 2016-11-13 DIAGNOSIS — R519 Headache, unspecified: Secondary | ICD-10-CM

## 2016-11-13 DIAGNOSIS — R0600 Dyspnea, unspecified: Secondary | ICD-10-CM

## 2016-11-13 DIAGNOSIS — E669 Obesity, unspecified: Secondary | ICD-10-CM

## 2016-11-13 DIAGNOSIS — R42 Dizziness and giddiness: Secondary | ICD-10-CM

## 2016-11-13 DIAGNOSIS — R413 Other amnesia: Secondary | ICD-10-CM | POA: Diagnosis not present

## 2016-11-13 DIAGNOSIS — R0609 Other forms of dyspnea: Secondary | ICD-10-CM

## 2016-11-13 DIAGNOSIS — Z6835 Body mass index (BMI) 35.0-35.9, adult: Secondary | ICD-10-CM | POA: Insufficient documentation

## 2016-11-13 DIAGNOSIS — Z6836 Body mass index (BMI) 36.0-36.9, adult: Secondary | ICD-10-CM | POA: Insufficient documentation

## 2016-11-13 NOTE — Telephone Encounter (Signed)
Called and spoke with patient. Scheduled work in visit for 11/21/16 at 51am, check in 700am. He verbalized understanding and appreciation for call.

## 2016-11-13 NOTE — Telephone Encounter (Signed)
Per CW,MD- okay to work patient in sooner

## 2016-11-13 NOTE — Telephone Encounter (Signed)
Pt called in he has saw PCP today for increased migraines and new onset of vertigo. He has an appt on 11/21 but he said PCP wants him seen sooner. Please call

## 2016-11-13 NOTE — Patient Instructions (Addendum)
A few things to remember from today's visit:   Chest pain, unspecified type  Headache, unspecified headache type  Daytime hypersomnolence  Class 1 obesity with body mass index (BMI) of 30.0 to 30.9 in adult, unspecified obesity type, unspecified whether serious comorbidity present  Appointment with ENT is pending.  This could your cardiologist office and arrange an appointment to discuss persistent chest pain. If you need any referral placed to me know.[phone]  Arrange appointment with her neurologist sooner than planned to worsening migraines as well as your memory issues.  Keep appointment with her therapist.  Please be sure medication list is accurate. If a new problem present, please set up appointment sooner than planned today.

## 2016-11-18 DIAGNOSIS — M1711 Unilateral primary osteoarthritis, right knee: Secondary | ICD-10-CM | POA: Diagnosis not present

## 2016-11-18 DIAGNOSIS — M1712 Unilateral primary osteoarthritis, left knee: Secondary | ICD-10-CM | POA: Diagnosis not present

## 2016-11-19 DIAGNOSIS — F819 Developmental disorder of scholastic skills, unspecified: Secondary | ICD-10-CM | POA: Diagnosis not present

## 2016-11-21 ENCOUNTER — Ambulatory Visit (INDEPENDENT_AMBULATORY_CARE_PROVIDER_SITE_OTHER): Payer: Medicare HMO | Admitting: Neurology

## 2016-11-21 ENCOUNTER — Encounter: Payer: Self-pay | Admitting: Neurology

## 2016-11-21 VITALS — BP 121/81 | HR 55 | Ht 74.0 in | Wt 232.0 lb

## 2016-11-21 DIAGNOSIS — G2581 Restless legs syndrome: Secondary | ICD-10-CM

## 2016-11-21 DIAGNOSIS — G43009 Migraine without aura, not intractable, without status migrainosus: Secondary | ICD-10-CM | POA: Diagnosis not present

## 2016-11-21 DIAGNOSIS — R569 Unspecified convulsions: Secondary | ICD-10-CM | POA: Diagnosis not present

## 2016-11-21 MED ORDER — RIZATRIPTAN BENZOATE 10 MG PO TABS: 10.0000 mg | ORAL_TABLET | Freq: Three times a day (TID) | ORAL | 3 refills | Status: DC | PRN

## 2016-11-21 MED ORDER — TOPIRAMATE 50 MG PO TABS: ORAL_TABLET | ORAL | 3 refills | Status: DC
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
## 2016-11-28 ENCOUNTER — Encounter: Payer: Self-pay | Admitting: Cardiology

## 2016-11-28 ENCOUNTER — Ambulatory Visit (INDEPENDENT_AMBULATORY_CARE_PROVIDER_SITE_OTHER): Payer: Medicare HMO | Admitting: Cardiology

## 2016-11-28 VITALS — BP 118/78 | HR 85 | Ht 74.0 in | Wt 237.2 lb

## 2016-11-28 DIAGNOSIS — I493 Ventricular premature depolarization: Secondary | ICD-10-CM | POA: Diagnosis not present

## 2016-11-28 DIAGNOSIS — R079 Chest pain, unspecified: Secondary | ICD-10-CM

## 2016-11-28 NOTE — Patient Instructions (Addendum)
Medication Instructions:  Continue current medications  If you need a refill on your cardiac medications before your next appointment, please call your pharmacy.  Labwork: None Ordered   Testing/Procedures: Your physician has recommended that you wear a holter monitor for 24 hours. Holter monitors are medical devices that record the heart's electrical activity. Doctors most often use these monitors to diagnose arrhythmias. Arrhythmias are problems with the speed or rhythm of the heartbeat. The monitor is a small, portable device. You can wear one while you do your normal daily activities. This is usually used to diagnose what is causing palpitations/syncope (passing out).   Follow-Up: Your physician wants you to follow-up in: 1 Month.    Thank you for choosing [facility] at [facility]!!

## 2016-11-29 ENCOUNTER — Encounter: Payer: Self-pay | Admitting: Cardiology

## 2016-12-12 ENCOUNTER — Ambulatory Visit (INDEPENDENT_AMBULATORY_CARE_PROVIDER_SITE_OTHER): Payer: Medicare HMO

## 2016-12-12 DIAGNOSIS — I493 Ventricular premature depolarization: Secondary | ICD-10-CM | POA: Diagnosis not present

## 2016-12-13 ENCOUNTER — Encounter: Payer: Self-pay | Admitting: Cardiology

## 2016-12-19 ENCOUNTER — Telehealth: Payer: Self-pay | Admitting: Cardiology

## 2016-12-19 NOTE — Telephone Encounter (Signed)
Closed Encounter  °

## 2016-12-23 DIAGNOSIS — M1711 Unilateral primary osteoarthritis, right knee: Secondary | ICD-10-CM | POA: Diagnosis not present

## 2016-12-23 DIAGNOSIS — M1712 Unilateral primary osteoarthritis, left knee: Secondary | ICD-10-CM | POA: Diagnosis not present

## 2016-12-25 ENCOUNTER — Telehealth: Payer: Self-pay | Admitting: Cardiology

## 2016-12-25 DIAGNOSIS — R079 Chest pain, unspecified: Secondary | ICD-10-CM

## 2016-12-25 DIAGNOSIS — I493 Ventricular premature depolarization: Secondary | ICD-10-CM

## 2016-12-25 NOTE — Telephone Encounter (Signed)
Notes recorded by Minus Breeding, MD on 12/23/2016 at 4:42 PM EDT Ectopy as listed. He is to have an echocardiogram but I don't think that this was scheduled. He needs to have the echo before his follow up with me in early Nov. Call Mr. Seeley with the results and send results to Martinique, Betty G, MD  Unable to leave message on VM. Echo ordered and message sent to scheduling to call pt and schedule appt. Will try to call later

## 2016-12-25 NOTE — Telephone Encounter (Signed)
New Message     Pt is returning [place] call for holter results

## 2016-12-30 ENCOUNTER — Ambulatory Visit: Payer: Medicare HMO | Admitting: Cardiology

## 2016-12-30 DIAGNOSIS — M1712 Unilateral primary osteoarthritis, left knee: Secondary | ICD-10-CM | POA: Diagnosis not present

## 2016-12-30 DIAGNOSIS — M1711 Unilateral primary osteoarthritis, right knee: Secondary | ICD-10-CM | POA: Diagnosis not present

## 2016-12-30 NOTE — Telephone Encounter (Signed)
Echo schedule for 11/07 @7 :30

## 2016-12-31 DIAGNOSIS — F819 Developmental disorder of scholastic skills, unspecified: Secondary | ICD-10-CM | POA: Diagnosis not present

## 2017-01-01 ENCOUNTER — Other Ambulatory Visit (HOSPITAL_COMMUNITY): Payer: Medicare HMO
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
## 2017-01-02 ENCOUNTER — Ambulatory Visit (INDEPENDENT_AMBULATORY_CARE_PROVIDER_SITE_OTHER): Payer: Medicare HMO | Admitting: Cardiology

## 2017-01-02 ENCOUNTER — Encounter: Payer: Self-pay | Admitting: Cardiology

## 2017-01-02 VITALS — BP 110/74 | HR 64 | Ht 74.0 in | Wt 234.4 lb

## 2017-01-02 DIAGNOSIS — I493 Ventricular premature depolarization: Secondary | ICD-10-CM | POA: Insufficient documentation

## 2017-01-02 DIAGNOSIS — R55 Syncope and collapse: Secondary | ICD-10-CM | POA: Diagnosis not present

## 2017-01-02 NOTE — Patient Instructions (Addendum)
Medication Instructions:  Continue current medications  If you need a refill on your cardiac medications before your next appointment, please call your pharmacy.  Labwork: None Ordered   Testing/Procedures: Your physician has requested that you have an echocardiogram. Echocardiography is a painless test that uses sound waves to create images of your heart. It provides your doctor with information about the size and shape of your heart and how well your heart's chambers and valves are working. This procedure takes approximately one hour. There are no restrictions for this procedure.   Follow-Up: Your physician wants you to follow-up in: 2 Months.   16% of the time your heart skipped beats   Thank you for choosing [facility] at [facility]!!

## 2017-01-03 ENCOUNTER — Ambulatory Visit: Payer: Medicare HMO | Admitting: Cardiology

## 2017-01-06 ENCOUNTER — Telehealth: Payer: Self-pay | Admitting: Cardiology

## 2017-01-06 DIAGNOSIS — M1711 Unilateral primary osteoarthritis, right knee: Secondary | ICD-10-CM | POA: Diagnosis not present

## 2017-01-06 DIAGNOSIS — M1712 Unilateral primary osteoarthritis, left knee: Secondary | ICD-10-CM | POA: Diagnosis not present

## 2017-01-06 NOTE — Telephone Encounter (Signed)
Patient called to confirm his ECHO appointment on 11/15.

## 2017-01-06 NOTE — Telephone Encounter (Signed)
Fu Message ° °Pt states he is returning RN call. Please call back to discuss  °

## 2017-01-09 ENCOUNTER — Other Ambulatory Visit: Payer: Self-pay

## 2017-01-09 ENCOUNTER — Ambulatory Visit (HOSPITAL_COMMUNITY): Payer: Medicare HMO | Attending: Cardiology

## 2017-01-09 DIAGNOSIS — I493 Ventricular premature depolarization: Secondary | ICD-10-CM

## 2017-01-09 DIAGNOSIS — R079 Chest pain, unspecified: Secondary | ICD-10-CM | POA: Diagnosis not present

## 2017-01-10 ENCOUNTER — Telehealth: Payer: Self-pay | Admitting: Cardiology

## 2017-01-10 NOTE — Telephone Encounter (Signed)
Pt would like his Echo results from yesterday please.

## 2017-01-10 NOTE — Telephone Encounter (Signed)
Preliminary reviewed with pt will await further interpretation from MD, please leave detailed message on VM if unavailable

## 2017-01-13 NOTE — Telephone Encounter (Signed)
Follow up      Patient returning call for echo results

## 2017-01-13 NOTE — Telephone Encounter (Signed)
Follow up     Have you heard anything from Dr Percival Spanish about starting medication for the skipped beats?

## 2017-01-13 NOTE — Telephone Encounter (Signed)
Returned call to give pt results of echo - normal findings. Pt asked what would be done about his skipped beats - holter monitor worn on 10/18.  Per notes from 11/8 OV looks like he might be started on a beta blocker pending echo results - Routed to Dr. Percival Spanish to advise.

## 2017-01-13 NOTE — Telephone Encounter (Signed)
Pt called back to say if new med is recommended, can be called in to [facility], & that if he doesn't answer when called OK to leave detailed msg on his phone with recommendations.

## 2017-01-13 NOTE — Telephone Encounter (Signed)
Left a message to call back.

## 2017-01-14 MED ORDER — METOPROLOL TARTRATE 25 MG PO TABS: 25.0000 mg | ORAL_TABLET | Freq: Two times a day (BID) | ORAL | 1 refills | Status: DC

## 2017-01-14 NOTE — Telephone Encounter (Signed)
I spoke to this patient yesterday, he was aware that this may not be addressed this week d/t holidays, and he had voiced that he was OK w a followup response next week.

## 2017-01-14 NOTE — Telephone Encounter (Signed)
Returned call, phone rang 20+ times, no answer or voice mail pickup.

## 2017-01-14 NOTE — Telephone Encounter (Signed)
Let's start with metoprolol 25 mg bid po.  Disp number 60 with 3 refills.  I should see him in about one month.

## 2017-01-14 NOTE — Telephone Encounter (Signed)
Returned call to patient and explained recommendation  Discussed in detail medication intended outcomes, side effects, advised of normal vs potential undesired effects of med.  Rx called to pharmacy (local [facility] per pt request, as well as [facility]).   Aware I will reach out to schedulers to get pt in for f/u visit.  Pt verbalized understanding and thanks.

## 2017-01-14 NOTE — Telephone Encounter (Signed)
Steven Vincent is returning a call. Thanks

## 2017-01-14 NOTE — Telephone Encounter (Signed)
Follow up     Needs call back before 330pm , are you putting him on medication?  Leave message on vm if you do not get him

## 2017-01-14 NOTE — Telephone Encounter (Signed)
F/u Message  Pt call back to speak with the RN.

## 2017-01-15 ENCOUNTER — Ambulatory Visit: Payer: Medicare HMO | Admitting: Neurology

## 2017-01-15 ENCOUNTER — Telehealth: Payer: Self-pay | Admitting: Cardiology

## 2017-01-15 NOTE — Telephone Encounter (Signed)
Closed Encounter  °

## 2017-01-20 ENCOUNTER — Ambulatory Visit (INDEPENDENT_AMBULATORY_CARE_PROVIDER_SITE_OTHER): Payer: Medicare HMO | Admitting: Pulmonary Disease

## 2017-01-20 ENCOUNTER — Encounter: Payer: Self-pay | Admitting: Pulmonary Disease

## 2017-01-20 VITALS — BP 110/72 | HR 51 | Ht 74.0 in | Wt 236.6 lb

## 2017-01-20 DIAGNOSIS — Z87898 Personal history of other specified conditions: Secondary | ICD-10-CM

## 2017-01-20 DIAGNOSIS — R29818 Other symptoms and signs involving the nervous system: Secondary | ICD-10-CM

## 2017-01-20 DIAGNOSIS — G4762 Sleep related leg cramps: Secondary | ICD-10-CM

## 2017-01-20 NOTE — Patient Instructions (Signed)
Will arrange for in sleep study Will call to arrange for follow up after sleep study reviewed
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
## 2017-01-20 NOTE — Progress Notes (Signed)
   Subjective:    Patient ID: Steven Vincent, male    DOB: [birth year], 51 y.o.   MRN: [id number]  HPI    Review of Systems  Constitutional: Negative for fever and unexpected weight change.  HENT: Positive for dental problem. Negative for congestion, ear pain, nosebleeds, postnasal drip, rhinorrhea, sinus pressure, sneezing, sore throat and trouble swallowing.   Eyes: Negative for redness and itching.  Respiratory: Positive for chest tightness and shortness of breath. Negative for cough and wheezing.   Cardiovascular: Positive for palpitations and leg swelling.  Gastrointestinal: Negative for nausea and vomiting.  Genitourinary: Negative for dysuria.  Musculoskeletal: Positive for joint swelling.  Skin: Negative for rash.  Allergic/Immunologic: Negative.  Negative for environmental allergies, food allergies and immunocompromised state.  Neurological: Positive for headaches.  Hematological: Does not bruise/bleed easily.  Psychiatric/Behavioral: Negative for dysphoric mood. The patient is not nervous/anxious.        Objective:   Physical Exam        Assessment & Plan:

## 2017-01-23 ENCOUNTER — Encounter: Payer: Commercial Managed Care - HMO | Admitting: Family Medicine

## 2017-01-29 DIAGNOSIS — F819 Developmental disorder of scholastic skills, unspecified: Secondary | ICD-10-CM | POA: Diagnosis not present

## 2017-02-10 NOTE — Progress Notes (Signed)
Cardiology Office Note   Date:  02/13/2017   ID:  Steven Vincent, DOB 08-30-1965, MRN [id number]  PCP:  [place], Betty G, MD  Cardiologist:   Minus Breeding, MD   Chief Complaint  Patient presents with  . Dizziness      History of Present Illness: Steven Vincent is a 51 y.o. male who presents for evaluation of possible syncope.   The events surrounding this did not confirm that this was in fact syncope.  He had a negative POET (Plain Old Exercise Treadmill) .   He had a Holter with PVCs with bigeminy, trigeminy and a triplet.   At the last visit I sent him for an echo which was unremarkable.   His biggest complaint is fatigue.  He has not had any syncope with trauma.  He says he can sit down and be out for 30 minutes quickly and it sounds more like he is falling asleep.  He does not really notice any palpitations, presyncope or syncope.  He has some dizziness when he bends over but this has been chronic.  He is not describing any chest pressure, neck or arm discomfort.  He has had no change in symptoms since he had a normal stress test in January and an echocardiogram was unremarkable.   Past Medical History:  Diagnosis Date  . Arthritis   . Baker's cyst   . Headache(784.0)   . Knee pain   . Low back pain   . Migraines   . Personality disorder ([place])   . Seizures ([place])     Past Surgical History:  Procedure Laterality Date  . TOOTH EXTRACTION  12/2014   lower left     Current Outpatient Medications  Medication Sig Dispense Refill  . diclofenac sodium (VOLTAREN) 1 % GEL Apply topically as directed.    . metoprolol tartrate (LOPRESSOR) 25 MG tablet Take 1 tablet (25 mg total) by mouth 2 (two) times daily. 180 tablet 1  . topiramate (TOPAMAX) 25 MG tablet Take 25 mg by mouth 2 (two) times daily.     No current facility-administered medications for this visit.     Allergies:   Aspirin    ROS:  Please see the history of present illness.   Otherwise, review of  systems are positive for none.  All other systems are reviewed and negative.    PHYSICAL EXAM: VS:  BP 114/67   Pulse (!) 52   Ht 6\' 2"  (1.88 m)   Wt 239 lb (108.4 kg)   BMI 30.69 kg/m  , BMI Body mass index is 30.69 kg/m.  GENERAL:  Well appearing NECK:  No jugular venous distention, waveform within normal limits, carotid upstroke brisk and symmetric, no bruits, no thyromegaly LUNGS:  Clear to auscultation bilaterally CHEST:  Unremarkable HEART:  PMI not displaced or sustained,S1 and S2 within normal limits, no S3, no S4, no clicks, no rubs, no murmurs ABD:  Flat, positive bowel sounds normal in frequency in pitch, no bruits, no rebound, no guarding, no midline pulsatile mass, no hepatomegaly, no splenomegaly EXT:  2 plus pulses throughout, no edema, no cyanosis no clubbing   EKG:  EKG is not  ordered today.   Recent Labs: 02/21/2016: ALT 21; Hemoglobin 14.7; Platelets 162.0 10/16/2016: BUN 25; Creatinine, Ser 1.00; Potassium 4.3; Sodium 142; TSH 4.84    Lipid Panel    Component Value Date/Time   CHOL 134 10/16/2016 0900   TRIG 156.0 (H) 10/16/2016 0900   TRIG 231 (  HH) 12/27/2005 1005   HDL 28.00 (L) 10/16/2016 0900   CHOLHDL 5 10/16/2016 0900   VLDL 31.2 10/16/2016 0900   LDLCALC 74 10/16/2016 0900   LDLDIRECT 70.8 12/17/2007 0829      Wt Readings from Last 3 Encounters:  02/13/17 239 lb (108.4 kg)  01/20/17 236 lb 9.6 oz (107.3 kg)  01/02/17 234 lb 6.4 oz (106.3 kg)      Other studies Reviewed: Additional studies/ records that were reviewed today include:   Review of the above records demonstrates:       ASSESSMENT AND PLAN:  PVCs:   These have any symptoms related to this.  I actually did not appreciate any during the exam today.     His QT is slightly prolonged but I do not have evidence that this is related.  He has not had any syncope that I can document.  No change in therapy is planned at this point.  QUESTIONABLE SYNCOPE: He may have a sleep disorder  and he is scheduled to have a sleep study.  I will follow him up after that and if he has not had resolution of his symptoms I will consider further workup although at this point there are no data to suggest that he has actual syncope.   CHEST PAIN:   This is atypical.  He has no change in this.  It seems to be positional and related to previous trauma with a softball.  However, I might consider further imaging if he has any continued or change in his symptoms.   Current medicines are reviewed at length with the patient today.  The patient does not have concerns regarding medicines.  The following changes have been made:  None   Labs/ tests ordered today include:   None  No orders of the defined types were placed in this encounter.    Disposition:   FU with me in 3 months.   Signed, Minus Breeding, MD  02/13/2017 1:04 PM    [place]

## 2017-02-12 DIAGNOSIS — M545 Low back pain: Secondary | ICD-10-CM | POA: Diagnosis not present

## 2017-02-13 ENCOUNTER — Ambulatory Visit (INDEPENDENT_AMBULATORY_CARE_PROVIDER_SITE_OTHER): Payer: Medicare HMO | Admitting: Cardiology

## 2017-02-13 ENCOUNTER — Encounter: Payer: Self-pay | Admitting: Cardiology

## 2017-02-13 VITALS — BP 114/67 | HR 52 | Ht 74.0 in | Wt 239.0 lb

## 2017-02-13 DIAGNOSIS — I493 Ventricular premature depolarization: Secondary | ICD-10-CM

## 2017-02-13 DIAGNOSIS — R42 Dizziness and giddiness: Secondary | ICD-10-CM

## 2017-02-13 NOTE — Patient Instructions (Signed)
Medication Instructions:  Continue current medications  If you need a refill on your cardiac medications before your next appointment, please call your pharmacy.  Labwork: None Ordered   Testing/Procedures: None Ordered  Special Instructions:  Happy Holidays!!!  Follow-Up: Your physician wants you to follow-up in: 3 Months.    Thank you for choosing [facility] at [facility]!!

## 2017-02-14 ENCOUNTER — Ambulatory Visit: Payer: Medicare HMO | Attending: Orthopaedic Surgery | Admitting: Physical Therapy

## 2017-02-14 ENCOUNTER — Encounter: Payer: Self-pay | Admitting: Physical Therapy

## 2017-02-14 ENCOUNTER — Ambulatory Visit: Payer: Medicare HMO | Admitting: Physical Therapy

## 2017-02-14 DIAGNOSIS — M545 Low back pain: Secondary | ICD-10-CM | POA: Insufficient documentation

## 2017-02-14 DIAGNOSIS — M6281 Muscle weakness (generalized): Secondary | ICD-10-CM | POA: Diagnosis not present

## 2017-02-14 DIAGNOSIS — G8929 Other chronic pain: Secondary | ICD-10-CM | POA: Diagnosis not present

## 2017-02-14 NOTE — Patient Instructions (Signed)
Shequila Neglia PT [facility] [address], [address] [place], [place] [address] Phone # [phone] Fax [phone]    
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
## 2017-02-15 ENCOUNTER — Ambulatory Visit (HOSPITAL_BASED_OUTPATIENT_CLINIC_OR_DEPARTMENT_OTHER): Payer: Medicare HMO | Attending: Pulmonary Disease | Admitting: Pulmonary Disease

## 2017-02-15 VITALS — Ht 74.0 in | Wt 230.0 lb

## 2017-02-15 DIAGNOSIS — G4762 Sleep related leg cramps: Secondary | ICD-10-CM | POA: Diagnosis not present

## 2017-02-15 DIAGNOSIS — G4733 Obstructive sleep apnea (adult) (pediatric): Secondary | ICD-10-CM | POA: Insufficient documentation

## 2017-02-15 DIAGNOSIS — R569 Unspecified convulsions: Secondary | ICD-10-CM | POA: Diagnosis not present

## 2017-02-15 DIAGNOSIS — G4730 Sleep apnea, unspecified: Secondary | ICD-10-CM | POA: Diagnosis present

## 2017-02-15 DIAGNOSIS — Z87898 Personal history of other specified conditions: Secondary | ICD-10-CM

## 2017-02-15 DIAGNOSIS — R29818 Other symptoms and signs involving the nervous system: Secondary | ICD-10-CM

## 2017-02-19 ENCOUNTER — Ambulatory Visit: Payer: Medicare HMO | Admitting: Physical Therapy

## 2017-02-19 ENCOUNTER — Encounter: Payer: Self-pay | Admitting: Physical Therapy

## 2017-02-19 DIAGNOSIS — M545 Low back pain: Principal | ICD-10-CM

## 2017-02-19 DIAGNOSIS — G8929 Other chronic pain: Secondary | ICD-10-CM

## 2017-02-19 DIAGNOSIS — M6281 Muscle weakness (generalized): Secondary | ICD-10-CM | POA: Diagnosis not present

## 2017-02-19 NOTE — Therapy (Signed)
[facility] [address], [place], [place], [address] Phone: [phone]   Fax:  [phone]  Physical Therapy Treatment  Patient Details  Name: Steven Vincent MRN: [id number] Date of Birth: 1965-12-06 Referring Provider: Dr Rhona Raider    Encounter Date: 02/19/2017  PT End of Session - 02/19/17 0755    Visit Number  2    Date for PT Re-Evaluation  04/11/17    Authorization Type  Medicare    PT Start Time  6962    PT Stop Time  0905    PT Time Calculation (min)  70 min    Activity Tolerance  Patient tolerated treatment well    Behavior During Therapy  [facility] for tasks assessed/performed       Past Medical History:  Diagnosis Date  . Arthritis   . Baker's cyst   . Headache(784.0)   . Knee pain   . Low back pain   . Migraines   . Personality disorder ([place])   . Seizures ([place])     Past Surgical History:  Procedure Laterality Date  . TOOTH EXTRACTION  12/2014   lower left    There were no vitals filed for this visit.  Subjective Assessment - 02/19/17 0800    Subjective  Pt had a lot of pain yesterday and still hurts today. Pt has antalgic gait and moves significantly guarded. Reports he stopped the extension exercises because it increased pain in his buttocks.     Currently in Pain?  -- Getting out of car 9/10, lessens with supine    Pain Location  Buttocks    Pain Orientation  Left    Aggravating Factors   sitting    Pain Relieving Factors  laying down    Multiple Pain Sites  No                      [facility] Adult PT Treatment/Exercise - 02/19/17 0001      Self-Care   Self-Care  Posture;Heat/Ice Application;Other Self-Care Comments    Posture  Use of lumbar support    Heat/Ice Application  Use of heat and ice    Other Self-Care Comments   How to modify seat in car to be more lumbar supportive,      Lumbar Exercises: Supine   Other Supine Lumbar Exercises  Breathing exercises for  relaxation due to pain.    Other Supine Lumbar Exercises  TA contraciton 6x: VC to do slow.      Lumbar Exercises: Sidelying   Other Sidelying Lumbar Exercises  TA contractions with breathing exercises for pain. 6x Vc to breathe deeply  and slowly.  Pillows bt legs.      Moist Heat Therapy   Number Minutes Moist Heat  20 Minutes    Moist Heat Location  Lumbar Spine;Hip      Electrical Stimulation   Electrical Stimulation Location  Lt hip/lumbar    Electrical Stimulation Action  IFC    Electrical Stimulation Parameters  to toleance, on RT side    Electrical Stimulation Goals  Pain      Manual Therapy   Manual Therapy  Soft tissue mobilization;Myofascial release;Manual Traction    Soft tissue mobilization  Lt lumbar paraspinals, LT QL    Myofascial Release  Instrument asst feathering for pain followed by medium pressure downgrading to lumbar, hip lateral and posterior in SL position,     Manual Traction  LTLE, knee bent gentle traction 3x 20 sec  PT Short Term Goals - 02/14/17 1139      PT SHORT TERM GOAL #1   Title  The patient will demonstrate basic knowledge of body mechanics and self care strategies for back pain relief    Time  4    Period  Weeks    Target Date  03/14/17      PT SHORT TERM GOAL #2   Title  The patient will report a 25% improvement in back and left hip pain with his usual home and work ADLs    Time  4    Period  Weeks    Status  New      PT SHORT TERM GOAL #3   Title  The patient will have improved hip and spinal mobility and ROM needed for greater ease with sit to stand    Time  4    Period  Weeks    Status  New      PT SHORT TERM GOAL #4   Title  ...      PT SHORT TERM GOAL #5   Title  ...        PT Long Term Goals - 02/14/17 1143      PT LONG TERM GOAL #1   Title  Be independent with advanced HEP     Time  8    Period  Weeks    Status  New    Target Date  04/11/17      PT LONG TERM GOAL #2   Title  Reduce FOTO  limitation to < or = 31%     Time  8    Period  Weeks    Status  New      PT LONG TERM GOAL #3   Title  Core strength to grossly 4+/5 needed for greater ease with sit to stand and standing tolerance for work    Time  8    Period  Weeks    Status  New      PT LONG TERM GOAL #4   Title  Report 50% decrease in back and Lt. hip pain with usual ADLs    Time  8    Period  Weeks    Status  New      PT LONG TERM GOAL #5   Title  Lumbar flexion ROM improved to 45 degrees and extension to 25 degrees needed for mobility with ADLs and work    Time  8    Period  Weeks    Status  New            Plan - 02/19/17 0756    Clinical Impression Statement  Pt very emotional today, telling his story, teary throughout. Pain quite high this AM which he relates to sitting in his car to get to todays appt. He tolerated hooklying and sidelying well, abolishing pain. Due to pt's high emotional state today we avoided any movements that were painful including extension. Pt reports he has chosen to stop the extensions at home as they increased his buttocks pain. Pt was instructed in ways to modify the car seat  that gives him better support  and more neutral spine/pelvis. Too ealrly for goal attainment.     Rehab Potential  Good    Clinical Impairments Affecting Rehab Potential  may need simple explanations and frequent review of info and HEP    PT Frequency  2x / week    PT Duration  8 weeks  PT Treatment/Interventions  ADLs/Self Care Home Management;Neuromuscular re-education;Dry needling;Manual techniques;Taping;Moist Heat;Traction;Ultrasound;Electrical Stimulation;Cryotherapy;Therapeutic exercise;Therapeutic activities;Patient/family education    PT Next Visit Plan  Asses pain and proceed from there. Pt may be ready for more exercise/movement.    Consulted and Agree with Plan of Care  Patient       Patient will benefit from skilled therapeutic intervention in order to improve the following deficits  and impairments:  Pain, Postural dysfunction, Decreased range of motion, Decreased strength, Decreased mobility  Visit Diagnosis: Chronic bilateral low back pain, with sciatica presence unspecified  Muscle weakness (generalized)     Problem List Patient Active Problem List   Diagnosis Date Noted  . Syncope 01/02/2017  . PVC (premature ventricular contraction) 01/02/2017  . Class 1 obesity with body mass index (BMI) of 30.0 to 30.9 in adult 11/13/2016  . Chest pain, unspecified 10/16/2016  . Hyperlipidemia 10/16/2016  . Radicular pain of left lower extremity 01/23/2016  . RLS (restless legs syndrome) 01/23/2016  . Hemiballismus 01/17/2015  . Migraine without aura, without mention of intractable migraine without mention of status migrainosus 01/19/2013  . Migraine 08/03/2010  . Hip pain, left 04/26/2010  . Hemorrhoids, internal, with bleeding 04/26/2010  . Daytime hypersomnolence 11/14/2009  . REACTIVE HYPOGLYCEMIA 04/13/2008  . WEIGHT GAIN 04/13/2008  . SCHIZOTYPAL PERSONALITY DISORDER 07/07/2007  . Low back pain with radiation 11/06/2006  . Convulsions ([place]) 11/06/2006  . Headache, unspecified headache type 11/06/2006    Maruice Pieroni, PTA 02/19/2017, 8:54 AM  [facility] [address] [place], [place], [address] Phone: [phone]   Fax:  [phone]  Name: Neel Buffone MRN: [id number] Date of Birth: 04/28/1965

## 2017-02-21 ENCOUNTER — Ambulatory Visit: Payer: Medicare HMO | Admitting: Physical Therapy

## 2017-02-21 DIAGNOSIS — G8929 Other chronic pain: Secondary | ICD-10-CM

## 2017-02-21 DIAGNOSIS — M545 Low back pain: Principal | ICD-10-CM

## 2017-02-21 DIAGNOSIS — M6281 Muscle weakness (generalized): Secondary | ICD-10-CM
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
## 2017-02-22 ENCOUNTER — Other Ambulatory Visit: Payer: Self-pay

## 2017-02-22 ENCOUNTER — Encounter (HOSPITAL_COMMUNITY): Payer: Self-pay | Admitting: Emergency Medicine

## 2017-02-22 ENCOUNTER — Emergency Department (HOSPITAL_COMMUNITY)
Admission: EM | Admit: 2017-02-22 | Discharge: 2017-02-23 | Disposition: A | Discharge status: Home / Self Care | Payer: Medicare HMO | Attending: Emergency Medicine | Admitting: Emergency Medicine

## 2017-02-22 DIAGNOSIS — Z79899 Other long term (current) drug therapy: Secondary | ICD-10-CM | POA: Diagnosis not present

## 2017-02-22 DIAGNOSIS — M25562 Pain in left knee: Secondary | ICD-10-CM | POA: Diagnosis present

## 2017-02-22 DIAGNOSIS — Z87891 Personal history of nicotine dependence: Secondary | ICD-10-CM | POA: Diagnosis not present

## 2017-02-22 DIAGNOSIS — M5432 Sciatica, left side: Secondary | ICD-10-CM | POA: Diagnosis not present

## 2017-02-22 DIAGNOSIS — M5442 Lumbago with sciatica, left side: Secondary | ICD-10-CM | POA: Diagnosis not present

## 2017-02-22 NOTE — ED Triage Notes (Signed)
Pt is c/o sciatic pain in his left buttock, hip, and left leg  Pt states he has back problems and this pain has gotten worse today  Pt was seen by his dr and given Tizanidine HCL 2mg  q 8 hrs as needed yesterday  Pt states he has taken a total of three tablets without any relief  Pt states he is unable to walk due to the pain

## 2017-02-23 DIAGNOSIS — M5432 Sciatica, left side: Secondary | ICD-10-CM | POA: Diagnosis not present

## 2017-02-23 MED ORDER — OXYCODONE-ACETAMINOPHEN 5-325 MG PO TABS
2.0000 | ORAL_TABLET | Freq: Once | ORAL | Status: AC
  Administered 2017-02-23: 2 via ORAL
  Filled 2017-02-23: qty 2

## 2017-02-23 MED ORDER — IBUPROFEN 600 MG PO TABS: 600.0000 mg | ORAL_TABLET | Freq: Four times a day (QID) | ORAL | 0 refills | Status: DC | PRN

## 2017-02-23 MED ORDER — IBUPROFEN 200 MG PO TABS
600.0000 mg | ORAL_TABLET | Freq: Once | ORAL | Status: AC
  Administered 2017-02-23: 600 mg via ORAL
  Filled 2017-02-23: qty 3

## 2017-02-23 MED ORDER — OXYCODONE-ACETAMINOPHEN 5-325 MG PO TABS: 1.0000 | ORAL_TABLET | ORAL | 0 refills | Status: DC | PRN

## 2017-02-23 NOTE — ED Provider Notes (Signed)
[facility] DEPT Provider Note   CSN: [id number] Arrival date & time: 02/22/17  2242     History   Chief Complaint Chief Complaint  Patient presents with  . Sciatica    HPI Steven Vincent is a 51 y.o. male.  Patient with a history of sciatica presents with severe pain in left buttock traveling to posterior left knee consistent with sciatic pain. He reports increasing pain since shoveling snow recently that is severe now. No urinary/bowel incontinence. No numbness. He denies abdominal pain, fever, groin pain or flank pain. He was given Zanaflex at Urgent Care but reports no improvement.    The history is provided by the patient. No language interpreter was used.    Past Medical History:  Diagnosis Date  . Arthritis   . Baker's cyst   . Headache(784.0)   . Knee pain   . Low back pain   . Migraines   . Personality disorder ([place])   . Seizures [facility])     Patient Active Problem List   Diagnosis Date Noted  . Syncope 01/02/2017  . PVC (premature ventricular contraction) 01/02/2017  . Class 1 obesity with body mass index (BMI) of 30.0 to 30.9 in adult 11/13/2016  . Chest pain, unspecified 10/16/2016  . Hyperlipidemia 10/16/2016  . Radicular pain of left lower extremity 01/23/2016  . RLS (restless legs syndrome) 01/23/2016  . Hemiballismus 01/17/2015  . Migraine without aura, without mention of intractable migraine without mention of status migrainosus 01/19/2013  . Migraine 08/03/2010  . Hip pain, left 04/26/2010  . Hemorrhoids, internal, with bleeding 04/26/2010  . Daytime hypersomnolence 11/14/2009  . REACTIVE HYPOGLYCEMIA 04/13/2008  . WEIGHT GAIN 04/13/2008  . SCHIZOTYPAL PERSONALITY DISORDER 07/07/2007  . Low back pain with radiation 11/06/2006  . Convulsions ([place]) 11/06/2006  . Headache, unspecified headache type 11/06/2006    Past Surgical History:  Procedure Laterality Date  . TOOTH EXTRACTION  12/2014   lower left        Home Medications    Prior to Admission medications   Medication Sig Start Date End Date Taking? Authorizing Provider  diclofenac sodium (VOLTAREN) 1 % GEL Apply topically as directed.    [provider]  metoprolol tartrate (LOPRESSOR) 25 MG tablet Take 1 tablet (25 mg total) by mouth 2 (two) times daily. 01/14/17 04/14/17  Minus Breeding, MD  topiramate (TOPAMAX) 25 MG tablet Take 25 mg by mouth 2 (two) times daily.    [provider]    Family History Family History  Problem Relation Age of Onset  . Heart disease Father        defibrilator  . Hyperlipidemia Father   . Hypertension Father   . Alzheimer's disease Father   . Other Father        stents in legs  . Aneurysm Mother        brain  . Hypertension Brother   . Stroke Brother        X2    Social History Social History   Tobacco Use  . Smoking status: Former Smoker    Last attempt to quit: 04/26/1990    Years since quitting: 26.8  . Smokeless tobacco: Never Used  . Tobacco comment: stopped in 1992  Substance Use Topics  . Alcohol use: No  . Drug use: No     Allergies   Aspirin   Review of Systems Review of Systems  Constitutional: Negative for chills and fever.  Gastrointestinal: Negative.  Negative for abdominal  pain and nausea.  Genitourinary: Negative.  Negative for enuresis and flank pain.  Musculoskeletal:       See HPI  Skin: Negative.   Neurological: Negative.  Negative for numbness.     Physical Exam Updated Vital Signs BP 132/76 (BP Location: Left Arm)   Pulse 69   Temp 97.9 F (36.6 C) (Oral)   Resp (!) 22   SpO2 98%   Physical Exam  Constitutional: He is oriented to person, place, and time. He appears well-developed and well-nourished.  Neck: Normal range of motion.  Cardiovascular: Intact distal pulses.  Pulmonary/Chest: Effort normal.  Musculoskeletal: Normal range of motion.  Mild left lumbar tenderness. Significant left buttock tenderness that  reproduces radicular pain to knee.   Neurological: He is alert and oriented to person, place, and time. He displays normal reflexes. No sensory deficit.  Skin: Skin is warm and dry.  Psychiatric: He has a normal mood and affect.     ED Treatments / Results  Labs (all labs ordered are listed, but only abnormal results are displayed) Labs Reviewed - No data to display  EKG  EKG Interpretation None       Radiology No results found.  Procedures Procedures (including critical care time)  Medications Ordered in ED Medications  oxyCODONE-acetaminophen (PERCOCET/ROXICET) 5-325 MG per tablet 2 tablet (not administered)  ibuprofen (ADVIL,MOTRIN) tablet 600 mg (not administered)     Initial Impression / Assessment and Plan / ED Course  I have reviewed the triage vital signs and the nursing notes.  Pertinent labs & imaging results that were available during my care of the patient were reviewed by me and considered in my medical decision making (see chart for details).     Patient with pain c/w left sciatica without neurologic deficits. Will manage with limited # Percocet, encourage ibuprofen and continuation of Zanaflex.  Final Clinical Impressions(s) / ED Diagnoses   Final diagnoses:  None   1. Left sciatica  ED Discharge Orders    None       Dennie Bible 02/23/17 Alvina Chou, MD 02/23/17 [phone]

## 2017-02-23 NOTE — ED Notes (Signed)
Patient states his left back down to his left hip is pain. He hurt it shoveling snow and it has not stopped hurting

## 2017-02-24 ENCOUNTER — Other Ambulatory Visit: Payer: Self-pay | Admitting: Family Medicine

## 2017-02-24 ENCOUNTER — Ambulatory Visit: Payer: Medicare HMO | Admitting: Physical Therapy

## 2017-02-24 DIAGNOSIS — M6281 Muscle weakness (generalized): Secondary | ICD-10-CM

## 2017-02-24 DIAGNOSIS — G8929 Other chronic pain: Secondary | ICD-10-CM

## 2017-02-24 DIAGNOSIS — M545 Low back pain: Secondary | ICD-10-CM | POA: Diagnosis not present
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
## 2017-02-27 ENCOUNTER — Encounter: Payer: Self-pay | Admitting: Physical Therapy

## 2017-02-27 ENCOUNTER — Ambulatory Visit: Payer: Medicare HMO | Attending: Orthopaedic Surgery | Admitting: Physical Therapy

## 2017-02-27 DIAGNOSIS — G8929 Other chronic pain: Secondary | ICD-10-CM | POA: Diagnosis not present

## 2017-02-27 DIAGNOSIS — M545 Low back pain: Secondary | ICD-10-CM | POA: Diagnosis not present

## 2017-02-27 DIAGNOSIS — M6281 Muscle weakness (generalized): Secondary | ICD-10-CM | POA: Insufficient documentation

## 2017-02-27 NOTE — Patient Instructions (Signed)
     Trigger Point Dry Needling  . What is Trigger Point Dry Needling (DN)? o DN is a physical therapy technique used to treat muscle pain and dysfunction. Specifically, DN helps deactivate muscle trigger points (muscle knots).  o A thin filiform needle is used to penetrate the skin and stimulate the underlying trigger point. The goal is for a local twitch response (LTR) to occur and for the trigger point to relax. No medication of any kind is injected during the procedure.   . What Does Trigger Point Dry Needling Feel Like?  o The procedure feels different for each individual patient. Some patients report that they do not actually feel the needle enter the skin and overall the process is not painful. Very mild bleeding may occur. However, many patients feel a deep cramping in the muscle in which the needle was inserted. This is the local twitch response.   . How Will I feel after the treatment? o Soreness is normal, and the onset of soreness may not occur for a few hours. Typically this soreness does not last longer than two days.  o Bruising is uncommon, however; ice can be used to decrease any possible bruising.  o In rare cases feeling tired or nauseous after the treatment is normal. In addition, your symptoms may get worse before they get better, this period will typically not last longer than 24 hours.   . What Can I do After My Treatment? o Increase your hydration by drinking more water for the next 24 hours. o You may place ice or heat on the areas treated that have become sore, however, do not use heat on inflamed or bruised areas. Heat often brings more relief post needling. o You can continue your regular activities, but vigorous activity is not recommended initially after the treatment for 24 hours. o DN is best combined with other physical therapy such as strengthening, stretching, and other therapies.    Sylina Henion PT [facility] [address], [address] [place],  [address] Phone # [phone] Fax [phone] 
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
## 2017-02-28 DIAGNOSIS — M545 Low back pain: Secondary | ICD-10-CM | POA: Diagnosis not present

## 2017-02-28 DIAGNOSIS — M5441 Lumbago with sciatica, right side: Secondary | ICD-10-CM | POA: Diagnosis not present

## 2017-03-03 ENCOUNTER — Telehealth: Payer: Self-pay | Admitting: Pulmonary Disease

## 2017-03-03 ENCOUNTER — Encounter: Payer: Self-pay | Admitting: Physical Therapy

## 2017-03-03 ENCOUNTER — Ambulatory Visit: Payer: Medicare HMO | Admitting: Physical Therapy

## 2017-03-03 DIAGNOSIS — M6281 Muscle weakness (generalized): Secondary | ICD-10-CM | POA: Diagnosis not present

## 2017-03-03 DIAGNOSIS — G8929 Other chronic pain: Secondary | ICD-10-CM

## 2017-03-03 DIAGNOSIS — M545 Low back pain: Secondary | ICD-10-CM | POA: Diagnosis not present

## 2017-03-03 DIAGNOSIS — F2089 Other schizophrenia: Secondary | ICD-10-CM | POA: Diagnosis not present

## 2017-03-03 DIAGNOSIS — G4733 Obstructive sleep apnea (adult) (pediatric): Secondary | ICD-10-CM | POA: Diagnosis not present

## 2017-03-03 DIAGNOSIS — F819 Developmental disorder of scholastic skills, unspecified: Secondary | ICD-10-CM | POA: Diagnosis not present
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
## 2017-03-03 NOTE — Procedures (Signed)
   Patient Name: Steven Vincent, Steven Vincent Date: 02/15/2017 Gender: Male D.O.B: Aug 31, 1965 Age (years): 61 Referring Provider: [name] MD, ABSM Height (inches): 29 Interpreting Physician: [name] MD, ABSM Weight (lbs): 230 RPSGT: Lanae Boast BMI: 30 MRN: [id number] Neck Size: 17.50  CLINICAL INFORMATION Sleep Study Type: NPSG  Indication for sleep study: Daytime sleepiness, history of seizures and developmental delay.  He presents for assessment of sleep disordered breathing.  Seizure montage ordered.  Epworth Sleepiness Score: 17  SLEEP STUDY TECHNIQUE As per the AASM Manual for the Scoring of Sleep and Associated Events v2.3 (April 2016) with a hypopnea requiring 4% desaturations.  The channels recorded and monitored were frontal, central and occipital EEG, electrooculogram (EOG), submentalis EMG (chin), nasal and oral airflow, thoracic and abdominal wall motion, anterior tibialis EMG, snore microphone, electrocardiogram, and pulse oximetry.  MEDICATIONS Medications self-administered by patient taken the night of the study : N/A  SLEEP ARCHITECTURE The study was initiated at 10:31:25 PM and ended at 5:24:30 AM.  Sleep onset time was 4.9 minutes and the sleep efficiency was 86.3%. The total sleep time was 356.7 minutes.  Stage REM latency was 112.0 minutes.  The patient spent 6.73% of the night in stage N1 sleep, 68.32% in stage N2 sleep, 0.00% in stage N3 and 24.95% in REM.  Alpha intrusion was absent.  Supine sleep was 63.91%.  RESPIRATORY PARAMETERS The overall apnea/hypopnea index (AHI) was 6.9 per hour. There were 34 total apneas, including 30 obstructive, 4 central and 0 mixed apneas. There were 7 hypopneas and 5 RERAs.  The AHI during Stage REM sleep was 24.3 per hour.  AHI while supine was 10.5 per hour.  The mean oxygen saturation was 96.30%. The minimum SpO2 during sleep was 85.00%.  moderate snoring was noted during this  study.  CARDIAC DATA The 2 lead EKG demonstrated sinus rhythm. The mean heart rate was 46.39 beats per minute. Other EKG findings include: PVCs.  LEG MOVEMENT DATA The total PLMS were 0 with a resulting PLMS index of 0.00. Associated arousal with leg movement index was 0.0 .  IMPRESSIONS - Mild obstructive sleep apnea occurred during this study with an AHI = 6.9/h and SpO2 low of 85%. - No seizure activity or significant limb movements.  DIAGNOSIS - Obstructive Sleep Apnea (327.23 [G47.33 ICD-10])  RECOMMENDATIONS - Additional therapies include weight loss, positional therapy, CPAP, oral appliance, or surgical assessment.  [Electronically signed] 03/03/2017 08:10 AM  [name] MD, ABSM Diplomate, American Board of Sleep Medicine NPI: [id number]

## 2017-03-03 NOTE — Telephone Encounter (Signed)
PSG 02/15/17 >> AHI 6.9, SpO2 low 85%.  Supine AHI 10.5, REM AHI 24.3.   Will have my nurse inform pt that sleep study shows mild sleep apnea.  Please arrange ROV with me to discuss treatment options.

## 2017-03-03 NOTE — Progress Notes (Signed)
Subjective:   Steven Vincent is a 52 y.o. male who presents for an Initial Medicare Annual Wellness Visit.  Last ov acute in September Labs 10/16/2016 Medical hx of seizures;  Hx of schizotypal Personality disorder   Diet Skip breakfast at times  BMI 30   Exercise Walks his dog;  Is a special Olympian   ETOH neg Tobacco Quit in 57 Not eligible for AAA due to age   hdl 34   Health Maintenance Due  Topic Date Due  . HIV Screening  08/06/1980  . COLONOSCOPY  08/07/2015  . TETANUS/TDAP  12/03/2016   Deferred HIV at this time; doesn't know if he has had one  Colonoscopy ; states he had one at the age of 73 Agrees to get the office the report   A Tetanus is recommended every 10 years. Medicare covers a tetanus if you have a cut or wound; otherwise, there may be a charge. If you had not had a tetanus with pertusses, known as the Tdap, you can take this anytime.      Cardiac Risk Factors include: male gender;advanced age (>5men, >63 women)    Objective:    Today's Vitals   03/04/17 0825  BP: 122/80  Pulse: 61  SpO2: 95%  Weight: 234 lb (106.1 kg)  Height: 6\' 2"  (1.88 m)   Body mass index is 30.04 kg/m.  Advanced Directives 03/04/2017 02/22/2017 02/15/2017 02/14/2017 01/16/2015 09/07/2014 08/12/2014  Does Patient Have a Medical Advance Directive? Yes No No No No No No  Would patient like information on creating a medical advance directive? - No - Patient declined No - Patient declined No - Patient declined - No - patient declined information No - patient declined information   States he does know anyone Referred to the [employer] him and gave  Him the address and telephone number to fup   Current Medications (verified) Outpatient Encounter Medications as of 03/04/2017  Medication Sig  . diclofenac sodium (VOLTAREN) 1 % GEL Apply topically as directed.  Marland Kitchen ibuprofen (ADVIL,MOTRIN) 600 MG tablet Take 1 tablet (600 mg total) by  mouth every 6 (six) hours as needed.  . metoprolol tartrate (LOPRESSOR) 25 MG tablet Take 1 tablet (25 mg total) by mouth 2 (two) times daily.  Marland Kitchen topiramate (TOPAMAX) 25 MG tablet Take 25 mg by mouth 2 (two) times daily.  Marland Kitchen oxyCODONE-acetaminophen (PERCOCET/ROXICET) 5-325 MG tablet Take 1-2 tablets by mouth every 4 (four) hours as needed for severe pain. (Patient not taking: Reported on 03/04/2017)  . rOPINIRole (REQUIP) 0.25 MG tablet TAKE 1 TO 2 TABLETS AT BEDTIME (Patient not taking: Reported on 03/04/2017)   No facility-administered encounter medications on file as of 03/04/2017.     Allergies (verified) Aspirin   History: Past Medical History:  Diagnosis Date  . Arthritis   . Baker's cyst   . Headache(784.0)   . Knee pain   . Low back pain   . Migraines   . Personality disorder ([place])   . Seizures ([place])    Past Surgical History:  Procedure Laterality Date  . TOOTH EXTRACTION  12/2014   lower left   Family History  Problem Relation Age of Onset  . Heart disease Father        defibrilator  . Hyperlipidemia Father   . Hypertension Father   . Alzheimer's disease Father   . Other Father        stents in legs  . Aneurysm Mother  brain  . Hypertension Brother   . Stroke Brother        X2   Social History   Socioeconomic History  . Marital status: Divorced    Spouse name: Not on file  . Number of children: Not on file  . Years of education: Not on file  . Highest education level: Not on file  Social Needs  . Financial resource strain: Not on file  . Food insecurity - worry: Not on file  . Food insecurity - inability: Not on file  . Transportation needs - medical: Not on file  . Transportation needs - non-medical: Not on file  Occupational History  . Occupation: disabled  Tobacco Use  . Smoking status: Former Smoker    Last attempt to quit: 04/26/1990    Years since quitting: 26.8  . Smokeless tobacco: Never Used  . Tobacco comment: stopped in 1992; very  smoking   Substance and Sexual Activity  . Alcohol use: No  . Drug use: No  . Sexual activity: Yes  Other Topics Concern  . Not on file  Social History Narrative  . Not on file   Tobacco Counseling Counseling given: Yes Comment: stopped in 1992; very smoking  states he never smoked a lot; very rarely   Clinical Intake:    Activities of Daily Living In your present state of health, do you have any difficulty performing the following activities: 03/04/2017  Hearing? N  Vision? N  Difficulty concentrating or making decisions? (No Data)  Comment can't determine from this assessment today   Walking or climbing stairs? N  Dressing or bathing? N  Doing errands, shopping? N  Preparing Food and eating ? N  Using the Toilet? N  In the past six months, have you accidently leaked urine? N  Do you have problems with loss of bowel control? N  Managing your Medications? N  Managing your Finances? N  Housekeeping or managing your Housekeeping? N  Some recent data might be hidden     Immunizations and Health Maintenance Immunization History  Administered Date(s) Administered  . Influenza Split 12/03/2010  . Influenza Whole 12/04/2006, 11/17/2007, 01/11/2009  . Td 12/04/2006   Health Maintenance Due  Topic Date Due  . HIV Screening  08/06/1980  . COLONOSCOPY  08/07/2015  . TETANUS/TDAP  12/03/2016    Patient Care Team: Martinique, Betty G, MD as PCP - General (Family Medicine) Norma Fredrickson, MD as Consulting Physician (Psychiatry) Minus Breeding, MD as Consulting Physician (Cardiology)  Indicate any recent Medical Services you may have received from other than [facility] providers in the past year (date may be approximate).    Assessment:   This is a routine wellness examination for Steven Vincent.  Hearing/Vision screen Hearing Screening Comments: No hearing issues  Vision Screening Comments: Had vision check x 1 year ago Does not know the name of his doctor  He does have an apt this  year in may   Dietary issues and exercise activities discussed: States he could use food resources and was given addresses of free pantries and other services in town   Goals    . Exercise 150 min/wk Moderate Activity     When you back is resolved, please walk at least 30 minutes x 2 per day       Depression Screen States sometimes he cries and reaches out to God And he paints   PHQ 2/9 Scores 03/04/2017  PHQ - 2 Score 0    Fall Risk Fall Risk  03/04/2017  Falls in the past year? No     Cognitive Function:  Difficult to determine at this assessment Appears to Confabulate stories, but no history.  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 [name] but did not have the number to call back  States he just seen Dr. Garth Bigness ;  States he was dx with intellectual disability and does not have any other issues  Seen his counselor yesterday; Celesta Gentile        Screening Tests Health Maintenance  Topic Date Due  . HIV Screening  08/06/1980  . COLONOSCOPY  08/07/2015  . TETANUS/TDAP  12/03/2016  . INFLUENZA VACCINE  11/13/2017 (Originally 09/25/2016)         Plan:      PCP Notes   Health Maintenance  States he had a colonoscopy and can get the report. Will leave metric in epic as no report of such noted.  Educated on tetanus; Needs tdap; he states he has Medicaid as well but not sure if this is covered. Educated to take at the pharmacy for less out of pocket expense  Discuss psychosocial issues Difficult to get food; needs new housing before March per his report  Illiterate per his report  Brother is in [place]  But he is not his POA  States he has no POA - had someone at legal aid that assisted him but that person is no longer there.  Given information on the [employer]  Spent time discussing the need for someone to make decisions or help him as needed.    Given resources as needed but seemed very familiar with the services available.    Abnormal Screens  BMI 30   Referrals  Asked about his sleep study and was referred to make an apt with Dr. [name] to follow-up as noted in the notes. Given the number to do so..   Given information on HUD to take home to get assistance with a new apt, but states HUD helped him get this apt, but rent is going up. Given number to call and inquire about rent increase and if he needs to move, to get assistance.    Patient concerns; Wants the result of his sleep study And number given to call Dr. [name] and schedule   Nurse Concerns; Stated he could not read or write, so reviewed his instructions with him.  Did not given him further instructions on Health main and falls to decrease confusion.   Next PCP apt 03/17/2017     I have personally reviewed and noted the following in the patient's chart:   . Medical and social history . Use of alcohol, tobacco or illicit drugs  . Current medications and supplements . Functional ability and status . Nutritional status . Physical activity . Advanced directives . List of other physicians . Hospitalizations, surgeries, and ER visits in previous 12 months . Vitals . Screenings to include cognitive, depression, and falls . Referrals and appointments  In addition, I have reviewed and discussed with patient certain preventive protocols, quality metrics, and best practice recommendations. A written personalized care plan for preventive services as well as general preventive health recommendations were provided to patient.     Wynetta Fines, RN   03/04/2017

## 2017-03-04 ENCOUNTER — Telehealth: Payer: Self-pay | Admitting: Pulmonary Disease

## 2017-03-04 ENCOUNTER — Encounter: Payer: Medicare HMO | Admitting: Physical Therapy

## 2017-03-04 ENCOUNTER — Ambulatory Visit: Payer: Medicare HMO | Admitting: Physical Therapy

## 2017-03-04 ENCOUNTER — Ambulatory Visit (INDEPENDENT_AMBULATORY_CARE_PROVIDER_SITE_OTHER): Payer: Medicare HMO

## 2017-03-04 VITALS — BP 122/80 | HR 61 | Ht 74.0 in | Wt 234.0 lb

## 2017-03-04 DIAGNOSIS — Z0000 Encounter for general adult medical examination without abnormal findings: Secondary | ICD-10-CM | POA: Diagnosis not present

## 2017-03-04 NOTE — Patient Instructions (Addendum)
  [name] , Thank you for taking time to come for your Medicare Wellness Visit. I appreciate your ongoing commitment to your health goals. Please review the following plan we discussed and let me know if I can assist you in the future.   The patient reports he had a colonoscopy at 52 yo Please see if note if you can get your report    A Tetanus is recommended every 10 years. Medicare covers a tetanus if you have a cut or wound; otherwise, there may be a charge. If you had not had a tetanus with pertusses, known as the Tdap, you can take this anytime.  Will discuss with Dr. Martinique when you come in    [address], [place] [address] Tel [phone] Fax [phone]   You need to call Dr. [name] to make an apt to fup on your sleep study Phone: [phone]    [place] [facility]; 2281869478 [address]. [place]; ([phone] Get resource to get information on any and all community programs for [place]; "Aging Gracefully In Place" program; can request or apply  [employer]: 614-793-1996 Community Health Response Program -329-924-2683 Public Health Dept; Need to be a skilled visit but can assist with bathing as well; 307-781-8391  Adult center for Enrich[facility];  Call Senior Line; [phone]  Adult day services include Adult Day Care, Adult Day Healthcare, Group Respite, Care Partners, Volunteer [facility], Education and Support Program  Help with Rx at [place]  Monday - Friday 8am to 10pm EST Sat- Sunday 9am to 7pm Patient help line 431-398-0668 Email support online at Gemini[employer].gl  Dept of [place]; Call [phone] and ask for SW on call  Options for Medicaid include the Community Alternatives program; [place]org (personal care services) or PACE program, which is a medical and social program combined  Braulio Conte manages the community Alternatives program at the [employer];  858-850-2774 (this is a program with a waiting list but provides SNF care at home;  [facility] 2 required; Call Claiborne Billings and she will send out packet of information   Caregiver support group and information regarding [place] is at the; [facility] Address: [address] [place], [place] [address]  Phone: [phone]   [employer]pl general resources for food etc    [employer] will fup on hearing analysis  Deaf & Hard of Hearing Division Services - can assist with hearing aid x 1  No reviews  [place]  [address]  [phone]  Resource to find disability equipment (used) online; https://bradley.com/     These are the goals we discussed: Goals    . Exercise 150 min/wk Moderate Activity     When you back is resolved, please walk at least 30 minutes x 2 per day        This is a list of the screening recommended for you and due dates:  Health Maintenance  Topic Date Due  . HIV Screening  08/06/1980  . Colon Cancer Screening  08/07/2015  . Tetanus Vaccine  12/03/2016  . Flu Shot  11/13/2017*  *Topic was postponed. The date shown is not the original due date.

## 2017-03-04 NOTE — Telephone Encounter (Signed)
Spoke with patient. He had several questions about his sleep study. Advised him that per VS, he needs to schedule an appointment to discuss treatment options.   Patient already has an appt for 03/26/17 with VS. He is aware. Nothing else needed at time of call.

## 2017-03-04 NOTE — Telephone Encounter (Signed)
[name], MD    03/03/17 8:13 AM  Note    PSG 02/15/17 >> AHI 6.9, SpO2 low 85%.  Supine AHI 10.5, REM AHI 24.3.   Will have my nurse inform pt that sleep study shows mild sleep apnea.  Please arrange ROV with me to discuss treatment options.     Advised pt of results. Pt understood and nothing further is needed.

## 2017-03-04 NOTE — Telephone Encounter (Signed)
Patient states he is returning a call.  He states he was not given full results of sleep study and only told "a little bit of information" and would like to speak to someone about this, CB is [phone]

## 2017-03-04 NOTE — Telephone Encounter (Signed)
Advised pt of results. Pt understood and nothing further is needed.   Appt made.

## 2017-03-06 ENCOUNTER — Ambulatory Visit: Payer: Medicare HMO | Admitting: Physical Therapy

## 2017-03-06 ENCOUNTER — Encounter: Payer: Self-pay | Admitting: Physical Therapy

## 2017-03-06 DIAGNOSIS — M545 Low back pain: Principal | ICD-10-CM

## 2017-03-06 DIAGNOSIS — M6281 Muscle weakness (generalized): Secondary | ICD-10-CM | POA: Diagnosis not present

## 2017-03-06 DIAGNOSIS — G8929 Other chronic pain: Secondary | ICD-10-CM | POA: Diagnosis not present
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
## 2017-03-07 DIAGNOSIS — M545 Low back pain: Secondary | ICD-10-CM | POA: Diagnosis not present

## 2017-03-10 ENCOUNTER — Telehealth: Payer: Self-pay | Admitting: Family Medicine

## 2017-03-10 DIAGNOSIS — M5441 Lumbago with sciatica, right side: Secondary | ICD-10-CM | POA: Diagnosis not present

## 2017-03-10 NOTE — Telephone Encounter (Signed)
Copied from [employer] [id number]. Topic: Inquiry >> Mar 10, 2017  5:14 PM Cecelia Byars, NT wrote: Reason for CRM: Patient is unsure if he is still taking  ropinirole ,please advise [phone]

## 2017-03-11 ENCOUNTER — Ambulatory Visit: Payer: Medicare HMO | Admitting: Physical Therapy

## 2017-03-11 DIAGNOSIS — M545 Low back pain: Principal | ICD-10-CM

## 2017-03-11 DIAGNOSIS — M6281 Muscle weakness (generalized): Secondary | ICD-10-CM

## 2017-03-11 DIAGNOSIS — G8929 Other chronic pain: Secondary | ICD-10-CM | POA: Diagnosis not present

## 2017-03-11 NOTE — Telephone Encounter (Signed)
Spoke with patient concerning medication. Patient verbalized understanding.
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
## 2017-03-12 ENCOUNTER — Ambulatory Visit: Payer: Self-pay | Admitting: *Deleted

## 2017-03-12 ENCOUNTER — Ambulatory Visit (INDEPENDENT_AMBULATORY_CARE_PROVIDER_SITE_OTHER): Payer: Medicare HMO | Admitting: Family Medicine

## 2017-03-12 ENCOUNTER — Encounter: Payer: Self-pay | Admitting: Family Medicine

## 2017-03-12 VITALS — BP 100/80 | HR 63 | Temp 98.6°F | Wt 235.7 lb

## 2017-03-12 DIAGNOSIS — R059 Cough, unspecified: Secondary | ICD-10-CM

## 2017-03-12 DIAGNOSIS — J40 Bronchitis, not specified as acute or chronic: Secondary | ICD-10-CM

## 2017-03-12 DIAGNOSIS — R05 Cough: Secondary | ICD-10-CM

## 2017-03-12 MED ORDER — AMOXICILLIN 500 MG PO CAPS: 500.0000 mg | ORAL_CAPSULE | Freq: Two times a day (BID) | ORAL | 0 refills | Status: DC

## 2017-03-12 MED ORDER — HYDROCODONE-HOMATROPINE 5-1.5 MG/5ML PO SYRP: 5.0000 mL | ORAL_SOLUTION | Freq: Three times a day (TID) | ORAL | 0 refills | Status: DC | PRN

## 2017-03-12 NOTE — Telephone Encounter (Signed)
Charted in error; see previous encounter

## 2017-03-12 NOTE — Progress Notes (Signed)
Subjective:    Patient ID: Steven Vincent, male    DOB: 06/21/1965, 52 y.o.   MRN: [id number]  Chief Complaint  Patient presents with  . Cough    started 3 weeks ago/ sinus drainage    HPI Patient was seen today for cough, sinus drainage x 3 wks.  Pt states he has not taken anything, as he cannot afford to get anything.  Pt is worried he will not be able to lie flat without coughing for his MRI of back tomorrow.  Past Medical History:  Diagnosis Date  . Arthritis   . Baker's cyst   . Headache(784.0)   . Knee pain   . Low back pain   . Migraines   . Personality disorder ([place])   . Seizures ([facility])     Allergies  Allergen Reactions  . Aspirin     GI upset    ROS General: Denies fever, chills, night sweats, changes in weight, changes in appetite HEENT: Denies headaches, ear pain, changes in vision, rhinorrhea, sore throat    +sinus drainage CV: Denies CP, palpitations, SOB, orthopnea Pulm: Denies SOB, wheezing   +cough GI: Denies abdominal pain, nausea, vomiting, diarrhea, constipation GU: Denies dysuria, hematuria, frequency, vaginal discharge Msk: Denies muscle cramps, joint pains  +back pain Neuro: Denies weakness, numbness, tingling Skin: Denies rashes, bruising Psych: Denies depression, anxiety, hallucinations     Objective:    Blood pressure 100/80, pulse 63, temperature 98.6 F (37 C), temperature source Oral, weight 235 lb 11.2 oz (106.9 kg), SpO2 96 %.   Gen. Pleasant, well-nourished, in no distress, normal affect   HEENT: [place]/AT, face symmetric, conjunctiva clear, no scleral icterus, PERRLA, nares patent without drainage, pharynx with post nasal drainage and mild erythema, no exudate.  TMs full b/l.  No cervical lymphadenopathy. Lungs: occasional cough, no accessory muscle use, CTAB, faint wheezes, no rales Cardiovascular: RRR, no m/r/g, no peripheral edema Abdomen: BS present, soft, NT/ND Neuro:  A&Ox3, CN II-XII intact, normal gait    Wt Readings from  Last 3 Encounters:  03/12/17 235 lb 11.2 oz (106.9 kg)  03/04/17 234 lb (106.1 kg)  02/23/17 230 lb (104.3 kg)    Lab Results  Component Value Date   WBC 7.5 02/21/2016   HGB 14.7 02/21/2016   HCT 42.1 02/21/2016   PLT 162.0 02/21/2016   GLUCOSE 94 10/16/2016   CHOL 134 10/16/2016   TRIG 156.0 (H) 10/16/2016   HDL 28.00 (L) 10/16/2016   LDLDIRECT 70.8 12/17/2007   LDLCALC 74 10/16/2016   ALT 21 02/21/2016   AST 21 02/21/2016   NA 142 10/16/2016   K 4.3 10/16/2016   CL 110 10/16/2016   CREATININE 1.00 10/16/2016   BUN 25 (H) 10/16/2016   CO2 26 10/16/2016   TSH 4.84 (H) 10/16/2016    Assessment/Plan:  Bronchitis  -given cough x 3 wks will treat. - Plan: amoxicillin (AMOXIL) 500 MG capsule  Cough -discussed supportive care -rx for hycodan, pt left without script.  Will have it sent electronically by another provider.    F/u prn   Grier Mitts, MD

## 2017-03-12 NOTE — Patient Instructions (Addendum)

## 2017-03-12 NOTE — Telephone Encounter (Addendum)
Called pt and he states that he has sinus drainage and cough; pt states that he has tried to have MRI twice because of back pain; Pt states that R Daldorf has given him steroids for the back issues; he is requesting something for his cough and sinus drainage because the cough and drainage are bad when he lays down and he has an MRI scheduled for 03/13/17; he further states that he is hesitant to take OTC medications because of his medical history and current medications; nurse triage initiated and recommendation made for pt to see physician within 24 hours; pt offered and accepted appointment with Dr Volanda Napoleon at 1400 today; pt verbalizes understanding.  Reason for Disposition . [1] Continuous (nonstop) coughing interferes with work or school AND [2] no improvement using cough treatment per Care Advice  Answer Assessment - Initial Assessment Questions 1. ONSET: "When did the cough begin?"       30 days ago 2. SEVERITY: "How bad is the cough today?"      Moderate during the day and severe at night especially when laying down 3. RESPIRATORY DISTRESS: "Describe your breathing."      Pulls on his left side hip 4. FEVER: "Do you have a fever?" If so, ask: "What is your temperature, how was it measured, and when did it start?"     no 5. SPUTUM: "Describe the color of your sputum" (clear, white, yellow, green)   Small amount of green sputum  6. HEMOPTYSIS: "Are you coughing up any blood?" If so ask: "How much?" (flecks, streaks, tablespoons, etc.)     no 7. CARDIAC HISTORY: "Do you have any history of heart disease?" (e.g., heart attack, congestive heart failure)      Yes skips beats and takes medication8. LUNG HISTORY: "Do you have any history of lung disease?"  (e.g., pulmonary embolus, asthma, emphysema)     no 9. PE RISK FACTORS: "Do you have a history of blood clots?" (or: recent major surgery, recent prolonged travel, bedridden )     no 10. OTHER SYMPTOMS: "Do you have any other symptoms?" (e.g.,  runny nose, wheezing, chest pain)       Runny nose 11. PREGNANCY: "Is there any chance you are pregnant?" "When was your last menstrual period?"       n/a 12. TRAVEL: "Have you traveled out of the country in the last month?" (e.g., travel history, exposures)       no  Protocols used: [place]

## 2017-03-13 ENCOUNTER — Other Ambulatory Visit: Payer: Self-pay | Admitting: Adult Health

## 2017-03-13 ENCOUNTER — Ambulatory Visit: Payer: Medicare HMO | Admitting: Physical Therapy

## 2017-03-13 DIAGNOSIS — R05 Cough: Secondary | ICD-10-CM

## 2017-03-13 DIAGNOSIS — G8929 Other chronic pain: Secondary | ICD-10-CM

## 2017-03-13 DIAGNOSIS — R059 Cough, unspecified: Secondary | ICD-10-CM

## 2017-03-13 DIAGNOSIS — M6281 Muscle weakness (generalized): Secondary | ICD-10-CM | POA: Diagnosis not present

## 2017-03-13 DIAGNOSIS — M545 Low back pain: Secondary | ICD-10-CM | POA: Diagnosis not present

## 2017-03-13 MED ORDER — HYDROCODONE-HOMATROPINE 5-1.5 MG/5ML PO SYRP: 5.0000 mL | ORAL_SOLUTION | Freq: Three times a day (TID) | ORAL | 0 refills | Status: DC | PRN
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
## 2017-03-13 NOTE — Progress Notes (Signed)
I have reviewed documentation from this visit and I agree with recommendations given.  Betty G. Jordan, MD  [facility]. [facility].   

## 2017-03-13 NOTE — Progress Notes (Signed)
Encounter to send in Hycodan cough syrup electronically, PCP that he was seen by today unable to do so

## 2017-03-14 ENCOUNTER — Ambulatory Visit: Payer: Medicare HMO | Admitting: Cardiology

## 2017-03-14 DIAGNOSIS — M5116 Intervertebral disc disorders with radiculopathy, lumbar region: Secondary | ICD-10-CM | POA: Diagnosis not present

## 2017-03-17 ENCOUNTER — Ambulatory Visit (INDEPENDENT_AMBULATORY_CARE_PROVIDER_SITE_OTHER): Payer: Medicare HMO | Admitting: Family Medicine

## 2017-03-17 ENCOUNTER — Encounter: Payer: Self-pay | Admitting: Family Medicine

## 2017-03-17 VITALS — BP 90/70 | HR 46 | Temp 98.1°F | Ht 74.0 in | Wt 231.2 lb

## 2017-03-17 DIAGNOSIS — E049 Nontoxic goiter, unspecified: Secondary | ICD-10-CM

## 2017-03-17 DIAGNOSIS — R001 Bradycardia, unspecified: Secondary | ICD-10-CM | POA: Diagnosis not present

## 2017-03-17 DIAGNOSIS — R7989 Other specified abnormal findings of blood chemistry: Secondary | ICD-10-CM | POA: Diagnosis not present

## 2017-03-17 DIAGNOSIS — I493 Ventricular premature depolarization: Secondary | ICD-10-CM

## 2017-03-17 LAB — TSH: TSH: 2.51 u[IU]/mL (ref 0.35–4.50)

## 2017-03-17 LAB — T4, FREE: Free T4: 1.07 ng/dL (ref 0.60–1.60)

## 2017-03-17 LAB — T3, FREE: T3, Free: 3.1 pg/mL (ref 2.3–4.2)

## 2017-03-17 MED ORDER — METOPROLOL TARTRATE 25 MG PO TABS: 12.5000 mg | ORAL_TABLET | Freq: Two times a day (BID) | ORAL | 1 refills | Status: DC

## 2017-03-17 NOTE — Patient Instructions (Addendum)
A few things to remember from today's visit:   Abnormal TSH - Plan: T4, free, TSH, T3, free, US THYROID  Enlarged thyroid gland - Plan: US THYROID  Sinus bradycardia  Decreased Metoprolol from 1 tab to 1/2 tab 2 times daily.  Keep appt with cardiologist in 04/2017.  Please be sure medication list is accurate. If a new problem present, please set up appointment sooner than planned today.

## 2017-03-17 NOTE — Progress Notes (Signed)
HPI:   [name] is a 52 y.o. male, who is here today for 4 months follow up.   He was last seen on 11/13/16  Since his last OV he has followed with neuro,ortho,and cardiologist. He was also seen for acute visit on 03/12/14, Dx with acute bronchitis.  He is still coughing but in general symptoms have improved.   He was treated with oral abx.  Palpitations have improved greatly after he started taking Metoprolol Titrate 25 mg twice daily. He has not had chest pain or dyspnea. Occasional dizziness, mainly when he gets up rapidly. Today BP mildly low, he is not checking BP at home.  He also has his sleep study and has appointment with Dr. [name] in a few days to go through results.  Back pain, getting worse.  States that he can not lie down at all due to pain. According to pt, he is having epidural under anesthesia and then surgery in 03/2017. He denies saddle anesthesia or urine/bowel incontinence. Pain is severe and exacerbated by movement. Alleviated by rest.    Abnormal TSH: He denies cold/intolerance, diarrhea/constipation, or abnormal weight loss. Stable fatigue.  TSH 4.84 on 10/16/16.   Review of Systems  Constitutional: Positive for fatigue. Negative for activity change, appetite change and fever.  HENT: Negative for nosebleeds, sore throat and trouble swallowing.   Eyes: Negative for redness and visual disturbance.  Respiratory: Negative for cough, shortness of breath and wheezing.   Cardiovascular: Negative for chest pain, palpitations and leg swelling.  Gastrointestinal: Negative for abdominal pain, nausea and vomiting.  Endocrine: Negative for cold intolerance and heat intolerance.  Genitourinary: Negative for decreased urine volume, dysuria and hematuria.  Musculoskeletal: Positive for back pain and gait problem.  Neurological: Negative for syncope, weakness and headaches.  Psychiatric/Behavioral: Negative for confusion. The patient is  nervous/anxious.     Current Outpatient Medications on File Prior to Visit  Medication Sig Dispense Refill  . diclofenac sodium (VOLTAREN) 1 % GEL Apply topically as directed.    Marland Kitchen rOPINIRole (REQUIP) 0.25 MG tablet TAKE 1 TO 2 TABLETS AT BEDTIME 180 tablet 1  . topiramate (TOPAMAX) 25 MG tablet Take 25 mg by mouth 2 (two) times daily.     No current facility-administered medications on file prior to visit.      Past Medical History:  Diagnosis Date  . Arthritis   . Baker's cyst   . Headache(784.0)   . Knee pain   . Low back pain   . Migraines   . Personality disorder ([place])   . Seizures ([facility])    Allergies  Allergen Reactions  . Aspirin     GI upset    Social History   Socioeconomic History  . Marital status: Divorced    Spouse name: None  . Number of children: None  . Years of education: None  . Highest education level: None  Social Needs  . Financial resource strain: None  . Food insecurity - worry: None  . Food insecurity - inability: None  . Transportation needs - medical: None  . Transportation needs - non-medical: None  Occupational History  . Occupation: disabled  Tobacco Use  . Smoking status: Former Smoker    Last attempt to quit: 04/26/1990    Years since quitting: 26.9  . Smokeless tobacco: Never Used  . Tobacco comment: stopped in 1992; very smoking   Substance and Sexual Activity  . Alcohol use: No  . Drug use: No  .  Sexual activity: Yes  Other Topics Concern  . None  Social History Narrative  . None    Vitals:   03/17/17 1028 03/17/17 1050  BP: 90/70   Pulse: 61 (!) 46  Temp: 98.1 F (36.7 C)   SpO2: 97%    Body mass index is 29.68 kg/m.   Physical Exam  Nursing note and vitals reviewed. Constitutional: He is oriented to person, place, and time. He appears well-developed. No distress.  HENT:  Head: Normocephalic and atraumatic.  Mouth/Throat: Oropharynx is clear and moist and mucous membranes are normal.  Eyes: Conjunctivae  are normal. Pupils are equal, round, and reactive to light.  Neck: No tracheal deviation present. Thyromegaly present. No thyroid mass present.  Cardiovascular: An irregular rhythm present. Bradycardia present.  No murmur heard. Pulses:      Dorsalis pedis pulses are 2+ on the right side, and 2+ on the left side.  Respiratory: Effort normal and breath sounds normal. No respiratory distress.  GI: Soft. He exhibits no mass. There is no tenderness.  Musculoskeletal: He exhibits no edema.       Lumbar back: He exhibits no tenderness and no bony tenderness.  Lymphadenopathy:    He has no cervical adenopathy.  Neurological: He is alert and oriented to person, place, and time. He has normal strength.  Antalgic gair assisted with a cane.  Skin: Skin is warm. No erythema.  Psychiatric: His mood appears anxious. Cognition and memory are normal.  Fairly groomed, good eye contact.    ASSESSMENT AND PLAN:   [name] was seen today for 4 months follow-up.  Diagnoses and all orders for this visit:  Lab Results  Component Value Date   TSH 2.51 03/17/2017    Abnormal TSH  Mildly abnormal in the past. Further recommendations will be given according to lab results.  -     T4, free -     TSH -     T3, free -     US THYROID; Future  Enlarged thyroid gland  ? Right nodule.  Further recommendations will be given according to imaging results.  -     US THYROID; Future  Sinus bradycardia  With mild hypotension.   Asymptomatic.   Educated about diagnosis, possible etiologies, and warning signs. Metoprolol Titrate decreased from 25 mg twice daily to 12.5 mg twice daily. I try to teach him how to monitor heart rate but he is unable to do, so nurse visit in 3-4 weeks to check BP and pulse.  PVC (premature ventricular contraction)   Still present on auscultation but he is asymptomatic since he started Metoprolol tartrate. We discussed some side effects of  beta-blocker. He agrees with decreasing dose of Metoprolol. He will monitor for worsening symptoms.   We might need to consider Diltiazem. Instructed to keep next appointment with cardiologist, 04/2017.  -     metoprolol tartrate (LOPRESSOR) 25 MG tablet; Take 0.5 tablets (12.5 mg total) by mouth 2 (two) times daily.     -[name] was advised to return sooner than planned today if new concerns arise.       Graiden Henes G. Martinique, MD  [facility]. [place] office.

## 2017-03-18 ENCOUNTER — Ambulatory Visit: Payer: Medicare HMO | Admitting: Physical Therapy

## 2017-03-18 ENCOUNTER — Telehealth: Payer: Self-pay | Admitting: Physical Therapy

## 2017-03-18 DIAGNOSIS — F819 Developmental disorder of scholastic skills, unspecified: Secondary | ICD-10-CM | POA: Diagnosis not present

## 2017-03-18 NOTE — Telephone Encounter (Signed)
No show for appt.  Left message.  Patient previously called to report he was having back surgery.

## 2017-03-20 ENCOUNTER — Encounter: Payer: Medicare HMO | Admitting: Physical Therapy

## 2017-03-21 ENCOUNTER — Ambulatory Visit
Admission: RE | Admit: 2017-03-21 | Discharge: 2017-03-21 | Disposition: A | Discharge status: Home / Self Care | Payer: Medicare HMO | Source: Ambulatory Visit | Attending: Family Medicine | Admitting: Family Medicine

## 2017-03-21 DIAGNOSIS — E049 Nontoxic goiter, unspecified: Secondary | ICD-10-CM

## 2017-03-21 DIAGNOSIS — E041 Nontoxic single thyroid nodule: Secondary | ICD-10-CM | POA: Diagnosis not present

## 2017-03-21 DIAGNOSIS — R7989 Other specified abnormal findings of blood chemistry: Secondary | ICD-10-CM

## 2017-03-24 DIAGNOSIS — M5136 Other intervertebral disc degeneration, lumbar region: Secondary | ICD-10-CM | POA: Diagnosis not present

## 2017-03-26 ENCOUNTER — Ambulatory Visit: Payer: Medicare HMO | Admitting: Pulmonary Disease

## 2017-03-26 ENCOUNTER — Encounter: Payer: Self-pay | Admitting: Family Medicine

## 2017-03-27 DIAGNOSIS — M5136 Other intervertebral disc degeneration, lumbar region: Secondary | ICD-10-CM | POA: Diagnosis not present

## 2017-03-31 DIAGNOSIS — F819 Developmental disorder of scholastic skills, unspecified: Secondary | ICD-10-CM | POA: Diagnosis not present

## 2017-03-31 DIAGNOSIS — M5116 Intervertebral disc disorders with radiculopathy, lumbar region: Secondary | ICD-10-CM | POA: Diagnosis not present

## 2017-04-01 DIAGNOSIS — F2089 Other schizophrenia: Secondary | ICD-10-CM | POA: Diagnosis not present

## 2017-04-07 DIAGNOSIS — M5416 Radiculopathy, lumbar region: Secondary | ICD-10-CM | POA: Diagnosis not present

## 2017-04-08 DIAGNOSIS — F819 Developmental disorder of scholastic skills, unspecified: Secondary | ICD-10-CM | POA: Diagnosis not present

## 2017-04-10 ENCOUNTER — Ambulatory Visit (INDEPENDENT_AMBULATORY_CARE_PROVIDER_SITE_OTHER): Payer: Medicare HMO | Admitting: Pulmonary Disease

## 2017-04-10 ENCOUNTER — Encounter: Payer: Self-pay | Admitting: Pulmonary Disease

## 2017-04-10 VITALS — BP 110/80 | HR 47 | Ht 74.0 in | Wt 237.4 lb

## 2017-04-10 DIAGNOSIS — G4733 Obstructive sleep apnea (adult) (pediatric): Secondary | ICD-10-CM

## 2017-04-10 HISTORY — DX: Obstructive sleep apnea (adult) (pediatric): G47.33

## 2017-04-10 NOTE — Patient Instructions (Signed)
Will arrange for auto CPAP set up  Follow up in 2 months with Dr. Dave Mannes or Nurse Practitioner 

## 2017-04-10 NOTE — Progress Notes (Signed)
[facility]  Chief Complaint  Patient presents with  . Follow-up    Pt is here for sleep study results    Vital signs: BP 110/80 (BP Location: Left Arm, Cuff Size: Normal)   Pulse (!) 47   Ht 6\' 2"  (1.88 m)   Wt 237 lb 6.4 oz (107.7 kg)   SpO2 99%   BMI 30.48 kg/m   History of Present Illness: Steven Vincent is a 52 y.o. male obstructive sleep apnea.  He is here to review his sleep study.  This showed mild sleep apnea, but moderate in REM sleep.  Physical Exam:  General - pleasant Eyes - pupils reactive ENT - no sinus tenderness, no oral exudate, no LAN, poor dentition Cardiac - regular, no murmur Chest - no wheeze, rales Abd - soft, non tender Ext - no edema Skin - no rashes Neuro - normal strength Psych - normal mood  Assessment/Plan:  Obstructive sleep apnea. - We discussed how sleep apnea can affect various health problems, including risks for hypertension, cardiovascular disease, and diabetes.  We also discussed how sleep disruption can increase risks for accidents, such as while driving.  Weight loss as a means of improving sleep apnea was also reviewed.  Additional treatment options discussed were CPAP therapy, oral appliance, and surgical intervention. - will arrange for auto CPAP set up   Patient Instructions  Will arrange for auto CPAP set up  Follow up in 2 months with Dr. [name] or Nurse Practitioner    [name], MD [place] 04/10/2017, 9:24 AM Pager:  [phone]  Flow Sheet  Sleep tests: PSG 02/15/17 >> AHI 6.9, SpO2 low 85%.  Supine AHI 10.5, REM AHI 24.3.  Past Medical History: He  has a past medical history of Arthritis, Baker's cyst, Headache(784.0), Knee pain, Low back pain, Migraines, OSA (obstructive sleep apnea) (04/10/2017), Personality disorder ([place]), and Seizures ([place]).  Past Surgical History: He  has a past surgical history that includes Tooth extraction  (12/2014).  Family History: His family history includes Alzheimer's disease in his father; Aneurysm in his mother; Heart disease in his father; Hyperlipidemia in his father; Hypertension in his brother and father; Other in his father; Stroke in his brother.  Social History: He  reports that he quit smoking about 26 years ago. he has never used smokeless tobacco. He reports that he does not drink alcohol or use drugs.  Medications: Allergies as of 04/10/2017      Reactions   Aspirin    GI upset      Medication List        Accurate as of 04/10/17  9:24 AM. Always use your most recent med list.          diclofenac sodium 1 % Gel Commonly known as:  VOLTAREN Apply topically as directed.   metoprolol tartrate 25 MG tablet Commonly known as:  LOPRESSOR Take 0.5 tablets (12.5 mg total) by mouth 2 (two) times daily.   rOPINIRole 0.25 MG tablet Commonly known as:  REQUIP TAKE 1 TO 2 TABLETS AT BEDTIME   topiramate 25 MG tablet Commonly known as:  TOPAMAX Take 25 mg by mouth 2 (two) times daily.

## 2017-04-14 ENCOUNTER — Ambulatory Visit (INDEPENDENT_AMBULATORY_CARE_PROVIDER_SITE_OTHER): Payer: Medicare HMO | Admitting: Cardiology

## 2017-04-14 ENCOUNTER — Ambulatory Visit: Payer: Medicare HMO | Admitting: Pulmonary Disease

## 2017-04-14 ENCOUNTER — Encounter: Payer: Self-pay | Admitting: Cardiology

## 2017-04-14 VITALS — BP 128/82 | HR 46 | Ht 74.0 in | Wt 237.0 lb

## 2017-04-14 DIAGNOSIS — I493 Ventricular premature depolarization: Secondary | ICD-10-CM | POA: Diagnosis not present

## 2017-04-14 DIAGNOSIS — R001 Bradycardia, unspecified: Secondary | ICD-10-CM

## 2017-04-14 DIAGNOSIS — M5440 Lumbago with sciatica, unspecified side: Secondary | ICD-10-CM

## 2017-04-14 DIAGNOSIS — G4733 Obstructive sleep apnea (adult) (pediatric): Secondary | ICD-10-CM | POA: Diagnosis not present

## 2017-04-14 DIAGNOSIS — M545 Low back pain, unspecified: Secondary | ICD-10-CM

## 2017-04-14 NOTE — Assessment & Plan Note (Signed)
Frequent PVCs

## 2017-04-14 NOTE — Assessment & Plan Note (Signed)
He has significant DJD of his back and will be getting an injection later next month

## 2017-04-14 NOTE — Assessment & Plan Note (Signed)
I suspect this is secondary to PVCs

## 2017-04-14 NOTE — Assessment & Plan Note (Signed)
He is to start C-pap

## 2017-04-14 NOTE — Progress Notes (Signed)
04/14/2017 Steven Vincent   Oct 07, 1965  [id number]  Primary Physician Martinique, Betty G, MD Primary Cardiologist: Dr Percival Spanish  HPI:  52 y.o. male, a recognized special olypmian, followed by Dr Percival Spanish with a history of questionable syncope.   The events surrounding this did not confirm that this was in fact syncope.  He had a negative POET (Plain Old Exercise Treadmill) in Nov 2018 .  He had a Holter with PVCs with bigeminy, trigeminy and a triplet.  Echo done  Nov 208 was essential normal.  He recently had a sleep study which was positive for sleep apnea and he is to start his class for C-pap tomorrow. He is in the office today because he was told his HR was "too slow" -in the 40's.  His PCP cut his Lopressor back to 6.25 mg BID. He has not had any syncope but has intermittent "dizzyness".  He does not really notice any palpitations, presyncope or syncope. His EKG in the office shows NSR with frequent PVCs and a couplet. I suspect his "low heart rate" was secondary to PVCs.     Current Outpatient Medications  Medication Sig Dispense Refill  . diclofenac sodium (VOLTAREN) 1 % GEL Apply topically as directed.    . metoprolol tartrate (LOPRESSOR) 25 MG tablet Take 6.25 mg by mouth 2 (two) times daily.    Marland Kitchen rOPINIRole (REQUIP) 0.25 MG tablet TAKE 1 TO 2 TABLETS AT BEDTIME 180 tablet 1  . topiramate (TOPAMAX) 25 MG tablet Take 25 mg by mouth 2 (two) times daily.     No current facility-administered medications for this visit.     Allergies  Allergen Reactions  . Aspirin     GI upset    Past Medical History:  Diagnosis Date  . Arthritis   . Baker's cyst   . Headache(784.0)   . Knee pain   . Low back pain   . Migraines   . OSA (obstructive sleep apnea) 04/10/2017  . Personality disorder ([place])   . Seizures ([place])     Social History   Socioeconomic History  . Marital status: Divorced    Spouse name: Not on file  . Number of children: Not on file  . Years of education: Not  on file  . Highest education level: Not on file  Social Needs  . Financial resource strain: Not on file  . Food insecurity - worry: Not on file  . Food insecurity - inability: Not on file  . Transportation needs - medical: Not on file  . Transportation needs - non-medical: Not on file  Occupational History  . Occupation: disabled  Tobacco Use  . Smoking status: Former Smoker    Last attempt to quit: 04/26/1990    Years since quitting: 26.9  . Smokeless tobacco: Never Used  . Tobacco comment: stopped in 1992; very smoking   Substance and Sexual Activity  . Alcohol use: No  . Drug use: No  . Sexual activity: Yes  Other Topics Concern  . Not on file  Social History Narrative  . Not on file     Family History  Problem Relation Age of Onset  . Heart disease Father        defibrilator  . Hyperlipidemia Father   . Hypertension Father   . Alzheimer's disease Father   . Other Father        stents in legs  . Aneurysm Mother        brain  . Hypertension Brother   .  Stroke Brother        X2     Review of Systems: General: negative for chills, fever, night sweats or weight changes.  Cardiovascular: negative for chest pain, dyspnea on exertion, edema, orthopnea, palpitations, paroxysmal nocturnal dyspnea or shortness of breath Dermatological: negative for rash Respiratory: negative for cough or wheezing Urologic: negative for hematuria Abdominal: negative for nausea, vomiting, diarrhea, bright red blood per rectum, melena, or hematemesis Neurologic: negative for visual changes, syncope, or dizziness All other systems reviewed and are otherwise negative except as noted above.    Blood pressure 128/82, pulse (!) 46, height 6\' 2"  (1.88 m), weight 237 lb (107.5 kg).  General appearance: alert, cooperative and no distress Neck: no carotid bruit, no JVD and thick neck Lungs: clear to auscultation bilaterally Heart: regular rate and rhythm Extremities: extremities normal,  atraumatic, no cyanosis or edema Skin: Skin color, texture, turgor normal. No rashes or lesions Neurologic: Grossly normal  EKG NSR, PVCs, couplets  ASSESSMENT AND PLAN:   Bradycardia I suspect this is secondary to PVCs  OSA (obstructive sleep apnea) He is to start C-pap  PVC (premature ventricular contraction) Frequent PVCs  Low back pain with radiation He has significant DJD of his back and will be getting an injection later next month   PLAN  I think his sleep apnea may be contributing to his frequent ectopy.  Avoid stimulants. Follow through with C-pap. F/U in one month.   Kerin Ransom PA-C 04/14/2017 3:45 PM

## 2017-04-14 NOTE — Patient Instructions (Signed)
Medication Instructions:  Continue current medications  If you need a refill on your cardiac medications before your next appointment, please call your pharmacy.  Labwork: None Ordered   Testing/Procedures: None Ordered  Follow-Up: Your physician wants you to follow-up in: 4-6 weeks with Dr Percival Spanish.    Thank you for choosing [facility] at [facility]!!

## 2017-04-18 DIAGNOSIS — G4733 Obstructive sleep apnea (adult) (pediatric): Secondary | ICD-10-CM | POA: Diagnosis not present

## 2017-04-21 ENCOUNTER — Telehealth: Payer: Self-pay | Admitting: Neurology

## 2017-04-21 DIAGNOSIS — F2089 Other schizophrenia: Secondary | ICD-10-CM | POA: Diagnosis not present

## 2017-04-21 DIAGNOSIS — F819 Developmental disorder of scholastic skills, unspecified: Secondary | ICD-10-CM | POA: Diagnosis not present

## 2017-04-21 NOTE — Telephone Encounter (Signed)
If he can be done sooner through another provider, that is okay with me.

## 2017-04-21 NOTE — Telephone Encounter (Signed)
Pt said he is having nerve block at S1 on 2/28, pt said Dr Lynann Bologna was wanting to have NCV/EMG prior to this. It has been pushed out to 3/12.  Can this pt be seen for EMG with another provider in the clinic? Please call to advise

## 2017-04-21 NOTE — Telephone Encounter (Signed)
Dr. Jannifer Franklin- please advise. c

## 2017-04-22 NOTE — Telephone Encounter (Signed)
Called pt. Scheduled with Dr. Krista Blue tomorrow at [place], and Beau at [place] for Dr. Krista Blue to do her part first then Beau per Dr. Krista Blue. Advised pt to check in by 730am. Pt verbalized understanding and appreciation for call.

## 2017-04-23 ENCOUNTER — Encounter (INDEPENDENT_AMBULATORY_CARE_PROVIDER_SITE_OTHER): Payer: Medicare HMO | Admitting: Neurology

## 2017-04-23 ENCOUNTER — Encounter: Payer: Medicare HMO | Admitting: Neurology

## 2017-04-23 ENCOUNTER — Ambulatory Visit (INDEPENDENT_AMBULATORY_CARE_PROVIDER_SITE_OTHER): Payer: Medicare HMO | Admitting: Neurology

## 2017-04-23 DIAGNOSIS — M545 Low back pain: Secondary | ICD-10-CM | POA: Diagnosis not present

## 2017-04-23 DIAGNOSIS — G8929 Other chronic pain: Secondary | ICD-10-CM

## 2017-04-23 DIAGNOSIS — Z0289 Encounter for other administrative examinations: Secondary | ICD-10-CM

## 2017-04-24 DIAGNOSIS — M5416 Radiculopathy, lumbar region: Secondary | ICD-10-CM | POA: Diagnosis not present

## 2017-04-25 ENCOUNTER — Telehealth: Payer: Self-pay | Admitting: Cardiology

## 2017-04-25 ENCOUNTER — Telehealth: Payer: Self-pay | Admitting: Pulmonary Disease

## 2017-04-25 NOTE — Telephone Encounter (Signed)
Called pt and advised message from the provider. Pt understood and verbalized understanding. Nothing further is needed.   Appt made.

## 2017-04-25 NOTE — Telephone Encounter (Signed)
Pt states he was told that he is not sleeping long enough with CPAP. Pt has trouble staying asleep. Would like to have Rx to help him stay asleep longer.  Recently given Tizanidane 2mg  take 1 tablet every 8 hours for pain.-pt is not taking this as this is not helping the pain. Pt has dx of hemiballismus.   Pharmacy: [place] for local [facility] in pharmacy.   VS please advise. Thanks.

## 2017-04-25 NOTE — Telephone Encounter (Signed)
Okay to have him see a Designer, jewellery.

## 2017-04-25 NOTE — Telephone Encounter (Signed)
New Message °

## 2017-04-25 NOTE — Telephone Encounter (Signed)
Spoke with pt, he is ok with coming in but VS doesn't have any openings. He is worried about his compliance because he is unable to sleep. He stated he would have to pay for the machine if he doesn't use it and he cannot afford it. Can he see a NP? VS please advise.

## 2017-04-25 NOTE — Telephone Encounter (Signed)
He needs an appointment to further assess before determining if he needs a sleep aide medication.

## 2017-04-25 NOTE — Telephone Encounter (Signed)
Attempted to call pt but no answer and no machine ever kicked in for me to leave a message.  Will attempt to try calling pt later

## 2017-04-25 NOTE — Telephone Encounter (Signed)
Patient returning call, CB is [phone].

## 2017-04-25 NOTE — Telephone Encounter (Signed)
Patient called in to say that "they" called and told me that I am not sleeping enough. Patient wanted advice on what he needs to do. After reviewing the chart it was determined that we do not manage his OSA. It is followed by Dr [name]. I recommended that he call his office for recommendations. Patient agrees, saying he called here because he didn't know where to call. He thanked me and said he will contact Dr. [name].

## 2017-04-28 NOTE — Procedures (Signed)
Full Name: Steven Vincent Gender: Male MRN #: [id number] Date of Birth: May 10, 1965    Visit Date: 04/23/2017 09:50 Age: 52 Years 89 Months Old Examining Physician: Marcial Pacas, MD  Referring Physician: Krista Blue, MD History: 52 year old male with history of chronic low back pain, radiating pain to left leg  Summary of the tests:  Nerve conduction study: Bilateral sural, superficial peroneal sensory responses were normal.  Bilateral tibial, peroneal to EDB motor responses were normal.  Bilateral tibial H reflexes were normal and symmetric.  Electromyography: Selective needle examination of left lower extremity muscles and left lumbosacral paraspinal muscles were normal.     Conclusion: This is a normal study.  There is no electrodiagnostic evidence of left lumbar radiculopathy or peripheral neuropathy.   ------------------------------- Marcial Pacas M.D.  [facility] [place], [place] [address] Tel: [phone] Fax: [phone]        [facility]    Nerve / Sites Muscle Latency Ref. Amplitude Ref. Rel Amp Segments Distance Velocity Ref. Area    ms ms mV mV %  cm m/s m/s mVms  L Peroneal - EDB     Ankle EDB 5.2 ?6.5 0.4 ?2.0 100 Ankle - EDB 9   1.0     Fib head EDB 13.4  0.4  102 Fib head - Ankle 37 45 ?44 1.0     Pop fossa EDB 16.1  0.7  164 Pop fossa - Fib head 12 44 ?44 1.6         Pop fossa - Ankle      R Peroneal - EDB     Ankle EDB 4.7 ?6.5 4.1 ?2.0 100 Ankle - EDB 9   12.9     Fib head EDB 12.5  3.3  80.5 Fib head - Ankle 37 48 ?44 11.6     Pop fossa EDB 14.9  3.3  99.7 Pop fossa - Fib head 12 49 ?44 11.9         Pop fossa - Ankle      L Tibial - AH     Ankle AH 4.4 ?5.8 5.2 ?4.0 100 Ankle - AH 9   12.7     Pop fossa AH 14.7  4.3  81.8 Pop fossa - Ankle 42 41 ?41 14.0  R Tibial - AH     Ankle AH 4.4 ?5.8 9.3 ?4.0 100 Ankle - AH 9   25.3     Pop fossa AH 14.7  6.8  73 Pop fossa - Ankle 42 41 ?41 20.4             SNC    Nerve / Sites Rec.  Site Peak Lat Ref.  Amp Ref. Segments Distance    ms ms V V  cm  R Sural - Ankle (Calf)     Calf Ankle 3.9 ?4.4 12 ?6 Calf - Ankle 14  L Sural - Ankle (Calf)     Calf Ankle 4.0 ?4.4 9 ?6 Calf - Ankle 14  L Superficial peroneal - Ankle     Lat leg Ankle 4.4 ?4.4 8 ?6 Lat leg - Ankle 14  R Superficial peroneal - Ankle     Lat leg Ankle 4.2 ?4.4 13 ?6 Lat leg - Ankle 14             F  Wave    Nerve F Lat Ref.   ms ms  L Tibial - AH 54.4 ?56.0  R Tibial - AH 54.6 ?  56.0         H Reflex    Nerve H Lat Lat Hmax   ms ms   Left Right Ref. Left Right Ref.  Tibial - [place] 41.2 38.9 ?35.0 35.3 34.1 ?35.0         EMG full       EMG Summary Table    Spontaneous MUAP Recruitment  Muscle IA Fib PSW Fasc Other Amp Dur. Poly Pattern  L. Tibialis anterior Normal None None None _______ Normal Normal Normal Normal  L. Tibialis posterior Normal None None None _______ Normal Normal Normal Normal  L. Peroneus longus Normal None None None _______ Normal Normal Normal Normal  L. Vastus lateralis Normal None None None _______ Normal Normal Normal Normal  L. Biceps femoris (short head) Normal None None None _______ Normal Normal Normal Normal  L. Gluteus medius Normal None None None _______ Normal Normal Normal Normal  L. Lumbar paraspinals (low) Normal None None None _______ Normal Normal Normal Normal  L. Lumbar paraspinals (mid) Normal None None None _______ Normal Normal Normal Normal

## 2017-04-28 NOTE — Progress Notes (Signed)
Please let referring physician Dr. Lynann Bologna know that EMG nerve conduction studies normal

## 2017-04-29 ENCOUNTER — Telehealth: Payer: Self-pay | Admitting: Neurology

## 2017-04-29 ENCOUNTER — Encounter: Payer: Medicare HMO | Admitting: Neurology

## 2017-04-29 NOTE — Telephone Encounter (Signed)
Dr. Jannifer Franklin- did you already discuss EMG results with pt? This is your patient that Dr. Krista Blue fit in so he could complete EMG last week.

## 2017-04-29 NOTE — Telephone Encounter (Signed)
Pt  EMG study faxed to [facility]. I mailed a copy to the pt.

## 2017-04-29 NOTE — Telephone Encounter (Signed)
The patient is seen through me in this office, but I did not order EMG evaluation.  This was done by Dr. Lynann Bologna.  The report has been sent to him.

## 2017-04-29 NOTE — Telephone Encounter (Signed)
Pt called stating that is needing his EMG results by Friday 3/8 one for him and another copy for sent to [place] Please call to advise

## 2017-05-01 ENCOUNTER — Ambulatory Visit (INDEPENDENT_AMBULATORY_CARE_PROVIDER_SITE_OTHER): Payer: Medicare HMO | Admitting: Acute Care

## 2017-05-01 ENCOUNTER — Encounter: Payer: Self-pay | Admitting: Acute Care

## 2017-05-01 VITALS — BP 112/70 | HR 58 | Ht 74.0 in | Wt 243.2 lb

## 2017-05-01 DIAGNOSIS — G4733 Obstructive sleep apnea (adult) (pediatric): Secondary | ICD-10-CM

## 2017-05-01 NOTE — Assessment & Plan Note (Addendum)
Compliant with therapy 93% of the time Has been wearing CPAP x 2 weeks since getting machine Has not been sleeping 4 hours per night due to back pain Plan: It is nice to meet you today. DME order : Please provide patient with mask liners to prevent skin break out due to mask contact. We will let [facility] know you have been compliant with your CPAP. Continue on CPAP at bedtime. You appear to be benefiting from the treatment Goal is to wear for at least 6 hours each night for maximal clinical benefit. Continue to work on weight loss, as the link between excess weight  and sleep apnea is well established.  Do not drive if sleepy. Remember to clean mask, tubing, filter, and reservoir once weekly with soapy water.  Try Melatonin 10 mg at night about 30 minutes before you go to bed. Follow up with [facility] In 2 months  or before as needed. Please contact office for sooner follow up if symptoms do not improve or worsen or seek emergency care

## 2017-05-01 NOTE — Progress Notes (Signed)
History of Present Illness Steven Vincent is a 52 y.o. male former smoker with OSA on CPAP. He is followed by Dr. [name].   05/01/2017 Follow up for OSA Pt was seen by Dr. [name] 04/10/2017 for OSA and discussion of sleep study results. Orders were written for CPAP with plans to follow up in 30-90 days after initiation of CPAP. He presents today. He has been on CPAP x 2 weeks. He has worn the CPAP every night with the exception of 1 night because he had a medical procedure for his back.Marland Kitchen He is wearing it 93.3% of the time. This exceeds 80%.The patient states he is sleeping in a recliner due to lumbar back pain. He states he is not sleeping 4 hours a night due to back pain. He has an appointment  With [place] to address his back pain 3/8. He did have a break out over the bridge of his nose that he feels is due to his CPAP mask.Marland Kitchen He states he had an area that developed a small amount of puss. It has cleared up on its own.We have asked him to let us know if this happens again so we can ensure mask fit, and resolution of the problem.. We will order mask liners. We discussed the need to use the CPAP device at least 4 hours every night. He verbalized understanding. He feels this will not be an issue once his back pain is addressed.He states he has noticed improved daytime alertness in the 2 weeks since starting his therapy.  Test Results: Down Load: 04/01/2017-04/30/2017 Auto set 5-15 cm H2O Pt was supplied with machine 2/22 and started use on that day. Usage 12/13 days Average usage>> 3 hours 57 minutes Median pressure>> 8.7 Max pressure>> 13 AHI 3.9  CBC Latest Ref Rng & Units 02/21/2016 01/19/2014 09/27/2009  WBC 4.0 - 10.5 K/uL 7.5 6.4 7.9  Hemoglobin 13.0 - 17.0 g/dL 14.7 14.9 14.1  Hematocrit 39.0 - 52.0 % 42.1 43.2 39.4  Platelets 150.0 - 400.0 K/uL 162.0 206 181.0    BMP Latest Ref Rng & Units 10/16/2016 02/21/2016 01/19/2014  Glucose 70 - 99 mg/dL 94 85 94  BUN 6 - 23 mg/dL 25(H)  25(H) 18  Creatinine 0.40 - 1.50 mg/dL 1.00 0.89 0.85  BUN/Creat Ratio 9 - 20 - - 21(H)  Sodium 135 - 145 mEq/L 142 143 140  Potassium 3.5 - 5.1 mEq/L 4.3 4.4 4.3  Chloride 96 - 112 mEq/L 110 110 103  CO2 19 - 32 mEq/L 26 26 23   Calcium 8.4 - 10.5 mg/dL 9.4 9.2 9.3    BNP No results found for: BNP  ProBNP No results found for: PROBNP  PFT No results found for: FEV1PRE, FEV1POST, FVCPRE, FVCPOST, TLC, DLCOUNC, PREFEV1FVCRT, PSTFEV1FVCRT  No results found.   Past medical hx Past Medical History:  Diagnosis Date  . Arthritis   . Baker's cyst   . Headache(784.0)   . Knee pain   . Low back pain   . Migraines   . OSA (obstructive sleep apnea) 04/10/2017  . Personality disorder ([place])   . Seizures ([place])      Social History   Tobacco Use  . Smoking status: Former Smoker    Last attempt to quit: 04/26/1990    Years since quitting: 27.0  . Smokeless tobacco: Never Used  . Tobacco comment: stopped in 1992; very smoking   Substance Use Topics  . Alcohol use: No  . Drug use: No    [name]  reports that he quit smoking about 27 years ago. he has never used smokeless tobacco. He reports that he does not drink alcohol or use drugs.  Tobacco Cessation: Former smoker Quit 1992   Past surgical hx, Family hx, Social hx all reviewed.  Current Outpatient Medications on File Prior to Visit  Medication Sig  . diclofenac sodium (VOLTAREN) 1 % GEL Apply topically as directed.  . metoprolol tartrate (LOPRESSOR) 25 MG tablet Take 6.25 mg by mouth 2 (two) times daily.  Marland Kitchen rOPINIRole (REQUIP) 0.25 MG tablet TAKE 1 TO 2 TABLETS AT BEDTIME  . topiramate (TOPAMAX) 25 MG tablet Take 25 mg by mouth 2 (two) times daily.   No current facility-administered medications on file prior to visit.      Allergies  Allergen Reactions  . Aspirin     GI upset    Review Of Systems:  Constitutional:   No  weight loss, night sweats,  Fevers, chills, fatigue, or  lassitude.  HEENT:   No  headaches,  Difficulty swallowing,  Tooth/dental problems, or  Sore throat,                No sneezing, itching, ear ache, nasal congestion, post nasal drip,   CV:  No chest pain,  Orthopnea, PND, swelling in lower extremities, anasarca, dizziness, palpitations, syncope.   GI  No heartburn, indigestion, abdominal pain, nausea, vomiting, diarrhea, change in bowel habits, loss of appetite, bloody stools.   Resp: No shortness of breath with exertion or at rest.  No excess mucus, no productive cough,  No non-productive cough,  No coughing up of blood.  No change in color of mucus.  No wheezing.  No chest wall deformity  Skin: no rash or lesions.  GU: no dysuria, change in color of urine, no urgency or frequency.  No flank pain, no hematuria   MS:  No joint pain or swelling.  No decreased range of motion.  No back pain.  Psych:  No change in mood or affect. No depression or anxiety.  No memory loss.   Vital Signs BP 112/70 (BP Location: Left Arm, Cuff Size: Normal)   Pulse (!) 58   Ht 6\' 2"  (1.88 m)   Wt 243 lb 3.2 oz (110.3 kg)   SpO2 100%   BMI 31.23 kg/m    Physical Exam:  General- No distress,  A&Ox3, pleasanr ENT: No sinus tenderness, TM clear, pale nasal mucosa, no oral exudate,no post nasal drip, no LAN Cardiac: S1, S2, regular rate and rhythm, no murmur Chest: No wheeze/ rales/ dullness; no accessory muscle use, no nasal flaring, no sternal retractions Abd.: Soft Non-tender Ext: No clubbing cyanosis, edema Neuro:  normal strength Skin: No rashes, warm and dry, healing abrasion over nose under left eye Psych: normal mood and behavior   Assessment/Plan  OSA  Compliant with CPAP therapy 93% of the time Has been wearing CPAP x 2 weeks since getting machine Has not been sleeping 4 hours per night due to back pain Plan: It is nice to meet you today. Please try to wear your CPAP machine at least 4 hours every night. DME order : Please provide patient with mask liners to  prevent skin break out due to mask contact. We will let [facility] know you have been compliant with your CPAP. Continue on CPAP at bedtime. You appear to be benefiting from the treatment Goal is to wear for at least 6 hours each night for maximal clinical benefit. Continue to work on [employer]  loss, as the link between excess weight  and sleep apnea is well established.  Do not drive if sleepy. Remember to clean mask, tubing, filter, and reservoir once weekly with soapy water.  Try Melatonin 10 mg at night about 30 minutes before you go to bed. Follow up with [facility] In 2 months  or before as needed. Please contact office for sooner follow up if symptoms do not improve or worsen or seek emergency care      Magdalen Spatz, NP 05/01/2017  8:23 PM

## 2017-05-01 NOTE — Patient Instructions (Signed)
It is nice to meet you today. DME order : Please provide patient with mask liners to prevent skin break out due to mask contact. We will let [facility] know you have been compliant with your CPAP. Continue on CPAP at bedtime. You appear to be benefiting from the treatment Goal is to wear for at least 6 hours each night for maximal clinical benefit. Continue to work on weight loss, as the link between excess weight  and sleep apnea is well established.  Do not drive if sleepy. Remember to clean mask, tubing, filter, and reservoir once weekly with soapy water.  Try Melatonin 10 mg at night about 30 minutes before you go to bed. Follow up with [facility] In 2 months  or before as needed. Please contact office for sooner follow up if symptoms do not improve or worsen or seek emergency care

## 2017-05-02 DIAGNOSIS — M545 Low back pain: Secondary | ICD-10-CM | POA: Diagnosis not present

## 2017-05-02 NOTE — Progress Notes (Signed)
Reviewed and agree with assessment/plan.   Toben Acuna, MD [facility] 02/21/2016, 12:24 PM Pager:  [phone]  

## 2017-05-05 ENCOUNTER — Other Ambulatory Visit: Payer: Self-pay

## 2017-05-05 ENCOUNTER — Ambulatory Visit: Payer: Medicare HMO | Attending: Family Medicine | Admitting: Physical Therapy

## 2017-05-05 ENCOUNTER — Ambulatory Visit (INDEPENDENT_AMBULATORY_CARE_PROVIDER_SITE_OTHER): Payer: Medicare HMO | Admitting: Family Medicine

## 2017-05-05 ENCOUNTER — Encounter: Payer: Self-pay | Admitting: Physical Therapy

## 2017-05-05 ENCOUNTER — Encounter: Payer: Self-pay | Admitting: Family Medicine

## 2017-05-05 ENCOUNTER — Encounter: Payer: Self-pay | Admitting: *Deleted

## 2017-05-05 VITALS — BP 130/84 | HR 48 | Temp 98.0°F | Resp 12 | Ht 74.0 in | Wt 240.1 lb

## 2017-05-05 DIAGNOSIS — M6281 Muscle weakness (generalized): Secondary | ICD-10-CM | POA: Diagnosis not present

## 2017-05-05 DIAGNOSIS — R001 Bradycardia, unspecified: Secondary | ICD-10-CM

## 2017-05-05 DIAGNOSIS — M545 Low back pain, unspecified: Secondary | ICD-10-CM

## 2017-05-05 DIAGNOSIS — M5440 Lumbago with sciatica, unspecified side: Secondary | ICD-10-CM | POA: Diagnosis not present

## 2017-05-05 DIAGNOSIS — M25552 Pain in left hip: Secondary | ICD-10-CM

## 2017-05-05 DIAGNOSIS — G8929 Other chronic pain: Secondary | ICD-10-CM

## 2017-05-05 NOTE — Progress Notes (Signed)
HPI:   [name] is a 52 y.o. male, who is here today to follow on recent OV.   He was seen on 03/17/17  Lower back,left hip,and knee pain He recently followed with orthopedist.  According to pt, hip injection was recommended but he refused because he wanted to have it done in the hospital under anesthesia.  States that he was not a good candidate for surgery.  PT was recommended but according to pt, it was not scheduled. He is requesting referral to "vocational rehab."   He is supposed to go back to work on 05/17/17. Constant left hip sharp pain. When asked about intensity he reports 20/10.   He also mentions swelling of some joints. Denies erythema. He states that he found about Vit K being good for joints.  Since his last OV he has been Dx with OSA and now he has a CPAP machine.  Bradycardia:  Metoprolol 25 mg 1/2 tab bid. Palpitations have improved. Denies severe/frequent headache, visual changes, chest pain, dyspnea, focal weakness.   Review of Systems  Constitutional: Positive for fatigue. Negative for appetite change, chills and fever.  Respiratory: Negative for cough, shortness of breath and wheezing.   Cardiovascular: Negative for chest pain and leg swelling.  Gastrointestinal: Negative for abdominal pain, nausea and vomiting.       No changes in bowel habits.  Genitourinary: Negative for decreased urine volume, dysuria, hematuria and urgency.  Musculoskeletal: Positive for arthralgias and back pain.  Skin: Negative for color change and rash.  Neurological: Negative for syncope, weakness and headaches.  Psychiatric/Behavioral: Negative for confusion. The patient is nervous/anxious.       Current Outpatient Medications on File Prior to Visit  Medication Sig Dispense Refill  . Acetaminophen 500 MG coapsule     . diazepam (VALIUM) 10 MG tablet TAKE 1 TABLET BY MOUTH 1 HOUR PRIOR TO PREOCEDURE, REPEAT AS NEEDED  0  . diclofenac sodium  (VOLTAREN) 1 % GEL Apply topically as directed.    Marland Kitchen HYDROcodone-acetaminophen (NORCO/VICODIN) 5-325 MG tablet TAKE 2 TABLETS BY MOUTH 2HR PRIOR TO PROCEDURE MUST HAVE DRIVER TO FACILITY  0  . metoprolol tartrate (LOPRESSOR) 25 MG tablet Take 6.25 mg by mouth 2 (two) times daily.    Marland Kitchen rOPINIRole (REQUIP) 0.25 MG tablet TAKE 1 TO 2 TABLETS AT BEDTIME 180 tablet 1  . topiramate (TOPAMAX) 25 MG tablet Take 25 mg by mouth 2 (two) times daily.    Marland Kitchen topiramate (TOPAMAX) 50 MG tablet      No current facility-administered medications on file prior to visit.      Past Medical History:  Diagnosis Date  . Arthritis   . Baker's cyst   . Headache(784.0)   . Knee pain   . Low back pain   . Migraines   . OSA (obstructive sleep apnea) 04/10/2017  . Personality disorder ([place])   . Seizures ([facility])    Allergies  Allergen Reactions  . Aspirin     GI upset    Social History   Socioeconomic History  . Marital status: Divorced    Spouse name: None  . Number of children: None  . Years of education: None  . Highest education level: None  Social Needs  . Financial resource strain: None  . Food insecurity - worry: None  . Food insecurity - inability: None  . Transportation needs - medical: None  . Transportation needs - non-medical: None  Occupational History  . Occupation: disabled  Tobacco Use  . Smoking status: Former Smoker    Last attempt to quit: 04/26/1990    Years since quitting: 27.0  . Smokeless tobacco: Never Used  . Tobacco comment: stopped in 1992; very smoking   Substance and Sexual Activity  . Alcohol use: No  . Drug use: No  . Sexual activity: Yes  Other Topics Concern  . None  Social History Narrative  . None    Vitals:   05/05/17 0920  BP: 130/84  Pulse: (!) 48  Resp: 12  Temp: 98 F (36.7 C)  SpO2: 99%   Body mass index is 30.83 kg/m.   Physical Exam  Nursing note and vitals reviewed. Constitutional: He is oriented to person, place, and time. He appears  well-developed. No distress.  HENT:  Head: Normocephalic and atraumatic.  Mouth/Throat: Oropharynx is clear and moist and mucous membranes are normal.  Eyes: Conjunctivae are normal.  Neck: No thyroid mass present.  Cardiovascular: Normal rate and regular rhythm.  No murmur heard. Pulses:      Dorsalis pedis pulses are 2+ on the right side, and 2+ on the left side.  Respiratory: Effort normal and breath sounds normal. No respiratory distress.  GI: Soft. He exhibits no mass. There is no hepatomegaly. There is no tenderness.  Musculoskeletal: He exhibits no edema.       Left hip: He exhibits decreased range of motion (mild) and tenderness.       Thoracic back: He exhibits no tenderness.       Lumbar back: He exhibits no tenderness and no bony tenderness.  Pain elicited   Lymphadenopathy:    He has no cervical adenopathy.  Neurological: He is alert and oriented to person, place, and time. He has normal strength.  Skin: Skin is warm. No erythema.  Psychiatric: His mood appears anxious. Cognition and memory are normal.  Well groomed, good eye contact.    ASSESSMENT AND PLAN:  [name] was seen today for follow-up.   Orders Placed This Encounter  Procedures  . Ambulatory referral to Physical Therapy    Bradycardia Asymptomatic.  I recommend starting weaning off Metoprolol titrate. Monitor for changes in palpitations, instructed about warning signs.   Low back pain with radiation Improved.. Zanaflex to discontinue since he does not feel like medication has helped. PT referral placed. I provided a letter for work with weight lift restriction, no more than 30 pounds at the time.   Hip pain, left Reporting severe pain, on examination today no significant limitation of range of motion. PT referral placed. Tylenol 500 mg 3-4 times per day.      Betty G. Martinique, MD  [facility]. [place] office.

## 2017-05-05 NOTE — Patient Instructions (Signed)
   Isometric Transverse abdominal contraction  Lay on your back with your knees bent.    Place your thumbs on your stomach just inside your hip bones to feel the muscle contract.  Activate your abdominals by pulling everything in.  "Try to bring your naval to your spine."  Hold this contraction for as long as possible to improve endurance.    Learn to use this muscle with daily activities such as lifting, bending, rolling.    Do 10x each side  [facility] [address], [place], [place] [address] Phone # [phone] Fax [phone]

## 2017-05-05 NOTE — Assessment & Plan Note (Signed)
Reporting severe pain, on examination today no significant limitation of range of motion. PT referral placed. Tylenol 500 mg 3-4 times per day.

## 2017-05-05 NOTE — Addendum Note (Signed)
Addended by: Lovett Calender D on: 05/05/2017 05:06 PM   Modules accepted: Orders

## 2017-05-05 NOTE — Assessment & Plan Note (Signed)
Improved.. Zanaflex to discontinue since he does not feel like medication has helped. PT referral placed. I provided a letter for work with weight lift restriction, no more than 30 pounds at the time.

## 2017-05-05 NOTE — Assessment & Plan Note (Signed)
Asymptomatic.  I recommend starting weaning off Metoprolol titrate. Monitor for changes in palpitations, instructed about warning signs.
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
## 2017-05-05 NOTE — Patient Instructions (Addendum)
A few things to remember from today's visit:   Left hip pain - Plan: Ambulatory referral to Physical Therapy  Low back pain with radiation - Plan: Ambulatory referral to Physical Therapy  Bradycardia  We will start weaning Metoprolol off. One tan daily for 10 days then every 2 days for 10 days then stop.  Stop Zanaflex because it is not helping.  PT will be arranged.  Tylenol 500 mg 3-4 times daily.  Please be sure medication list is accurate. If a new problem present, please set up appointment sooner than planned today.

## 2017-05-06 ENCOUNTER — Encounter: Payer: Medicare HMO | Admitting: Neurology

## 2017-05-07 ENCOUNTER — Ambulatory Visit: Payer: Medicare HMO | Admitting: Physical Therapy

## 2017-05-07 DIAGNOSIS — M25552 Pain in left hip: Secondary | ICD-10-CM | POA: Diagnosis not present

## 2017-05-07 DIAGNOSIS — G8929 Other chronic pain: Secondary | ICD-10-CM

## 2017-05-07 DIAGNOSIS — M545 Low back pain: Secondary | ICD-10-CM | POA: Diagnosis not present

## 2017-05-07 DIAGNOSIS — M6281 Muscle weakness (generalized): Secondary | ICD-10-CM | POA: Diagnosis not present
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
## 2017-05-07 NOTE — Patient Instructions (Signed)
   SIT TO STAND - NO SUPPORT  Start by scooting close to the front of the chair.  Next, lean forward at your trunk and reach forward with your arms and rise to standing without using your hands to push off from the chair or other object.   Use your arms as a counter-balance by reaching forward when in sitting and lower them as you approach standing.  Do 2 sets of 10    Transverse Abdominus Activation  Contract your lower abdominals as if you were trying to lift one leg from the table.  Initiate the movement but do no lift foot greater than 1 inch from the table.  Repeat opposite side. Do 10x each side   [facility] [address] [place], [place] [address] Phone # [phone] Fax [phone]

## 2017-05-12 ENCOUNTER — Encounter: Payer: Medicare HMO | Admitting: Physical Therapy

## 2017-05-12 DIAGNOSIS — F2089 Other schizophrenia: Secondary | ICD-10-CM | POA: Diagnosis not present

## 2017-05-12 DIAGNOSIS — F819 Developmental disorder of scholastic skills, unspecified: Secondary | ICD-10-CM | POA: Diagnosis not present

## 2017-05-14 ENCOUNTER — Encounter: Payer: Medicare HMO | Admitting: Physical Therapy

## 2017-05-16 DIAGNOSIS — G4733 Obstructive sleep apnea (adult) (pediatric): Secondary | ICD-10-CM | POA: Diagnosis not present

## 2017-05-19 ENCOUNTER — Telehealth: Payer: Self-pay | Admitting: Pulmonary Disease

## 2017-05-19 ENCOUNTER — Ambulatory Visit: Payer: Medicare HMO | Admitting: Cardiology

## 2017-05-19 ENCOUNTER — Ambulatory Visit: Payer: Medicare HMO | Admitting: Physical Therapy

## 2017-05-19 ENCOUNTER — Encounter: Payer: Medicare HMO | Admitting: Physical Therapy

## 2017-05-19 DIAGNOSIS — M6281 Muscle weakness (generalized): Secondary | ICD-10-CM

## 2017-05-19 DIAGNOSIS — M25552 Pain in left hip: Secondary | ICD-10-CM

## 2017-05-19 DIAGNOSIS — R6889 Other general symptoms and signs: Secondary | ICD-10-CM | POA: Diagnosis not present

## 2017-05-19 DIAGNOSIS — G8929 Other chronic pain: Secondary | ICD-10-CM | POA: Diagnosis not present

## 2017-05-19 DIAGNOSIS — M545 Low back pain: Secondary | ICD-10-CM | POA: Diagnosis not present
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
## 2017-05-19 NOTE — Telephone Encounter (Signed)
Attempted to contact pt. No answer, no option to leave a message. Will try back.  

## 2017-05-20 NOTE — Telephone Encounter (Signed)
Spoke with [place] at [facility]. He states that the pt is using his CPAP machine every night but not for 4 hours per night.  Spoke with pt. He is aware of the above information. While on the phone the pt was asking for a sleep aid. Advised him that we would have to speak to Dr. [name] about this.  Dr. [name] - please advise. Thanks.

## 2017-05-20 NOTE — Telephone Encounter (Signed)
Placed copy of patient's download on VS' desk. Will await his recommendations.

## 2017-05-20 NOTE — Telephone Encounter (Signed)
Spoke with pt. He is aware of his CPAP download results. OV has been scheduled with VS on 06/27/17 at 9:45am. Nothing further was needed.

## 2017-05-20 NOTE — Telephone Encounter (Signed)
Please get copy of his CPAP download first.  Need to make sure no adjustments to CPAP settings are needed.  He would then need a repeat ROV to determine whether sleep aide medication is needed or if there is some other issue impacting his sleep.

## 2017-05-20 NOTE — Telephone Encounter (Signed)
CPAP download shows good control with current settings, but he needs to use for entire time he is asleep.  He needs to have ROV with me schedule to determine if he needs sleep aide medication.

## 2017-05-21 ENCOUNTER — Ambulatory Visit: Payer: Medicare HMO | Admitting: Adult Health

## 2017-05-21 ENCOUNTER — Ambulatory Visit: Payer: Medicare HMO | Admitting: Cardiology

## 2017-05-21 DIAGNOSIS — M5126 Other intervertebral disc displacement, lumbar region: Secondary | ICD-10-CM | POA: Diagnosis not present

## 2017-05-21 DIAGNOSIS — M5136 Other intervertebral disc degeneration, lumbar region: Secondary | ICD-10-CM | POA: Diagnosis not present

## 2017-05-21 DIAGNOSIS — R6889 Other general symptoms and signs: Secondary | ICD-10-CM | POA: Diagnosis not present

## 2017-05-22 NOTE — Progress Notes (Signed)
Cardiology Office Note   Date:  05/23/2017   ID:  Steven Vincent, DOB April 02, 1965, MRN [id number]  PCP:  [place], Betty G, MD  Cardiologist:   Minus Breeding, MD   Chief Complaint  Patient presents with  . Chest Pain      History of Present Illness: Steven Vincent is a 52 y.o. male who presents for evaluation of possible syncope.   The events surrounding this did not confirm that this was in fact syncope.  He had a negative POET (Plain Old Exercise Treadmill) .   He had a Holter with PVCs with bigeminy, trigeminy and a triplet.   An echo was unremarkable.   A sleep study was positive for sleep apnea.  During that study he was found to have a HR in the 40s.  His beta blocker was reduced.   It was suspected that his decreased HR was related to undercounting PVCs.     Since he was last seen he has had his beta-blocker completely stopped.  He does feel the PVCs but he has had no further syncope.  He has been having chest discomfort when he walks a mile to a bakery every day.  It some moderate discomfort in his mid chest that he cannot quantify or qualify it more than to say it hurts.  It lasts for about 20 minutes after he stops what he is doing.  He does not have any of this at rest.  He is not having any new shortness of breath, PND or orthopnea.  He has been diagnosed with some back problems and has some leg weakness in his left foot.  This has limited his activity somewhat.  Past Medical History:  Diagnosis Date  . Arthritis   . Baker's cyst   . Headache(784.0)   . Knee pain   . Low back pain   . Migraines   . OSA (obstructive sleep apnea) 04/10/2017  . Personality disorder ([place])   . Seizures ([place])     Past Surgical History:  Procedure Laterality Date  . TOOTH EXTRACTION  12/2014   lower left     Current Outpatient Medications  Medication Sig Dispense Refill  . Acetaminophen 500 MG coapsule     . diclofenac sodium (VOLTAREN) 1 % GEL Apply topically as directed.      . rizatriptan (MAXALT) 10 MG tablet rizatriptan 10 mg tablet    . rOPINIRole (REQUIP) 0.25 MG tablet TAKE 1 TO 2 TABLETS AT BEDTIME 180 tablet 1  . tiZANidine (ZANAFLEX) 2 MG tablet tizanidine 2 mg tablet    . topiramate (TOPAMAX) 25 MG tablet Take 25 mg by mouth 2 (two) times daily.    Marland Kitchen topiramate (TOPAMAX) 50 MG tablet      No current facility-administered medications for this visit.     Allergies:   Aspirin    ROS:  Please see the history of present illness.   Otherwise, review of systems are positive for none.  All other systems are reviewed and negative.    PHYSICAL EXAM: VS:  BP 130/81   Pulse 60   Ht 6\' 2"  (1.88 m)   Wt 242 lb (109.8 kg)   BMI 31.07 kg/m  , BMI Body mass index is 31.07 kg/m.  GENERAL:  Well appearing NECK:  No jugular venous distention, waveform within normal limits, carotid upstroke brisk and symmetric, no bruits, no thyromegaly LUNGS:  Clear to auscultation bilaterally CHEST:  Unremarkable HEART:  PMI not displaced or sustained,S1  and S2 within normal limits, no S3, no S4, no clicks, no rubs, no murmurs ABD:  Flat, positive bowel sounds normal in frequency in pitch, no bruits, no rebound, no guarding, no midline pulsatile mass, no hepatomegaly, no splenomegaly EXT:  2 plus pulses throughout, no edema, no cyanosis no clubbing   EKG:  EKG is not  ordered today.   Recent Labs: 10/16/2016: BUN 25; Creatinine, Ser 1.00; Potassium 4.3; Sodium 142 03/17/2017: TSH 2.51    Lipid Panel    Component Value Date/Time   CHOL 134 10/16/2016 0900   TRIG 156.0 (H) 10/16/2016 0900   TRIG 231 (HH) 12/27/2005 1005   HDL 28.00 (L) 10/16/2016 0900   CHOLHDL 5 10/16/2016 0900   VLDL 31.2 10/16/2016 0900   LDLCALC 74 10/16/2016 0900   LDLDIRECT 70.8 12/17/2007 0829      Wt Readings from Last 3 Encounters:  05/23/17 242 lb (109.8 kg)  05/05/17 240 lb 2 oz (108.9 kg)  05/01/17 243 lb 3.2 oz (110.3 kg)      Other studies Reviewed: Additional studies/  records that were reviewed today include:   Review of the above records demonstrates:       ASSESSMENT AND PLAN:  PVCs:    The patient does have frequent ectopy which may be leading to under counting of his heart rate.  Is not had any frank syncope and so at this point I will evaluate as below but do not plan any further management of this.   QUESTIONABLE SYNCOPE:    This might have been related to his sleep disorder.  There was no evidence of syncope.   No further testing.   CHEST PAIN:    This has changed somewhat in quality and quantity and is exertional but not unstable at this point.  I will screen for coronary artery disease and risk stratify with a Lexiscan Myoview.  Further evaluation and management will be based on this.  Or continued/worsening symptoms.   SLEEP APNEA:  He started CPAP.  He will continue with this.  Current medicines are reviewed at length with the patient today.  The patient does not have concerns regarding medicines.  The following changes have been made:  None  Labs/ tests ordered today include:     Orders Placed This Encounter  Procedures  . MYOCARDIAL PERFUSION IMAGING     Disposition:   FU Luke Kilroy PAc in 1 month.   Signed, Minus Breeding, MD  05/23/2017 8:43 AM    [facility]

## 2017-05-23 ENCOUNTER — Encounter: Payer: Medicare HMO | Admitting: Physical Therapy

## 2017-05-23 ENCOUNTER — Ambulatory Visit (INDEPENDENT_AMBULATORY_CARE_PROVIDER_SITE_OTHER): Payer: Medicare HMO | Admitting: Cardiology

## 2017-05-23 ENCOUNTER — Encounter: Payer: Self-pay | Admitting: Cardiology

## 2017-05-23 ENCOUNTER — Telehealth (HOSPITAL_COMMUNITY): Payer: Self-pay | Admitting: Cardiology

## 2017-05-23 VITALS — BP 130/81 | HR 60 | Ht 74.0 in | Wt 242.0 lb

## 2017-05-23 DIAGNOSIS — I493 Ventricular premature depolarization: Secondary | ICD-10-CM | POA: Diagnosis not present

## 2017-05-23 DIAGNOSIS — R079 Chest pain, unspecified: Secondary | ICD-10-CM

## 2017-05-23 DIAGNOSIS — G4733 Obstructive sleep apnea (adult) (pediatric): Secondary | ICD-10-CM

## 2017-05-23 NOTE — Patient Instructions (Signed)
Medication Instructions:  Continue current medications'  If you need a refill on your cardiac medications before your next appointment, please call your pharmacy.  Labwork: None Ordered   Testing/Procedures: Your physician has requested that you have a lexiscan myoview. For further information please visit [email or web address]. Please follow instruction sheet, as given.   Follow-Up: Your physician wants you to follow-up in: 1 Month with Kerin Ransom.     Thank you for choosing [facility] at [facility]!!

## 2017-05-26 ENCOUNTER — Encounter: Payer: Self-pay | Admitting: Physical Therapy

## 2017-05-26 ENCOUNTER — Telehealth (HOSPITAL_COMMUNITY): Payer: Self-pay | Admitting: *Deleted

## 2017-05-26 ENCOUNTER — Encounter: Payer: Medicare HMO | Admitting: Physical Therapy

## 2017-05-26 ENCOUNTER — Ambulatory Visit: Payer: Medicare HMO | Attending: Family Medicine | Admitting: Physical Therapy

## 2017-05-26 DIAGNOSIS — G8929 Other chronic pain: Secondary | ICD-10-CM | POA: Diagnosis not present

## 2017-05-26 DIAGNOSIS — M25552 Pain in left hip: Secondary | ICD-10-CM | POA: Diagnosis not present

## 2017-05-26 DIAGNOSIS — M6281 Muscle weakness (generalized): Secondary | ICD-10-CM | POA: Diagnosis not present

## 2017-05-26 DIAGNOSIS — M545 Low back pain: Secondary | ICD-10-CM | POA: Diagnosis not present

## 2017-05-26 DIAGNOSIS — R6889 Other general symptoms and signs: Secondary | ICD-10-CM | POA: Diagnosis not present

## 2017-05-26 NOTE — Telephone Encounter (Signed)
User: Cherie Dark A Date/time: 05/23/17 1:18 PM  Comment: Called pt again to change the location of his test due to a change in test..RG  Context:  Outcome: Left Message  Phone number: [phone] Phone Type: Home Phone  Comm. type: Telephone Call type: Outgoing  Contact: [name] Relation to patient: Self    User: Cherie Dark A Date/time: 05/23/17 10:47 AM  Comment: Called pt and lmsg for him to CB to r/s myoview to [facility] [address] due to change in test..RG  Context:  Outcome: Left Message  Phone number: [phone] Phone Type: Home Phone  Comm. type: Telephone Call type: Outgoing  Contact: [name] Relation to patient: Self

## 2017-05-26 NOTE — Telephone Encounter (Signed)
Error
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
## 2017-05-26 NOTE — Telephone Encounter (Signed)
Attempted to call patient regarding upcoming appointment on 05/28/17. Patient states he is to be scheduled at the [facility] office and he will call them to get test rescheduled.  Kirstie Peri

## 2017-05-26 NOTE — Telephone Encounter (Signed)
Patient given detailed instructions per Myocardial Perfusion Study Information Sheet for the test on 05/28/17 at 7:30. Patient notified to arrive 15 minutes early and that it is imperative to arrive on time for appointment to keep from having the test rescheduled.  If you need to cancel or reschedule your appointment, please call the office within 24 hours of your appointment. . Patient verbalized understanding.Steven Vincent

## 2017-05-28 ENCOUNTER — Ambulatory Visit (HOSPITAL_COMMUNITY): Payer: Medicare HMO | Attending: Cardiology

## 2017-05-28 ENCOUNTER — Encounter (HOSPITAL_COMMUNITY): Payer: Medicare HMO

## 2017-05-28 ENCOUNTER — Encounter: Payer: Medicare HMO | Admitting: Physical Therapy

## 2017-05-28 DIAGNOSIS — I2510 Atherosclerotic heart disease of native coronary artery without angina pectoris: Secondary | ICD-10-CM | POA: Diagnosis not present

## 2017-05-28 DIAGNOSIS — R9439 Abnormal result of other cardiovascular function study: Secondary | ICD-10-CM | POA: Insufficient documentation

## 2017-05-28 DIAGNOSIS — I493 Ventricular premature depolarization: Secondary | ICD-10-CM | POA: Diagnosis not present

## 2017-05-28 DIAGNOSIS — R6889 Other general symptoms and signs: Secondary | ICD-10-CM | POA: Diagnosis not present

## 2017-05-28 DIAGNOSIS — Z8249 Family history of ischemic heart disease and other diseases of the circulatory system: Secondary | ICD-10-CM | POA: Diagnosis not present

## 2017-05-28 DIAGNOSIS — R079 Chest pain, unspecified: Secondary | ICD-10-CM | POA: Diagnosis not present

## 2017-05-28 LAB — MYOCARDIAL PERFUSION IMAGING
LV dias vol: 149 mL (ref 62–150)
LV sys vol: 65 mL
Peak HR: 81 {beats}/min
RATE: 0.42
Rest HR: 60 {beats}/min
SDS: 1
SRS: 6
SSS: 7
TID: 1

## 2017-05-28 MED ORDER — TECHNETIUM TC 99M TETROFOSMIN IV KIT
10.3000 | PACK | Freq: Once | INTRAVENOUS | Status: AC | PRN
  Administered 2017-05-28: 10.3 via INTRAVENOUS
  Filled 2017-05-28: qty 11

## 2017-05-28 MED ORDER — ADENOSINE (DIAGNOSTIC) 3 MG/ML IV SOLN
0.5600 mg/kg | Freq: Once | INTRAVENOUS | Status: AC
  Administered 2017-05-28: 60 mg via INTRAVENOUS

## 2017-05-28 MED ORDER — TECHNETIUM TC 99M TETROFOSMIN IV KIT
32.4000 | PACK | Freq: Once | INTRAVENOUS | Status: AC | PRN
  Administered 2017-05-28: 32.4 via INTRAVENOUS
  Filled 2017-05-28: qty 33

## 2017-06-02 ENCOUNTER — Encounter: Payer: Medicare HMO | Admitting: Physical Therapy

## 2017-06-03 ENCOUNTER — Other Ambulatory Visit: Payer: Self-pay | Admitting: Cardiology

## 2017-06-03 NOTE — Telephone Encounter (Signed)
REFILL 

## 2017-06-04 ENCOUNTER — Encounter: Payer: Medicare HMO | Admitting: Physical Therapy

## 2017-06-06 ENCOUNTER — Telehealth: Payer: Self-pay | Admitting: Pulmonary Disease

## 2017-06-06 NOTE — Telephone Encounter (Signed)
Spoke with patient. He stated that he had to turn in his CPAP machine yesterday due to not being able to afford it. He also stated that he had a stress test with his cardiologist.   Will route to Dr. [name] so he is aware.

## 2017-06-09 ENCOUNTER — Ambulatory Visit: Payer: Medicare HMO | Admitting: Physical Therapy

## 2017-06-09 ENCOUNTER — Encounter: Payer: Medicare HMO | Admitting: Physical Therapy

## 2017-06-09 DIAGNOSIS — G8929 Other chronic pain: Secondary | ICD-10-CM

## 2017-06-09 DIAGNOSIS — M25552 Pain in left hip: Secondary | ICD-10-CM | POA: Diagnosis not present

## 2017-06-09 DIAGNOSIS — M545 Low back pain: Secondary | ICD-10-CM

## 2017-06-09 DIAGNOSIS — M6281 Muscle weakness (generalized): Secondary | ICD-10-CM
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
## 2017-06-09 NOTE — Patient Instructions (Signed)
EXTENSION: Standing - Resistance Band: Stable (Active)   Stand, right arm at side. Against yellow resistance band, draw arm backward, as far as possible, keeping elbow straight. Complete __2_ sets of __15_ repetitions. Perform _2__ sessions per day.  Copyright  VHI. All rights reserved.  Row: Mid-Range - Standing   Copyright  VHI. All rights reserved.  Resistive Band Rowing   With resistive band anchored in door, grasp both ends. Keeping elbows bent, pull back, squeezing shoulder blades together. Hold ___2_ seconds. Repeat _15___ times. Do _2___ sessions per day.  http://gt2.exer.us/97   Copyright  VHI. All rights reserved.   Wall Slide    Keep head, shoulders, and back against wall, with feet out in front and slightly wider than shoulder width. Slowly lower buttocks and bend your knees only to where your knees can tolerate it.  Keep back flat. Repeat __10__ times slowly  per set. Do ___1_ sets per session. Do __1__ sessions per day.  http://orth.exer.us/152   Copyright  VHI. All rights reserved.

## 2017-06-10 NOTE — Telephone Encounter (Signed)
Noted  

## 2017-06-11 ENCOUNTER — Encounter: Payer: Self-pay | Admitting: Physical Therapy

## 2017-06-11 ENCOUNTER — Encounter: Payer: Medicare HMO | Admitting: Physical Therapy

## 2017-06-11 ENCOUNTER — Ambulatory Visit: Payer: Medicare HMO | Admitting: Physical Therapy

## 2017-06-11 DIAGNOSIS — M545 Low back pain: Secondary | ICD-10-CM

## 2017-06-11 DIAGNOSIS — R6889 Other general symptoms and signs: Secondary | ICD-10-CM | POA: Diagnosis not present

## 2017-06-11 DIAGNOSIS — G8929 Other chronic pain: Secondary | ICD-10-CM

## 2017-06-11 DIAGNOSIS — M25552 Pain in left hip: Secondary | ICD-10-CM | POA: Diagnosis not present

## 2017-06-11 DIAGNOSIS — M6281 Muscle weakness (generalized): Secondary | ICD-10-CM

## 2017-06-11 NOTE — Patient Instructions (Signed)
Hamstring Stretch (Standing)    Standing or seated on your chair, place one heel on chair or bench. Use one or both hands on thigh for support. Keeping torso straight, lean forward slowly until a stretch is felt in back of same thigh. Hold _20___ seconds. Repeat with other leg. Do 2x on each leg. Lift your heart a little more than the fellow in this picture.    Posture Awareness    Stand and check posture: Jut chin, pull back to comfortable position. Tilt pelvis forward, back; be sure back is not swayed. Roll from heels to balls of feet, then distribute your weight evenly. Picture a line through spine pulling you erect. Focus on breathing. Good Posture = Better Breathing. Check _every hour of the day.  http://gt2.exer.us/873   Copyright  VHI. All rights reserved.    Copyright  VHI. All rights reserved.
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
## 2017-06-12 ENCOUNTER — Telehealth: Payer: Self-pay | Admitting: Neurology

## 2017-06-12 ENCOUNTER — Ambulatory Visit: Payer: Medicare HMO | Admitting: Adult Health

## 2017-06-12 NOTE — Telephone Encounter (Signed)
Patient was a no show to apt today.  

## 2017-06-13 ENCOUNTER — Encounter: Payer: Self-pay | Admitting: Adult Health

## 2017-06-16 ENCOUNTER — Ambulatory Visit: Payer: Medicare HMO | Admitting: Physical Therapy

## 2017-06-16 ENCOUNTER — Encounter: Payer: Medicare HMO | Admitting: Physical Therapy

## 2017-06-17 DIAGNOSIS — R6889 Other general symptoms and signs: Secondary | ICD-10-CM | POA: Diagnosis not present

## 2017-06-17 DIAGNOSIS — F2089 Other schizophrenia: Secondary | ICD-10-CM | POA: Diagnosis not present

## 2017-06-17 DIAGNOSIS — F819 Developmental disorder of scholastic skills, unspecified: Secondary | ICD-10-CM | POA: Diagnosis not present

## 2017-06-18 ENCOUNTER — Ambulatory Visit: Payer: Medicare HMO | Admitting: Physical Therapy

## 2017-06-18 ENCOUNTER — Encounter: Payer: Medicare HMO | Admitting: Physical Therapy

## 2017-06-19 ENCOUNTER — Encounter: Payer: Self-pay | Admitting: Adult Health

## 2017-06-19 ENCOUNTER — Ambulatory Visit (INDEPENDENT_AMBULATORY_CARE_PROVIDER_SITE_OTHER): Payer: Medicare HMO | Admitting: Adult Health

## 2017-06-19 ENCOUNTER — Encounter: Payer: Self-pay | Admitting: Cardiology

## 2017-06-19 ENCOUNTER — Ambulatory Visit (INDEPENDENT_AMBULATORY_CARE_PROVIDER_SITE_OTHER): Payer: Medicare HMO | Admitting: Cardiology

## 2017-06-19 VITALS — BP 126/76 | HR 67 | Ht 74.0 in | Wt 246.2 lb

## 2017-06-19 DIAGNOSIS — R569 Unspecified convulsions: Secondary | ICD-10-CM

## 2017-06-19 DIAGNOSIS — G43009 Migraine without aura, not intractable, without status migrainosus: Secondary | ICD-10-CM

## 2017-06-19 DIAGNOSIS — I493 Ventricular premature depolarization: Secondary | ICD-10-CM | POA: Diagnosis not present

## 2017-06-19 DIAGNOSIS — R6889 Other general symptoms and signs: Secondary | ICD-10-CM | POA: Diagnosis not present

## 2017-06-19 DIAGNOSIS — G4733 Obstructive sleep apnea (adult) (pediatric): Secondary | ICD-10-CM

## 2017-06-19 DIAGNOSIS — M47817 Spondylosis without myelopathy or radiculopathy, lumbosacral region: Secondary | ICD-10-CM | POA: Insufficient documentation

## 2017-06-19 DIAGNOSIS — M5137 Other intervertebral disc degeneration, lumbosacral region: Secondary | ICD-10-CM | POA: Diagnosis not present

## 2017-06-19 DIAGNOSIS — R079 Chest pain, unspecified: Secondary | ICD-10-CM | POA: Diagnosis not present

## 2017-06-19 DIAGNOSIS — E669 Obesity, unspecified: Secondary | ICD-10-CM

## 2017-06-19 DIAGNOSIS — Z6830 Body mass index (BMI) 30.0-30.9, adult: Secondary | ICD-10-CM

## 2017-06-19 MED ORDER — TOPIRAMATE 50 MG PO TABS: 50.0000 mg | ORAL_TABLET | Freq: Three times a day (TID) | ORAL | 5 refills | Status: DC

## 2017-06-19 NOTE — Progress Notes (Signed)
I have read the note, and I agree with the clinical assessment and plan.  Charles K Willis   

## 2017-06-19 NOTE — Assessment & Plan Note (Signed)
We discussed weight loss. 

## 2017-06-19 NOTE — Progress Notes (Signed)
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 The events surrounding this did not confirm that this was in fact syncope. He had a negative POET (Plain Old Exercise Treadmill) in Nov 2018 . He had a Holter with PVCs with bigeminy, trigeminy and a triplet. Echo done Nov 208 was essential normal.He recently had a sleep study which was positive for sleep apnea but he says he is unable to afford the C-pap machine. He recently saw Dr Percival Spanish and complained of vague chest pain. He had a Myoview 05/28/17. He is in the office today for follow up. I reassured him his stress test was normal and that as far as we can tell he does not have significant heart disease and he is cleared from our standpoint to resume strenuous physical activity as tolerated. We will be happy to see this interesting gentleman in the future as needed.    Current Outpatient Medications  Medication Sig Dispense Refill  . Acetaminophen 500 MG coapsule     . diclofenac sodium (VOLTAREN) 1 % GEL Apply topically as directed.    . rizatriptan (MAXALT) 10 MG tablet rizatriptan 10 mg tablet    . rOPINIRole (REQUIP) 0.25 MG tablet TAKE 1 TO 2 TABLETS AT BEDTIME 180 tablet 1  . tiZANidine (ZANAFLEX) 2 MG tablet tizanidine 2 mg tablet    . topiramate (TOPAMAX) 50 MG tablet Take 50 mg by mouth 3 (three) times daily.      No current facility-administered medications for this visit.     Allergies  Allergen Reactions  . Aspirin     GI upset    Past Medical History:  Diagnosis Date  . Arthritis   . Baker's cyst   . Headache(784.0)   . Knee pain   . Low back pain   . Migraines   . OSA (obstructive sleep apnea) 04/10/2017  . Personality disorder ([place])   . Seizures ([place])     Social History   Socioeconomic History  . Marital  status: Divorced    Spouse name: Not on file  . Number of children: Not on file  . Years of education: Not on file  . Highest education level: Not on file  Occupational History  . Occupation: disabled  Social Needs  . Financial resource strain: Not on file  . Food insecurity:    Worry: Not on file    Inability: Not on file  . Transportation needs:    Medical: Not on file    Non-medical: Not on file  Tobacco Use  . Smoking status: Former Smoker    Last attempt to quit: 04/26/1990    Years since quitting: 27.1  . Smokeless tobacco: Never Used  . Tobacco comment: stopped in 1992; very smoking   Substance and Sexual Activity  . Alcohol use: No  . Drug use: No  . Sexual activity: Yes  Lifestyle  . Physical activity:    Days per week: Not on file    Minutes per session: Not on file  . Stress: Not on file  Relationships  . Social connections:    Talks on phone: Not on file    Gets together: Not on file    Attends religious service: Not on file    Active member of club or organization: Not on file    Attends  meetings of clubs or organizations: Not on file    Relationship status: Not on file  . Intimate partner violence:    Fear of current or ex partner: Not on file    Emotionally abused: Not on file    Physically abused: Not on file    Forced sexual activity: Not on file  Other Topics Concern  . Not on file  Social History Narrative  . Not on file     Family History  Problem Relation Age of Onset  . Heart disease Father        defibrilator  . Hyperlipidemia Father   . Hypertension Father   . Alzheimer's disease Father   . Other Father        stents in legs  . Aneurysm Mother        brain  . Hypertension Brother   . Stroke Brother        X2     Review of Systems: General: negative for chills, fever, night sweats or weight changes.  Cardiovascular: negative for chest pain, dyspnea on exertion, edema, orthopnea, palpitations, paroxysmal nocturnal dyspnea or  shortness of breath Dermatological: negative for rash Respiratory: negative for cough or wheezing Urologic: negative for hematuria Abdominal: negative for nausea, vomiting, diarrhea, bright red blood per rectum, melena, or hematemesis Neurologic: negative for visual changes, syncope, or dizziness All other systems reviewed and are otherwise negative except as noted above.    Blood pressure 112/68, pulse 68, height 6\' 2"  (1.88 m), weight 246 lb (111.6 kg).  General appearance: alert, cooperative and no distress Neck: no carotid bruit and no JVD Lungs: clear to auscultation bilaterally Heart: regular rate and rhythm Extremities: extremities normal, atraumatic, no cyanosis or edema Skin: Skin color, texture, turgor normal. No rashes or lesions Neurologic: Grossly normal   ASSESSMENT AND PLAN:   Chest pain, unspecified Myoview low risk  Class 1 obesity with body mass index (BMI) of 30.0 to 30.9 in adult We discussed weight loss  OSA (obstructive sleep apnea) Sleep study positive but patient says he was unable to afford C-pap  PVC (premature ventricular contraction) Did not tolerate beta blocker secondary to bradycardia  DJD (degenerative joint disease), lumbosacral He recently saw Dr Tonita Cong for DJD of L-S spine. He may need surgery if he doesn't improve. From a cardiac standpoint he could have this without further work up.    PLAN  PRN follow up with Dr Percival Spanish.   Kerin Ransom PA-C 06/19/2017 9:28 AM

## 2017-06-19 NOTE — Patient Instructions (Addendum)
Kerin Ransom, PA-C, recommends that you follow-up with Dr Percival Spanish as needed.  Raise the head of your bed, it will help with your sleep apnea.

## 2017-06-19 NOTE — Assessment & Plan Note (Signed)
Myoview low risk 

## 2017-06-19 NOTE — Patient Instructions (Signed)
Your Plan:  Continue Topamax If your symptoms worsen or you develop new symptoms please let us know.   Thank you for coming to see us at [facility]. I hope we have been able to provide you high quality care today.  You may receive a patient satisfaction survey over the next few weeks. We would appreciate your feedback and comments so that we may continue to improve ourselves and the health of our patients.  

## 2017-06-19 NOTE — Assessment & Plan Note (Signed)
Sleep study positive but patient says he was unable to afford C-pap

## 2017-06-19 NOTE — Assessment & Plan Note (Signed)
Did not tolerate beta blocker secondary to bradycardia

## 2017-06-19 NOTE — Assessment & Plan Note (Signed)
He recently saw Dr Tonita Cong for DJD of L-S spine. He may need surgery if he doesn't improve. From a cardiac standpoint he could have this without further work up.
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
## 2017-06-23 ENCOUNTER — Encounter: Payer: Medicare HMO | Admitting: Physical Therapy

## 2017-06-23 ENCOUNTER — Telehealth: Payer: Self-pay | Admitting: Pulmonary Disease

## 2017-06-23 DIAGNOSIS — G4733 Obstructive sleep apnea (adult) (pediatric): Secondary | ICD-10-CM

## 2017-06-23 NOTE — Telephone Encounter (Signed)
Attempted to contact pt. I did not receive an answer. There was no option for me to leave a message. Will try back.  

## 2017-06-24 DIAGNOSIS — B353 Tinea pedis: Secondary | ICD-10-CM | POA: Diagnosis not present

## 2017-06-24 DIAGNOSIS — M24572 Contracture, left ankle: Secondary | ICD-10-CM | POA: Diagnosis not present

## 2017-06-24 DIAGNOSIS — B351 Tinea unguium: Secondary | ICD-10-CM | POA: Diagnosis not present

## 2017-06-24 NOTE — Telephone Encounter (Signed)
Called pt and advised message from the provider. Pt understood and verbalized understanding. Nothing further is needed.   Order placed and pt advised.

## 2017-06-24 NOTE — Telephone Encounter (Signed)
Okay to try this.  Not sure if this is covered by insurance, and he might need to purchase on his own.

## 2017-06-24 NOTE — Telephone Encounter (Signed)
Spoke with pt, he is requesting a wedge for his pillow to help raise his bed. He does not wear his CPAP so he returned it. His cardiologist recommended he lift his bed and feels a wedge would help. Dr. [name] please advise if it's ok to send order for this.

## 2017-06-25 ENCOUNTER — Encounter: Payer: Medicare HMO | Admitting: Physical Therapy

## 2017-06-25 ENCOUNTER — Telehealth: Payer: Self-pay | Admitting: *Deleted

## 2017-06-25 DIAGNOSIS — R6889 Other general symptoms and signs: Secondary | ICD-10-CM | POA: Diagnosis not present

## 2017-06-25 DIAGNOSIS — M545 Low back pain: Secondary | ICD-10-CM | POA: Diagnosis not present

## 2017-06-25 MED ORDER — TOPIRAMATE 50 MG PO TABS: ORAL_TABLET | ORAL | 3 refills | Status: DC

## 2017-06-25 NOTE — Telephone Encounter (Addendum)
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  Needed clarification on topamax for pt.  He is taking 50mg  po AM and 100mg  po PM per office note (Dr. Jannifer Franklin when changed dose).   # 270 tabs and 3 refills.

## 2017-06-25 NOTE — Telephone Encounter (Signed)
Spoke with pt and he is taking 50mg  po am and 100mg  po pm.  I will change prescription.

## 2017-06-27 ENCOUNTER — Ambulatory Visit: Payer: Medicare HMO | Admitting: Pulmonary Disease

## 2017-07-03 ENCOUNTER — Telehealth: Payer: Self-pay | Admitting: Pulmonary Disease

## 2017-07-03 ENCOUNTER — Telehealth: Payer: Self-pay | Admitting: Adult Health

## 2017-07-03 MED ORDER — RIZATRIPTAN BENZOATE 10 MG PO TABS: ORAL_TABLET | ORAL | 0 refills | Status: DC

## 2017-07-03 NOTE — Telephone Encounter (Signed)
Pt has called re: his rizatriptan (MAXALT) 10 MG tablet, he states it was last filled on 11-25-2016 and he is aware that it is not supposed to be filled again until 11-21-2017 but he is out as a result of all the migraines he has had.  Pt is asking for a call to discuss something being called in for his migraines if he is unable to get more rizatriptan (MAXALT) 10 MG tablet

## 2017-07-03 NOTE — Telephone Encounter (Signed)
Spoke with patient-he is aware that he will need to contact [facility] regarding the wedge order. Gave [facility] number to patient.  Nothing more needed at this time.

## 2017-07-03 NOTE — Telephone Encounter (Signed)
Called pt. He stated that he has been getting Rizatriptan 10 mg refills from [facility] but he cannot remember the last time he got one. He has used them all. The last order was in Sept 2018 by Dr. Jannifer Franklin.   [place] and spoke with Luisa Hart, Education administrator. He stated that pt last refilled Rizatriptan 10 mg on 01/29/17. He has two refills ready. However the medication is temporarily out of stock. He is unable to see for how long. They can keep the prescription on file and pt can get a refill at a local pharmacy.   Spoke with patient. He agreed to send one refill to [facility] while out of stock with [facility]. He was concerned about the price but wanted to have it sent anyway.   1 month supply of Rizatriptan 10 mg [facility] to [facility] per v.o Megan NP. Take 1 tablet at onset of migraine. May repeat in 2 hours. Max 2 per day.

## 2017-07-03 NOTE — Addendum Note (Signed)
Addended by: Gildardo Griffes on: 07/03/2017 03:29 PM   Modules accepted: Orders

## 2017-07-07 ENCOUNTER — Encounter: Payer: Self-pay | Admitting: Neurology

## 2017-07-07 ENCOUNTER — Ambulatory Visit (INDEPENDENT_AMBULATORY_CARE_PROVIDER_SITE_OTHER): Payer: Medicare HMO | Admitting: Neurology

## 2017-07-07 VITALS — BP 119/73 | HR 63 | Ht 74.0 in | Wt 247.5 lb

## 2017-07-07 DIAGNOSIS — M5440 Lumbago with sciatica, unspecified side: Secondary | ICD-10-CM

## 2017-07-07 DIAGNOSIS — R569 Unspecified convulsions: Secondary | ICD-10-CM | POA: Diagnosis not present

## 2017-07-07 DIAGNOSIS — G43009 Migraine without aura, not intractable, without status migrainosus: Secondary | ICD-10-CM | POA: Diagnosis not present

## 2017-07-07 DIAGNOSIS — R6889 Other general symptoms and signs: Secondary | ICD-10-CM | POA: Diagnosis not present

## 2017-07-07 DIAGNOSIS — M545 Low back pain, unspecified: Secondary | ICD-10-CM

## 2017-07-07 MED ORDER — NORTRIPTYLINE HCL 10 MG PO CAPS: ORAL_CAPSULE | ORAL | 3 refills | Status: DC

## 2017-07-07 NOTE — Progress Notes (Signed)
Reason for visit: Seizures  Steven Vincent is an 52 y.o. male  History of present illness:  Steven Vincent is a 52 year old right-handed white male with a history of seizures that have been under relatively good control.  The patient is still having occasional migraine headaches, 3 to 4 months.  He takes Maxalt if needed for the headache.  He is on Topamax 50 mg 3 times daily for his seizures and for the migraine.  He apparently has had some back issues and has been seen by Dr. Lynann Bologna, EMG and nerve conduction study done through this office was normal involving the left leg.  The patient reports that he drags the left leg, he has pain with walking.  He has not had any falls.  The patient is not sleeping well at night, he was on CPAP but he does not use this anymore.  The patient mainly has issues with insomnia.  He returns the office today for an evaluation.  Past Medical History:  Diagnosis Date  . Arthritis   . Baker's cyst   . Headache(784.0)   . Knee pain   . Low back pain   . Migraines   . OSA (obstructive sleep apnea) 04/10/2017  . Personality disorder ([place])   . Seizures ([place])     Past Surgical History:  Procedure Laterality Date  . TOOTH EXTRACTION  12/2014   lower left    Family History  Problem Relation Age of Onset  . Heart disease Father        defibrilator  . Hyperlipidemia Father   . Hypertension Father   . Alzheimer's disease Father   . Other Father        stents in legs  . Aneurysm Mother        brain  . Hypertension Brother   . Stroke Brother        X2    Social history:  reports that he quit smoking about 27 years ago. He has never used smokeless tobacco. He reports that he does not drink alcohol or use drugs.    Allergies  Allergen Reactions  . Aspirin     GI upset    Medications:  Prior to Admission medications   Medication Sig Start Date End Date Taking? Authorizing Provider  Acetaminophen 500 MG coapsule  02/20/17  Yes [provider]  diclofenac sodium (VOLTAREN) 1 % GEL Apply topically as directed.   Yes [provider]  MELATONIN PO Take 14 mg by mouth at bedtime.   Yes [provider]  rizatriptan (MAXALT) 10 MG tablet Take 1 tablet by mouth at onset of migraine. May repeat in 2 hours if needed. May 2 per day. 07/03/17  Yes Kathrynn Ducking, MD  rOPINIRole (REQUIP) 0.25 MG tablet TAKE 1 TO 2 TABLETS AT BEDTIME 02/26/17  Yes [place], Betty G, MD  tiZANidine (ZANAFLEX) 2 MG tablet tizanidine 2 mg tablet   Yes [provider]  topiramate (TOPAMAX) 50 MG tablet Take one tablet in the AM and two tablets in the PM. 06/25/17  Yes Ward Givens, NP  nortriptyline (PAMELOR) 10 MG capsule Take one capsule at night for one week, then take 2 capsules at night for one week, then take 3 capsules at night 07/07/17   Kathrynn Ducking, MD    ROS:  Out of a complete 14 system review of symptoms, the patient complains only of the following symptoms, and all other reviewed systems are negative.  Double vision  Back pain, neck pain Headache, seizures  Blood pressure 119/73, pulse 63, height 6\' 2"  (1.88 m), weight 247 lb 8 oz (112.3 kg).  Physical Exam  General: The patient is alert and cooperative at the time of the examination.  Skin: No significant peripheral edema is noted.   Neurologic Exam  Mental status: The patient is alert and oriented x 3 at the time of the examination. The patient has apparent normal recent and remote memory, with an apparently normal attention span and concentration ability.   Cranial nerves: Facial symmetry is present. Speech is normal, no aphasia or dysarthria is noted. Extraocular movements are full. Visual fields are full.  Motor: The patient has good strength in all 4 extremities.  Sensory examination: Soft touch sensation is symmetric on the face, arms, and legs.  Coordination: The patient has good finger-nose-finger and heel-to-shin bilaterally.  Gait  and station: The patient has a normal gait. Tandem gait is normal. Romberg is negative. No drift is seen.  The patient is able to walk on heels and the toes bilaterally.  Reflexes: Deep tendon reflexes are symmetric.   Assessment/Plan:  1.  History of seizures, well controlled  2.  Migraine headache  3.  Chronic insomnia  4.  Back pain, left leg pain  The patient has no true weakness of the left leg.  The patient will continue on the Topamax, his seizures are well controlled.  We will add nortriptyline at night for the headaches as well as for the insomnia, this may help the back pain as well.  The patient will work up to 30 mg at night, he will call for any dose adjustments.  He will follow-up for his next scheduled visit.  The patient indicates that he is not working, he is in vocational rehabilitation.   Jill Alexanders MD 07/07/2017 4:27 PM  [facility] [address] [place],  [address]  Phone [phone] Fax [phone]

## 2017-07-07 NOTE — Patient Instructions (Signed)
   We will start Nortriptyline at night for sleep and for headache.  Pamelor (nortriptyline) is an antidepressant medication that has many uses that may include headache, whiplash injuries, or for peripheral neuropathy pain. Side effects may include drowsiness, dry mouth, blurred vision, or constipation. As with any antidepressant medication, worsening depression may occur. If you had any significant side effects, please call our office. The full effects of this medication may take 7-10 days after starting the drug, or going up on the dose.

## 2017-07-10 ENCOUNTER — Telehealth: Payer: Self-pay | Admitting: Neurology

## 2017-07-10 ENCOUNTER — Encounter: Payer: Self-pay | Admitting: Neurology

## 2017-07-10 NOTE — Telephone Encounter (Signed)
I will dictate a letter. 

## 2017-07-10 NOTE — Telephone Encounter (Signed)
Pt called requesting a letter stating his current medications and how his migraines/seziure can affect him working. Pt states he is working with [employer] on getting a part time job but will need this letter to help. Pt requesting [place] to fax this letter if possible to [phone] attn Loney Loh

## 2017-07-11 DIAGNOSIS — F819 Developmental disorder of scholastic skills, unspecified: Secondary | ICD-10-CM | POA: Diagnosis not present

## 2017-07-18 ENCOUNTER — Telehealth: Payer: Self-pay | Admitting: Pulmonary Disease

## 2017-07-18 ENCOUNTER — Ambulatory Visit (INDEPENDENT_AMBULATORY_CARE_PROVIDER_SITE_OTHER): Payer: Medicare HMO | Admitting: Pulmonary Disease

## 2017-07-18 ENCOUNTER — Encounter: Payer: Self-pay | Admitting: Pulmonary Disease

## 2017-07-18 VITALS — BP 120/82 | HR 66 | Ht 74.0 in | Wt 246.6 lb

## 2017-07-18 DIAGNOSIS — R6889 Other general symptoms and signs: Secondary | ICD-10-CM | POA: Diagnosis not present

## 2017-07-18 DIAGNOSIS — G4733 Obstructive sleep apnea (adult) (pediatric): Secondary | ICD-10-CM

## 2017-07-18 NOTE — Telephone Encounter (Signed)
Called the pt  He states he does not have the information in front of him at this time, and that he will have to call us back  Will await him call

## 2017-07-18 NOTE — Telephone Encounter (Signed)
Pt is calling back [phone]

## 2017-07-18 NOTE — Progress Notes (Signed)
[facility]  Chief Complaint  Patient presents with  . Follow-up    returned CPAP, using wedge reports it is working good for him.     Vital signs: BP 120/82   Pulse 66   Ht 6\' 2"  (1.88 m)   Wt 246 lb 9.6 oz (111.9 kg)   SpO2 97%   BMI 31.66 kg/m   History of Present Illness: Steven Vincent is a 52 y.o. male obstructive sleep apnea.  He was using CPAP, but apparently not enough hours per night.  He turned his machine back into his DME.  He is sleeping on a wedge.  He feels his sleep is better with this.  He was seen by neurology.  He had nortriptyline added for migraine headaches and to help with his sleep.  He goes to bed at 11 pm.  He wakes up at 7 am.  Physical Exam:  General - pleasant Eyes - pupils reactive ENT - no sinus tenderness, no oral exudate, no LAN, poor dentition Cardiac - regular, no murmur Chest - no wheeze, rales Abd - soft, non tender Ext - no edema Skin - no rashes Neuro - normal strength Psych - normal mood  Assessment/Plan:  Obstructive sleep apnea. - didn't have sufficient hours/night to keep CPAP - using wedge pillow for positional therapy - he will call with information about wedge pillow and I will then write a letter so that he can try to get insurance reimbursement  Insomnia. - discussed proper sleep hygiene - continue melatonin prn - continue nortriptyline per neurology   Patient Instructions  Call with information about your wedge pillow  Follow up with pulmonary/sleep medicine as needed    [name], MD [place] 07/18/2017, 9:32 AM Pager:  [phone]  Flow Sheet  Sleep tests: PSG 02/15/17 >> AHI 6.9, SpO2 low 85%.  Supine AHI 10.5, REM AHI 24.3. Auto CPAP 05/06/17 to 06/04/17 >> used on 28 of 30 nights with average 4 hrs 19 min.  Average AHI 3.4 with median CPAP 9 and 95 th percentile 12 cm H2O  Cardiac tests: Echo 01/09/17 >> EF 55 to 60%  Past  Medical History: He  has a past medical history of Arthritis, Baker's cyst, Headache(784.0), Knee pain, Low back pain, Migraines, OSA (obstructive sleep apnea) (04/10/2017), Personality disorder ([place]), and Seizures ([place]).  Past Surgical History: He  has a past surgical history that includes Tooth extraction (12/2014).  Family History: His family history includes Alzheimer's disease in his father; Aneurysm in his mother; Heart disease in his father; Hyperlipidemia in his father; Hypertension in his brother and father; Other in his father; Stroke in his brother.  Social History: He  reports that he quit smoking about 27 years ago. He has never used smokeless tobacco. He reports that he does not drink alcohol or use drugs.  Medications: Allergies as of 07/18/2017      Reactions   Aspirin    GI upset      Medication List        Accurate as of 07/18/17  9:32 AM. Always use your most recent med list.          Acetaminophen 500 MG coapsule   diclofenac sodium 1 % Gel Commonly known as:  VOLTAREN Apply topically as directed.   MELATONIN PO Take 14 mg by mouth at bedtime.   nortriptyline 10 MG capsule Commonly known as:  PAMELOR Take one capsule at night for one week, then  take 2 capsules at night for one week, then take 3 capsules at night   rizatriptan 10 MG tablet Commonly known as:  MAXALT Take 1 tablet by mouth at onset of migraine. May repeat in 2 hours if needed. May 2 per day.   rOPINIRole 0.25 MG tablet Commonly known as:  REQUIP TAKE 1 TO 2 TABLETS AT BEDTIME   tiZANidine 2 MG tablet Commonly known as:  ZANAFLEX tizanidine 2 mg tablet   topiramate 50 MG tablet Commonly known as:  TOPAMAX Take one tablet in the AM and two tablets in the PM.

## 2017-07-18 NOTE — Patient Instructions (Signed)
Call with information about your wedge pillow  Follow up with pulmonary/sleep medicine as needed

## 2017-07-18 NOTE — Telephone Encounter (Signed)
Spoke with the pt He states Dr [name] wanted the following information from him in order to write letter for reimbursement for a pillow he purchased:  Wedge pillow 7.55 in  Purchased on 07/04/17 for $ 31.97  from the [facility] [phone] in [place] [address])  The phone number there is [phone] and the website is [email or web address]    Please let us know when letter is complete and we will inform the pt  Thanks!

## 2017-07-21 ENCOUNTER — Other Ambulatory Visit: Payer: Self-pay | Admitting: Family Medicine

## 2017-07-22 ENCOUNTER — Ambulatory Visit: Payer: Medicare HMO | Admitting: Adult Health

## 2017-07-22 DIAGNOSIS — M7989 Other specified soft tissue disorders: Secondary | ICD-10-CM | POA: Diagnosis not present

## 2017-07-22 DIAGNOSIS — B351 Tinea unguium: Secondary | ICD-10-CM | POA: Diagnosis not present

## 2017-07-23 ENCOUNTER — Encounter: Payer: Self-pay | Admitting: Pulmonary Disease

## 2017-07-23 NOTE — Telephone Encounter (Signed)
Letter printed.  Please forward to patient.

## 2017-07-23 NOTE — Telephone Encounter (Signed)
Dr. Sood, please advise. Thanks 

## 2017-07-23 NOTE — Telephone Encounter (Signed)
Will place letter in mail today for patient.

## 2017-07-23 NOTE — Telephone Encounter (Signed)
CB is [phone].  Patient asked if letter can be mailed to address on file (which I confirmed).

## 2017-07-23 NOTE — Telephone Encounter (Signed)
Left message for patient to call back.   Letter has been pinned to the board in triage in case patient calls back.

## 2017-07-29 ENCOUNTER — Telehealth: Payer: Self-pay | Admitting: Neurology

## 2017-07-29 DIAGNOSIS — M21372 Foot drop, left foot: Secondary | ICD-10-CM | POA: Diagnosis not present

## 2017-07-29 DIAGNOSIS — M62472 Contracture of muscle, left ankle and foot: Secondary | ICD-10-CM | POA: Diagnosis not present

## 2017-07-29 MED ORDER — NORTRIPTYLINE HCL 10 MG PO CAPS: ORAL_CAPSULE | ORAL | 0 refills | Status: DC

## 2017-07-29 NOTE — Telephone Encounter (Signed)
Pt has called re: his nortriptyline (PAMELOR) 10 MG capsule Pt states the medication is doing what it needs to do and he'd like to know if a 90 day supply could be called into  [facility], [place] [phone] (Phone) [phone] (Fax)

## 2017-07-29 NOTE — Addendum Note (Signed)
Addended by: Hope Pigeon on: 07/29/2017 08:42 AM   Modules accepted: Orders

## 2017-07-29 NOTE — Telephone Encounter (Signed)
E-scribed 90 days supply to [facility] as requested.

## 2017-08-06 DIAGNOSIS — R6889 Other general symptoms and signs: Secondary | ICD-10-CM | POA: Diagnosis not present

## 2017-08-06 DIAGNOSIS — F2089 Other schizophrenia: Secondary | ICD-10-CM | POA: Diagnosis not present

## 2017-08-06 DIAGNOSIS — F819 Developmental disorder of scholastic skills, unspecified: Secondary | ICD-10-CM | POA: Diagnosis not present

## 2017-08-19 DIAGNOSIS — M79674 Pain in right toe(s): Secondary | ICD-10-CM | POA: Diagnosis not present

## 2017-08-19 DIAGNOSIS — B351 Tinea unguium: Secondary | ICD-10-CM | POA: Diagnosis not present

## 2017-08-19 DIAGNOSIS — M79675 Pain in left toe(s): Secondary | ICD-10-CM | POA: Diagnosis not present

## 2017-08-25 DIAGNOSIS — H524 Presbyopia: Secondary | ICD-10-CM | POA: Diagnosis not present

## 2017-08-25 DIAGNOSIS — H52209 Unspecified astigmatism, unspecified eye: Secondary | ICD-10-CM | POA: Diagnosis not present

## 2017-09-03 DIAGNOSIS — F819 Developmental disorder of scholastic skills, unspecified: Secondary | ICD-10-CM | POA: Diagnosis not present

## 2017-09-23 DIAGNOSIS — M21372 Foot drop, left foot: Secondary | ICD-10-CM | POA: Diagnosis not present

## 2017-09-23 DIAGNOSIS — B351 Tinea unguium: Secondary | ICD-10-CM | POA: Diagnosis not present

## 2017-09-30 ENCOUNTER — Encounter: Payer: Self-pay | Admitting: Family Medicine

## 2017-09-30 ENCOUNTER — Ambulatory Visit (INDEPENDENT_AMBULATORY_CARE_PROVIDER_SITE_OTHER): Payer: Medicare HMO | Admitting: Family Medicine

## 2017-09-30 VITALS — BP 124/82 | HR 76 | Temp 98.2°F | Resp 12 | Ht 74.0 in | Wt 254.2 lb

## 2017-09-30 DIAGNOSIS — Z6830 Body mass index (BMI) 30.0-30.9, adult: Secondary | ICD-10-CM

## 2017-09-30 DIAGNOSIS — M47817 Spondylosis without myelopathy or radiculopathy, lumbosacral region: Secondary | ICD-10-CM

## 2017-09-30 DIAGNOSIS — E6609 Other obesity due to excess calories: Secondary | ICD-10-CM

## 2017-09-30 DIAGNOSIS — E782 Mixed hyperlipidemia: Secondary | ICD-10-CM | POA: Diagnosis not present

## 2017-09-30 DIAGNOSIS — R001 Bradycardia, unspecified: Secondary | ICD-10-CM

## 2017-09-30 DIAGNOSIS — M5137 Other intervertebral disc degeneration, lumbosacral region: Secondary | ICD-10-CM | POA: Diagnosis not present

## 2017-09-30 LAB — BASIC METABOLIC PANEL
BUN: 19 mg/dL (ref 6–23)
CO2: 24 mEq/L (ref 19–32)
Calcium: 9.6 mg/dL (ref 8.4–10.5)
Chloride: 108 mEq/L (ref 96–112)
Creatinine, Ser: 0.97 mg/dL (ref 0.40–1.50)
GFR: 86.33 mL/min (ref >60.00)
Glucose, Bld: 92 mg/dL (ref 70–99)
Potassium: 4.9 mEq/L (ref 3.5–5.1)
Sodium: 140 mEq/L (ref 135–145)

## 2017-09-30 LAB — LIPID PANEL
Cholesterol: 174 mg/dL (ref 0–200)
HDL: 23.6 mg/dL — ABNORMAL LOW (ref >39.00)
NonHDL: 150.34
Total CHOL/HDL Ratio: 7
Triglycerides: 326 mg/dL — ABNORMAL HIGH (ref 0.0–149.0)
VLDL: 65.2 mg/dL — ABNORMAL HIGH (ref 0.0–40.0)

## 2017-09-30 LAB — LDL CHOLESTEROL, DIRECT: Direct LDL: 107 mg/dL

## 2017-09-30 NOTE — Patient Instructions (Addendum)
A few things to remember from today's visit:   Mixed hyperlipidemia - Plan: Lipid panel, Basic metabolic panel  Bradycardia   [name], today we have followed on some of your chronic medical problems and they seem to be stable, so no changes in current management today.  Review medication list and be sure it is accurate.  -Remember a healthy diet and regular physical activity are very important for prevention as well as for well being; they also help with many chronic problems, decreasing the need of adding new medications and delaying or preventing possible complications.  I will see you back in 12 months.  Remember to arrange your follow up appt before leaving today.  Please follow sooner than planned if a new concern arises.  Please be sure medication list is accurate. If a new problem present, please set up appointment sooner than planned today.

## 2017-09-30 NOTE — Progress Notes (Signed)
HPI:   [name] is a 52 y.o. male, who is here today for follow up.   He was last seen on 05/05/17.  Since his last OV he has followed with neurologist and cardiologist.  Bradycardia: He discontinued Metoprolol, has not had palpitations or episode of dizziness.  Lower back pain has improved. Exacerbated by prolonged walking or sitting. Alleviated by position changes.  Has not been radiated to LE's for the past few months. No saddle anesthesia or urine/bowel incontinence.  Ortho did not recommend surgery. According to pt, stated pain management and f/u prn. He is on Nortriptyline 10 mg daily and Tramadol prn.  Requesting topical analgesic, he does not recall name of the one he was applying.   Sleep study was done and CPAP was not recommended.   HLD, he is on non pharmacologic treatment.  Last FLP in 09/2016: TC 134 TG 156 HDL 28 LDL 70.8.  Concerned about his wt. He is trying to eat healthier and walking "a lot." He is not sure how much wt he has gained.    Review of Systems  Constitutional: Positive for fatigue. Negative for activity change, appetite change and fever.  HENT: Negative for nosebleeds, sore throat and trouble swallowing.   Eyes: Negative for redness and visual disturbance.  Respiratory: Negative for apnea, cough, shortness of breath and wheezing.   Cardiovascular: Negative for chest pain, palpitations and leg swelling.  Gastrointestinal: Negative for abdominal pain, nausea and vomiting.       No changes in bowel habits.  Endocrine: Negative for polydipsia, polyphagia and polyuria.  Genitourinary: Negative for decreased urine volume and hematuria.  Musculoskeletal: Positive for back pain. Negative for gait problem.  Neurological: Negative for dizziness, seizures, weakness, numbness and headaches.  Psychiatric/Behavioral: Negative for confusion. The patient is nervous/anxious.     Current Outpatient Medications on File Prior to  Visit  Medication Sig Dispense Refill  . nortriptyline (PAMELOR) 10 MG capsule Take one capsule at night for one week, then take 2 capsules at night for one week, then take 3 capsules at night 270 capsule 0  . rizatriptan (MAXALT) 10 MG tablet Take 1 tablet by mouth at onset of migraine. May repeat in 2 hours if needed. May 2 per day. 10 tablet 0  . rOPINIRole (REQUIP) 0.25 MG tablet TAKE 1 TO 2 TABLETS AT BEDTIME 180 tablet 1  . terbinafine (LAMISIL) 250 MG tablet Take 250 mg by mouth daily.  0  . topiramate (TOPAMAX) 50 MG tablet Take one tablet in the AM and two tablets in the PM. 270 tablet 3  . traMADol (ULTRAM) 50 MG tablet      No current facility-administered medications on file prior to visit.      Past Medical History:  Diagnosis Date  . Arthritis   . Baker's cyst   . Headache(784.0)   . Knee pain   . Low back pain   . Migraines   . OSA (obstructive sleep apnea) 04/10/2017  . Personality disorder ([place])   . Seizures ([facility])    Allergies  Allergen Reactions  . Aspirin     GI upset    Social History   Socioeconomic History  . Marital status: Divorced    Spouse name: Not on file  . Number of children: Not on file  . Years of education: Not on file  . Highest education level: Not on file  Occupational History  . Occupation: disabled  Social Needs  . Financial  resource strain: Not on file  . Food insecurity:    Worry: Not on file    Inability: Not on file  . Transportation needs:    Medical: Not on file    Non-medical: Not on file  Tobacco Use  . Smoking status: Former Smoker    Last attempt to quit: 04/26/1990    Years since quitting: 27.4  . Smokeless tobacco: Never Used  . Tobacco comment: stopped in 1992; very smoking   Substance and Sexual Activity  . Alcohol use: No  . Drug use: No  . Sexual activity: Yes  Lifestyle  . Physical activity:    Days per week: Not on file    Minutes per session: Not on file  . Stress: Not on file  Relationships  .  Social connections:    Talks on phone: Not on file    Gets together: Not on file    Attends religious service: Not on file    Active member of club or organization: Not on file    Attends meetings of clubs or organizations: Not on file    Relationship status: Not on file  Other Topics Concern  . Not on file  Social History Narrative   Lives   Caffeine use:    Vitals:   09/30/17 0936  BP: 124/82  Pulse: 76  Resp: 12  Temp: 98.2 F (36.8 C)  SpO2: 98%   Body mass index is 32.64 kg/m.   Wt Readings from Last 3 Encounters:  09/30/17 254 lb 4 oz (115.3 kg)  07/18/17 246 lb 9.6 oz (111.9 kg)  07/07/17 247 lb 8 oz (112.3 kg)    Physical Exam  Nursing note and vitals reviewed. Constitutional: He is oriented to person, place, and time. He appears well-developed. No distress.  HENT:  Head: Normocephalic and atraumatic.  Mouth/Throat: Oropharynx is clear and moist and mucous membranes are normal.  Eyes: Pupils are equal, round, and reactive to light. Conjunctivae are normal.  Cardiovascular: Normal rate and regular rhythm.  No murmur heard. Pulses:      Dorsalis pedis pulses are 2+ on the right side, and 2+ on the left side.  Respiratory: Effort normal and breath sounds normal. No respiratory distress.  GI: Soft. He exhibits no mass. There is no hepatomegaly. There is no tenderness.  Musculoskeletal: He exhibits no edema.       Thoracic back: He exhibits no tenderness and no bony tenderness.       Lumbar back: He exhibits no tenderness and no bony tenderness.  Lymphadenopathy:    He has no cervical adenopathy.  Neurological: He is alert and oriented to person, place, and time. He has normal strength. No cranial nerve deficit. Gait normal.  Skin: Skin is warm. No rash noted. No erythema.  Psychiatric: His mood appears anxious.  Well groomed, good eye contact.     ASSESSMENT AND PLAN:   [name] was seen today for follow-up.  Orders Placed This  Encounter  Procedures  . Lipid panel  . Basic metabolic panel  . LDL cholesterol, direct   Lab Results  Component Value Date   CHOL 174 09/30/2017   HDL 23.60 (L) 09/30/2017   LDLCALC 74 10/16/2016   LDLDIRECT 107.0 09/30/2017   TRIG 326.0 (H) 09/30/2017   CHOLHDL 7 09/30/2017   Lab Results  Component Value Date   CREATININE 0.97 09/30/2017   BUN 19 09/30/2017   NA 140 09/30/2017   K 4.9 09/30/2017   CL  108 09/30/2017   CO2 24 09/30/2017    1. Mixed hyperlipidemia  Continue non pharmacologic treatment. Further recommendations will be given according to lab results. F/U in 6-12 months.   The 10-year ASCVD risk score Mikey Bussing DC Brooke Bonito., et al., 2013) is: 6.9%   Values used to calculate the score:     Age: 45 years     Sex: Male     Is Non-Hispanic African American: No     Diabetic: No     Tobacco smoker: No     Systolic Blood Pressure: 270 mmHg     Is BP treated: No     HDL Cholesterol: 23.6 mg/dL     Total Cholesterol: 174 mg/dL  - Lipid panel - Basic metabolic panel  2. Bradycardia  Resolved after discontinuing Metoprolol. Palpitations also resolved.  F/U as needed.  3. Class 1 obesity due to excess calories with serious comorbidity and body mass index (BMI) of 30.0 to 30.9 in adult  We discussed benefits of wt loss as well as adverse effects of obesity. Consistency with healthy diet and physical activity recommended.  4. DJD (degenerative joint disease), lumbosacral  Continue following with pain management provider. Tramadol could interact with some of his medication and he has remote Hx of seizures. Voltaren gel may help, Rx sent.   - traMADol (ULTRAM) 50 MG tablet   Return in about 1 year (around 10/01/2018) for cpe/fasting.      Betty G. Martinique, MD  [facility]. [place] office.

## 2017-10-01 DIAGNOSIS — F819 Developmental disorder of scholastic skills, unspecified: Secondary | ICD-10-CM | POA: Diagnosis not present

## 2017-10-02 ENCOUNTER — Telehealth: Payer: Self-pay | Admitting: Family Medicine

## 2017-10-02 NOTE — Telephone Encounter (Signed)
Pt would like for results from 09/30/17 to be mailed to address on file as well. Unable to document this information in result note.

## 2017-10-03 ENCOUNTER — Other Ambulatory Visit: Payer: Self-pay | Admitting: *Deleted

## 2017-10-03 ENCOUNTER — Encounter: Payer: Self-pay | Admitting: *Deleted

## 2017-10-03 MED ORDER — SIMVASTATIN 20 MG PO TABS: 20.0000 mg | ORAL_TABLET | Freq: Every day | ORAL | 1 refills | Status: DC

## 2017-10-03 MED ORDER — DICLOFENAC SODIUM 1 % TD GEL: TRANSDERMAL | 1 refills | Status: DC

## 2017-10-03 NOTE — Telephone Encounter (Signed)
Results mailed to address on file as requested.

## 2017-10-13 DIAGNOSIS — M25462 Effusion, left knee: Secondary | ICD-10-CM | POA: Diagnosis not present

## 2017-10-13 DIAGNOSIS — M25461 Effusion, right knee: Secondary | ICD-10-CM | POA: Diagnosis not present

## 2017-10-20 ENCOUNTER — Encounter: Payer: Self-pay | Admitting: Family Medicine

## 2017-10-20 ENCOUNTER — Ambulatory Visit: Payer: Self-pay

## 2017-10-20 ENCOUNTER — Ambulatory Visit (INDEPENDENT_AMBULATORY_CARE_PROVIDER_SITE_OTHER): Payer: Medicare HMO | Admitting: Family Medicine

## 2017-10-20 VITALS — BP 110/74 | HR 86 | Temp 98.3°F | Wt 248.8 lb

## 2017-10-20 DIAGNOSIS — R21 Rash and other nonspecific skin eruption: Secondary | ICD-10-CM

## 2017-10-20 NOTE — Patient Instructions (Signed)
Stay out of heat as much as possible  Keep hands out of water as much as possible  Let me know if rash not improving over next several days and sooner for any new rash.

## 2017-10-20 NOTE — Telephone Encounter (Signed)
Pt. Reports he started on Simvastatin last Wednesday and broke out in localized rash to hands and left arm. The rash is very small flat, red dots. No itching or pain. States "they get redder if I get over heated." Also had "a very bag migraine over the weekend - just didn't feel good." Wants to know if this is related to the medication. Appointment made for today. No availability with Dr. Martinique.  Reason for Disposition . Localized rash present > 7 days  Answer Assessment - Initial Assessment Questions 1. APPEARANCE of RASH: "Describe the rash."      Red dots needle size 2. LOCATION: "Where is the rash located?"      Both hands top and left arm at the antecubital 3. NUMBER: "How many spots are there?"      Too numerous to count 4. SIZE: "How big are the spots?" (Inches, centimeters or compare to size of a coin)      Very Small 5. ONSET: "When did the rash start?"      Last Thursday 6. ITCHING: "Does the rash itch?" If so, ask: "How bad is the itch?"  (Scale 1-10; or mild, moderate, severe)     No 7. PAIN: "Does the rash hurt?" If so, ask: "How bad is the pain?"  (Scale 1-10; or mild, moderate, severe)     No 8. OTHER SYMPTOMS: "Do you have any other symptoms?" (e.g., fever)     Had a headache 9. PREGNANCY: "Is there any chance you are pregnant?" "When was your last menstrual period?"     N/A  Protocols used: RASH OR REDNESS - LOCALIZED-A-AH

## 2017-10-20 NOTE — Progress Notes (Signed)
  Subjective:     Patient ID: Steven Vincent, male   DOB: Mar 04, 1965, 52 y.o.   MRN: [id number]  HPI Patient seen as a work in with rash dorsum both hands with onset yesterday. He was recently started on simvastatin and was concerned this may be somehow related. He denies any generalized rash. No involvement of lower extremities or trunk. Non-pruritic. Nonpainful. Denies any fevers or chills. He had his hands and some dishwater yesterday washing dishes but no other chemical exposures. No other new medications.  Past Medical History:  Diagnosis Date  . Arthritis   . Baker's cyst   . Headache(784.0)   . Knee pain   . Low back pain   . Migraines   . OSA (obstructive sleep apnea) 04/10/2017  . Personality disorder ([place])   . Seizures ([place])    Past Surgical History:  Procedure Laterality Date  . TOOTH EXTRACTION  12/2014   lower left    reports that he quit smoking about 27 years ago. He has never used smokeless tobacco. He reports that he does not drink alcohol or use drugs. family history includes Alzheimer's disease in his father; Aneurysm in his mother; Heart disease in his father; Hyperlipidemia in his father; Hypertension in his brother and father; Other in his father; Stroke in his brother. Allergies  Allergen Reactions  . Aspirin     GI upset     Review of Systems  Constitutional: Negative for chills and fever.  Respiratory: Negative for cough and shortness of breath.   Musculoskeletal: Negative for arthralgias.  Skin: Positive for rash.  Hematological: Negative for adenopathy.       Objective:   Physical Exam  Constitutional: He appears well-developed and well-nourished.  Cardiovascular: Normal rate and regular rhythm.  Pulmonary/Chest: Effort normal and breath sounds normal.  Skin: Rash noted.  He has a couple of superficial excoriations on the dorsum of both hands and a couple digits. He has some small erythematous macular blanching spots on the dorsum of both  hands. No pustules. No vesicles. No petechiae.  We did not see any involvement of the head, neck, trunk, or lower extremities       Assessment:     Localize rash dorsum of both hands. He does have a few excoriations.. Looks like more of a local irritant type rash. We explained we do not think this is related to his simvastatin    Plan:     -try to keep hands out of water and away from any chemicals as much as possible for next few days -Follow-up with primary for any progressive or new rash  Eulas Post MD [facility]

## 2017-10-22 DIAGNOSIS — M47816 Spondylosis without myelopathy or radiculopathy, lumbar region: Secondary | ICD-10-CM | POA: Diagnosis not present

## 2017-10-22 DIAGNOSIS — Z79899 Other long term (current) drug therapy: Secondary | ICD-10-CM | POA: Diagnosis not present

## 2017-10-22 DIAGNOSIS — M1710 Unilateral primary osteoarthritis, unspecified knee: Secondary | ICD-10-CM | POA: Diagnosis not present

## 2017-10-22 DIAGNOSIS — G894 Chronic pain syndrome: Secondary | ICD-10-CM | POA: Diagnosis not present

## 2017-10-29 DIAGNOSIS — M25552 Pain in left hip: Secondary | ICD-10-CM | POA: Diagnosis not present

## 2017-10-29 DIAGNOSIS — M545 Low back pain: Secondary | ICD-10-CM | POA: Diagnosis not present

## 2017-10-29 DIAGNOSIS — G894 Chronic pain syndrome: Secondary | ICD-10-CM | POA: Diagnosis not present

## 2017-10-29 DIAGNOSIS — M25561 Pain in right knee: Secondary | ICD-10-CM | POA: Diagnosis not present

## 2017-10-30 DIAGNOSIS — M25552 Pain in left hip: Secondary | ICD-10-CM | POA: Diagnosis not present

## 2017-10-30 DIAGNOSIS — M545 Low back pain: Secondary | ICD-10-CM | POA: Diagnosis not present

## 2017-10-30 DIAGNOSIS — M25561 Pain in right knee: Secondary | ICD-10-CM | POA: Diagnosis not present

## 2017-10-30 DIAGNOSIS — G894 Chronic pain syndrome: Secondary | ICD-10-CM | POA: Diagnosis not present

## 2017-10-31 DIAGNOSIS — M545 Low back pain: Secondary | ICD-10-CM | POA: Diagnosis not present

## 2017-10-31 DIAGNOSIS — M25552 Pain in left hip: Secondary | ICD-10-CM | POA: Diagnosis not present

## 2017-10-31 DIAGNOSIS — G894 Chronic pain syndrome: Secondary | ICD-10-CM | POA: Diagnosis not present

## 2017-10-31 DIAGNOSIS — M25561 Pain in right knee: Secondary | ICD-10-CM | POA: Diagnosis not present

## 2017-11-03 DIAGNOSIS — M545 Low back pain: Secondary | ICD-10-CM | POA: Diagnosis not present

## 2017-11-03 DIAGNOSIS — G894 Chronic pain syndrome: Secondary | ICD-10-CM | POA: Diagnosis not present

## 2017-11-03 DIAGNOSIS — M25561 Pain in right knee: Secondary | ICD-10-CM | POA: Diagnosis not present

## 2017-11-03 DIAGNOSIS — M25552 Pain in left hip: Secondary | ICD-10-CM | POA: Diagnosis not present

## 2017-11-05 DIAGNOSIS — G894 Chronic pain syndrome: Secondary | ICD-10-CM | POA: Diagnosis not present

## 2017-11-05 DIAGNOSIS — M545 Low back pain: Secondary | ICD-10-CM | POA: Diagnosis not present

## 2017-11-05 DIAGNOSIS — M25561 Pain in right knee: Secondary | ICD-10-CM | POA: Diagnosis not present

## 2017-11-05 DIAGNOSIS — M25552 Pain in left hip: Secondary | ICD-10-CM | POA: Diagnosis not present

## 2017-11-07 DIAGNOSIS — M545 Low back pain: Secondary | ICD-10-CM | POA: Diagnosis not present

## 2017-11-07 DIAGNOSIS — M25561 Pain in right knee: Secondary | ICD-10-CM | POA: Diagnosis not present

## 2017-11-07 DIAGNOSIS — M25552 Pain in left hip: Secondary | ICD-10-CM | POA: Diagnosis not present

## 2017-11-07 DIAGNOSIS — G894 Chronic pain syndrome: Secondary | ICD-10-CM | POA: Diagnosis not present

## 2017-11-10 DIAGNOSIS — M47816 Spondylosis without myelopathy or radiculopathy, lumbar region: Secondary | ICD-10-CM | POA: Diagnosis not present

## 2017-11-10 DIAGNOSIS — Z79899 Other long term (current) drug therapy: Secondary | ICD-10-CM | POA: Diagnosis not present

## 2017-11-10 DIAGNOSIS — G894 Chronic pain syndrome: Secondary | ICD-10-CM | POA: Diagnosis not present

## 2017-11-10 DIAGNOSIS — M1710 Unilateral primary osteoarthritis, unspecified knee: Secondary | ICD-10-CM | POA: Diagnosis not present

## 2017-11-11 DIAGNOSIS — M25561 Pain in right knee: Secondary | ICD-10-CM | POA: Diagnosis not present

## 2017-11-11 DIAGNOSIS — G894 Chronic pain syndrome: Secondary | ICD-10-CM | POA: Diagnosis not present

## 2017-11-11 DIAGNOSIS — M545 Low back pain: Secondary | ICD-10-CM | POA: Diagnosis not present

## 2017-11-11 DIAGNOSIS — M25552 Pain in left hip: Secondary | ICD-10-CM | POA: Diagnosis not present

## 2017-11-12 ENCOUNTER — Encounter: Payer: Self-pay | Admitting: Family Medicine

## 2017-11-12 ENCOUNTER — Ambulatory Visit (INDEPENDENT_AMBULATORY_CARE_PROVIDER_SITE_OTHER): Payer: Medicare HMO | Admitting: Family Medicine

## 2017-11-12 VITALS — BP 120/70 | HR 74 | Temp 98.5°F | Resp 16 | Ht 74.0 in | Wt 250.1 lb

## 2017-11-12 DIAGNOSIS — Z0000 Encounter for general adult medical examination without abnormal findings: Secondary | ICD-10-CM | POA: Diagnosis not present

## 2017-11-12 DIAGNOSIS — E782 Mixed hyperlipidemia: Secondary | ICD-10-CM

## 2017-11-12 NOTE — Progress Notes (Signed)
HPI:  Steven Vincent is a 51 y.o.male here today for his routine physical examination.  Last CPE: 03/04/17 He lives alone.  Regular exercise 3 or more times per week: Yes,playing golf a few times per week. Following a healthy diet: Trying to do so. He is working part time, washing dishes.  He needs form completed to be able to participate in [employer].  Chronic medical problems: Schizotypal personality disorder,HLD,chronic back pain,hearing loss,migraines,and "intellectual disability." He is not sexually active.  Chronic back pain , he is following with pain management. He is doing PT,which has helped with pain.  Hx of STD's: Denies.  Immunization History  Administered Date(s) Administered  . Influenza Split 12/03/2010  . Influenza Whole 12/04/2006, 11/17/2007, 01/11/2009  . Td 12/04/2006    Last colon cancer screening: Colonoscopy 08/2015. Last prostate ca screening: Has not had one recently. Denies dysuria,increased urinary frequency, gross hematuria,or decreased urine output.  -Denies high alcohol intake, tobacco use, or Hx of illicit drug use.   HLD: He is on Simvastatin 20 mg daily. Tolerating medication well.  Lab Results  Component Value Date   CHOL 174 09/30/2017   HDL 23.60 (L) 09/30/2017   LDLCALC 74 10/16/2016   LDLDIRECT 107.0 09/30/2017   TRIG 326.0 (H) 09/30/2017   CHOLHDL 7 09/30/2017     Review of Systems  Constitutional: Positive for fatigue (No more than usual). Negative for activity change, appetite change and fever.  HENT: Negative for dental problem, nosebleeds, sore throat, trouble swallowing and voice change.   Eyes: Negative for redness and visual disturbance.  Respiratory: Negative for cough, shortness of breath and wheezing.   Cardiovascular: Negative for chest pain, palpitations and leg swelling.  Gastrointestinal: Negative for abdominal pain, blood in stool, nausea and vomiting.       No changes in bowel  habits.   Endocrine: Negative for polydipsia, polyphagia and polyuria.  Genitourinary: Negative for decreased urine volume, dysuria, genital sores, hematuria and testicular pain.  Musculoskeletal: Positive for arthralgias and back pain. Negative for gait problem and myalgias.  Skin: Negative for color change and rash.  Neurological: Positive for headaches (Hx of migraines. ). Negative for syncope and weakness.  Hematological: Negative for adenopathy. Does not bruise/bleed easily.  Psychiatric/Behavioral: Negative for confusion and sleep disturbance. The patient is nervous/anxious.      Current Outpatient Medications on File Prior to Visit  Medication Sig Dispense Refill  . baclofen (LIORESAL) 10 MG tablet Take 10 mg by mouth 2 (two) times daily.  0  . diclofenac sodium (VOLTAREN) 1 % GEL Apply topically as directed. 1 Tube 1  . rizatriptan (MAXALT) 10 MG tablet Take 1 tablet by mouth at onset of migraine. May repeat in 2 hours if needed. May 2 per day. 10 tablet 0  . rOPINIRole (REQUIP) 0.25 MG tablet TAKE 1 TO 2 TABLETS AT BEDTIME 180 tablet 1  . simvastatin (ZOCOR) 20 MG tablet Take 1 tablet (20 mg total) by mouth at bedtime. 90 tablet 1  . topiramate (TOPAMAX) 50 MG tablet Take one tablet in the AM and two tablets in the PM. 270 tablet 3  . traMADol (ULTRAM) 50 MG tablet      No current facility-administered medications on file prior to visit.      Past Medical History:  Diagnosis Date  . Arthritis   . Baker's cyst   . Headache(784.0)   . Knee pain   . Low back pain   . Migraines   .  OSA (obstructive sleep apnea) 04/10/2017  . Personality disorder ([place])   . Seizures ([place])     Past Surgical History:  Procedure Laterality Date  . TOOTH EXTRACTION  12/2014   lower left    Allergies  Allergen Reactions  . Aspirin     GI upset Other reaction(s): Vomiting    Family History  Problem Relation Age of Onset  . Heart disease Father        defibrilator  . Hyperlipidemia  Father   . Hypertension Father   . Alzheimer's disease Father   . Other Father        stents in legs  . Aneurysm Mother        brain  . Hypertension Brother   . Stroke Brother        X2    Social History   Socioeconomic History  . Marital status: Divorced    Spouse name: Not on file  . Number of children: Not on file  . Years of education: Not on file  . Highest education level: Not on file  Occupational History  . Occupation: disabled  Social Needs  . Financial resource strain: Not on file  . Food insecurity:    Worry: Not on file    Inability: Not on file  . Transportation needs:    Medical: Not on file    Non-medical: Not on file  Tobacco Use  . Smoking status: Former Smoker    Last attempt to quit: 04/26/1990    Years since quitting: 27.5  . Smokeless tobacco: Never Used  . Tobacco comment: stopped in 1992; very smoking   Substance and Sexual Activity  . Alcohol use: No  . Drug use: No  . Sexual activity: Yes  Lifestyle  . Physical activity:    Days per week: Not on file    Minutes per session: Not on file  . Stress: Not on file  Relationships  . Social connections:    Talks on phone: Not on file    Gets together: Not on file    Attends religious service: Not on file    Active member of club or organization: Not on file    Attends meetings of clubs or organizations: Not on file    Relationship status: Not on file  Other Topics Concern  . Not on file  Social History Narrative   Lives   Caffeine use:     Vitals:   11/12/17 0750  BP: 120/70  Pulse: 74  Resp: 16  Temp: 98.5 F (36.9 C)  SpO2: 98%   Body mass index is 32.11 kg/m.   Wt Readings from Last 3 Encounters:  11/12/17 250 lb 2 oz (113.5 kg)  10/20/17 248 lb 12.8 oz (112.9 kg)  09/30/17 254 lb 4 oz (115.3 kg)      Physical Exam  Nursing note and vitals reviewed. Constitutional: He is oriented to person, place, and time. He appears well-developed. No distress.  HENT:  Head:  Normocephalic and atraumatic.  Right Ear: Tympanic membrane, external ear and ear canal normal.  Left Ear: Tympanic membrane, external ear and ear canal normal.  Mouth/Throat: Oropharynx is clear and moist and mucous membranes are normal.  Hearing aids, bilateral.  Eyes: Pupils are equal, round, and reactive to light. Conjunctivae and EOM are normal.  Neck: Normal range of motion. No tracheal deviation present. No thyromegaly present.  Cardiovascular: Normal rate. An irregular rhythm present.  No murmur heard. Pulses:      Dorsalis  pedis pulses are 2+ on the right side, and 2+ on the left side.  HR 75/min by my count.  Respiratory: Effort normal and breath sounds normal. No respiratory distress.  GI: Soft. He exhibits no mass. There is no hepatomegaly. There is no tenderness.  Genitourinary:  Genitourinary Comments: Refused,no concerns.  Musculoskeletal: He exhibits edema (Trace pitting LE edema,bilateral.). He exhibits no tenderness.  No major deformities appreciated and no signs of synovitis.  Lymphadenopathy:    He has no cervical adenopathy.       Right: No supraclavicular adenopathy present.       Left: No supraclavicular adenopathy present.  Neurological: He is alert and oriented to person, place, and time. He has normal strength. No cranial nerve deficit or sensory deficit. Coordination and gait normal.  Reflex Scores:      Bicep reflexes are 2+ on the right side and 2+ on the left side.      Patellar reflexes are 2+ on the right side and 2+ on the left side. DTR's symmetric on 4 extremities.  Skin: Skin is warm. No erythema.  Psychiatric: He has a normal mood and affect. Cognition and memory are normal.     ASSESSMENT AND PLAN:  Steven Vincent was seen today for annual exam.  Diagnoses and all orders for this visit:  Routine general medical examination at a health care facility  We discussed the importance of regular physical activity and healthy diet for prevention of  chronic illness and/or complications. Preventive guidelines reviewed. He does not want PSA done today.  Vaccination: Refused Tdap and influenza vaccine. Form completed.  Next CPE in a year.  Mixed hyperlipidemia  No changes in Simvastatin 20 mg daily. Low fat diet also recommended. F/U in a year.   Return in 1 year (on 11/13/2018) for CPE.    Keneisha Heckart G. [place], MD  [facility]. [place] office.

## 2017-11-12 NOTE — Patient Instructions (Signed)
A few things to remember from today's visit:   No diagnosis found.   At least 150 minutes of moderate exercise per week, daily brisk walking for 15-30 min is a good exercise option. Healthy diet low in saturated (animal) fats and sweets and consisting of fresh fruits and vegetables, lean meats such as fish and white chicken and whole grains.  - Vaccines:  Tdap vaccine every 10 years.  Shingles vaccine recommended at age 52, could be given after 52 years of age but not sure about insurance coverage.  Pneumonia vaccines:  Prevnar 35 at 31 and Pneumovax at 29.   -Screening recommendations for low/normal risk males:  Screening for diabetes at age 28 and every 3 years. Earlier screening if cardiovascular risk factors.  Colon cancer screening at age 43 and until age 77.  Prostate cancer screening: some controversy, starts usually at 41: Rectal exam and PSA.  Aortic Abdominal Aneurism once between 55 and 60 years old if ever smoker.  Also recommended:  1. Dental visit- Brush and floss your teeth twice daily; visit your dentist twice a year. 2. Eye doctor- Get an eye exam at least every 2 years. 3. Helmet use- Always wear a helmet when riding a bicycle, motorcycle, rollerblading or skateboarding. 4. Safe sex- If you may be exposed to sexually transmitted infections, use a condom. 5. Seat belts- Seat belts can save your live; always wear one. 6. Smoke/Carbon Monoxide detectors- These detectors need to be installed on the appropriate level of your home. Replace batteries at least once a year. 7. Skin cancer- When out in the sun please cover up and use sunscreen 15 SPF or higher. 8. Violence- If anyone is threatening or hurting you, please tell your healthcare provider.  9. Drink alcohol in moderation- Limit alcohol intake to one drink or less per day. Never drink and drive.  Please be sure medication list is accurate. If a new problem present, please set up appointment sooner than  planned today.

## 2017-11-13 DIAGNOSIS — M25561 Pain in right knee: Secondary | ICD-10-CM | POA: Diagnosis not present

## 2017-11-13 DIAGNOSIS — M25552 Pain in left hip: Secondary | ICD-10-CM | POA: Diagnosis not present

## 2017-11-13 DIAGNOSIS — G894 Chronic pain syndrome: Secondary | ICD-10-CM | POA: Diagnosis not present

## 2017-11-13 DIAGNOSIS — M545 Low back pain: Secondary | ICD-10-CM | POA: Diagnosis not present

## 2017-11-18 DIAGNOSIS — M545 Low back pain: Secondary | ICD-10-CM | POA: Diagnosis not present

## 2017-11-18 DIAGNOSIS — M25552 Pain in left hip: Secondary | ICD-10-CM | POA: Diagnosis not present

## 2017-11-18 DIAGNOSIS — G894 Chronic pain syndrome: Secondary | ICD-10-CM | POA: Diagnosis not present

## 2017-11-18 DIAGNOSIS — M25561 Pain in right knee: Secondary | ICD-10-CM | POA: Diagnosis not present

## 2017-11-19 DIAGNOSIS — M25561 Pain in right knee: Secondary | ICD-10-CM | POA: Diagnosis not present

## 2017-11-19 DIAGNOSIS — M25552 Pain in left hip: Secondary | ICD-10-CM | POA: Diagnosis not present

## 2017-11-19 DIAGNOSIS — G894 Chronic pain syndrome: Secondary | ICD-10-CM | POA: Diagnosis not present

## 2017-11-19 DIAGNOSIS — M545 Low back pain: Secondary | ICD-10-CM | POA: Diagnosis not present

## 2017-11-26 DIAGNOSIS — M545 Low back pain: Secondary | ICD-10-CM | POA: Diagnosis not present

## 2017-11-26 DIAGNOSIS — M25561 Pain in right knee: Secondary | ICD-10-CM | POA: Diagnosis not present

## 2017-11-26 DIAGNOSIS — G894 Chronic pain syndrome: Secondary | ICD-10-CM | POA: Diagnosis not present

## 2017-11-26 DIAGNOSIS — M25552 Pain in left hip: Secondary | ICD-10-CM | POA: Diagnosis not present

## 2017-11-27 DIAGNOSIS — G894 Chronic pain syndrome: Secondary | ICD-10-CM | POA: Diagnosis not present

## 2017-11-27 DIAGNOSIS — M25561 Pain in right knee: Secondary | ICD-10-CM | POA: Diagnosis not present

## 2017-11-27 DIAGNOSIS — M25552 Pain in left hip: Secondary | ICD-10-CM | POA: Diagnosis not present

## 2017-11-27 DIAGNOSIS — M545 Low back pain: Secondary | ICD-10-CM | POA: Diagnosis not present

## 2017-12-01 DIAGNOSIS — M25521 Pain in right elbow: Secondary | ICD-10-CM | POA: Diagnosis not present

## 2017-12-04 DIAGNOSIS — M545 Low back pain: Secondary | ICD-10-CM | POA: Diagnosis not present

## 2017-12-04 DIAGNOSIS — G894 Chronic pain syndrome: Secondary | ICD-10-CM | POA: Diagnosis not present

## 2017-12-04 DIAGNOSIS — M25561 Pain in right knee: Secondary | ICD-10-CM | POA: Diagnosis not present

## 2017-12-04 DIAGNOSIS — M25552 Pain in left hip: Secondary | ICD-10-CM | POA: Diagnosis not present

## 2017-12-08 DIAGNOSIS — F819 Developmental disorder of scholastic skills, unspecified: Secondary | ICD-10-CM | POA: Diagnosis not present

## 2017-12-10 DIAGNOSIS — Z79899 Other long term (current) drug therapy: Secondary | ICD-10-CM | POA: Diagnosis not present

## 2017-12-10 DIAGNOSIS — M1990 Unspecified osteoarthritis, unspecified site: Secondary | ICD-10-CM | POA: Diagnosis not present

## 2017-12-10 DIAGNOSIS — G894 Chronic pain syndrome: Secondary | ICD-10-CM | POA: Diagnosis not present

## 2017-12-11 ENCOUNTER — Other Ambulatory Visit: Payer: Self-pay | Admitting: Family Medicine

## 2017-12-11 ENCOUNTER — Other Ambulatory Visit: Payer: Self-pay | Admitting: Neurology

## 2017-12-13 DIAGNOSIS — M25572 Pain in left ankle and joints of left foot: Secondary | ICD-10-CM | POA: Diagnosis not present

## 2017-12-13 DIAGNOSIS — M25571 Pain in right ankle and joints of right foot: Secondary | ICD-10-CM | POA: Diagnosis not present

## 2017-12-17 DIAGNOSIS — M25561 Pain in right knee: Secondary | ICD-10-CM | POA: Diagnosis not present

## 2017-12-17 DIAGNOSIS — M25552 Pain in left hip: Secondary | ICD-10-CM | POA: Diagnosis not present

## 2017-12-17 DIAGNOSIS — G894 Chronic pain syndrome: Secondary | ICD-10-CM | POA: Diagnosis not present

## 2017-12-17 DIAGNOSIS — M545 Low back pain: Secondary | ICD-10-CM | POA: Diagnosis not present

## 2017-12-19 ENCOUNTER — Telehealth: Payer: Self-pay | Admitting: *Deleted

## 2017-12-19 DIAGNOSIS — M545 Low back pain: Secondary | ICD-10-CM | POA: Diagnosis not present

## 2017-12-19 DIAGNOSIS — M25561 Pain in right knee: Secondary | ICD-10-CM | POA: Diagnosis not present

## 2017-12-19 DIAGNOSIS — G894 Chronic pain syndrome: Secondary | ICD-10-CM | POA: Diagnosis not present

## 2017-12-19 DIAGNOSIS — M25552 Pain in left hip: Secondary | ICD-10-CM | POA: Diagnosis not present

## 2017-12-19 NOTE — Telephone Encounter (Signed)
Pt called after hours. Dr. Leonie Man received the message. Pt wanted to know if nortriptyline was an antiinflammatory. Advised it was not.  He states he has been out of the medication for two months. [employer] did not ship him the medication. When he spoke with [facility] they said they could not reach him to verify shipment info. Pt never received any calls.   He just received nortriptyline today in the mail.  I gave him taper instruction again to restart medication: "TAKE 1 CAPSULE AT NIGHT FOR ONE WEEK, THEN TAKE 2 CAPSULES AT NIGHT FOR ONE WEEK, THEN TAKE 3 CAPSULES AT NIGHT". He states migraines worse since being off of medication. I advised it can take 2-3 weeks to take full effect again. He has upcoming follow up on 01/14/18 at 930am. I reminded pt about this. He verbalized understanding and appreciation.

## 2017-12-23 DIAGNOSIS — M25552 Pain in left hip: Secondary | ICD-10-CM | POA: Diagnosis not present

## 2017-12-23 DIAGNOSIS — M25561 Pain in right knee: Secondary | ICD-10-CM | POA: Diagnosis not present

## 2017-12-23 DIAGNOSIS — M545 Low back pain: Secondary | ICD-10-CM | POA: Diagnosis not present

## 2017-12-23 DIAGNOSIS — G894 Chronic pain syndrome: Secondary | ICD-10-CM | POA: Diagnosis not present

## 2017-12-25 DIAGNOSIS — M545 Low back pain: Secondary | ICD-10-CM | POA: Diagnosis not present

## 2017-12-25 DIAGNOSIS — G894 Chronic pain syndrome: Secondary | ICD-10-CM | POA: Diagnosis not present

## 2017-12-25 DIAGNOSIS — M25561 Pain in right knee: Secondary | ICD-10-CM | POA: Diagnosis not present

## 2017-12-25 DIAGNOSIS — M25552 Pain in left hip: Secondary | ICD-10-CM | POA: Diagnosis not present

## 2017-12-29 DIAGNOSIS — F2089 Other schizophrenia: Secondary | ICD-10-CM | POA: Diagnosis not present

## 2017-12-30 DIAGNOSIS — M25552 Pain in left hip: Secondary | ICD-10-CM | POA: Diagnosis not present

## 2017-12-30 DIAGNOSIS — M25561 Pain in right knee: Secondary | ICD-10-CM | POA: Diagnosis not present

## 2017-12-30 DIAGNOSIS — G894 Chronic pain syndrome: Secondary | ICD-10-CM | POA: Diagnosis not present

## 2017-12-30 DIAGNOSIS — M545 Low back pain: Secondary | ICD-10-CM | POA: Diagnosis not present

## 2018-01-01 DIAGNOSIS — G894 Chronic pain syndrome: Secondary | ICD-10-CM | POA: Diagnosis not present

## 2018-01-01 DIAGNOSIS — M25561 Pain in right knee: Secondary | ICD-10-CM | POA: Diagnosis not present

## 2018-01-01 DIAGNOSIS — M545 Low back pain: Secondary | ICD-10-CM | POA: Diagnosis not present

## 2018-01-01 DIAGNOSIS — M25552 Pain in left hip: Secondary | ICD-10-CM | POA: Diagnosis not present

## 2018-01-05 DIAGNOSIS — M1712 Unilateral primary osteoarthritis, left knee: Secondary | ICD-10-CM | POA: Diagnosis not present

## 2018-01-05 DIAGNOSIS — M1711 Unilateral primary osteoarthritis, right knee: Secondary | ICD-10-CM | POA: Diagnosis not present

## 2018-01-05 DIAGNOSIS — M170 Bilateral primary osteoarthritis of knee: Secondary | ICD-10-CM | POA: Diagnosis not present

## 2018-01-07 DIAGNOSIS — G894 Chronic pain syndrome: Secondary | ICD-10-CM | POA: Diagnosis not present

## 2018-01-07 DIAGNOSIS — M25561 Pain in right knee: Secondary | ICD-10-CM | POA: Diagnosis not present

## 2018-01-07 DIAGNOSIS — M545 Low back pain: Secondary | ICD-10-CM | POA: Diagnosis not present

## 2018-01-07 DIAGNOSIS — M25552 Pain in left hip: Secondary | ICD-10-CM | POA: Diagnosis not present

## 2018-01-09 DIAGNOSIS — G894 Chronic pain syndrome: Secondary | ICD-10-CM | POA: Diagnosis not present

## 2018-01-09 DIAGNOSIS — Z79899 Other long term (current) drug therapy: Secondary | ICD-10-CM | POA: Diagnosis not present

## 2018-01-09 DIAGNOSIS — M1990 Unspecified osteoarthritis, unspecified site: Secondary | ICD-10-CM | POA: Diagnosis not present

## 2018-01-09 DIAGNOSIS — M47816 Spondylosis without myelopathy or radiculopathy, lumbar region: Secondary | ICD-10-CM | POA: Diagnosis not present

## 2018-01-12 DIAGNOSIS — M1712 Unilateral primary osteoarthritis, left knee: Secondary | ICD-10-CM | POA: Diagnosis not present

## 2018-01-12 DIAGNOSIS — M1711 Unilateral primary osteoarthritis, right knee: Secondary | ICD-10-CM | POA: Diagnosis not present

## 2018-01-14 ENCOUNTER — Telehealth: Payer: Self-pay | Admitting: *Deleted

## 2018-01-14 ENCOUNTER — Ambulatory Visit: Payer: Medicare HMO | Admitting: Adult Health

## 2018-01-14 NOTE — Telephone Encounter (Signed)
Patient was no show for follow up with NP today.  

## 2018-01-15 ENCOUNTER — Encounter: Payer: Self-pay | Admitting: Adult Health

## 2018-01-15 DIAGNOSIS — M545 Low back pain: Secondary | ICD-10-CM | POA: Diagnosis not present

## 2018-01-15 DIAGNOSIS — M25552 Pain in left hip: Secondary | ICD-10-CM | POA: Diagnosis not present

## 2018-01-15 DIAGNOSIS — G894 Chronic pain syndrome: Secondary | ICD-10-CM | POA: Diagnosis not present

## 2018-01-15 DIAGNOSIS — M25561 Pain in right knee: Secondary | ICD-10-CM | POA: Diagnosis not present

## 2018-01-19 DIAGNOSIS — M1711 Unilateral primary osteoarthritis, right knee: Secondary | ICD-10-CM | POA: Diagnosis not present

## 2018-01-19 DIAGNOSIS — M1712 Unilateral primary osteoarthritis, left knee: Secondary | ICD-10-CM | POA: Diagnosis not present

## 2018-01-21 ENCOUNTER — Ambulatory Visit (INDEPENDENT_AMBULATORY_CARE_PROVIDER_SITE_OTHER): Payer: Medicare HMO | Admitting: Adult Health

## 2018-01-21 ENCOUNTER — Encounter: Payer: Self-pay | Admitting: Adult Health

## 2018-01-21 VITALS — BP 118/62 | HR 52 | Ht 74.0 in | Wt 253.0 lb

## 2018-01-21 DIAGNOSIS — R569 Unspecified convulsions: Secondary | ICD-10-CM | POA: Diagnosis not present

## 2018-01-21 DIAGNOSIS — G43009 Migraine without aura, not intractable, without status migrainosus: Secondary | ICD-10-CM

## 2018-01-21 DIAGNOSIS — F2089 Other schizophrenia: Secondary | ICD-10-CM | POA: Diagnosis not present

## 2018-01-21 NOTE — Progress Notes (Signed)
I have read the note, and I agree with the clinical assessment and plan.  Christien Berthelot K Trice Aspinall   

## 2018-01-21 NOTE — Progress Notes (Signed)
PATIENT: Steven Vincent DOB: 1965/04/02  REASON FOR VISIT: follow up HISTORY FROM: patient  HISTORY OF PRESENT ILLNESS: Today 01/21/18:  Mr. [name] is a 52 year old male with a history of seizures and migraine headaches.  He returns today for follow-up.  He continues on Topamax.  Reports that he has not had any seizures.  He tolerates medication well.  On occasion he does notice tingling in the fingertips.  He states that he has 1 headache a week.  His headaches always occur when he wakes up in the morning.  They are always located on the left side.  Denies as  well as nausea and vomiting.  He states that his headache will resolve after the second dose of Maxalt.  He does note that he was diagnosed with sleep apnea but is not on CPAP currently. he has a follow-up with Dr. [name] next week.  He returns today for evaluation.  HISTORY (copied from Dr. Tobey Grim note)Mr. [name] is a 53 year old right-handed white male with a history of seizures that have been under relatively good control.  The patient is still having occasional migraine headaches, 3 to 4 months.  He takes Maxalt if needed for the headache.  He is on Topamax 50 mg 3 times daily for his seizures and for the migraine.  He apparently has had some back issues and has been seen by Dr. Lynann Bologna, EMG and nerve conduction study done through this office was normal involving the left leg.  The patient reports that he drags the left leg, he has pain with walking.  He has not had any falls.  The patient is not sleeping well at night, he was on CPAP but he does not use this anymore.  The patient mainly has issues with insomnia.  He returns the office today for an evaluation   REVIEW OF SYSTEMS: Out of a complete 14 system review of symptoms, the patient complains only of the following symptoms, and all other reviewed systems are negative.  Blurred vision, hearing loss  ALLERGIES: Allergies  Allergen Reactions  . Aspirin     GI  upset Other reaction(s): Vomiting    HOME MEDICATIONS: Outpatient Medications Prior to Visit  Medication Sig Dispense Refill  . baclofen (LIORESAL) 10 MG tablet Take 10 mg by mouth 2 (two) times daily.  0  . diclofenac sodium (VOLTAREN) 1 % GEL Apply topically as directed. 1 Tube 1  . nortriptyline (PAMELOR) 10 MG capsule TAKE 1 CAPSULE AT NIGHT FOR ONE WEEK, THEN TAKE 2 CAPSULES AT NIGHT FOR ONE WEEK, THEN TAKE 3 CAPSULES AT NIGHT 270 capsule 1  . rizatriptan (MAXALT) 10 MG tablet Take 1 tablet by mouth at onset of migraine. May repeat in 2 hours if needed. May 2 per day. 10 tablet 0  . rOPINIRole (REQUIP) 0.25 MG tablet TAKE 1 TO 2 TABLETS AT BEDTIME 180 tablet 1  . simvastatin (ZOCOR) 20 MG tablet Take 1 tablet (20 mg total) by mouth at bedtime. 90 tablet 1  . topiramate (TOPAMAX) 50 MG tablet Take one tablet in the AM and two tablets in the PM. 270 tablet 3  . traMADol (ULTRAM) 50 MG tablet      No facility-administered medications prior to visit.     PAST MEDICAL HISTORY: Past Medical History:  Diagnosis Date  . Arthritis   . Baker's cyst   . Headache(784.0)   . Knee pain   . Low back pain   . Migraines   . OSA (obstructive  sleep apnea) 04/10/2017  . Personality disorder ([place])   . Seizures ([place])     PAST SURGICAL HISTORY: Past Surgical History:  Procedure Laterality Date  . TOOTH EXTRACTION  12/2014   lower left    FAMILY HISTORY: Family History  Problem Relation Age of Onset  . Heart disease Father        defibrilator  . Hyperlipidemia Father   . Hypertension Father   . Alzheimer's disease Father   . Other Father        stents in legs  . Aneurysm Mother        brain  . Hypertension Brother   . Stroke Brother        X2    SOCIAL HISTORY: Social History   Socioeconomic History  . Marital status: Divorced    Spouse name: Not on file  . Number of children: Not on file  . Years of education: Not on file  . Highest education level: Not on file   Occupational History  . Occupation: disabled  Social Needs  . Financial resource strain: Not on file  . Food insecurity:    Worry: Not on file    Inability: Not on file  . Transportation needs:    Medical: Not on file    Non-medical: Not on file  Tobacco Use  . Smoking status: Former Smoker    Last attempt to quit: 04/26/1990    Years since quitting: 27.7  . Smokeless tobacco: Never Used  . Tobacco comment: stopped in 1992; very smoking   Substance and Sexual Activity  . Alcohol use: No  . Drug use: No  . Sexual activity: Yes  Lifestyle  . Physical activity:    Days per week: Not on file    Minutes per session: Not on file  . Stress: Not on file  Relationships  . Social connections:    Talks on phone: Not on file    Gets together: Not on file    Attends religious service: Not on file    Active member of club or organization: Not on file    Attends meetings of clubs or organizations: Not on file    Relationship status: Not on file  . Intimate partner violence:    Fear of current or ex partner: Not on file    Emotionally abused: Not on file    Physically abused: Not on file    Forced sexual activity: Not on file  Other Topics Concern  . Not on file  Social History Narrative   Lives   Caffeine use:      PHYSICAL EXAM  Vitals:   01/21/18 1321  BP: 118/62  Pulse: (!) 52  Weight: 253 lb (114.8 kg)  Height: 6\' 2"  (1.88 m)   Body mass index is 32.48 kg/m.  Generalized: Well developed, in no acute distress   Neurological examination  Mentation: Alert oriented to time, place, history taking. Follows all commands speech and language fluent Cranial nerve II-XII: Pupils were equal round reactive to light. Extraocular movements were full, visual field were full on confrontational test. Facial sensation and strength were normal. Uvula tongue midline. Head turning and shoulder shrug  were normal and symmetric. Motor: The motor testing reveals 5 over 5 strength of all 4  extremities. Good symmetric motor tone is noted throughout.  Sensory: Sensory testing is intact to soft touch on all 4 extremities. No evidence of extinction is noted.  Coordination: Cerebellar testing reveals good finger-nose-finger and heel-to-shin bilaterally.  Gait and station: Gait is normal.  Reflexes: Deep tendon reflexes are symmetric and normal bilaterally.   DIAGNOSTIC DATA (LABS, IMAGING, TESTING) - I reviewed patient records, labs, notes, testing and imaging myself where available.  Lab Results  Component Value Date   WBC 7.5 02/21/2016   HGB 14.7 02/21/2016   HCT 42.1 02/21/2016   MCV 90.9 02/21/2016   PLT 162.0 02/21/2016      Component Value Date/Time   NA 140 09/30/2017 1019   NA 140 01/19/2014 1041   K 4.9 09/30/2017 1019   CL 108 09/30/2017 1019   CO2 24 09/30/2017 1019   GLUCOSE 92 09/30/2017 1019   BUN 19 09/30/2017 1019   BUN 18 01/19/2014 1041   CREATININE 0.97 09/30/2017 1019   CALCIUM 9.6 09/30/2017 1019   PROT 6.3 02/21/2016 0907   PROT 7.0 01/19/2014 1041   ALBUMIN 4.1 02/21/2016 0907   ALBUMIN 4.7 01/19/2014 1041   AST 21 02/21/2016 0907   ALT 21 02/21/2016 0907   ALKPHOS 78 02/21/2016 0907   BILITOT 0.4 02/21/2016 0907   GFRNONAA 103 01/19/2014 1041   GFRAA 119 01/19/2014 1041   Lab Results  Component Value Date   CHOL 174 09/30/2017   HDL 23.60 (L) 09/30/2017   LDLCALC 74 10/16/2016   LDLDIRECT 107.0 09/30/2017   TRIG 326.0 (H) 09/30/2017   CHOLHDL 7 09/30/2017   No results found for: HGBA1C Lab Results  Component Value Date   VITAMINB12 518 10/16/2016   Lab Results  Component Value Date   TSH 2.51 03/17/2017      ASSESSMENT AND PLAN 52 y.o. year old male  has a past medical history of Arthritis, Baker's cyst, Headache(784.0), Knee pain, Low back pain, Migraines, OSA (obstructive sleep apnea) (04/10/2017), Personality disorder ([place]), and Seizures ([place]). here with :  1.  Migraine headaches 2.  Seizures  Overall the patient  is doing well in regards to his seizures.  He would continue on Topamax.  Will continue on nortriptyline for his migraines.  His headaches could also be triggered by untreated sleep apnea.  He has a follow-up next week with Dr. [name].  He is advised that if his symptoms worsen or he develops new patient let us know.   Ward Givens, MSN, NP-C 01/21/2018, 1:55 PM [facility] [address] [place], [place] [address] [phone]

## 2018-01-21 NOTE — Patient Instructions (Signed)
Your Plan:  Continue Topamax and Nortripytline Follow-up with Dr. [name] If your symptoms worsen or you develop new symptoms please let us know.   Thank you for coming to see [place] at [facility]. I hope we have been able to provide you high quality care today.  You may receive a patient satisfaction survey over the next few weeks. We would appreciate your feedback and comments so that we may continue to improve ourselves and the health of our patients.

## 2018-01-23 DIAGNOSIS — M25552 Pain in left hip: Secondary | ICD-10-CM | POA: Diagnosis not present

## 2018-01-23 DIAGNOSIS — M545 Low back pain: Secondary | ICD-10-CM | POA: Diagnosis not present

## 2018-01-23 DIAGNOSIS — G894 Chronic pain syndrome: Secondary | ICD-10-CM | POA: Diagnosis not present

## 2018-01-23 DIAGNOSIS — M25561 Pain in right knee: Secondary | ICD-10-CM | POA: Diagnosis not present

## 2018-01-27 DIAGNOSIS — M25552 Pain in left hip: Secondary | ICD-10-CM | POA: Diagnosis not present

## 2018-01-27 DIAGNOSIS — M25561 Pain in right knee: Secondary | ICD-10-CM | POA: Diagnosis not present

## 2018-01-27 DIAGNOSIS — M545 Low back pain: Secondary | ICD-10-CM | POA: Diagnosis not present

## 2018-01-27 DIAGNOSIS — G894 Chronic pain syndrome: Secondary | ICD-10-CM | POA: Diagnosis not present

## 2018-01-28 ENCOUNTER — Ambulatory Visit (INDEPENDENT_AMBULATORY_CARE_PROVIDER_SITE_OTHER): Payer: Medicare HMO | Admitting: Pulmonary Disease

## 2018-01-28 ENCOUNTER — Encounter: Payer: Self-pay | Admitting: Pulmonary Disease

## 2018-01-28 VITALS — BP 114/70 | HR 40 | Ht 74.0 in | Wt 257.0 lb

## 2018-01-28 DIAGNOSIS — G4733 Obstructive sleep apnea (adult) (pediatric): Secondary | ICD-10-CM | POA: Diagnosis not present

## 2018-01-28 DIAGNOSIS — R001 Bradycardia, unspecified: Secondary | ICD-10-CM | POA: Diagnosis not present

## 2018-01-28 NOTE — Progress Notes (Signed)
[facility]  Chief Complaint  Patient presents with  . Follow-up    Pt is not using the cpap machine, he does not have the machine anymore because he was not complaint.     Constitutional:  BP 114/70 (BP Location: Left Arm, Cuff Size: Normal)   Pulse (!) 40   Ht 6\' 2"  (1.88 m)   Wt 257 lb (116.6 kg)   SpO2 97%   BMI 33.00 kg/m   Past Medical History:  HA, Low back pain, Migraine HA, Seizures, Personality disorder  Brief Summary:  Steven Vincent is a 52 y.o. male with obstructive sleep apnea.  He has been feeling more light-headed.  He was seen by cardiology earlier in the year for chest pain.  He wasn't having an issue with low heart rate at that time.  More recently his heart rate has been running in the 40's.    He no longer has his CPAP.  He was told he wasn't compliant enough and would have to pay out of pocket.  He was having trouble with back pain and this made it difficulty for him to stay asleep.  He felt that CPAP helped some.  He is very tired during the day.  He is snoring still.  Physical Exam:   Appearance - well kempt   ENMT - clear nasal mucosa, midline nasal  septum, no oral exudates, no LAN, trachea midline, poor dentition  Respiratory - normal chest wall, normal respiratory effort, no accessory muscle use, no wheeze/rales  CV - regular, bradycardic, no murmur, no peripheral edema, radial pulses symmetric  GI - soft, non tender, no masses  Lymph - no adenopathy noted in neck and axillary areas  MSK - normal gait  Ext - no cyanosis, clubbing, or joint inflammation noted  Skin - no rashes, lesions, or ulcers  Neuro - normal strength, oriented x 3  Psych - normal mood and affect  ECG - frequent PVCs   Assessment/Plan:   Obstructive sleep apnea. - will repeat home sleep study and then determine if he can get CPAP set up again - he will continue to sleep on a wedge pillow  Insomnia. - he is  prescribed nortriptyline from his neurologist  Bradycardia with pre-syncope. - will arrange for him to be referred back to cardiology   Patient Instructions  Will arrange for appointment with cardiology  Will arrange for home sleep study  Follow up in 3 months    [name], MD [place] Pager: [phone] 01/28/2018, 3:01 PM  Flow Sheet    Sleep tests:  PSG 02/15/17 >>AHI 6.9, SpO2 low 85%. Supine AHI 10.5, REM AHI 24.3. Auto CPAP 05/06/17 to 06/04/17 >> used on 28 of 30 nights with average 4 hrs 19 min.  Average AHI 3.4 with median CPAP 9 and 95 th percentile 12 cm H2O  Cardiac tests:  Echo 01/09/17 >> EF 55 to 60%  Medications:   Allergies as of 01/28/2018      Reactions   Aspirin    GI upset Other reaction(s): Vomiting      Medication List        Accurate as of 01/28/18  3:01 PM. Always use your most recent med list.          baclofen 10 MG tablet Commonly known as:  LIORESAL Take 10 mg by mouth 2 (two) times daily.   diclofenac sodium 1 % Gel Commonly known as:  VOLTAREN Apply topically as  directed.   nortriptyline 10 MG capsule Commonly known as:  PAMELOR TAKE 1 CAPSULE AT NIGHT FOR ONE WEEK, THEN TAKE 2 CAPSULES AT NIGHT FOR ONE WEEK, THEN TAKE 3 CAPSULES AT NIGHT   rizatriptan 10 MG tablet Commonly known as:  MAXALT Take 1 tablet by mouth at onset of migraine. May repeat in 2 hours if needed. May 2 per day.   rOPINIRole 0.25 MG tablet Commonly known as:  REQUIP TAKE 1 TO 2 TABLETS AT BEDTIME   simvastatin 20 MG tablet Commonly known as:  ZOCOR Take 1 tablet (20 mg total) by mouth at bedtime.   topiramate 50 MG tablet Commonly known as:  TOPAMAX Take one tablet in the AM and two tablets in the PM.   traMADol 50 MG tablet Commonly known as:  ULTRAM       Past Surgical History:  He  has a past surgical history that includes Tooth extraction (12/2014).  Family History:  His family history includes  Alzheimer's disease in his father; Aneurysm in his mother; Heart disease in his father; Hyperlipidemia in his father; Hypertension in his brother and father; Other in his father; Stroke in his brother.  Social History:  He  reports that he quit smoking about 27 years ago. He has never used smokeless tobacco. He reports that he does not drink alcohol or use drugs.

## 2018-01-28 NOTE — Patient Instructions (Signed)
Will arrange for appointment with cardiology  Will arrange for home sleep study  Follow up in 3 months

## 2018-02-02 NOTE — Progress Notes (Signed)
Cardiology Office Note   Date:  02/06/2018   ID:  Steven Vincent, DOB Mar 03, 1965, MRN [id number]  PCP:  Martinique, Betty G, MD  Cardiologist:   Minus Breeding, MD    Chief Complaint  Patient presents with  . Palpitations      History of Present Illness: Steven Vincent is a 52 y.o. male who presents for evaluation of possible syncope.   The events surrounding this did not confirm that this was in fact syncope.  He had a negative POET (Plain Old Exercise Treadmill) .   He had a Holter with PVCs with bigeminy, trigeminy and a triplet.   An echo was unremarkable.   A sleep study was positive for sleep apnea.  During that study he was found to have a HR in the 40s.  His beta blocker was reduced.   It was suspected that his decreased HR was related to undercounting PVCs.   Subsequently his beta blocker was stopped.    Unfortunately he has not been feeling well.  He never got CPAP for his sleep apnea.  He is tired and falls asleep easily.  He says he "goes out" but has not had frank syncope.  He was referred back here because of his bradycardia and these episodes.  However, he has had evaluation and his bradycardia which is sometimes recorded as a heart rate in the 30s is clearly because his PVCs are under counted.  He has had no clear correlation between his PVCs and any episodes of syncope or presyncope.  He is trying to find work.  He finds difficulties in this because he has to lift and carry and is not supposed to with some back problems.  He has had an extensive work-up of symptoms like shortness of breath and presyncope.  He had a Holter over a year ago with results as above.  He is had a negative perfusion study this year.  He had an echocardiogram which was essentially unremarkable.    Since he was last seen he has had his beta-blocker completely stopped.  He does feel the PVCs but he has had no further syncope.  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  It some moderate discomfort in his mid chest that he cannot quantify or qualify it more than to say it hurts.  It lasts for about 20 minutes after he stops what he is doing.  He does not have any of this at rest.  He is not having any new shortness of breath, PND or orthopnea.  He has been diagnosed with some back problems and has some leg weakness in his left foot.  This has limited his activity somewhat.  Past Medical History:  Diagnosis Date  . Arthritis   . Baker's cyst   . Headache(784.0)   . Knee pain   . Low back pain   . Migraines   . OSA (obstructive sleep apnea) 04/10/2017  . Personality disorder ([place])   . Seizures ([place])     Past Surgical History:  Procedure Laterality Date  . TOOTH EXTRACTION  12/2014   lower left     Current Outpatient Medications  Medication Sig Dispense Refill  . baclofen (LIORESAL) 10 MG tablet Take 10 mg by mouth 2 (two) times daily.  0  . diclofenac sodium (VOLTAREN) 1 % GEL Apply topically as directed. 1 Tube 1  . nortriptyline (PAMELOR) 10 MG capsule TAKE 1 CAPSULE  AT NIGHT FOR ONE WEEK, THEN TAKE 2 CAPSULES AT NIGHT FOR ONE WEEK, THEN TAKE 3 CAPSULES AT NIGHT 270 capsule 1  . rizatriptan (MAXALT) 10 MG tablet Take 1 tablet by mouth at onset of migraine. May repeat in 2 hours if needed. May 2 per day. 10 tablet 0  . rOPINIRole (REQUIP) 0.25 MG tablet TAKE 1 TO 2 TABLETS AT BEDTIME 180 tablet 1  . simvastatin (ZOCOR) 20 MG tablet Take 1 tablet (20 mg total) by mouth at bedtime. 90 tablet 1  . topiramate (TOPAMAX) 50 MG tablet Take one tablet in the AM and two tablets in the PM. 270 tablet 3  . traMADol (ULTRAM) 50 MG tablet      No current facility-administered medications for this visit.     Allergies:   Aspirin    ROS:  Please see the history of present illness.   Otherwise, review of systems are positive for none.  All other systems are reviewed and negative.    PHYSICAL EXAM: VS:  BP 130/80   Pulse 84   Ht 6\' 2"  (1.88 m)    Wt 257 lb 6.4 oz (116.8 kg)   BMI 33.05 kg/m  , BMI Body mass index is 33.05 kg/m.  GENERAL:  Well appearing NECK:  No jugular venous distention, waveform within normal limits, carotid upstroke brisk and symmetric, no bruits, no thyromegaly LUNGS:  Clear to auscultation bilaterally CHEST:  Unremarkable HEART:  PMI not displaced or sustained,S1 and S2 within normal limits, no S3, no S4, no clicks, no rubs, no murmurs ABD:  Flat, positive bowel sounds normal in frequency in pitch, no bruits, no rebound, no guarding, no midline pulsatile mass, no hepatomegaly, no splenomegaly EXT:  2 plus pulses throughout, no edema, no cyanosis no clubbing   EKG:  EKG is  ordered today. Sinus rhythm, rate 84, premature ventricular contractions, ventricular couplets, left axis deviation  Recent Labs: 03/17/2017: TSH 2.51 09/30/2017: BUN 19; Creatinine, Ser 0.97; Potassium 4.9; Sodium 140    Lipid Panel    Component Value Date/Time   CHOL 174 09/30/2017 1019   TRIG 326.0 (H) 09/30/2017 1019   TRIG 231 (HH) 12/27/2005 1005   HDL 23.60 (L) 09/30/2017 1019   CHOLHDL 7 09/30/2017 1019   VLDL 65.2 (H) 09/30/2017 1019   LDLCALC 74 10/16/2016 0900   LDLDIRECT 107.0 09/30/2017 1019      Wt Readings from Last 3 Encounters:  02/06/18 257 lb 6.4 oz (116.8 kg)  01/28/18 257 lb (116.6 kg)  01/21/18 253 lb (114.8 kg)      Other studies Reviewed: Additional studies/ records that were reviewed today include:  None Review of the above records demonstrates:       ASSESSMENT AND PLAN:  PVCs:     At this point I do not think he has any symptomatic bradycardia arrhythmias and on his monitor the longest run of NSVT was 4 beats.  However, that was over a year ago.  I will repeat a 24-hour Holter to see if anything is changed.   QUESTIONABLE SYNCOPE:   He has had no clear frank syncope.  He is had an extensive evaluation as above.  He is due to have another sleep study and hopefully he can if necessary get  CPAP.   CHEST PAIN:    He had no ischemia on his Lexiscan Myoview.  No further work-up is suggested  SLEEP APNEA:   He is on CPAP.    Current medicines are reviewed at length  with the patient today.  The patient does not have concerns regarding medicines.  The following changes have been made:  None  Labs/ tests ordered today include:     Orders Placed This Encounter  Procedures  . HOLTER MONITOR - 24 HOUR  . EKG 12-Lead     Disposition:   FU me in six months.    Signed, Minus Breeding, MD  02/06/2018 12:14 PM    [facility]

## 2018-02-04 DIAGNOSIS — M545 Low back pain: Secondary | ICD-10-CM | POA: Diagnosis not present

## 2018-02-04 DIAGNOSIS — M62551 Muscle wasting and atrophy, not elsewhere classified, right thigh: Secondary | ICD-10-CM | POA: Diagnosis not present

## 2018-02-04 DIAGNOSIS — M2560 Stiffness of unspecified joint, not elsewhere classified: Secondary | ICD-10-CM | POA: Diagnosis not present

## 2018-02-04 DIAGNOSIS — M549 Dorsalgia, unspecified: Secondary | ICD-10-CM | POA: Diagnosis not present

## 2018-02-05 ENCOUNTER — Telehealth: Payer: Self-pay | Admitting: Pulmonary Disease

## 2018-02-05 DIAGNOSIS — M549 Dorsalgia, unspecified: Secondary | ICD-10-CM | POA: Diagnosis not present

## 2018-02-05 DIAGNOSIS — M2560 Stiffness of unspecified joint, not elsewhere classified: Secondary | ICD-10-CM | POA: Diagnosis not present

## 2018-02-05 DIAGNOSIS — M545 Low back pain: Secondary | ICD-10-CM | POA: Diagnosis not present

## 2018-02-05 DIAGNOSIS — M62551 Muscle wasting and atrophy, not elsewhere classified, right thigh: Secondary | ICD-10-CM | POA: Diagnosis not present

## 2018-02-05 NOTE — Telephone Encounter (Signed)
After reviewing the chart, I could not find any encounters on a telephone call. The only thing I could think of is Dr. [name] ordered home sleep test so maybe the PCC's called to set up an appt for pickup. PCC's did someone call pt?

## 2018-02-05 NOTE — Telephone Encounter (Signed)
I had called pt & left him my direct # to call me back to set up hst.  I have just called him again and left him another vm to call me.

## 2018-02-06 ENCOUNTER — Encounter: Payer: Self-pay | Admitting: Cardiology

## 2018-02-06 ENCOUNTER — Ambulatory Visit (INDEPENDENT_AMBULATORY_CARE_PROVIDER_SITE_OTHER): Payer: Medicare HMO | Admitting: Cardiology

## 2018-02-06 VITALS — BP 130/80 | HR 84 | Ht 74.0 in | Wt 257.4 lb

## 2018-02-06 DIAGNOSIS — R001 Bradycardia, unspecified: Secondary | ICD-10-CM

## 2018-02-06 DIAGNOSIS — I493 Ventricular premature depolarization: Secondary | ICD-10-CM | POA: Diagnosis not present

## 2018-02-06 NOTE — Patient Instructions (Signed)
Medication Instructions:  Continue current medications  If you need a refill on your cardiac medications before your next appointment, please call your pharmacy.  Labwork: None Ordered    If you have labs (blood work) drawn today and your tests are completely normal, you will receive your results only by [employer] (if you have MyChart) -OR- A paper copy in the mail.  If you have any lab test that is abnormal or we need to change your treatment, we will call you to review these results.  Testing/Procedures: Your physician has recommended that you wear a 24 hour holter monitor. Holter monitors are medical devices that record the heart's electrical activity. Doctors most often use these monitors to diagnose arrhythmias. Arrhythmias are problems with the speed or rhythm of the heartbeat. The monitor is a small, portable device. You can wear one while you do your normal daily activities. This is usually used to diagnose what is causing palpitations/syncope (passing out).  Follow-Up: You will need a follow up appointment in 6 months.  Please call our office 2 months in advance to schedule this appointment.  You may see Dr Percival Spanish or one of the following Advanced Practice Providers on your designated Care Team:   Rosaria Ferries, PA-C . Jory Sims, DNP, ANP    At [facility], you and your health needs are our priority.  As part of our continuing mission to provide you with exceptional heart care, we have created designated Provider Care Teams.  These Care Teams include your primary Cardiologist (physician) and Advanced Practice Providers (APPs -  Physician Assistants and Nurse Practitioners) who all work together to provide you with the care you need, when you need it.

## 2018-02-06 NOTE — Telephone Encounter (Signed)
I have spoken to pt & have him scheduled for home sleep study.  Nothing further needed.

## 2018-02-09 DIAGNOSIS — M62551 Muscle wasting and atrophy, not elsewhere classified, right thigh: Secondary | ICD-10-CM | POA: Diagnosis not present

## 2018-02-09 DIAGNOSIS — M549 Dorsalgia, unspecified: Secondary | ICD-10-CM | POA: Diagnosis not present

## 2018-02-09 DIAGNOSIS — M2560 Stiffness of unspecified joint, not elsewhere classified: Secondary | ICD-10-CM | POA: Diagnosis not present

## 2018-02-09 DIAGNOSIS — M545 Low back pain: Secondary | ICD-10-CM | POA: Diagnosis not present

## 2018-02-10 DIAGNOSIS — G894 Chronic pain syndrome: Secondary | ICD-10-CM | POA: Diagnosis not present

## 2018-02-10 DIAGNOSIS — M47816 Spondylosis without myelopathy or radiculopathy, lumbar region: Secondary | ICD-10-CM | POA: Diagnosis not present

## 2018-02-12 DIAGNOSIS — M549 Dorsalgia, unspecified: Secondary | ICD-10-CM | POA: Diagnosis not present

## 2018-02-12 DIAGNOSIS — M2560 Stiffness of unspecified joint, not elsewhere classified: Secondary | ICD-10-CM | POA: Diagnosis not present

## 2018-02-12 DIAGNOSIS — M545 Low back pain: Secondary | ICD-10-CM | POA: Diagnosis not present

## 2018-02-12 DIAGNOSIS — M62551 Muscle wasting and atrophy, not elsewhere classified, right thigh: Secondary | ICD-10-CM | POA: Diagnosis not present

## 2018-02-16 ENCOUNTER — Ambulatory Visit (INDEPENDENT_AMBULATORY_CARE_PROVIDER_SITE_OTHER): Payer: Medicare HMO

## 2018-02-16 DIAGNOSIS — I493 Ventricular premature depolarization: Secondary | ICD-10-CM

## 2018-02-16 DIAGNOSIS — M545 Low back pain: Secondary | ICD-10-CM | POA: Diagnosis not present

## 2018-02-16 DIAGNOSIS — M170 Bilateral primary osteoarthritis of knee: Secondary | ICD-10-CM | POA: Diagnosis not present

## 2018-02-16 DIAGNOSIS — M2560 Stiffness of unspecified joint, not elsewhere classified: Secondary | ICD-10-CM | POA: Diagnosis not present

## 2018-02-16 DIAGNOSIS — M62551 Muscle wasting and atrophy, not elsewhere classified, right thigh: Secondary | ICD-10-CM | POA: Diagnosis not present

## 2018-02-16 DIAGNOSIS — M549 Dorsalgia, unspecified: Secondary | ICD-10-CM | POA: Diagnosis not present

## 2018-02-17 ENCOUNTER — Other Ambulatory Visit: Payer: Self-pay | Admitting: Family Medicine

## 2018-02-17 DIAGNOSIS — F2089 Other schizophrenia: Secondary | ICD-10-CM | POA: Diagnosis not present

## 2018-02-19 ENCOUNTER — Other Ambulatory Visit: Payer: Self-pay | Admitting: Neurology

## 2018-02-19 DIAGNOSIS — M545 Low back pain: Secondary | ICD-10-CM | POA: Diagnosis not present

## 2018-02-19 DIAGNOSIS — M549 Dorsalgia, unspecified: Secondary | ICD-10-CM | POA: Diagnosis not present

## 2018-02-19 DIAGNOSIS — M62551 Muscle wasting and atrophy, not elsewhere classified, right thigh: Secondary | ICD-10-CM | POA: Diagnosis not present

## 2018-02-19 DIAGNOSIS — M2560 Stiffness of unspecified joint, not elsewhere classified: Secondary | ICD-10-CM | POA: Diagnosis not present

## 2018-02-20 ENCOUNTER — Other Ambulatory Visit: Payer: Self-pay

## 2018-02-20 MED ORDER — RIZATRIPTAN BENZOATE 10 MG PO TABS: ORAL_TABLET | ORAL | 0 refills | Status: DC

## 2018-02-23 ENCOUNTER — Telehealth: Payer: Self-pay | Admitting: Cardiology

## 2018-02-23 NOTE — Telephone Encounter (Signed)
  Patient is calling for results of event monitor. Patient goes to work at [employer].

## 2018-02-24 NOTE — Telephone Encounter (Signed)
Detailed message with result left on pt voicemail(DPR)

## 2018-02-26 DIAGNOSIS — G4733 Obstructive sleep apnea (adult) (pediatric): Secondary | ICD-10-CM | POA: Diagnosis not present

## 2018-03-02 ENCOUNTER — Other Ambulatory Visit: Payer: Self-pay | Admitting: *Deleted

## 2018-03-02 DIAGNOSIS — M549 Dorsalgia, unspecified: Secondary | ICD-10-CM | POA: Diagnosis not present

## 2018-03-02 DIAGNOSIS — G4733 Obstructive sleep apnea (adult) (pediatric): Secondary | ICD-10-CM

## 2018-03-02 DIAGNOSIS — M62551 Muscle wasting and atrophy, not elsewhere classified, right thigh: Secondary | ICD-10-CM | POA: Diagnosis not present

## 2018-03-02 DIAGNOSIS — M545 Low back pain: Secondary | ICD-10-CM | POA: Diagnosis not present

## 2018-03-02 DIAGNOSIS — M2560 Stiffness of unspecified joint, not elsewhere classified: Secondary | ICD-10-CM | POA: Diagnosis not present

## 2018-03-02 DIAGNOSIS — M170 Bilateral primary osteoarthritis of knee: Secondary | ICD-10-CM | POA: Diagnosis not present

## 2018-03-03 ENCOUNTER — Telehealth: Payer: Self-pay | Admitting: Pulmonary Disease

## 2018-03-03 DIAGNOSIS — G4733 Obstructive sleep apnea (adult) (pediatric): Secondary | ICD-10-CM | POA: Diagnosis not present

## 2018-03-03 NOTE — Telephone Encounter (Signed)
I spoke with pt, and advised him to call back around 1/16 to check if results are back. I informed him that HST takes about 2 weeks to come back. Pt understood and nothing further is needed.

## 2018-03-03 NOTE — Telephone Encounter (Signed)
ATC pt, no answer. Left message for pt to call back.  

## 2018-03-03 NOTE — Telephone Encounter (Signed)
Patient returned call, CB is [phone].

## 2018-03-03 NOTE — Telephone Encounter (Signed)
Called patient, unable to reach. Left message to give us a call back.  

## 2018-03-04 ENCOUNTER — Telehealth: Payer: Self-pay | Admitting: Pulmonary Disease

## 2018-03-04 DIAGNOSIS — F2089 Other schizophrenia: Secondary | ICD-10-CM | POA: Diagnosis not present

## 2018-03-04 NOTE — Telephone Encounter (Signed)
HST 02/26/18 >> AHI 11.6, SpO2 low 79%   Please let him know that the sleep study showed mild obstructive sleep apnea.  Please arrange for ROV with a nurse practitioner to review treatment options.

## 2018-03-05 ENCOUNTER — Telehealth: Payer: Self-pay | Admitting: Cardiology

## 2018-03-05 DIAGNOSIS — M2560 Stiffness of unspecified joint, not elsewhere classified: Secondary | ICD-10-CM | POA: Diagnosis not present

## 2018-03-05 DIAGNOSIS — M549 Dorsalgia, unspecified: Secondary | ICD-10-CM | POA: Diagnosis not present

## 2018-03-05 DIAGNOSIS — M62551 Muscle wasting and atrophy, not elsewhere classified, right thigh: Secondary | ICD-10-CM | POA: Diagnosis not present

## 2018-03-05 DIAGNOSIS — M545 Low back pain: Secondary | ICD-10-CM | POA: Diagnosis not present

## 2018-03-05 NOTE — Telephone Encounter (Signed)
Patient walk-in : Patient came into [facility]. Patient wants clarification regarding medication. Patient also has question regarding results of sleep study. Patient said home phone number would be best contact number due to cell phone may not be charged.

## 2018-03-05 NOTE — Telephone Encounter (Signed)
Attempted to call patient today regarding results. I did not receive an answer at time of call. I have left a voicemail message for pt to return call. X1  

## 2018-03-06 ENCOUNTER — Encounter: Payer: Self-pay | Admitting: Family Medicine

## 2018-03-06 ENCOUNTER — Ambulatory Visit: Payer: Medicare HMO

## 2018-03-06 ENCOUNTER — Ambulatory Visit (INDEPENDENT_AMBULATORY_CARE_PROVIDER_SITE_OTHER): Payer: Medicare HMO | Admitting: Family Medicine

## 2018-03-06 VITALS — BP 124/82 | HR 60 | Temp 97.8°F | Resp 12 | Ht 74.0 in | Wt 252.4 lb

## 2018-03-06 DIAGNOSIS — M79601 Pain in right arm: Secondary | ICD-10-CM

## 2018-03-06 DIAGNOSIS — R001 Bradycardia, unspecified: Secondary | ICD-10-CM | POA: Diagnosis not present

## 2018-03-06 DIAGNOSIS — E6609 Other obesity due to excess calories: Secondary | ICD-10-CM

## 2018-03-06 DIAGNOSIS — Z6832 Body mass index (BMI) 32.0-32.9, adult: Secondary | ICD-10-CM

## 2018-03-06 DIAGNOSIS — M5410 Radiculopathy, site unspecified: Secondary | ICD-10-CM

## 2018-03-06 DIAGNOSIS — G4700 Insomnia, unspecified: Secondary | ICD-10-CM | POA: Diagnosis not present

## 2018-03-06 DIAGNOSIS — R42 Dizziness and giddiness: Secondary | ICD-10-CM | POA: Diagnosis not present

## 2018-03-06 NOTE — Patient Instructions (Signed)
A few things to remember from today's visit:   Bradycardia  Class 1 obesity due to excess calories with serious comorbidity and body mass index (BMI) of 30.0 to 30.9 in adult  Radicular pain of left lower extremity  Arm pain, anterior, right  For now continue with no medication. Heart rate is now above 60/min. Continue monitoring heart rate at home.  Continue following with neurologist for insomnia.  Try to wear CPAP every night when you get it.  You can use Voltaren gel for arm pain. Continue following with pain management.  Please be sure medication list is accurate. If a new problem present, please set up appointment sooner than planned today.

## 2018-03-06 NOTE — Telephone Encounter (Signed)
Called and spoke with patient regarding results.  Informed the patient of results and recommendations today. Scheduled pt with B Mack for 03/09/2018 at 9am Pt verbalized understanding and denied any questions or concerns at this time.  Nothing further needed.

## 2018-03-06 NOTE — Assessment & Plan Note (Signed)
Since beta-blocker was discontinued problem has resolved. Recommend continuing checking HR regularly. Instructed about warning signs.

## 2018-03-06 NOTE — Progress Notes (Signed)
HPI:   [name] is a 53 y.o. male, who is here today for chronic problem management.  He was last seen on 11/12/2016 for his CPE.  Since he is last visit he has followed with pulmonologist and cardiologist.  Right elbow pain since 07/2016. According to patient, he has discussed problem with orthopedist, Dx with OA. 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 Right handed.  Pain is constant, moderate most of the time. No associated numbness or tingling. Negative local edema or erythema.  Insomnia: He is sleeping better with nortriptyline 30 mg daily. Sleeping 8 hours. Still feels fatigue and not rested first thing in the morning, recently had sleep study and results are pending. According to pt,he one before and CPAP was recommended. Because he was not wearing CPAP as recommended he had to return it. States that she could not wear it because he was not sleeping.  He has gained some weight, he thinks is related to nortriptyline. He is not exercising regularly but he is active at work.  He thinks he is following a healthful diet.   Chronic pain: Frustrated because he is having difficulty getting medication recommended by pain management. He is still waiting on a "patch" for his back pain. Lower back pain radiated to left lower extremity. He had "flare ups once ion a while."  Pain is exacerbated by prolonged standing/walking and by some activities at work.   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 He had some mid chest discomfort. Denies palpitations or diaphoresis. No associated involuntary movement,urine/bowl incontinence.   He has had dizziness intermittently for a while  Hx of cardiac arrhythmia, BB was discontinued because bradycardia. He follows with Dr Percival Spanish,  cardiologist. Stress test 05/2017, Holter monitor 11/2016 and12/2019   Review of Systems  Constitutional: Positive for fatigue. Negative for activity change, appetite change and fever.  HENT: Negative for nosebleeds and sore throat.   Eyes: Negative for redness and visual disturbance.  Respiratory: Positive for apnea. Negative for cough, shortness of breath and wheezing.   Cardiovascular: Negative for chest pain, palpitations and leg swelling.  Gastrointestinal: Negative for abdominal pain, nausea and vomiting.  Genitourinary: Negative for decreased urine volume, dysuria and hematuria.  Musculoskeletal: Positive for arthralgias, back pain and gait problem.  Skin: Negative for pallor and rash.  Neurological: Positive for dizziness. Negative for seizures, syncope, weakness and headaches.  Psychiatric/Behavioral: Positive for sleep disturbance. Negative for confusion. The patient is nervous/anxious.      Current Outpatient Medications on File Prior to Visit  Medication Sig Dispense Refill  . baclofen (LIORESAL) 10 MG tablet Take 10 mg by mouth 2 (two) times daily.  0  . diclofenac sodium (VOLTAREN) 1 % GEL Apply topically as directed. 1 Tube 1  . nortriptyline (PAMELOR) 10 MG capsule TAKE 1 CAPSULE AT NIGHT FOR ONE WEEK, THEN TAKE 2 CAPSULES AT NIGHT FOR ONE WEEK, THEN TAKE 3 CAPSULES AT NIGHT 270 capsule 1  . rizatriptan (MAXALT) 10 MG tablet Take 1 tablet by mouth at onset of migraine. May repeat in 2 hours if needed. May 2 per day. 10 tablet 0  . rOPINIRole (REQUIP) 0.25 MG tablet TAKE 1 TO 2 TABLETS AT BEDTIME 180 tablet 1  . simvastatin (ZOCOR) 20 MG tablet TAKE 1 TABLET AT BEDTIME 90  tablet 1  . topiramate (TOPAMAX) 50 MG tablet Take one tablet in the AM and two tablets in the PM. 270 tablet 3  . traMADol (ULTRAM) 50 MG tablet      No current facility-administered medications on file prior to visit.      Past Medical History:  Diagnosis Date  . Arthritis   . Baker's cyst   .  Headache(784.0)   . Knee pain   . Low back pain   . Migraines   . OSA (obstructive sleep apnea) 04/10/2017  . Personality disorder ([place])   . Seizures ([facility])    Allergies  Allergen Reactions  . Aspirin     GI upset Other reaction(s): Vomiting    Social History   Socioeconomic History  . Marital status: Divorced    Spouse name: Not on file  . Number of children: Not on file  . Years of education: Not on file  . Highest education level: Not on file  Occupational History  . Occupation: disabled  Social Needs  . Financial resource strain: Not on file  . Food insecurity:    Worry: Not on file    Inability: Not on file  . Transportation needs:    Medical: Not on file    Non-medical: Not on file  Tobacco Use  . Smoking status: Former Smoker    Last attempt to quit: 04/26/1990    Years since quitting: 27.8  . Smokeless tobacco: Never Used  . Tobacco comment: stopped in 1992; very smoking   Substance and Sexual Activity  . Alcohol use: No  . Drug use: No  . Sexual activity: Yes  Lifestyle  . Physical activity:    Days per week: Not on file    Minutes per session: Not on file  . Stress: Not on file  Relationships  . Social connections:    Talks on phone: Not on file    Gets together: Not on file    Attends religious service: Not on file    Active member of club or organization: Not on file    Attends meetings of clubs or organizations: Not on file    Relationship status: Not on file  Other Topics Concern  . Not on file  Social History Narrative   Lives   Caffeine use:    Vitals:   03/06/18 0738  BP: 124/82  Pulse: 60  Resp: 12  Temp: 97.8 F (36.6 C)  SpO2: 97%   Body mass index is 32.4 kg/m.   Wt Readings from Last 3 Encounters:  03/06/18 252 lb 6 oz (114.5 kg)  02/06/18 257 lb 6.4 oz (116.8 kg)  01/28/18 257 lb (116.6 kg)   11/12/17 250 Lb 2 Oz.  Physical Exam  Nursing note and vitals reviewed. Constitutional: He is oriented to person, place, and  time. He appears well-developed. No distress.  HENT:  Head: Normocephalic and atraumatic.  Mouth/Throat: Oropharynx is clear and moist and mucous membranes are normal.  Eyes: Pupils are equal, round, and reactive to light. Conjunctivae are normal.  Cardiovascular: Normal rate. An irregular rhythm present.  No murmur heard. Pulses:      Dorsalis pedis pulses are 2+ on the right side and 2+ on the left side.  HR 65/min  Respiratory: Effort normal and breath sounds normal. No respiratory distress.  GI: Soft. He exhibits no mass. There is no hepatomegaly. There is no abdominal tenderness.  Musculoskeletal:        General: No edema.  Right elbow: He exhibits normal range of motion, no swelling and no deformity. Tenderness found.       Arms:     Comments: Pain upon palpation of right arm , ant and right above elbow. No edema or erythema.   Lymphadenopathy:    He has no cervical adenopathy.  Neurological: He is alert and oriented to person, place, and time. He has normal strength. No cranial nerve deficit.  Antalgic gait, not assisted.  Skin: Skin is warm. No rash noted. No erythema.  Psychiatric: He has a normal mood and affect.  Well groomed, good eye contact.       ASSESSMENT AND PLAN:   [name] was seen today for chronic problem management.  No orders of the defined types were placed in this encounter.  Insomnia, unspecified type Well controlled with Nortriptyline. Side effects of med discussed. He tried decreasing dose and did not help. Good sleep hygiene also recommended.  Class 1 obesity due to excess calories with serious comorbidity and body mass index (BMI) of 32.0 to 32.9 in adult Gained about 7 Lb since 10/2017.  We discussed benefits of wt loss as well as adverse effects of obesity. Consistency with healthy diet and physical activity recommended.  Arm pain, anterior, right ? Biceps tendinitis. He had Voltaren gel at home,recommend applying it  on affected area. Continue following with ortho  Bradycardia Since beta-blocker was discontinued problem has resolved. Recommend continuing checking HR regularly. Instructed about warning signs.  Radicular pain of left lower extremity In general problem has been stable.  Fall precautions discussed. Continue following with pain management.  Dizziness Possible etiologies discussed, dehydration,anxiety among some. Fall precautions discussed. Instructed about warning signs.   Return in about 8 months (around 11/19/2018) for cpe.     Steven Vincent G. Martinique, MD  [facility]. [place] office.

## 2018-03-06 NOTE — Assessment & Plan Note (Signed)
In general problem has been stable.  Fall precautions discussed. Continue following with pain management.

## 2018-03-06 NOTE — Telephone Encounter (Signed)
Try call pt back unable to leave message

## 2018-03-08 NOTE — Progress Notes (Signed)
@Patient  ID: Steven Vincent, male    DOB: [birth year], 53 y.o.   MRN: [id number]  Chief Complaint  Patient presents with  . Follow-up    Sleep study results    Referring provider: [place], Betty G, MD  HPI:  53 y.o. male with obstructive sleep apnea.  PMH: HA, Low back pain, Insomnia (managed by [facility]), Migraine HA, Seizures, Personality disorder Smoker/ Smoking History: Former Smoker  Maintenance:  none Pt of: Dr. [name]  03/09/2018  - Visit   53 year old male former smoker presented to our office today to review sleep study results.  Patient had previously been set up with a CPAP after a 2018 study.  This was taken away due to noncompliance.  Patient reports that he was not wearing his CPAP long enough because he was having severe back pain.  This is since resolved.   Patient has a new sleep study in January/03/2018 which revealed an AHI of 11.6 and SPO2 low of 79%.  Patient would like to proceed forward with CPAP start.  Patient does have known bradycardia.  He is followed by Dr. Percival Spanish for this.   Tests:   Sleep tests:  PSG 02/15/17 >>AHI 6.9, SpO2 low 85%. Supine AHI 10.5, REM AHI 24.3. Auto CPAP 05/06/17 to 06/04/17 >> used on 28 of 30 nights with average 4 hrs 19 min.  Average AHI 3.4 with median CPAP 9 and 95 th percentile 12 cm H2O  HST 02/26/18 >> AHI 11.6, SpO2 low 79%  Cardiac tests:  Echo 01/09/17 >> EF 55 to 60%   FENO:  No results found for: NITRICOXIDE  PFT: No flowsheet data found.  Imaging: No results found.    Specialty Problems      Pulmonary Problems   OSA (obstructive sleep apnea)    PSG 02/15/17 >>AHI 6.9, SpO2 low 85%. Supine AHI 10.5, REM AHI 24.3. Auto CPAP 05/06/17 to 06/04/17 >> used on 28 of 30 nights with average 4 hrs 19 min.  Average AHI 3.4 with median CPAP 9 and 95 th percentile 12 cm H2O  HST 02/26/18 >> AHI 11.6, SpO2 low 79%      Allergic rhinitis      Allergies  Allergen Reactions  . Aspirin     GI upset Other  reaction(s): Vomiting    Immunization History  Administered Date(s) Administered  . Influenza Split 12/03/2010  . Influenza Whole 12/04/2006, 11/17/2007, 01/11/2009  . Td 12/04/2006    Past Medical History:  Diagnosis Date  . Arthritis   . Baker's cyst   . Headache(784.0)   . Knee pain   . Low back pain   . Migraines   . OSA (obstructive sleep apnea) 04/10/2017  . Personality disorder ([place])   . Seizures ([place])     Tobacco History: Social History   Tobacco Use  Smoking Status Former Smoker  Smokeless Tobacco Never Used  Tobacco Comment   states he only tried as a teenager   Counseling given: Yes Comment: states he only tried as a teenager  Continue to not smoke  Outpatient Encounter Medications as of 03/09/2018  Medication Sig  . baclofen (LIORESAL) 10 MG tablet Take 10 mg by mouth 2 (two) times daily.  . diclofenac sodium (VOLTAREN) 1 % GEL Apply topically as directed.  . nortriptyline (PAMELOR) 10 MG capsule TAKE 1 CAPSULE AT NIGHT FOR ONE WEEK, THEN TAKE 2 CAPSULES AT NIGHT FOR ONE WEEK, THEN TAKE 3 CAPSULES AT NIGHT  . rizatriptan (MAXALT) 10 MG tablet Take  1 tablet by mouth at onset of migraine. May repeat in 2 hours if needed. May 2 per day.  Marland Kitchen rOPINIRole (REQUIP) 0.25 MG tablet TAKE 1 TO 2 TABLETS AT BEDTIME  . simvastatin (ZOCOR) 20 MG tablet TAKE 1 TABLET AT BEDTIME  . topiramate (TOPAMAX) 50 MG tablet Take one tablet in the AM and two tablets in the PM.  . fluticasone (FLONASE) 50 MCG/ACT nasal spray Place 1 spray into both nostrils daily.  . traMADol (ULTRAM) 50 MG tablet    No facility-administered encounter medications on file as of 03/09/2018.      Review of Systems  Review of Systems  Constitutional: Negative for fatigue and fever.  HENT: Positive for congestion (Clear nasal drainage) and postnasal drip. Negative for sinus pressure, sinus pain and sore throat.   Respiratory: Negative for cough, shortness of breath and wheezing.   Cardiovascular:  Negative for chest pain and palpitations.  Gastrointestinal: Negative for diarrhea, nausea and vomiting.  Musculoskeletal: Negative for arthralgias.  Neurological: Positive for dizziness and light-headedness (Has contacted cardiology regarding the symptoms).     Physical Exam  BP (!) 148/72 (BP Location: Left Arm, Cuff Size: Normal)   Pulse (!) 48   Ht 6\' 2"  (1.88 m)   Wt 254 lb 12.8 oz (115.6 kg)   SpO2 99%   BMI 32.71 kg/m   Wt Readings from Last 5 Encounters:  03/09/18 254 lb 12.8 oz (115.6 kg)  03/06/18 252 lb 6 oz (114.5 kg)  02/06/18 257 lb 6.4 oz (116.8 kg)  01/28/18 257 lb (116.6 kg)  01/21/18 253 lb (114.8 kg)    Physical Exam  Constitutional: He is oriented to person, place, and time and well-developed, well-nourished, and in no distress. No distress.  HENT:  Head: Normocephalic and atraumatic.  Right Ear: Hearing and external ear normal.  Left Ear: Hearing and external ear normal.  Nose: Mucosal edema and rhinorrhea present.  Mouth/Throat: Uvula is midline and oropharynx is clear and moist. No oropharyngeal exudate.  + Hearing aids bilaterally + Postnasal drip + Mallampati 4  Eyes: Pupils are equal, round, and reactive to light.  Neck: Normal range of motion. Neck supple.  Cardiovascular: Normal rate, regular rhythm and normal heart sounds.  Pulmonary/Chest: Effort normal and breath sounds normal. No accessory muscle usage. No respiratory distress. He has no decreased breath sounds. He has no wheezes. He has no rhonchi.  Musculoskeletal: Normal range of motion.  Lymphadenopathy:    He has no cervical adenopathy.  Neurological: He is alert and oriented to person, place, and time. Gait normal.  Skin: Skin is warm and dry. He is not diaphoretic. No erythema.  Psychiatric: Mood, memory, affect and judgment normal.  Nursing note and vitals reviewed.     Lab Results:  CBC    Component Value Date/Time   WBC 7.5 02/21/2016 0907   RBC 4.63 02/21/2016 0907    HGB 14.7 02/21/2016 0907   HCT 42.1 02/21/2016 0907   PLT 162.0 02/21/2016 0907   MCV 90.9 02/21/2016 0907   MCH 31.8 01/19/2014 1041   MCHC 35.0 02/21/2016 0907   RDW 13.4 02/21/2016 0907   RDW 13.3 01/19/2014 1041   LYMPHSABS 2.2 01/19/2014 1041   MONOABS 0.7 09/27/2009 1457   EOSABS 0.3 01/19/2014 1041   BASOSABS 0.0 01/19/2014 1041    BMET    Component Value Date/Time   NA 140 09/30/2017 1019   NA 140 01/19/2014 1041   K 4.9 09/30/2017 1019   CL 108 09/30/2017  1019   CO2 24 09/30/2017 1019   GLUCOSE 92 09/30/2017 1019   BUN 19 09/30/2017 1019   BUN 18 01/19/2014 1041   CREATININE 0.97 09/30/2017 1019   CALCIUM 9.6 09/30/2017 1019   GFRNONAA 103 01/19/2014 1041   GFRAA 119 01/19/2014 1041    BNP No results found for: BNP  ProBNP No results found for: PROBNP    Assessment & Plan:     OSA (obstructive sleep apnea) Assessment: Previous diagnosis of obstructive sleep apnea Previous management on CPAP Improved back pain to allow for better CPAP compliance January/2020 - HST -AHI 11.6, SPO2 79% Mallampati 4 BMI 32  Plan: New CPAP start Mask of choice DME [facility] Patient to follow-up in 2 months to show 30-day compliance Patient to work on healthy weight  Allergic rhinitis Assessment: Patient reporting nasal congestion with clear drainage Rhinorrhea and nasal mucosal edema on exam Patient reports he has seasonal nasal congestion typically worse during winter months  Plan: Flonase 1 spray each nostril as needed for nasal congestion and allergy symptoms Patient can start daily antihistamine to help manage symptoms   Bradycardia Plan: Contact cardiology today to follow-up with them as they tried to contact you on 03/06/2018      Lauraine Rinne, NP 03/09/2018   This appointment was 26 min long with over 50% of the time in direct face-to-face patient care, assessment, plan of care, and follow-up.

## 2018-03-09 ENCOUNTER — Ambulatory Visit (INDEPENDENT_AMBULATORY_CARE_PROVIDER_SITE_OTHER): Payer: Medicare HMO | Admitting: Pulmonary Disease

## 2018-03-09 ENCOUNTER — Encounter: Payer: Self-pay | Admitting: Pulmonary Disease

## 2018-03-09 VITALS — BP 148/72 | HR 48 | Ht 74.0 in | Wt 254.8 lb

## 2018-03-09 DIAGNOSIS — R001 Bradycardia, unspecified: Secondary | ICD-10-CM

## 2018-03-09 DIAGNOSIS — M549 Dorsalgia, unspecified: Secondary | ICD-10-CM | POA: Diagnosis not present

## 2018-03-09 DIAGNOSIS — G4733 Obstructive sleep apnea (adult) (pediatric): Secondary | ICD-10-CM

## 2018-03-09 DIAGNOSIS — M62551 Muscle wasting and atrophy, not elsewhere classified, right thigh: Secondary | ICD-10-CM | POA: Diagnosis not present

## 2018-03-09 DIAGNOSIS — J309 Allergic rhinitis, unspecified: Secondary | ICD-10-CM | POA: Diagnosis not present

## 2018-03-09 DIAGNOSIS — M2560 Stiffness of unspecified joint, not elsewhere classified: Secondary | ICD-10-CM | POA: Diagnosis not present

## 2018-03-09 DIAGNOSIS — M545 Low back pain: Secondary | ICD-10-CM | POA: Diagnosis not present

## 2018-03-09 MED ORDER — FLUTICASONE PROPIONATE 50 MCG/ACT NA SUSP: 1.0000 | Freq: Every day | NASAL | 3 refills | Status: DC

## 2018-03-09 NOTE — Telephone Encounter (Signed)
He wore a monitor last month and it looked OK.  If he is not having syncope there is no further work up but I would be happy to review this in the office if he is having any problems.

## 2018-03-09 NOTE — Telephone Encounter (Signed)
Returned pt [email or web address] 

## 2018-03-09 NOTE — Telephone Encounter (Signed)
Spoke with pt. Pt sts that he was seen by Dr.Sood's office today and it was noted that his heartrate in the office was 48bpm. Asked pt if a pulse ox was used or if it was checked manually through palpating or ausculation. Pt sts that the pulse ox was used and then it was checked manually. He was told to update Dr.Hochrein.  Pt sts that he has a hx of skipped beats and slow heartrate. He is unable to monitor his HR at home. He would like to know what is Dr.Hochrein's plan of care and what he should tell his other physicians when they voice concern about his slow heart rate. He is also requesting a prescription for a medical alert bracelet that would have his cardiac dx on it.  Adv pt that I will fwd a message to Dr.Hochrein for his recommendation and if he thinks a medical alert bracelet is indicated. Adv him that we will call back with his response. Pt sts that it is ok to leave a detailed message in his voice mail.

## 2018-03-09 NOTE — Progress Notes (Signed)
Reviewed and agree with assessment/plan.   Apryll Hinkle, MD [facility] 02/21/2016, 12:24 PM Pager:  [phone]  

## 2018-03-09 NOTE — Assessment & Plan Note (Signed)
Assessment: Patient reporting nasal congestion with clear drainage Rhinorrhea and nasal mucosal edema on exam Patient reports he has seasonal nasal congestion typically worse during winter months  Plan: Flonase 1 spray each nostril as needed for nasal congestion and allergy symptoms Patient can start daily antihistamine to help manage symptoms

## 2018-03-09 NOTE — Telephone Encounter (Signed)
New Message   Pt returning a call for the nurse. Please call

## 2018-03-09 NOTE — Patient Instructions (Signed)
New CPAP start  APAP 5-15 Mask of choice  Supplies  DME: [facility]   We recommend that you start using your CPAP daily >>>Keep up the hard work using your device >>> Goal should be wearing this for the entire night that you are sleeping, at least 4 to 6 hours  Remember:  . Do not drive or operate heavy machinery if tired or drowsy.  . Please notify the supply company and office if you are unable to use your device regularly due to missing supplies or machine being broken.  . Work on maintaining a healthy weight and following your recommended nutrition plan  . Maintain proper daily exercise and movement  . Maintaining proper use of your device can also help improve management of other chronic illnesses such as: Blood pressure, blood sugars, and weight management.   BiPAP/ CPAP Cleaning:  >>>Clean weekly, with Dawn soap, and bottle brush.  Set up to air dry.   Follow up in 2 months to show 30day CPAP compliance    Please start taking a daily antihistamine:  >>>choose one of: zyrtec, claritin, allegra, or xyzal  >>>these are over the counter medications  >>>can choose generic option  >>>take daily  >>>this medication helps with allergies, post nasal drip, and cough   Flonase nasal spray 1 spray each nostril daily for congestion and allergies symptoms   It is flu season:   >>>Remember to be washing your hands regularly, using hand sanitizer, be careful to use around herself with has contact with people who are sick will increase her chances of getting sick yourself. >>> Best ways to protect herself from the flu: Receive the yearly flu vaccine, practice good hand hygiene washing with soap and also using hand sanitizer when available, eat a nutritious meals, get adequate rest, hydrate appropriately   Please contact the office if your symptoms worsen or you have concerns that you are not improving.   Thank you for choosing [facility] for your healthcare, and for allowing  [place] to partner with you on your healthcare journey. I am thankful to be able to provide care to you today.   [name] FNP-C     CPAP and BPAP Information CPAP and BPAP are methods of helping a person breathe with the use of air pressure. CPAP stands for "continuous positive airway pressure." BPAP stands for "bi-level positive airway pressure." In both methods, air is blown through your nose or mouth and into your air passages to help you breathe well. CPAP and BPAP use different amounts of pressure to blow air. With CPAP, the amount of pressure stays the same while you breathe in and out. With BPAP, the amount of pressure is increased when you breathe in (inhale) so that you can take larger breaths. Your health care provider will recommend whether CPAP or BPAP would be more helpful for you. Why are CPAP and BPAP treatments used? CPAP or BPAP can be helpful if you have:  Sleep apnea.  Chronic obstructive pulmonary disease (COPD).  Heart failure.  Medical conditions that weaken the muscles of the chest including muscular dystrophy, or neurological diseases such as amyotrophic lateral sclerosis (ALS).  Other problems that cause breathing to be weak, abnormal, or difficult. CPAP is most commonly used for obstructive sleep apnea (OSA) to keep the airways from collapsing when the muscles relax during sleep. How is CPAP or BPAP administered? Both CPAP and BPAP are provided by a small machine with a flexible plastic tube that attaches to  a plastic mask. You wear the mask. Air is blown through the mask into your nose or mouth. The amount of pressure that is used to blow the air can be adjusted on the machine. Your health care provider will determine the pressure setting that should be used based on your individual needs. When should CPAP or BPAP be used? In most cases, the mask only needs to be worn during sleep. Generally, the mask needs to be worn throughout the night and during any daytime naps.  People with certain medical conditions may also need to wear the mask at other times when they are awake. Follow instructions from your health care provider about when to use the machine. What are some tips for using the mask?   Because the mask needs to be snug, some people feel trapped or closed-in (claustrophobic) when first using the mask. If you feel this way, you may need to get used to the mask. One way to do this is by holding the mask loosely over your nose or mouth and then gradually applying the mask more snugly. You can also gradually increase the amount of time that you use the mask.  Masks are available in various types and sizes. Some fit over your mouth and nose while others fit over just your nose. If your mask does not fit well, talk with your health care provider about getting a different one.  If you are using a mask that fits over your nose and you tend to breathe through your mouth, a chin strap may be applied to help keep your mouth closed.  The CPAP and BPAP machines have alarms that may sound if the mask comes off or develops a leak.  If you have trouble with the mask, it is very important that you talk with your health care provider about finding a way to make the mask easier to tolerate. Do not stop using the mask. Stopping the use of the mask could have a negative impact on your health. What are some tips for using the machine?  Place your CPAP or BPAP machine on a secure table or stand near an electrical outlet.  Know where the on/off switch is located on the machine.  Follow instructions from your health care provider about how to set the pressure on your machine and when you should use it.  Do not eat or drink while the CPAP or BPAP machine is on. Food or fluids could get pushed into your lungs by the pressure of the CPAP or BPAP.  Do not smoke. Tobacco smoke residue can damage the machine.  For home use, CPAP and BPAP machines can be rented or purchased  through home health care companies. Many different brands of machines are available. Renting a machine before purchasing may help you find out which particular machine works well for you.  Keep the CPAP or BPAP machine and attachments clean. Ask your health care provider for specific instructions. Get help right away if:  You have redness or open areas around your nose or mouth where the mask fits.  You have trouble using the CPAP or BPAP machine.  You cannot tolerate wearing the CPAP or BPAP mask.  You have pain, discomfort, and bloating in your abdomen. Summary  CPAP and BPAP are methods of helping a person breathe with the use of air pressure.  Both CPAP and BPAP are provided by a small machine with a flexible plastic tube that attaches to a plastic mask.  If  you have trouble with the mask, it is very important that you talk with your health care provider about finding a way to make the mask easier to tolerate. This information is not intended to replace advice given to you by your health care provider. Make sure you discuss any questions you have with your health care provider. Document Released: 11/10/2003 Document Revised: 10/14/2017 Document Reviewed: 01/01/2016 Elsevier Interactive Patient Education  2019 [employer].

## 2018-03-09 NOTE — Assessment & Plan Note (Signed)
Plan: Contact cardiology today to follow-up with them as they tried to contact you on 03/06/2018

## 2018-03-09 NOTE — Assessment & Plan Note (Signed)
Assessment: Previous diagnosis of obstructive sleep apnea Previous management on CPAP Improved back pain to allow for better CPAP compliance January/2020 - HST -AHI 11.6, SPO2 79% Mallampati 4 BMI 32  Plan: New CPAP start Mask of choice DME [facility] Patient to follow-up in 2 months to show 30-day compliance Patient to work on healthy weight

## 2018-03-10 DIAGNOSIS — G894 Chronic pain syndrome: Secondary | ICD-10-CM | POA: Diagnosis not present

## 2018-03-10 DIAGNOSIS — M1990 Unspecified osteoarthritis, unspecified site: Secondary | ICD-10-CM | POA: Diagnosis not present

## 2018-03-10 NOTE — Telephone Encounter (Signed)
Called patient, advised of note from MD.  Patient questioned the medical bracelet, advised patient if he would like one he could get them from any medical supply store, and have it say what he would like: has bradycardia. So if something were to happen they would know his heart rate is consistently lower. Patient verbalized understanding. Appreciative for the call back.

## 2018-03-11 DIAGNOSIS — M549 Dorsalgia, unspecified: Secondary | ICD-10-CM | POA: Diagnosis not present

## 2018-03-11 DIAGNOSIS — M2560 Stiffness of unspecified joint, not elsewhere classified: Secondary | ICD-10-CM | POA: Diagnosis not present

## 2018-03-11 DIAGNOSIS — M62551 Muscle wasting and atrophy, not elsewhere classified, right thigh: Secondary | ICD-10-CM | POA: Diagnosis not present

## 2018-03-11 DIAGNOSIS — G4733 Obstructive sleep apnea (adult) (pediatric): Secondary | ICD-10-CM | POA: Diagnosis not present

## 2018-03-11 DIAGNOSIS — M545 Low back pain: Secondary | ICD-10-CM | POA: Diagnosis not present

## 2018-03-13 ENCOUNTER — Telehealth: Payer: Self-pay | Admitting: Pulmonary Disease

## 2018-03-13 NOTE — Progress Notes (Deleted)
Subjective:   Steven Vincent is a 53 y.o. male who presents for Medicare Annual/Subsequent preventive examination.  Review of Systems:  No ROS.  Medicare Wellness Visit. Additional risk factors are reflected in the social history.    Sleep patterns: {SX; SLEEP PATTERNS:18802::"feels rested on waking","does not get up to void","gets up *** times nightly to void","sleeps *** hours nightly"}.    Home Safety/Smoke Alarms: Feels safe in home. Smoke alarms in place.  Living environment; residence and Firearm Safety: {Rehab home environment / accessibility:30080::"no firearms","firearms stored safely"}. Seat Belt Safety/Bike Helmet: Wears seat belt.  Male:   CCS-     PSA- No results found for: PSA      Objective:    Vitals: There were no vitals taken for this visit.  There is no height or weight on file to calculate BMI.  Advanced Directives 05/05/2017 03/04/2017 03/04/2017 02/22/2017 02/15/2017 02/14/2017 01/16/2015  Does Patient Have a Medical Advance Directive? No No Yes No No No No  Would patient like information on creating a medical advance directive? No - Patient declined - - No - Patient declined No - Patient declined No - Patient declined -    Tobacco Social History   Tobacco Use  Smoking Status Former Smoker  Smokeless Tobacco Never Used  Tobacco Comment   states he only tried as a teenager     Counseling given: Not Answered Comment: states he only tried as a teenager   Past Medical History:  Diagnosis Date  . Arthritis   . Baker's cyst   . Headache(784.0)   . Knee pain   . Low back pain   . Migraines   . OSA (obstructive sleep apnea) 04/10/2017  . Personality disorder ([place])   . Seizures ([place])    Past Surgical History:  Procedure Laterality Date  . TOOTH EXTRACTION  12/2014   lower left   Family History  Problem Relation Age of Onset  . Heart disease Father        defibrilator  . Hyperlipidemia Father   . Hypertension Father   . Alzheimer's  disease Father   . Other Father        stents in legs  . Aneurysm Mother        brain  . Hypertension Brother   . Stroke Brother        X2   Social History   Socioeconomic History  . Marital status: Divorced    Spouse name: Not on file  . Number of children: Not on file  . Years of education: Not on file  . Highest education level: Not on file  Occupational History  . Occupation: disabled  Social Needs  . Financial resource strain: Not on file  . Food insecurity:    Worry: Not on file    Inability: Not on file  . Transportation needs:    Medical: Not on file    Non-medical: Not on file  Tobacco Use  . Smoking status: Former Research scientist (life sciences)  . Smokeless tobacco: Never Used  . Tobacco comment: states he only tried as a teenager  Substance and Sexual Activity  . Alcohol use: No  . Drug use: No  . Sexual activity: Yes  Lifestyle  . Physical activity:    Days per week: Not on file    Minutes per session: Not on file  . Stress: Not on file  Relationships  . Social connections:    Talks on phone: Not on file    Gets together: Not  on file    Attends religious service: Not on file    Active member of club or organization: Not on file    Attends meetings of clubs or organizations: Not on file    Relationship status: Not on file  Other Topics Concern  . Not on file  Social History Narrative   Lives   Caffeine use:    Outpatient Encounter Medications as of 03/16/2018  Medication Sig  . baclofen (LIORESAL) 10 MG tablet Take 10 mg by mouth 2 (two) times daily.  . diclofenac sodium (VOLTAREN) 1 % GEL Apply topically as directed.  . fluticasone (FLONASE) 50 MCG/ACT nasal spray Place 1 spray into both nostrils daily.  . nortriptyline (PAMELOR) 10 MG capsule TAKE 1 CAPSULE AT NIGHT FOR ONE WEEK, THEN TAKE 2 CAPSULES AT NIGHT FOR ONE WEEK, THEN TAKE 3 CAPSULES AT NIGHT  . rizatriptan (MAXALT) 10 MG tablet Take 1 tablet by mouth at onset of migraine. May repeat in 2 hours if needed.  May 2 per day.  Marland Kitchen rOPINIRole (REQUIP) 0.25 MG tablet TAKE 1 TO 2 TABLETS AT BEDTIME  . simvastatin (ZOCOR) 20 MG tablet TAKE 1 TABLET AT BEDTIME  . topiramate (TOPAMAX) 50 MG tablet Take one tablet in the AM and two tablets in the PM.  . traMADol (ULTRAM) 50 MG tablet    No facility-administered encounter medications on file as of 03/16/2018.     Activities of Daily Living No flowsheet data found.  Patient Care Team: [place], Betty G, MD as PCP - General (Family Medicine) Norma Fredrickson, MD as Consulting Physician (Psychiatry) Minus Breeding, MD as Consulting Physician (Cardiology) Phylliss Bob, MD as Consulting Physician (Orthopedic Surgery)   Assessment:   This is a routine wellness examination for Steven Vincent. Physical assessment deferred to PCP.   Exercise Activities and Dietary recommendations   Diet (meal preparation, eat out, water intake, caffeinated beverages, dairy products, fruits and vegetables): {Desc; diets:16563}     Goals    . Exercise 150 min/wk Moderate Activity     When you back is resolved, please walk at least 30 minutes x 2 per day        Fall Risk Fall Risk  03/04/2017  Falls in the past year? No    Depression Screen PHQ 2/9 Scores 03/04/2017  PHQ - 2 Score 0    Cognitive Function        Immunization History  Administered Date(s) Administered  . Influenza Split 12/03/2010  . Influenza Whole 12/04/2006, 11/17/2007, 01/11/2009  . Td 12/04/2006    Qualifies for Shingles Vaccine?   Screening Tests Health Maintenance  Topic Date Due  . HIV Screening  08/06/1980  . INFLUENZA VACCINE  11/11/2018 (Originally 09/25/2017)  . TETANUS/TDAP  11/16/2018 (Originally 12/03/2016)  . COLONOSCOPY  09/20/2025      Plan:     I have personally reviewed and noted the following in the patient's chart:   . Medical and social history . Use of alcohol, tobacco or illicit drugs  . Current medications and supplements . Functional ability and  status . Nutritional status . Physical activity . Advanced directives . List of other physicians . Vitals . Screenings to include cognitive, depression, and falls . Referrals and appointments  In addition, I have reviewed and discussed with patient certain preventive protocols, quality metrics, and best practice recommendations. A written personalized care plan for preventive services as well as general preventive health recommendations were provided to patient.     Alphia Moh, RN  03/13/2018   

## 2018-03-13 NOTE — Telephone Encounter (Signed)
Called and spoke with Patient.  He stated that he wanted Dr. [name] to know he started his CPAP two nights ago.  He is complaining with dry mouth, when he wakes in the morning. Patient's OV is 05/04/18, with VS.  Routed to VS as FYI

## 2018-03-13 NOTE — Telephone Encounter (Signed)
Please let him know he can try increasing the temperature setting on his CPAP humidifier.  He should also make sure he is not having any mask leak.  If he feels that his mask is leaking, then he should contact his DME about getting his mask refitted.

## 2018-03-16 ENCOUNTER — Ambulatory Visit: Payer: Medicare HMO

## 2018-03-16 ENCOUNTER — Telehealth: Payer: Self-pay | Admitting: Pulmonary Disease

## 2018-03-16 ENCOUNTER — Telehealth: Payer: Self-pay

## 2018-03-16 ENCOUNTER — Ambulatory Visit (INDEPENDENT_AMBULATORY_CARE_PROVIDER_SITE_OTHER): Payer: Medicare HMO

## 2018-03-16 VITALS — BP 102/74 | HR 60 | Resp 20 | Ht 74.0 in | Wt 254.0 lb

## 2018-03-16 DIAGNOSIS — Z0000 Encounter for general adult medical examination without abnormal findings: Secondary | ICD-10-CM | POA: Diagnosis not present

## 2018-03-16 DIAGNOSIS — G4733 Obstructive sleep apnea (adult) (pediatric): Secondary | ICD-10-CM

## 2018-03-16 NOTE — Telephone Encounter (Signed)
Is it the starting pressure is too low? If so you can change APAP settings to 10-15 and see how he does.   Please place the order.   [name] FNP

## 2018-03-16 NOTE — Progress Notes (Signed)
Subjective:   Steven Vincent is a 53 y.o. male who presents for Medicare Annual/Subsequent preventive examination.  Review of Systems:  No ROS.  Medicare Wellness Visit. Additional risk factors are reflected in the social history.  Cardiac Risk Factors include: dyslipidemia;male gender;sedentary lifestyle;obesity (BMI >30kg/m2) Sleep patterns: has interrupted sleep, is not rested upon awakening and has daytime sleepiness. Sleep hygiene tips reviewed. Pt. Stated his settings on CPAP machine at home were recently adjusted, so he is hoping that will help him.      Home Safety/Smoke Alarms: Feels safe in home. Smoke alarms in place.  Living environment; residence and Firearm Safety: 1-story house/ trailer, apartment. No DME used or needed at this time. Seat Belt Safety/Bike Helmet: Wears seat belt.  Male:   CCS- 2017; due 2027   PSA- No results found for: PSA      Objective:    Vitals: BP 102/74 (BP Location: Left Arm, Patient Position: Sitting, Cuff Size: Large)   Pulse 60   Resp 20   Ht '6\' 2"'  (1.88 m)   Wt 254 lb (115.2 kg)   SpO2 98%   BMI 32.61 kg/m   Body mass index is 32.61 kg/m.  Advanced Directives 03/16/2018 05/05/2017 03/04/2017 03/04/2017 02/22/2017 02/15/2017 02/14/2017  Does Patient Have a Medical Advance Directive? Yes No No Yes No No No  Type of Advance Directive [place]  Does patient want to make changes to medical advance directive? No - Patient declined - - - - - -  Copy of [place] in Chart? Yes - validated most recent copy scanned in chart (See row information) - - - - - -  Would patient like information on creating a medical advance directive? - No - Patient declined - - No - Patient declined No - Patient declined No - Patient declined    Tobacco Social History   Tobacco Use  Smoking Status Former Smoker  Smokeless Tobacco Never Used  Tobacco Comment   states he only tried as a teenager       Counseling given: Not Answered Comment: states he only tried as a teenager  Past Medical History:  Diagnosis Date  . Arthritis   . Baker's cyst   . Headache(784.0)   . Knee pain   . Low back pain   . Migraines   . OSA (obstructive sleep apnea) 04/10/2017  . Personality disorder ([place])   . Seizures ([place])    Past Surgical History:  Procedure Laterality Date  . TOOTH EXTRACTION  12/2014   lower left   Family History  Problem Relation Age of Onset  . Heart disease Father        defibrilator  . Hyperlipidemia Father   . Hypertension Father   . Alzheimer's disease Father   . Other Father        stents in legs  . Aneurysm Mother        brain  . Hypertension Brother   . Stroke Brother        X2   Social History   Socioeconomic History  . Marital status: Divorced    Spouse name: Not on file  . Number of children: 0  . Years of education: Not on file  . Highest education level: Not on file  Occupational History  . Occupation: disabled  Social Needs  . Financial resource strain: Hard  . Food insecurity:    Worry: Never true  Inability: Never true  . Transportation needs:    Medical: No    Non-medical: No  Tobacco Use  . Smoking status: Former Research scientist (life sciences)  . Smokeless tobacco: Never Used  . Tobacco comment: states he only tried as a teenager  Substance and Sexual Activity  . Alcohol use: No  . Drug use: No  . Sexual activity: Not Currently  Lifestyle  . Physical activity:    Days per week: 0 days    Minutes per session: 0 min  . Stress: Very much  Relationships  . Social connections:    Talks on phone: Not on file    Gets together: Not on file    Attends religious service: Not on file    Active member of club or organization: Not on file    Attends meetings of clubs or organizations: Not on file    Relationship status: Not on file  Other Topics Concern  . Not on file  Social History Narrative   Lives   Caffeine use:    Outpatient Encounter Medications  as of 03/16/2018  Medication Sig  . baclofen (LIORESAL) 10 MG tablet Take 10 mg by mouth 2 (two) times daily.  . nortriptyline (PAMELOR) 10 MG capsule TAKE 1 CAPSULE AT NIGHT FOR ONE WEEK, THEN TAKE 2 CAPSULES AT NIGHT FOR ONE WEEK, THEN TAKE 3 CAPSULES AT NIGHT  . rizatriptan (MAXALT) 10 MG tablet Take 1 tablet by mouth at onset of migraine. May repeat in 2 hours if needed. May 2 per day.  Marland Kitchen rOPINIRole (REQUIP) 0.25 MG tablet TAKE 1 TO 2 TABLETS AT BEDTIME  . simvastatin (ZOCOR) 20 MG tablet TAKE 1 TABLET AT BEDTIME  . topiramate (TOPAMAX) 50 MG tablet Take one tablet in the AM and two tablets in the PM.  . traMADol (ULTRAM) 50 MG tablet daily as needed.   . diclofenac sodium (VOLTAREN) 1 % GEL Apply topically as directed. (Patient not taking: Reported on 03/16/2018)  . fluticasone (FLONASE) 50 MCG/ACT nasal spray Place 1 spray into both nostrils daily. (Patient not taking: Reported on 03/16/2018)   No facility-administered encounter medications on file as of 03/16/2018.     Activities of Daily Living In your present state of health, do you have any difficulty performing the following activities: 03/16/2018  Hearing? N  Vision? N  Difficulty concentrating or making decisions? N  Walking or climbing stairs? N  Dressing or bathing? N  Doing errands, shopping? N  Preparing Food and eating ? N  Using the Toilet? N  In the past six months, have you accidently leaked urine? N  Do you have problems with loss of bowel control? N  Managing your Medications? N  Managing your Finances? Y  Housekeeping or managing your Housekeeping? N  Some recent data might be hidden    Patient Care Team: Martinique, Betty G, MD as PCP - General (Family Medicine) Norma Fredrickson, MD as Consulting Physician (Psychiatry) Minus Breeding, MD as Consulting Physician (Cardiology) Phylliss Bob, MD as Consulting Physician (Orthopedic Surgery) Kathrynn Ducking, MD as Consulting Physician (Neurology)   Assessment:    This is a routine wellness examination for Steven Vincent. Physical assessment deferred to PCP.   Exercise Activities and Dietary recommendations Current Exercise Habits: The patient does not participate in regular exercise at present, Exercise limited by: psychological condition(s);cardiac condition(s);neurologic condition(s) Diet (meal preparation, eat out, water intake, caffeinated beverages, dairy products, fruits and vegetables): in general, an "unhealthy" diet. "I eat whatever". Good hydration and low cholesterol diet reviewed  with patient. Ways to lower triglycerides specifically reviewed.        Goals    . Exercise 150 min/wk Moderate Activity     When you back is resolved, please walk at least 30 minutes x 2 per day     . Patient Stated     Improve sleep with CPAP, secure affordable housing       Fall Risk Fall Risk  03/16/2018 03/04/2017  Falls in the past year? 0 No    Depression Screen PHQ 2/9 Scores 03/16/2018 03/04/2017  PHQ - 2 Score 0 0  PHQ- 9 Score 7 -    Cognitive Function       Ad8 score reviewed for issues:  Issues making decisions: no  Less interest in hobbies / activities: no  Repeats questions, stories (family complaining): no  Trouble using ordinary gadgets (microwave, computer, phone):no  Forgets the month or year: no  Mismanaging finances: no  Remembering appts: no  Daily problems with thinking and/or memory: no Ad8 score is= 0    Immunization History  Administered Date(s) Administered  . Influenza Split 12/03/2010  . Influenza Whole 12/04/2006, 11/17/2007, 01/11/2009  . Td 12/04/2006    Screening Tests Health Maintenance  Topic Date Due  . HIV Screening  08/06/1980  . INFLUENZA VACCINE  11/11/2018 (Originally 09/25/2017)  . TETANUS/TDAP  11/16/2018 (Originally 12/03/2016)  . COLONOSCOPY  09/20/2025        Plan:    Will follow-up with Dr. Martinique about getting blood drawn for MMR titer, and any other screenings she recommends.  Diet  education on lowering triglycerides provided.   Adequate sleep will help you lose weight and cope with added stressors in your life, so staying focused on adequate sleep may be the best short-term health goal. Sleep hygiene education from [facility] provided.   Good luck with your apartment and job hunt, and let us know if we can further assist you with financial resource information (provided).  Take care of yourself and Glenard Haring and I hope to see you next year!  I have personally reviewed and noted the following in the patient's chart:   . Medical and social history . Use of alcohol, tobacco or illicit drugs  . Current medications and supplements . Functional ability and status . Nutritional status . Physical activity . Advanced directives . List of other physicians . Vitals . Screenings to include cognitive, depression, and falls . Referrals and appointments  In addition, I have reviewed and discussed with patient certain preventive protocols, quality metrics, and best practice recommendations. A written personalized care plan for preventive services as well as general preventive health recommendations were provided to patient.     Alphia Moh, RN  03/16/2018

## 2018-03-16 NOTE — Telephone Encounter (Signed)
LMTCB x1 for pt. ------------------------------------------ Steven Sin, LPN    Order placed to change CPAP settings 10-15 per Steven Quaker, NP recommendations. DME [facility].  Left detailed message that order has been placed. Nothing further at this time.    ------ Steven Rinne, NP    Is it the starting pressure is too low? If so you can change APAP settings to 10-15 and see how he does.   Please place the order.   Steven Quaker FNP     ----- [name], CMA   Primary Pulmonologist: Dr. [name] Last office visit and with whom: 03/09/2018 with Steven Vincent What do we see them for (pulmonary problems): OSA Last OV assessment/plan: New CPAP start, return in 2 months  Was appointment offered to patient (explain)?  No  Reason for call:  Spoke with pt. He states that he feels his CPAP pressure is to low. Denies any issues with mask or machine. When asking him why he feels his pressure is to low he could not elaborate. Per our last order, it was set at auto 5-15. Pt is not in [place] to retrieve a download.  Steven Vincent - please advise. Thanks.

## 2018-03-16 NOTE — Telephone Encounter (Signed)
Order placed to change CPAP settings 10-15 per [name], NP recommendations. DME [facility].  Left detailed message that order has been placed. Nothing further at this time.

## 2018-03-16 NOTE — Telephone Encounter (Signed)
Patient is returning phone call.  Phone number is [phone].

## 2018-03-16 NOTE — Patient Instructions (Addendum)
Will follow-up with Dr. Martinique about getting blood drawn for MMR titer, and any other screenings she recommends.  Diet education on lowering triglycerides provided.   Adequate sleep will help you lose weight and cope with added stressors in your life, so staying focused on adequate sleep may be the best short-term health goal. Sleep hygiene education from [facility] provided.   Good luck with your apartment and job hunt, and let us know if we can further assist you with financial resource information (provided).  Take care of yourself and Glenard Haring and I hope to see you next year!  Mr. Lehenbauer , Thank you for taking time to come for your Medicare Wellness Visit. I appreciate your ongoing commitment to your health goals. Please review the following plan we discussed and let me know if I can assist you in the future.   These are the goals we discussed: Goals    . Exercise 150 min/wk Moderate Activity     When you back is resolved, please walk at least 30 minutes x 2 per day     . Patient Stated     Improve sleep with CPAP, secure affordable housing       This is a list of the screening recommended for you and due dates:  Health Maintenance  Topic Date Due  . HIV Screening  08/06/1980  . Flu Shot  11/11/2018*  . Tetanus Vaccine  11/16/2018*  . Colon Cancer Screening  09/20/2025  *Topic was postponed. The date shown is not the original due date.     Health Maintenance, Male A healthy lifestyle and preventive care is important for your health and wellness. Ask your health care provider about what schedule of regular examinations is right for you. What should I know about weight and diet? Eat a Healthy Diet  Eat plenty of vegetables, fruits, whole grains, low-fat dairy products, and lean protein.  Do not eat a lot of foods high in solid fats, added sugars, or salt.  Maintain a Healthy Weight Regular exercise can help you achieve or maintain a healthy weight. You should:  Do at least 150  minutes of exercise each week. The exercise should increase your heart rate and make you sweat (moderate-intensity exercise).  Do strength-training exercises at least twice a week. Watch Your Levels of Cholesterol and Blood Lipids  Have your blood tested for lipids and cholesterol every 5 years starting at 53 years of age. If you are at high risk for heart disease, you should start having your blood tested when you are 52 years old. You may need to have your cholesterol levels checked more often if: ? Your lipid or cholesterol levels are high. ? You are older than 53 years of age. ? You are at high risk for heart disease. What should I know about cancer screening? Many types of cancers can be detected early and may often be prevented. Lung Cancer  You should be screened every year for lung cancer if: ? You are a current smoker who has smoked for at least 30 years. ? You are a former smoker who has quit within the past 15 years.  Talk to your health care provider about your screening options, when you should start screening, and how often you should be screened. Colorectal Cancer  Routine colorectal cancer screening usually begins at 53 years of age and should be repeated every 5-10 years until you are 52 years old. You may need to be screened more often if early forms  of precancerous polyps or small growths are found. Your health care provider may recommend screening at an earlier age if you have risk factors for colon cancer.  Your health care provider may recommend using home test kits to check for hidden blood in the stool.  A small camera at the end of a tube can be used to examine your colon (sigmoidoscopy or colonoscopy). This checks for the earliest forms of colorectal cancer. Prostate and Testicular Cancer  Depending on your age and overall health, your health care provider may do certain tests to screen for prostate and testicular cancer.  Talk to your health care provider about  any symptoms or concerns you have about testicular or prostate cancer. Skin Cancer  Check your skin from head to toe regularly.  Tell your health care provider about any new moles or changes in moles, especially if: ? There is a change in a mole's size, shape, or color. ? You have a mole that is larger than a pencil eraser.  Always use sunscreen. Apply sunscreen liberally and repeat throughout the day.  Protect yourself by wearing long sleeves, pants, a wide-brimmed hat, and sunglasses when outside. What should I know about heart disease, diabetes, and high blood pressure?  If you are 56-56 years of age, have your blood pressure checked every 3-5 years. If you are 25 years of age or older, have your blood pressure checked every year. You should have your blood pressure measured twice-once when you are at a hospital or clinic, and once when you are not at a hospital or clinic. Record the average of the two measurements. To check your blood pressure when you are not at a hospital or clinic, you can use: ? An automated blood pressure machine at a pharmacy. ? A home blood pressure monitor.  Talk to your health care provider about your target blood pressure.  If you are between 72-23 years old, ask your health care provider if you should take aspirin to prevent heart disease.  Have regular diabetes screenings by checking your fasting blood sugar level. ? If you are at a normal weight and have a low risk for diabetes, have this test once every three years after the age of 4. ? If you are overweight and have a high risk for diabetes, consider being tested at a younger age or more often.  A one-time screening for abdominal aortic aneurysm (AAA) by ultrasound is recommended for men aged 64-75 years who are current or former smokers. What should I know about preventing infection? Hepatitis B If you have a higher risk for hepatitis B, you should be screened for this virus. Talk with your health  care provider to find out if you are at risk for hepatitis B infection. Hepatitis C Blood testing is recommended for:  Everyone born from [birth year] through 1965.  Anyone with known risk factors for hepatitis C. Sexually Transmitted Diseases (STDs)  You should be screened each year for STDs including gonorrhea and chlamydia if: ? You are sexually active and are younger than 53 years of age. ? You are older than 53 years of age and your health care provider tells you that you are at risk for this type of infection. ? Your sexual activity has changed since you were last screened and you are at an increased risk for chlamydia or gonorrhea. Ask your health care provider if you are at risk.  Talk with your health care provider about whether you are at high risk of  being infected with HIV. Your health care provider may recommend a prescription medicine to help prevent HIV infection. What else can I do?  Schedule regular health, dental, and eye exams.  Stay current with your vaccines (immunizations).  Do not use any tobacco products, such as cigarettes, chewing tobacco, and e-cigarettes. If you need help quitting, ask your health care provider.  Limit alcohol intake to no more than 2 drinks per day. One drink equals 12 ounces of beer, 5 ounces of wine, or 1 ounces of hard liquor.  Do not use street drugs.  Do not share needles.  Ask your health care provider for help if you need support or information about quitting drugs.  Tell your health care provider if you often feel depressed.  Tell your health care provider if you have ever been abused or do not feel safe at home. This information is not intended to replace advice given to you by your health care provider. Make sure you discuss any questions you have with your health care provider. Document Released: 08/10/2007 Document Revised: 10/11/2015 Document Reviewed: 11/15/2014 Elsevier Interactive Patient Education  2019 [employer].

## 2018-03-16 NOTE — Telephone Encounter (Signed)
ATC pt, no answer. Will try back.

## 2018-03-16 NOTE — Progress Notes (Signed)
I have reviewed available documentation from this visit and I agree with recommendations given.  Betty G. Jordan, MD  [facility]. [place] office.  

## 2018-03-16 NOTE — Telephone Encounter (Signed)
Called and spoke with patient, he is aware of response and verbalized understanding. Nothing further needed.

## 2018-03-16 NOTE — Telephone Encounter (Signed)
During AWV, pt. Stated his [employer] reached out to him regarding getting screened for MMR titers. 'I don't know if I had the shots or not". Pt. apparently also due for HIV screening per HIM. Last lipid profile 09/2017, next OV with PCP 10/2018. Routed to Dr. Martinique to advise on recommended labwork.

## 2018-03-16 NOTE — Telephone Encounter (Signed)
Primary Pulmonologist: Dr. [name] Last office visit and with whom: 03/09/2018 with Steven Vincent What do we see them for (pulmonary problems): OSA Last OV assessment/plan: New CPAP start, return in 2 months  Was appointment offered to patient (explain)?  No  Reason for call:  Spoke with pt. He states that he feels his CPAP pressure is to low. Denies any issues with mask or machine. When asking him why he feels his pressure is to low he could not elaborate. Per our last order, it was set at auto 5-15. Pt is not in [place] to retrieve a download.  Steven Vincent. Thanks.

## 2018-03-17 DIAGNOSIS — M545 Low back pain: Secondary | ICD-10-CM | POA: Diagnosis not present

## 2018-03-17 DIAGNOSIS — M62551 Muscle wasting and atrophy, not elsewhere classified, right thigh: Secondary | ICD-10-CM | POA: Diagnosis not present

## 2018-03-17 DIAGNOSIS — M549 Dorsalgia, unspecified: Secondary | ICD-10-CM | POA: Diagnosis not present

## 2018-03-17 DIAGNOSIS — M2560 Stiffness of unspecified joint, not elsewhere classified: Secondary | ICD-10-CM | POA: Diagnosis not present

## 2018-03-17 NOTE — Telephone Encounter (Signed)
Called and left message for Patient to call back about CPAP setting being changed.

## 2018-03-17 NOTE — Telephone Encounter (Signed)
It is okay to order HIV if patient is agreeable with screening, if patient refuses you can just document as "decline." In regard to MMR vaccine and/or titers, these might not be covered by Medicare. You can make recommendations to patient and suggest calling to health insurance or to get vaccine at the pharmacy. Most vaccines in Medicare patients are better covered at their pharmacies.  Thanks, BJ

## 2018-03-18 ENCOUNTER — Other Ambulatory Visit: Payer: Self-pay | Admitting: Neurology

## 2018-03-18 ENCOUNTER — Telehealth: Payer: Self-pay | Admitting: Cardiology

## 2018-03-18 NOTE — Telephone Encounter (Signed)
Returned call to patient he stated he is going to order a medical bracelet he wanted to know medical term for slow heart beat.Stated he will have bradycardia and seizure disorder put on his medical bracelet.

## 2018-03-18 NOTE — Telephone Encounter (Signed)
Spoke with pt, advised him that an order was sent to change his CPAP settings. Pt stated they already changed them and nothing further is needed.

## 2018-03-18 NOTE — Telephone Encounter (Signed)
New message:    Patient would like for some one to call him back concerning him getting a B[place]. Please call patient back.

## 2018-03-18 NOTE — Telephone Encounter (Signed)
Author phoned pt. To relay need to check with insurance about coverage of titers or MMR vaccine itself, and stated that it was OK to get booster MMR if pt. Has no record of receiving injections due to foster home past. Chief Strategy Officer offered HIV screening, advising also to check with insurance about coverage, but pt. Was unable to give yes/no regarding desire for testing, as pt. Stated he had to go but would call back. Awaiting return call.

## 2018-03-18 NOTE — Telephone Encounter (Signed)
Per message from answering service, patient returned call 01/21 at 5:10 pm, CB is [phone].

## 2018-03-19 DIAGNOSIS — M2560 Stiffness of unspecified joint, not elsewhere classified: Secondary | ICD-10-CM | POA: Diagnosis not present

## 2018-03-19 DIAGNOSIS — M545 Low back pain: Secondary | ICD-10-CM | POA: Diagnosis not present

## 2018-03-19 DIAGNOSIS — M549 Dorsalgia, unspecified: Secondary | ICD-10-CM | POA: Diagnosis not present

## 2018-03-19 DIAGNOSIS — M62551 Muscle wasting and atrophy, not elsewhere classified, right thigh: Secondary | ICD-10-CM | POA: Diagnosis not present

## 2018-03-24 ENCOUNTER — Telehealth: Payer: Self-pay | Admitting: Adult Health

## 2018-03-24 ENCOUNTER — Telehealth: Payer: Self-pay | Admitting: Pulmonary Disease

## 2018-03-24 MED ORDER — NORTRIPTYLINE HCL 25 MG PO CAPS: 50.0000 mg | ORAL_CAPSULE | Freq: Every day | ORAL | 3 refills | Status: DC

## 2018-03-24 NOTE — Telephone Encounter (Signed)
Called and left detailed message for Patient, with call back number.  [name], NP recommendations left on VM (per DPR).

## 2018-03-24 NOTE — Telephone Encounter (Signed)
Called and spoke with Patient.  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  He stated that [facility] called and told him that he is using his CPAP right, but he is not sleeping at night.  He stated that Dr. Jannifer Franklin (neuro) placed him on nortriptyline, and he is taking 3 at bedtime.  He was taking Melatonin, but was told to stop, with nortriptyline.  He is asking for any recommendations with his CPAP, to help him sleep at night, so the report is compliant.    Will route to [name], NP

## 2018-03-24 NOTE — Telephone Encounter (Signed)
Lauraine Rinne, NP    I am sorry to hear the patient has not sleeping well.  I would recommend that he follows up with his neurology team who is managing his insomnia.  CPAP is a constant pressure to help manage his obstructive sleep apnea.  He will not force him to go to sleep.  That is an insomnia issue.   I would recommend the patient follows up with neurology.    ---------------------------------------------------- Please telephone encounter from earlier today.  LMTCB x1 for pt.

## 2018-03-24 NOTE — Telephone Encounter (Signed)
I am sorry to hear the patient has not sleeping well.  I would recommend that he follows up with his neurology team who is managing his insomnia.  CPAP is a constant pressure to help manage his obstructive sleep apnea.  He will not force him to go to sleep.  That is an insomnia issue.   I would recommend the patient follows up with neurology.

## 2018-03-24 NOTE — Telephone Encounter (Signed)
I called the patient.  The patient is on 30 mg of nortriptyline at night, still not sleeping throughout the night, okay to increase to 40 mg at night for a week and then I will send in a prescription for the 25 mg capsules, he will take 2 at night.

## 2018-03-24 NOTE — Addendum Note (Signed)
Addended by: Kathrynn Ducking on: 03/24/2018 03:00 PM   Modules accepted: Orders

## 2018-03-24 NOTE — Telephone Encounter (Signed)
Pt is asking if Dr Jannifer Franklin will increase his intake of nortriptyline (PAMELOR) 10 MG capsule by 1 pill because he is still not getting the 4 hours of sleep.  Please call

## 2018-03-25 MED ORDER — NORTRIPTYLINE HCL 25 MG PO CAPS: 50.0000 mg | ORAL_CAPSULE | Freq: Every day | ORAL | 1 refills | Status: DC

## 2018-03-25 NOTE — Telephone Encounter (Signed)
LMTCB x2 for pt 

## 2018-03-25 NOTE — Telephone Encounter (Signed)
Pt is calling back [phone]

## 2018-03-25 NOTE — Telephone Encounter (Signed)
I contacted the pt and reviewed Dr. Jannifer Franklin is recommending he take 40 mg ( four 10 mg tablets) of nortriptyline for 7 days and then increase to the 25 mg bid tablets submitted on 03/24/18 to [facility]. Pt was agreeable to med instructions but requested medication be sent to [facility] mail service for a 90 day supply do to price. Rx at [facility] canceled via fax ( F # [phone]) confirmation received and submitted to [facility].

## 2018-03-25 NOTE — Telephone Encounter (Signed)
Pt has returned the call to [facility], he is asking for a call back

## 2018-03-25 NOTE — Telephone Encounter (Signed)
Call made to patient, patient states he spoke with his neurologist yesterday and they adjusted his medication. Nothing further is needed at this time.

## 2018-03-25 NOTE — Addendum Note (Signed)
Addended by: Verlin Grills T on: 03/25/2018 04:09 PM   Modules accepted: Orders

## 2018-03-25 NOTE — Telephone Encounter (Signed)
Received a hand written note from phone room staff advising the pt had questions regarding his nortriptyline dosage. I contacted the pt and lvm to further discuss questions.

## 2018-03-26 DIAGNOSIS — M545 Low back pain: Secondary | ICD-10-CM | POA: Diagnosis not present

## 2018-03-26 DIAGNOSIS — M2560 Stiffness of unspecified joint, not elsewhere classified: Secondary | ICD-10-CM | POA: Diagnosis not present

## 2018-03-26 DIAGNOSIS — M62551 Muscle wasting and atrophy, not elsewhere classified, right thigh: Secondary | ICD-10-CM | POA: Diagnosis not present

## 2018-03-26 DIAGNOSIS — M549 Dorsalgia, unspecified: Secondary | ICD-10-CM | POA: Diagnosis not present

## 2018-03-27 ENCOUNTER — Telehealth: Payer: Self-pay | Admitting: Cardiology

## 2018-03-27 NOTE — Telephone Encounter (Signed)
New Message        Patient is trying to  get his medical alert bracelet together. He states there is a word he was told to put on it and he can not remember what is was, he states it starts with a 'B". Pls call and advise.

## 2018-03-27 NOTE — Telephone Encounter (Signed)
Pt has bradycardia listed on problem list  And pt confirmed that this was the word /cy

## 2018-04-06 DIAGNOSIS — M549 Dorsalgia, unspecified: Secondary | ICD-10-CM | POA: Diagnosis not present

## 2018-04-06 DIAGNOSIS — M545 Low back pain: Secondary | ICD-10-CM | POA: Diagnosis not present

## 2018-04-06 DIAGNOSIS — M62551 Muscle wasting and atrophy, not elsewhere classified, right thigh: Secondary | ICD-10-CM | POA: Diagnosis not present

## 2018-04-06 DIAGNOSIS — M2560 Stiffness of unspecified joint, not elsewhere classified: Secondary | ICD-10-CM | POA: Diagnosis not present

## 2018-04-10 DIAGNOSIS — F2089 Other schizophrenia: Secondary | ICD-10-CM | POA: Diagnosis not present

## 2018-04-10 DIAGNOSIS — F819 Developmental disorder of scholastic skills, unspecified: Secondary | ICD-10-CM | POA: Diagnosis not present

## 2018-04-11 DIAGNOSIS — G4733 Obstructive sleep apnea (adult) (pediatric): Secondary | ICD-10-CM | POA: Diagnosis not present

## 2018-04-13 DIAGNOSIS — M170 Bilateral primary osteoarthritis of knee: Secondary | ICD-10-CM | POA: Diagnosis not present

## 2018-04-13 DIAGNOSIS — M1711 Unilateral primary osteoarthritis, right knee: Secondary | ICD-10-CM | POA: Diagnosis not present

## 2018-04-13 DIAGNOSIS — M1712 Unilateral primary osteoarthritis, left knee: Secondary | ICD-10-CM | POA: Diagnosis not present

## 2018-04-17 ENCOUNTER — Other Ambulatory Visit: Payer: Self-pay | Admitting: Neurology

## 2018-04-20 ENCOUNTER — Other Ambulatory Visit: Payer: Self-pay

## 2018-04-20 MED ORDER — NORTRIPTYLINE HCL 25 MG PO CAPS: 50.0000 mg | ORAL_CAPSULE | Freq: Every day | ORAL | 1 refills | Status: DC

## 2018-04-29 ENCOUNTER — Telehealth: Payer: Self-pay | Admitting: Neurology

## 2018-04-29 MED ORDER — NORTRIPTYLINE HCL 75 MG PO CAPS: 75.0000 mg | ORAL_CAPSULE | Freq: Every day | ORAL | 3 refills | Status: DC

## 2018-04-29 NOTE — Telephone Encounter (Signed)
Pt called wanting to know if his nortriptyline (PAMELOR) 25 MG capsule can be increased due to the fact that it is not working and he is not getting any sleep. Please advise.

## 2018-04-29 NOTE — Telephone Encounter (Signed)
I called the patient.  The patient may increase the nortriptyline to 75 mg at night, he will start taking 3 of the 25 mg capsules, I will call in a 75 mg capsule.

## 2018-04-29 NOTE — Telephone Encounter (Signed)
Patient called in and his meds are sent to wrong pharmacy . meds need to be sent to  [place], [place] [phone] (Phone) [phone] (Fax)

## 2018-04-29 NOTE — Telephone Encounter (Signed)
I called the patient back.  Because the medication dosing is being changed, I will call the prescription to a local pharmacy first, if it is well-tolerated and is effective, I will then call in a 90-day prescription to the mail order pharmacy.

## 2018-05-04 ENCOUNTER — Encounter (HOSPITAL_COMMUNITY): Payer: Self-pay

## 2018-05-04 ENCOUNTER — Encounter: Payer: Self-pay | Admitting: Pulmonary Disease

## 2018-05-04 ENCOUNTER — Emergency Department (HOSPITAL_COMMUNITY)
Admission: EM | Admit: 2018-05-04 | Discharge: 2018-05-04 | Disposition: A | Discharge status: Home / Self Care | Payer: Medicare HMO | Attending: Emergency Medicine | Admitting: Emergency Medicine

## 2018-05-04 ENCOUNTER — Telehealth: Payer: Self-pay | Admitting: Cardiology

## 2018-05-04 ENCOUNTER — Emergency Department (HOSPITAL_COMMUNITY): Payer: Medicare HMO

## 2018-05-04 ENCOUNTER — Ambulatory Visit (INDEPENDENT_AMBULATORY_CARE_PROVIDER_SITE_OTHER): Payer: Medicare HMO | Admitting: Pulmonary Disease

## 2018-05-04 ENCOUNTER — Other Ambulatory Visit: Payer: Self-pay

## 2018-05-04 VITALS — BP 120/60 | HR 41 | Ht 74.0 in | Wt 254.0 lb

## 2018-05-04 DIAGNOSIS — R079 Chest pain, unspecified: Secondary | ICD-10-CM | POA: Diagnosis present

## 2018-05-04 DIAGNOSIS — I493 Ventricular premature depolarization: Secondary | ICD-10-CM | POA: Insufficient documentation

## 2018-05-04 DIAGNOSIS — Z79899 Other long term (current) drug therapy: Secondary | ICD-10-CM | POA: Insufficient documentation

## 2018-05-04 DIAGNOSIS — Z87891 Personal history of nicotine dependence: Secondary | ICD-10-CM | POA: Diagnosis not present

## 2018-05-04 DIAGNOSIS — R0789 Other chest pain: Secondary | ICD-10-CM | POA: Diagnosis not present

## 2018-05-04 DIAGNOSIS — G4733 Obstructive sleep apnea (adult) (pediatric): Secondary | ICD-10-CM

## 2018-05-04 DIAGNOSIS — R002 Palpitations: Secondary | ICD-10-CM

## 2018-05-04 LAB — BASIC METABOLIC PANEL
Anion gap: 7 (ref 5–15)
BUN: 21 mg/dL — ABNORMAL HIGH (ref 6–20)
CO2: 22 mmol/L (ref 22–32)
Calcium: 8.7 mg/dL — ABNORMAL LOW (ref 8.9–10.3)
Chloride: 110 mmol/L (ref 98–111)
Creatinine, Ser: 1 mg/dL (ref 0.61–1.24)
GFR calc Af Amer: >60 mL/min (ref >60)
GFR calc non Af Amer: >60 mL/min (ref >60)
Glucose, Bld: 98 mg/dL (ref 70–99)
Potassium: 3.5 mmol/L (ref 3.5–5.1)
Sodium: 139 mmol/L (ref 135–145)

## 2018-05-04 LAB — CBC
HCT: 42.2 % (ref 39.0–52.0)
Hemoglobin: 13.9 g/dL (ref 13.0–17.0)
MCH: 31.3 pg (ref 26.0–34.0)
MCHC: 32.9 g/dL (ref 30.0–36.0)
MCV: 95 fL (ref 80.0–100.0)
Platelets: 161 10*3/uL (ref 150–400)
RBC: 4.44 MIL/uL (ref 4.22–5.81)
RDW: 12.6 % (ref 11.5–15.5)
WBC: 6.6 10*3/uL (ref 4.0–10.5)
nRBC: 0 % (ref 0.0–0.2)

## 2018-05-04 LAB — I-STAT TROPONIN, ED: Troponin i, poc: 0 ng/mL (ref 0.00–0.08)

## 2018-05-04 MED ORDER — SODIUM CHLORIDE 0.9% FLUSH
3.0000 mL | Freq: Once | INTRAVENOUS | Status: AC
  Administered 2018-05-04: 3 mL via INTRAVENOUS

## 2018-05-04 NOTE — Telephone Encounter (Signed)
  Patient is calling because his heart rate is very low and he is wondering if there is something that can be done for that. Heart rate runs around 41 and today he went to his PCP and when they took it his pulse was 27. He states that it doesn't get above 41. Please advise.

## 2018-05-04 NOTE — ED Notes (Signed)
Bed: WA16 Expected date:  Expected time:  Means of arrival:  Comments: Triage 1 

## 2018-05-04 NOTE — Telephone Encounter (Signed)
Pt called no answer voice mail left for pt to call back 

## 2018-05-04 NOTE — Telephone Encounter (Signed)
Spoke with pt who states he left pulmonologist office today after appt and was told during appt to follow up with cardiologist after automatic pulse reading of 27 and manual pulse of 41. He states he wants to report low pulse and symptoms since his 'sleep med' has been adjusted recently and may be adjusted again in the future. He lists nortriptyline as his 'sleep med'. He reports being put on sleep meds by his neurologist because he only sleeps for about 2 hours at night.  Pt states that along with bradycardia, current symptoms include being 'out of breath', c/o HA, tiredness, and confusion. Pt states that as he is driving with triage nurse on the phone he is very confused and has no idea where he is going. Advised pt to pull over on the side of the road if safe to do so. Pt able to demonstrate being A&Ox4. Pt able to pull over on the side of the road and continued to explain symptoms to triage nurse. He states that at times his chest 'hurts a little' and describes pain as a sharp pain that lasts for 15-20 mins and may, at times, be relieved with rest. He denies current CP. He reports that he 'passes out' from tiredness during the day time and has done so on his couch and on the toilet. Pt denies falls. He states that the last time he had what may be a syncopal episode was 2 days ago and it may have lasted a few hours. Advised pt to call 911 d/t current symptoms. But pt stated there was a gas station nearby and insisted on driving to gas station despite recommendation. Pt able to drive to gas station safely. Advised pt to call someone to come and pick him up and take him to the ED or call 911 for assistance. Pt questioned regarding oral intake for the day. Pt stated he had not had anything to eat or drink the entire day, but that he had a meal and drink in his car that he planned to eat when he arrived home. Advised pt to eat and drink the meal now. Strongly encouraged pt to call 911 and if unwilling, to at least  call someone to take him to ED for eval. Pt agreeable with attempting to call his friend and then calling 911 if friend unable to take him to ED. Pt scheduled for appt on 3/25. Will route to Dr. Percival Spanish and assist to see if pt able to be seen in office sooner

## 2018-05-04 NOTE — ED Provider Notes (Signed)
[facility] DEPT Provider Note   CSN: [id number] Arrival date & time: 05/04/18  1728    History   Chief Complaint Chief Complaint  Patient presents with  . Sent over from cardiologist  . Low Pulse  . Chest Pain    HPI Steven Vincent is a 53 y.o. male.     HPI  Patient reports he feels discomfort in his chest and is worried about his heart rate being low when he was at his pulmonary appointment this morning.  He has a history of cardiac arrhythmia has been comprehensively evaluated by cardiology.  He sees pulmonary for obstructive sleep apnea.  He is on CPAP.  He denies nausea, vomiting, focal weakness or paresthesia.  There are no other known modifying factors.   Past Medical History:  Diagnosis Date  . Arthritis   . Baker's cyst   . Headache(784.0)   . Knee pain   . Low back pain   . Migraines   . OSA (obstructive sleep apnea) 04/10/2017  . Personality disorder ([place])   . Seizures [facility])     Patient Active Problem List   Diagnosis Date Noted  . Allergic rhinitis 03/09/2018  . Insomnia 03/06/2018  . DJD (degenerative joint disease), lumbosacral 06/19/2017  . Bradycardia 04/14/2017  . OSA (obstructive sleep apnea) 04/10/2017  . Syncope 01/02/2017  . PVC (premature ventricular contraction) 01/02/2017  . Class 1 obesity with body mass index (BMI) of 30.0 to 30.9 in adult 11/13/2016  . Chest pain, unspecified 10/16/2016  . Hyperlipidemia 10/16/2016  . Radicular pain of left lower extremity 01/23/2016  . RLS (restless legs syndrome) 01/23/2016  . Hemiballismus 01/17/2015  . Migraine without aura, without mention of intractable migraine without mention of status migrainosus 01/19/2013  . Migraine 08/03/2010  . Hip pain, left 04/26/2010  . Hemorrhoids, internal, with bleeding 04/26/2010  . Daytime hypersomnolence 11/14/2009  . REACTIVE HYPOGLYCEMIA 04/13/2008  . WEIGHT GAIN 04/13/2008  . SCHIZOTYPAL PERSONALITY DISORDER 07/07/2007    . Low back pain with radiation 11/06/2006  . Convulsions ([place]) 11/06/2006  . Headache, unspecified headache type 11/06/2006    Past Surgical History:  Procedure Laterality Date  . TOOTH EXTRACTION  12/2014   lower left        Home Medications    Prior to Admission medications   Medication Sig Start Date End Date Taking? Authorizing Provider  baclofen (LIORESAL) 10 MG tablet Take 10 mg by mouth 2 (two) times daily. 10/22/17  Yes [provider]  diclofenac (FLECTOR) 1.3 % PTCH Place 1 patch onto the skin daily as needed (back and knee pain).   Yes [provider]  diclofenac sodium (VOLTAREN) 1 % GEL Apply topically as directed. Patient taking differently: Apply 2 g topically daily as needed (pain).  10/03/17  Yes [place], Betty G, MD  nortriptyline (PAMELOR) 75 MG capsule Take 1 capsule (75 mg total) by mouth at bedtime. 04/29/18  Yes Kathrynn Ducking, MD  rizatriptan (MAXALT) 10 MG tablet TAKE 1 TABLET AT ONSET OF MIGRAINE. MAY REPEAT IN 2 HOURS IF NEEDED. MAX 2 PER DAY. Patient taking differently: Take 10 mg by mouth as needed for migraine. TAKE 1 TABLET AT ONSET OF MIGRAINE. MAY REPEAT IN 2 HOURS IF NEEDED. MAX 2 PER DAY. 04/20/18  Yes Kathrynn Ducking, MD  rOPINIRole (REQUIP) 0.25 MG tablet TAKE 1 TO 2 TABLETS AT BEDTIME Patient taking differently: Take 0.25-0.5 mg by mouth at bedtime.  12/15/17  Yes [place], Betty G, MD  simvastatin (ZOCOR) 20 MG tablet TAKE 1 TABLET AT BEDTIME 02/17/18  Yes [place], Betty G, MD  topiramate (TOPAMAX) 50 MG tablet Take one tablet in the AM and two tablets in the PM. Patient taking differently: Take 50-100 mg by mouth daily. Take 50 mg in the AM and 100 mg in the PM. 06/25/17  Yes [place], Megan, NP  traMADol (ULTRAM) 50 MG tablet Take 50 mg by mouth daily as needed for moderate pain.  07/14/17  Yes [provider]  fluticasone (FLONASE) 50 MCG/ACT nasal spray Place 1 spray into both nostrils daily. Patient not taking: Reported  on 05/04/2018 03/09/18   Lauraine Rinne, NP    Family History Family History  Problem Relation Age of Onset  . Heart disease Father        defibrilator  . Hyperlipidemia Father   . Hypertension Father   . Alzheimer's disease Father   . Other Father        stents in legs  . Aneurysm Mother        brain  . Hypertension Brother   . Stroke Brother        X2    Social History Social History   Tobacco Use  . Smoking status: Former Research scientist (life sciences)  . Smokeless tobacco: Never Used  . Tobacco comment: states he only tried as a teenager  Substance Use Topics  . Alcohol use: No  . Drug use: No     Allergies   Aspirin   Review of Systems Review of Systems  All other systems reviewed and are negative.    Physical Exam Updated Vital Signs BP 131/90   Pulse (!) 34   Temp 98.5 F (36.9 C) (Oral)   Resp 15   SpO2 97%   Physical Exam Vitals signs and nursing note reviewed.  Constitutional:      General: He is not in acute distress.    Appearance: He is well-developed. He is not ill-appearing or diaphoretic.  HENT:     Head: Normocephalic and atraumatic.     Right Ear: External ear normal.     Left Ear: External ear normal.  Eyes:     Conjunctiva/sclera: Conjunctivae normal.     Pupils: Pupils are equal, round, and reactive to light.  Neck:     Musculoskeletal: Normal range of motion and neck supple.     Trachea: Phonation normal.  Cardiovascular:     Rate and Rhythm: Normal rate and regular rhythm.     Heart sounds: Normal heart sounds.     Comments: Evaluated when seen, with pulse right radial, 65, by me. Pulmonary:     Effort: Pulmonary effort is normal. No respiratory distress.     Breath sounds: Normal breath sounds. No stridor. No rhonchi.  Abdominal:     Palpations: Abdomen is soft.     Tenderness: There is no abdominal tenderness.  Musculoskeletal: Normal range of motion.  Skin:    General: Skin is warm and dry.  Neurological:     Mental Status: He is alert  and oriented to person, place, and time.     Cranial Nerves: No cranial nerve deficit.     Sensory: No sensory deficit.     Motor: No abnormal muscle tone.     Coordination: Coordination normal.  Psychiatric:        Mood and Affect: Mood normal.        Behavior: Behavior normal.        Thought Content: Thought content normal.  Judgment: Judgment normal.      ED Treatments / Results  Labs (all labs ordered are listed, but only abnormal results are displayed) Labs Reviewed  BASIC METABOLIC PANEL - Abnormal; Notable for the following components:      Result Value   BUN 21 (*)    Calcium 8.7 (*)    All other components within normal limits  CBC  I-STAT TROPONIN, ED    EKG EKG Interpretation  Date/Time:  Monday May 04 2018 17:35:32 EDT Ventricular Rate:  96 PR Interval:    QRS Duration: 78 QT Interval:  316 QTC Calculation: 305 R Axis:   -25 Text Interpretation:  Sinus rhythm Paired ventricular premature complexes Borderline left axis deviation Borderline repolarization abnormality Short QT interval Baseline wander in lead(s) V1 No old tracing to compare Confirmed by Daleen Bo [id number]) on 05/04/2018 6:23:39 PM   Radiology Dg Chest 2 View  Result Date: 05/04/2018 CLINICAL DATA:  Chest heaviness starting this morning. Confusion. Former smoker. EXAM: CHEST - 2 VIEW COMPARISON:  02/21/2016 FINDINGS: The heart size and mediastinal contours are within normal limits. Both lungs are clear. The visualized skeletal structures are unremarkable. IMPRESSION: No active cardiopulmonary disease. Electronically Signed   By: Lucienne Capers M.D.   On: 05/04/2018 19:13    Procedures Procedures (including critical care time)  Medications Ordered in ED Medications  sodium chloride flush (NS) 0.9 % injection 3 mL (3 mLs Intravenous Given 05/04/18 1813)     Initial Impression / Assessment and Plan / ED Course  I have reviewed the triage vital signs and the nursing  notes.  Pertinent labs & imaging results that were available during my care of the patient were reviewed by me and considered in my medical decision making (see chart for details).  Clinical Course as of May 03 2145  Mon May 04, 2018  2108 Normal  I-stat troponin, ED [EW]  2108 Normal  CBC [EW]  2108 Normal except BUN slightly elevated, calcium low  Basic metabolic panel(!) [EW]  1962 No infiltrate or CHF, images reviewed by me  DG Chest 2 View [EW]  2110 Orthostatic blood pressure and pulse were done and are normal.  Patient was able ambulate, with normal oxygen saturation, pulse by saturation monitor was noted to be around 40 during ambulation.  Patient stated he felt "tired while walking.   [EW]    Clinical Course User Index [EW] Daleen Bo, MD        Patient Vitals for the past 24 hrs:  BP Temp Temp src Pulse Resp SpO2  05/04/18 2100 131/90 - - (!) 34 15 97 %  05/04/18 2055 136/82 98.5 F (36.9 C) Oral (!) 41 15 99 %  05/04/18 2002 (!) 142/81 - - (!) 33 14 100 %  05/04/18 1904 129/89 - - 80 (!) 26 100 %  05/04/18 1833 (!) 146/78 - - 74 16 100 %  05/04/18 1830 (!) 146/78 - - 67 20 99 %  05/04/18 1800 (!) 150/68 - - 70 16 100 %  05/04/18 1739 - - - - - 99 %  05/04/18 1738 (!) 139/97 97.9 F (36.6 C) Oral 86 20 100 %    9:12 PM Reevaluation with update and discussion. After initial assessment and treatment, an updated evaluation reveals he is comfortable at this time and has no further complaints.  Findings discussed with the patient and all questions were answered. Daleen Bo   Medical Decision Making: Patient with concern for bradycardia has significant PVCs,  with low perfusion rate.  This is been previously evaluated by cardiology.  Patient has minimal symptoms with ambulation.  He is not on cardiac rate lowering medication, currently.  He follows closely with cardiology, last seen December 2019.  Not seen electrophysiology, previously.  CRITICAL  CARE-no Performed by: Daleen Bo  Nursing Notes Reviewed/ Care Coordinated Applicable Imaging Reviewed Interpretation of Laboratory Data incorporated into ED treatment  The patient appears reasonably screened and/or stabilized for discharge and I doubt any other medical condition or other [facility] requiring further screening, evaluation, or treatment in the ED at this time prior to discharge.  Plan: Home Medications-continue usual medications; Home Treatments-rest, fluids; return here if the recommended treatment, does not improve the symptoms; Recommended follow up-call cardiology in the morning to arrange follow-up care and consider referral to EP.  Follow-up with PCP, PRN   Final Clinical Impressions(s) / ED Diagnoses   Final diagnoses:  Palpitation  PVC's (premature ventricular contractions)    ED Discharge Orders    None       Daleen Bo, MD 05/04/18 2148

## 2018-05-04 NOTE — ED Triage Notes (Addendum)
Patient sent over from pulmonary doctor this morning who told him to call cardiologist. Pt called cardiologist and was told to come straight to ED.  Patient states he was suppose to come to ED after appointment this morning but " just didn't"   Pulse was 27-41 at office  Patient states his normal pulse is 35.   Patient states this morning his chest felt "heavy" that started this morning and has not went away.  Patient states he is always confused but feels more confused lately. Patient states he went down the wrong street.    Pulse in ED-78 BP-139/97 98% RA     Per caregiver patient is developmentally delayed.   Ambulatory in triage Able to speak in complete sentences.

## 2018-05-04 NOTE — ED Notes (Signed)
Pt was able to ambulate in the hall, but complained of some tiredness and some dizziness.

## 2018-05-04 NOTE — Patient Instructions (Signed)
Follow up in 1 year.

## 2018-05-04 NOTE — Progress Notes (Signed)
[facility]  Chief Complaint  Patient presents with  . Follow-up    Has trouble sleeping more then 2 hours a night.    Constitutional:  BP 120/60 (BP Location: Left Arm, Patient Position: Sitting, Cuff Size: Normal)   Pulse (!) 41   Ht 6\' 2"  (1.88 m)   Wt 254 lb (115.2 kg)   SpO2 100%   BMI 32.61 kg/m   Past Medical History:  HA, Low back pain, Migraine HA, Seizures, Personality disorder  Brief Summary:  Steven Vincent is a 53 y.o. male with obstructive sleep apnea.  He uses CPAP nightly.  Has no problem with mask fit or pressure.  Feels like this helps. He still has trouble sleeping through the night.  Only gets about 3 to 4 hours per night.  Had topamax dose increased last week.    Saw cardiology.  Had holter monitor done.  Still has low heart rate and episodes of dizziness.   Physical Exam:   Appearance - well kempt   ENMT - no sinus tenderness, no nasal discharge, no oral exudate, poor dentition  Neck - no masses, trachea midline, no thyromegaly, no elevation in JVP  Respiratory - normal appearance of chest wall, normal respiratory effort w/o accessory muscle use, no dullness on percussion, no wheezing or rales  CV - s1s2 regular rate and rhythm, no murmurs, no peripheral edema, radial pulses symmetric  GI - soft, non tender  Lymph - no adenopathy noted in neck and axillary areas  MSK - normal gait  Ext - no cyanosis, clubbing, or joint inflammation noted  Skin - no rashes, lesions, or ulcers  Neuro - normal strength, oriented x 3  Psych - normal mood and affect   Assessment/Plan:   Obstructive sleep apnea. -  He reports benefit from CPAP - discussed metrics for compliance - continue auto CPAP   Insomnia. - gets nortriptyline from his neurologist - will monitor sleep pattern after recent adjustment in medications he is prescribed nortriptyline from his neurologist  Bradycardia with pre-syncope. -  advised him to schedule f/u with cardiology if his symptoms get worse   Patient Instructions  Follow up in 1 year    [name], MD [place] Pager: [phone] 05/04/2018, 10:25 AM  Flow Sheet    Sleep tests:  PSG 02/15/17 >>AHI 6.9, SpO2 low 85%. Supine AHI 10.5, REM AHI 24.3. Auto CPAP 05/06/17 to 06/04/17 >> used on 28 of 30 nights with average 4 hrs 19 min.  Average AHI 3.4 with median CPAP 9 and 95 th percentile 12 cm H2O  Cardiac tests:  Echo 01/09/17 >> EF 55 to 60%  Medications:   Allergies as of 05/04/2018      Reactions   Aspirin    GI upset Other reaction(s): Vomiting      Medication List       Accurate as of May 04, 2018 10:25 AM. Always use your most recent med list.        baclofen 10 MG tablet Commonly known as:  LIORESAL Take 10 mg by mouth 2 (two) times daily.   diclofenac sodium 1 % Gel Commonly known as:  VOLTAREN Apply topically as directed.   fluticasone 50 MCG/ACT nasal spray Commonly known as:  FLONASE Place 1 spray into both nostrils daily.   nortriptyline 75 MG capsule Commonly known as:  PAMELOR Take 1 capsule (75 mg total) by mouth at bedtime.   rizatriptan 10 MG tablet Commonly  known as:  MAXALT TAKE 1 TABLET AT ONSET OF MIGRAINE. MAY REPEAT IN 2 HOURS IF NEEDED. MAX 2 PER DAY.   rOPINIRole 0.25 MG tablet Commonly known as:  REQUIP TAKE 1 TO 2 TABLETS AT BEDTIME   simvastatin 20 MG tablet Commonly known as:  ZOCOR TAKE 1 TABLET AT BEDTIME   topiramate 50 MG tablet Commonly known as:  TOPAMAX Take one tablet in the AM and two tablets in the PM.   traMADol 50 MG tablet Commonly known as:  ULTRAM daily as needed.       Past Surgical History:  He  has a past surgical history that includes Tooth extraction (12/2014).  Family History:  His family history includes Alzheimer's disease in his father; Aneurysm in his mother; Heart disease in his father; Hyperlipidemia in his father; Hypertension  in his brother and father; Other in his father; Stroke in his brother.  Social History:  He  reports that he has quit smoking. He has never used smokeless tobacco. He reports that he does not drink alcohol or use drugs.

## 2018-05-04 NOTE — ED Notes (Signed)
Pt resting. Reports feeling chest heaviness when his pulse is low. No acute distress. Pt mentating and responding appropriately. Provider aware.

## 2018-05-04 NOTE — Discharge Instructions (Addendum)
Your irregular heartbeat is likely caused by PVCs, which do not currently appear to be causing problems.  Sometimes these worsen and require additional treatment including pacemaker placement.  Your cardiologist can help you with referral to an electrophysiologist, at his discretion.  Please call him in the morning to discuss follow-up care and treatment.

## 2018-05-07 NOTE — Telephone Encounter (Signed)
Needs to go to ED with these symptoms.

## 2018-05-10 DIAGNOSIS — G4733 Obstructive sleep apnea (adult) (pediatric): Secondary | ICD-10-CM | POA: Diagnosis not present

## 2018-05-19 ENCOUNTER — Telehealth: Payer: Self-pay | Admitting: Pulmonary Disease

## 2018-05-19 NOTE — Telephone Encounter (Signed)
Spoke with pt.  Pt had to turn in his CPAP to [employer] for not enough usage or not being compliant.  Pt stated he needs his CPAP due to comorbidities.  Pt is now using a sleep medicine and stated it was getting better toward the end. Download is available on [employer].  It stated avg. Usage was 4:51, avg leak 45.5, avg AHI 2.1, avg CA index 0.6, avg OA index 0.6, 90% CPAP 10.8.    Dr [name] please advise on how to get CPAP back.

## 2018-05-20 ENCOUNTER — Ambulatory Visit: Payer: 59 | Admitting: Adult Health

## 2018-05-20 NOTE — Telephone Encounter (Signed)
Pt is calling back [phone]

## 2018-05-20 NOTE — Telephone Encounter (Signed)
What kind of insurance does he actually have? He is listed as having [employer].  He is not over age 53, so how can he have medicare.  Does he have medicaid?  If he has [employer], then he should be able to have a home sleep study.  If he has medicaid, then he would need to have an in lab study.

## 2018-05-20 NOTE — Telephone Encounter (Signed)
Please arrange for tele-visit with one of the NPs and then set up home sleep study.

## 2018-05-20 NOTE — Telephone Encounter (Signed)
Called and spoke with patient regarding what insurance he has for sure Pt has [employer] as primary, and medicaid as secondary Pt was not compliant with cpap machine, turned it in on 05/05/2018 to [facility] Pt states he is seeing cardiologist to determine if pace make is needed now or not Pt feels that he needs to have the cpap machine, and would like to start the process over Talked with [facility], Vickie states that ov is needed- during time of Covid19 televisit is okay And a home sleep study will be needed as both sleep study and ov needs to be within 30days of one another Pt expressed and verbalized understanding  VS please advise. Thank you

## 2018-05-20 NOTE — Telephone Encounter (Signed)
Please check with his DME whether a repeat sleep study is needed.  If so, then he will need to get set up for a home sleep study but this will be delayed until Corona virus pandemic has resolved.  If he doesn't need home sleep study, then send order for auto CPAP range 5 to 15 cm H2O with heated humidity and mask of choice.

## 2018-05-20 NOTE — Telephone Encounter (Signed)
F/U Message         Patient is calling back about his c-pap machine going back because of insurance reasons. Patient is needing a call back concerning. Pls call [phone] New # for now.

## 2018-05-20 NOTE — Telephone Encounter (Signed)
I spoke with [facility] and they stated the patient would have to have a in lab sleep study to abide by Medicare guidelines. She states the pt was telling her that he has received some medication to help him sleep and he is also getting a pacemaker placed soon. VS did you want [place] to wait to order in lab sleep study?

## 2018-05-20 NOTE — Telephone Encounter (Signed)
Pt is calling back [phone]

## 2018-05-21 ENCOUNTER — Other Ambulatory Visit: Payer: Self-pay

## 2018-05-21 ENCOUNTER — Ambulatory Visit (INDEPENDENT_AMBULATORY_CARE_PROVIDER_SITE_OTHER): Payer: 59 | Admitting: Nurse Practitioner

## 2018-05-21 ENCOUNTER — Encounter: Payer: Self-pay | Admitting: Nurse Practitioner

## 2018-05-21 ENCOUNTER — Encounter: Payer: Self-pay | Admitting: General Surgery

## 2018-05-21 DIAGNOSIS — G4733 Obstructive sleep apnea (adult) (pediatric): Secondary | ICD-10-CM | POA: Diagnosis not present

## 2018-05-21 NOTE — Progress Notes (Signed)
Virtual Visit via Telephone Note  I connected with Steven Vincent on 05/21/18 at  1:30 PM EDT by telephone and verified that I am speaking with the correct person using two identifiers.   I discussed the limitations, risks, security and privacy concerns of performing an evaluation and management service by telephone and the availability of in person appointments. I also discussed with the patient that there may be a patient responsible charge related to this service. The patient expressed understanding and agreed to proceed.   History of Present Illness:  53 year old male with obstructive sleep apnea followed by Dr. [name].  Patient's tele- visit today is for follow-up on OSA/CPAP.  Patient states that his CPAP was recently taken away from him due to noncompliance.  He wears his CPAP nightly but does not sleep a lot at night.  He states that his machine was returned because he had too many nights with CPAP not showing more than 4 hours use per night.  Patient has trouble sleeping through the night.  He only gets about 3 to 4 hours of sleep per night.  He is compliant with CPAP when he does sleep at night and he does benefit from the use of CPAP.  Per Dr. Juanetta Gosling last note he does want him to continue on CPAP. Denies f/c/s, n/v/d, hemoptysis, PND, leg swelling.    Observations/Objective:  PSG 02/15/17 >>AHI 6.9, SpO2 low 85%. Supine AHI 10.5, REM AHI 24.3. Auto CPAP 05/06/17 to 06/04/17 >> used on 28 of 30 nights with average 4 hrs 19 min.  Average AHI 3.4 with median CPAP 9 and 95 th percentile 12 cm H2O  HST 02/26/18 >> AHI 11.6, SpO2 low 79%   Assessment and Plan: Patient's tele- visit today is for follow-up on OSA/CPAP.  Patient states that his CPAP was recently taken away from him due to noncompliance.  He wears his CPAP nightly but does not sleep a lot at night.  He states that his machine was returned because he had too many nights with CPAP not showing more than 4 hours use per night.   Patient has trouble sleeping through the night.  He only gets about 3 to 4 hours of sleep per night.  He is compliant with CPAP when he does sleep at night and he does benefit from the use of CPAP.  We would like to reorder his CPAP.  Per [facility] patient will need to repeat a home sleep study in order to get his CPAP back.  Will order HST Auto CPAP range 5 to 15 cm H2O with heated humidity and mask of choice Continue current medications Goal of 4 hours or more usage per night Maintain healthy weight Do not drive if drowsy   Follow Up Instructions:  Follow up with Dr. [name] in 1 month after new CPAP start or sooner if needed  I discussed the assessment and treatment plan with the patient. The patient was provided an opportunity to ask questions and all were answered. The patient agreed with the plan and demonstrated an understanding of the instructions.   The patient was advised to call back or seek an in-person evaluation if the symptoms worsen or if the condition fails to improve as anticipated.  I provided 22 minutes of non-face-to-face time during this encounter.   Fenton Foy, NP

## 2018-05-21 NOTE — Telephone Encounter (Signed)
Spoke with pt. He has been scheduled with Tonya at 1:30pm today per his request for an early afternoon appointment. Nothing further was needed.

## 2018-05-21 NOTE — Patient Instructions (Signed)
Will order HST Auto CPAP range 5 to 15 cm H2O with heated humidity and mask of choice Continue current medications Goal of 4 hours or more usage per night Maintain healthy weight Do not drive if drowsy

## 2018-05-21 NOTE — Assessment & Plan Note (Addendum)
Patient's tele- visit today is for follow-up on OSA/CPAP.  Patient states that his CPAP was recently taken away from him due to noncompliance.  He wears his CPAP nightly but does not sleep a lot at night.  He states that his machine was returned because he had too many nights with CPAP not showing more than 4 hours use per night.  Patient has trouble sleeping through the night.  He only gets about 3 to 4 hours of sleep per night.  He is compliant with CPAP when he does sleep at night and he does benefit from the use of CPAP.  We would like to reorder his CPAP.  Per [facility] patient will need to repeat a home sleep study in order to get his CPAP back.  Will order HST Auto CPAP range 5 to 15 cm H2O with heated humidity and mask of choice Continue current medications Goal of 4 hours or more usage per night Maintain healthy weight Do not drive if drowsy   Follow up with Dr. [name] in 1 month after new CPAP start or sooner if needed

## 2018-05-22 ENCOUNTER — Other Ambulatory Visit: Payer: Self-pay | Admitting: Family Medicine

## 2018-05-25 ENCOUNTER — Other Ambulatory Visit: Payer: Self-pay | Admitting: Adult Health

## 2018-05-25 MED ORDER — NORTRIPTYLINE HCL 75 MG PO CAPS: 75.0000 mg | ORAL_CAPSULE | Freq: Every day | ORAL | 2 refills | Status: DC

## 2018-05-25 NOTE — Addendum Note (Signed)
Addended by: Minna Antis on: 05/25/2018 03:21 PM   Modules accepted: Orders

## 2018-05-25 NOTE — Telephone Encounter (Signed)
Pt has called for a refill on his nortriptyline (PAMELOR) 75 MG capsule [facility] Mail Delivery

## 2018-05-25 NOTE — Telephone Encounter (Signed)
Refilled nortriptyline to mail service x 3 months. Previous refill to [facility].

## 2018-05-26 ENCOUNTER — Telehealth: Payer: Self-pay

## 2018-05-26 NOTE — Telephone Encounter (Signed)
Patient walked in office today.He stated he called a few days ago and never heard back from our office.After reviewing chart he was advised to go to [facility] ED.Stated he did go to ED and was told he needed appointment to see cardiology.Appointment was already scheduled with Jory Sims DNP 06/08/18 at 3:00 pm.Stated he was feeling good at present,no complaints.Advised he will be called a few days before visit.Visit may changed to a tele visit.

## 2018-06-01 ENCOUNTER — Telehealth: Payer: Self-pay

## 2018-06-01 DIAGNOSIS — F2089 Other schizophrenia: Secondary | ICD-10-CM | POA: Diagnosis not present

## 2018-06-01 DIAGNOSIS — F819 Developmental disorder of scholastic skills, unspecified: Secondary | ICD-10-CM | POA: Diagnosis not present

## 2018-06-02 NOTE — Telephone Encounter (Signed)
140/83 42-needs pacemaker per ER visit d/t low HR and  h/o seizures and fatigue-all day. Pt states that he went to the store and he states that he was "walking drunk" he was so tired and went to bed and states that he went right to sleep, not sleeping at night and is tired all day. Since the "state took his CPAP away. Will switch appt to telephone visit and defer to [facility] for further instructions

## 2018-06-02 NOTE — Telephone Encounter (Signed)
Follow up:   Patient returning call back. please call the patient back

## 2018-06-02 NOTE — Telephone Encounter (Signed)
Patient returning your call- regarding appointment for 06/08/2018

## 2018-06-02 NOTE — Telephone Encounter (Signed)
I think he needs to be seen in the office, not a telephone visit. IF he is that symptomatic he needs EKG and in person assessment.

## 2018-06-02 NOTE — Telephone Encounter (Signed)
calle pt and rescheduled appt to tomorrow with DOD

## 2018-06-03 ENCOUNTER — Encounter: Payer: Self-pay | Admitting: Cardiology

## 2018-06-03 ENCOUNTER — Ambulatory Visit (INDEPENDENT_AMBULATORY_CARE_PROVIDER_SITE_OTHER): Payer: Medicare HMO | Admitting: Cardiology

## 2018-06-03 ENCOUNTER — Other Ambulatory Visit: Payer: Self-pay | Admitting: Cardiology

## 2018-06-03 ENCOUNTER — Other Ambulatory Visit: Payer: Self-pay

## 2018-06-03 VITALS — BP 110/58 | HR 75 | Ht 74.0 in | Wt 253.4 lb

## 2018-06-03 DIAGNOSIS — R001 Bradycardia, unspecified: Secondary | ICD-10-CM

## 2018-06-03 DIAGNOSIS — I493 Ventricular premature depolarization: Secondary | ICD-10-CM | POA: Diagnosis not present

## 2018-06-03 DIAGNOSIS — G4733 Obstructive sleep apnea (adult) (pediatric): Secondary | ICD-10-CM

## 2018-06-03 MED ORDER — DRONEDARONE HCL 400 MG PO TABS: 400.0000 mg | ORAL_TABLET | Freq: Two times a day (BID) | ORAL | 11 refills | Status: DC

## 2018-06-03 NOTE — Progress Notes (Signed)
Cardiology Office Note   Date:  06/03/2018   ID:  Steven Vincent, DOB 1965/04/28, MRN [id number]  PCP:  Martinique, Betty G, MD  Cardiologist:  Marijo File MD  Chief Complaint  Patient presents with  . Dizziness    states a little dizzy getting off the elevator   . Shortness of Breath    with/with out activity   . Chest Pain    feels a like a little weight and sometimes feels like a little prick   . Leg Pain    left leg and hip pain       History of Present Illness: Steven Vincent is a 53 y.o. male who presents for evaluation of PVCs and bradycardia. He has been evaluated by Dr. Percival Spanish for PVCs and bradycardia in the past. POET in January 2018 was normal. Holter in October 2018 showed frequent PVCs with burden 16%. Average HR 66. Slowest HR 40 while asleep. Beta blocker was reduced and eventually stopped. Echo was normal. Reevaluated in April  2019 with a [employer]. This showed a small fixed apical defect and was otherwise normal. He had repeat Holter in December 2019 showing again frequent PVCs with a burden of 26%. 4 beat run NSVT. Minimum HR 58. It was felt that periods of low pulse rate were more related to undercounting PVCs. He does have sleep apnea and is on CPAP therapy. He was seen in the ED on March 9 with concern about slow HR, chest pain, and fatigue. Noted to have pulse of 40 by pulse ox reading. Ecg showed frequent PVCs with bigeminy and couplets.   Patient feels tired a lot, dizzy, and does have pressure in his chest. States he gets pale when active. Sweats like crazy and also gets SOB. Chest pressure is usually with activity. Felt dizzy just getting off elevator today. Feels drunk a lot. He notes these symptoms have been going on for 2 years. He has some stressors including going through a divorce, his father dying, and his service dog was attacked. He does have a learning disability.  Past Medical History:  Diagnosis Date  . Arthritis   . Baker's cyst    . Headache(784.0)   . Knee pain   . Low back pain   . Migraines   . OSA (obstructive sleep apnea) 04/10/2017  . Personality disorder ([place])   . Seizures ([place])     Past Surgical History:  Procedure Laterality Date  . TOOTH EXTRACTION  12/2014   lower left     Current Outpatient Medications  Medication Sig Dispense Refill  . baclofen (LIORESAL) 10 MG tablet Take 10 mg by mouth 2 (two) times daily.  0  . diclofenac (FLECTOR) 1.3 % PTCH Place 1 patch onto the skin daily as needed (back and knee pain).    Marland Kitchen diclofenac sodium (VOLTAREN) 1 % GEL Apply topically as directed. (Patient taking differently: Apply 2 g topically daily as needed (pain). ) 1 Tube 1  . fluticasone (FLONASE) 50 MCG/ACT nasal spray Place 1 spray into both nostrils daily. 16 g 3  . nortriptyline (PAMELOR) 75 MG capsule Take 1 capsule (75 mg total) by mouth at bedtime. 30 capsule 2  . rizatriptan (MAXALT) 10 MG tablet TAKE 1 TABLET AT ONSET OF MIGRAINE. MAY REPEAT IN 2 HOURS IF NEEDED. MAX 2 PER DAY. (Patient taking differently: Take 10 mg by mouth as needed for migraine. TAKE 1 TABLET AT ONSET OF MIGRAINE. MAY REPEAT IN 2 HOURS  IF NEEDED. MAX 2 PER DAY.) 9 tablet 3  . rOPINIRole (REQUIP) 0.25 MG tablet Take 1-2 tablets (0.25-0.5 mg total) by mouth at bedtime. 90 tablet 1  . simvastatin (ZOCOR) 20 MG tablet TAKE 1 TABLET AT BEDTIME 90 tablet 1  . topiramate (TOPAMAX) 50 MG tablet Take one tablet in the AM and two tablets in the PM. (Patient taking differently: Take 50-100 mg by mouth daily. Take 50 mg in the AM and 100 mg in the PM.) 270 tablet 3  . traMADol (ULTRAM) 50 MG tablet Take 50 mg by mouth daily as needed for moderate pain.     Marland Kitchen dronedarone (MULTAQ) 400 MG tablet Take 1 tablet (400 mg total) by mouth 2 (two) times daily with a meal. 60 tablet 11   No current facility-administered medications for this visit.     Allergies:   Aspirin    Social History:  The patient  reports that he has quit smoking. He has  never used smokeless tobacco. He reports that he does not drink alcohol or use drugs.   Family History:  The patient's family history includes Alzheimer's disease in his father; Aneurysm in his mother; Heart disease in his father; Hyperlipidemia in his father; Hypertension in his brother and father; Other in his father; Stroke in his brother.    ROS:  Please see the history of present illness.   Otherwise, review of systems are positive for none.   All other systems are reviewed and negative.    PHYSICAL EXAM: VS:  BP (!) 110/58   Pulse 75   Ht 6\' 2"  (1.88 m)   Wt 253 lb 6.4 oz (114.9 kg)   BMI 32.53 kg/m  , BMI Body mass index is 32.53 kg/m. GEN: Well nourished, obese in no acute distress  HEENT: normal  Neck: no JVD, carotid bruits, or masses Cardiac: RRR; no murmurs, rubs, or gallops,no edema  Respiratory:  clear to auscultation bilaterally, normal work of breathing GI: soft, nontender, nondistended, + BS MS: no deformity or atrophy  Skin: warm and dry, no rash Neuro:  Strength and sensation are intact Psych: euthymic mood, full affect   EKG:  EKG is ordered today. The ekg ordered today demonstrates NSR with frequent PVCs. Normal QT. I have personally reviewed and interpreted this study.    Recent Labs: 05/04/2018: BUN 21; Creatinine, Ser 1.00; Hemoglobin 13.9; Platelets 161; Potassium 3.5; Sodium 139    Lipid Panel    Component Value Date/Time   CHOL 174 09/30/2017 1019   TRIG 326.0 (H) 09/30/2017 1019   TRIG 231 (HH) 12/27/2005 1005   HDL 23.60 (L) 09/30/2017 1019   CHOLHDL 7 09/30/2017 1019   VLDL 65.2 (H) 09/30/2017 1019   LDLCALC 74 10/16/2016 0900   LDLDIRECT 107.0 09/30/2017 1019      Wt Readings from Last 3 Encounters:  06/03/18 253 lb 6.4 oz (114.9 kg)  05/04/18 254 lb (115.2 kg)  03/16/18 254 lb (115.2 kg)      Other studies Reviewed: Additional studies/ records that were reviewed today include:  ETT 03/07/16: Study Highlights     Blood  pressure demonstrated a normal response to exercise.  There was no ST segment deviation noted during stress.  No T wave inversion was noted during stress.   Monomorphic PVCs increase in frequency with increased exercise level. Otherwise normal stress ECG, without evidence of exercise induced ischemia.    Echo 01/09/17: Study Conclusions  - Left ventricle: The cavity size was normal. Wall thickness  was   increased in a pattern of moderate LVH. Systolic function was   normal. The estimated ejection fraction was in the range of 55%   to 60%. Wall motion was normal; there were no regional wall   motion abnormalities. Left ventricular diastolic function   parameters were normal. - Mitral valve: There was trivial regurgitation  Myoview 05/28/17: Study Highlights     Nuclear stress EF: 56%. No wall motion abnormalities  There was no ST segment deviation noted during stress.  Defect 1: There is a small defect of mild severity present in the apex location. No ischemia identified.  This is a low risk study. Apical defect likely secondary to tissue attenuation artifact.   Candee Furbish, MD      ASSESSMENT AND PLAN:  1.  PVCs with high burden and symptoms that may be related to underperfused beats. Intolerant of beta blockers since this just reduced his effective pulse. Given symptoms I think we need to consider antiarrhythmic drug therapy. Due to interactions with his current medications I would not use Flecainide. I discussed with Dr Rayann Heman and Multaq would be a reasonable option. Will start 400 mg bid with meals. Insurance coverage may be an issue but will send to [facility] mail order and have given his a 0 dollar co pay card. Will arrange follow up with EP in a few weeks. If AAD therapy does not work he may be a candidate for ablation for his PVCs.    Current medicines are reviewed at length with the patient today.  The patient does not have concerns regarding medicines.  The  following changes have been made:  See above  Labs/ tests ordered today include:   Orders Placed This Encounter  Procedures  . EKG 12-Lead     Disposition:   FU with EP in 6 weeks.   Signed, Peter Martinique, MD  06/03/2018 10:53 AM    [facility] [address] [place], [place], [address] Phone [phone], Fax [phone]

## 2018-06-03 NOTE — Telephone Encounter (Signed)
°*  STAT* If patient is at the pharmacy, call can be transferred to refill team.   1. Which medications need to be refilled? (please list name of each medication and dose if known) Multaq 400 mg, member ID [id number]  2. Which pharmacy/location (including street and city if local pharmacy) is medication to be sent to? [phone] mail order  3. Do they need a 30 day or 90 day supply? [place]

## 2018-06-03 NOTE — Patient Instructions (Signed)
We will try Multaq 400 mg twice a day with meals. Let me know if this turns out to be too expensive.  We will arrange for follow up with one of our rhythm specialist in 6 weeks.

## 2018-06-04 ENCOUNTER — Other Ambulatory Visit: Payer: Self-pay | Admitting: *Deleted

## 2018-06-04 MED ORDER — SIMVASTATIN 20 MG PO TABS: 20.0000 mg | ORAL_TABLET | Freq: Every day | ORAL | 3 refills | Status: DC

## 2018-06-05 ENCOUNTER — Other Ambulatory Visit: Payer: Self-pay

## 2018-06-05 ENCOUNTER — Telehealth: Payer: Self-pay | Admitting: Pharmacist Clinician (PhC)/ Clinical Pharmacy Specialist

## 2018-06-05 ENCOUNTER — Telehealth: Payer: Self-pay

## 2018-06-05 MED ORDER — DRONEDARONE HCL 400 MG PO TABS: 400.0000 mg | ORAL_TABLET | Freq: Two times a day (BID) | ORAL | 11 refills | Status: DC

## 2018-06-05 NOTE — Telephone Encounter (Signed)
LMOM - patient was just started on Multaq by Dr. Martinique earlier this week.  Currently takes simvastatin 20 mg daily.  Last LDL was 09/2017 at 107.   Recommendation is not to take higher than simvastatin 10 mg with Multaq.  Will switch patient to rosuvastatin 20 mg.

## 2018-06-05 NOTE — Telephone Encounter (Signed)
Thank you for sending that interaction my way.  Please do not refill the simvastatin, I have a phone message out to the patient to get him a different cholesterol medication.  Good catch.

## 2018-06-08 ENCOUNTER — Telehealth: Payer: Medicare HMO | Admitting: Adult Health

## 2018-06-08 ENCOUNTER — Telehealth: Payer: Self-pay | Admitting: Family Medicine

## 2018-06-08 NOTE — Telephone Encounter (Signed)
Spoke with patient this am.  He is about out of the samples of Multaq from Dr. Martinique. Waiting for mail order to arrive.  He did not take it over the weekend as he was unsure about this drug interaction.  I advised he restart the Multaq this am and I will contact the office to see if we have any more samples to keep him going.   Explained the need to switch off the simvastatin to rosuvastatin, patient understands and will stop the simvastatin and wait for the rosuvastatin to come from mail order.

## 2018-06-08 NOTE — Telephone Encounter (Signed)
Steven Vincent- Pharmacist with [facility] would like someone to call her as well [phone] ext [phone].

## 2018-06-08 NOTE — Telephone Encounter (Signed)
Steven Vincent with [facility] called to get clarification on medications.  Is patient supposed to be taking simvastatin and multaq? Patient is confused about simvastatin that he is not supposed to be taking it and if there is an alternative. Please contact [facility] ext [phone]

## 2018-06-08 NOTE — Telephone Encounter (Signed)
Please advise 

## 2018-06-08 NOTE — Telephone Encounter (Signed)
Pt states his cardiologist put him on Dronedarone 400 MG BID ordered 06/05/2018. States he was told by cardiologist's nurse his Zocor would interfere with that medication. States he was advised to call Dr. Martinique and have an alternative ordered. If appropriate call in to [facility], [place] CB# [phone]   States may leave a detailed message.

## 2018-06-08 NOTE — Telephone Encounter (Signed)
Dr. Jordan please advise  

## 2018-06-09 ENCOUNTER — Other Ambulatory Visit: Payer: Self-pay

## 2018-06-09 ENCOUNTER — Other Ambulatory Visit: Payer: Self-pay | Admitting: *Deleted

## 2018-06-09 DIAGNOSIS — G894 Chronic pain syndrome: Secondary | ICD-10-CM | POA: Diagnosis not present

## 2018-06-09 DIAGNOSIS — M1990 Unspecified osteoarthritis, unspecified site: Secondary | ICD-10-CM | POA: Diagnosis not present

## 2018-06-09 MED ORDER — PRAVASTATIN SODIUM 20 MG PO TABS: 20.0000 mg | ORAL_TABLET | Freq: Every day | ORAL | 3 refills | Status: DC

## 2018-06-09 MED ORDER — DRONEDARONE HCL 400 MG PO TABS: 400.0000 mg | ORAL_TABLET | Freq: Two times a day (BID) | ORAL | 11 refills | Status: DC

## 2018-06-09 NOTE — Telephone Encounter (Signed)
Left message to return call to office concerning medication. 

## 2018-06-09 NOTE — Telephone Encounter (Signed)
Spoke with the patient and the pharmacy and clarified medication.

## 2018-06-09 NOTE — Telephone Encounter (Signed)
Spoke with patient and confirmed that Pravastatin 20 mg daily sent to the pharmacy.

## 2018-06-09 NOTE — Telephone Encounter (Signed)
Copied from [place] [id number]. Topic: General - Call Back - No Documentation >> Jun 09, 2018 11:39 AM Steven Vincent wrote: Reason for CRM:   Pt states he is returning a call from this morning that he missed.  States it may have been about medication.

## 2018-06-09 NOTE — Telephone Encounter (Signed)
Pt called back in and is expecting a call back

## 2018-06-09 NOTE — Telephone Encounter (Signed)
Options: Change to Pravastatin 20 mg daily OR Crestor 10 mg daily. Thanks, BJ

## 2018-06-09 NOTE — Telephone Encounter (Signed)
Medication clarification given to Jenn at the pharmacy that Simvastatin has been dc'd and patient is taking Pravastatin 20 mg daily.

## 2018-06-15 DIAGNOSIS — M2560 Stiffness of unspecified joint, not elsewhere classified: Secondary | ICD-10-CM | POA: Diagnosis not present

## 2018-06-15 DIAGNOSIS — M5416 Radiculopathy, lumbar region: Secondary | ICD-10-CM | POA: Diagnosis not present

## 2018-06-15 DIAGNOSIS — M545 Low back pain: Secondary | ICD-10-CM | POA: Diagnosis not present

## 2018-06-15 DIAGNOSIS — M6281 Muscle weakness (generalized): Secondary | ICD-10-CM | POA: Diagnosis not present

## 2018-06-18 DIAGNOSIS — M2560 Stiffness of unspecified joint, not elsewhere classified: Secondary | ICD-10-CM | POA: Diagnosis not present

## 2018-06-18 DIAGNOSIS — M545 Low back pain: Secondary | ICD-10-CM | POA: Diagnosis not present

## 2018-06-18 DIAGNOSIS — M5416 Radiculopathy, lumbar region: Secondary | ICD-10-CM | POA: Diagnosis not present

## 2018-06-18 DIAGNOSIS — M6281 Muscle weakness (generalized): Secondary | ICD-10-CM | POA: Diagnosis not present

## 2018-06-20 ENCOUNTER — Other Ambulatory Visit: Payer: Self-pay | Admitting: Neurology

## 2018-06-22 DIAGNOSIS — M5416 Radiculopathy, lumbar region: Secondary | ICD-10-CM | POA: Diagnosis not present

## 2018-06-22 DIAGNOSIS — F819 Developmental disorder of scholastic skills, unspecified: Secondary | ICD-10-CM | POA: Diagnosis not present

## 2018-06-22 DIAGNOSIS — M6281 Muscle weakness (generalized): Secondary | ICD-10-CM | POA: Diagnosis not present

## 2018-06-22 DIAGNOSIS — F2089 Other schizophrenia: Secondary | ICD-10-CM | POA: Diagnosis not present

## 2018-06-22 DIAGNOSIS — M2560 Stiffness of unspecified joint, not elsewhere classified: Secondary | ICD-10-CM | POA: Diagnosis not present

## 2018-06-22 DIAGNOSIS — M545 Low back pain: Secondary | ICD-10-CM | POA: Diagnosis not present

## 2018-06-24 DIAGNOSIS — M5416 Radiculopathy, lumbar region: Secondary | ICD-10-CM | POA: Diagnosis not present

## 2018-06-24 DIAGNOSIS — M6281 Muscle weakness (generalized): Secondary | ICD-10-CM | POA: Diagnosis not present

## 2018-06-24 DIAGNOSIS — M2560 Stiffness of unspecified joint, not elsewhere classified: Secondary | ICD-10-CM | POA: Diagnosis not present

## 2018-06-24 DIAGNOSIS — M545 Low back pain: Secondary | ICD-10-CM | POA: Diagnosis not present

## 2018-06-29 ENCOUNTER — Other Ambulatory Visit: Payer: Self-pay | Admitting: *Deleted

## 2018-06-29 DIAGNOSIS — M545 Low back pain: Secondary | ICD-10-CM | POA: Diagnosis not present

## 2018-06-29 DIAGNOSIS — M6281 Muscle weakness (generalized): Secondary | ICD-10-CM | POA: Diagnosis not present

## 2018-06-29 DIAGNOSIS — M2560 Stiffness of unspecified joint, not elsewhere classified: Secondary | ICD-10-CM | POA: Diagnosis not present

## 2018-06-29 DIAGNOSIS — M5416 Radiculopathy, lumbar region: Secondary | ICD-10-CM | POA: Diagnosis not present

## 2018-06-29 NOTE — Telephone Encounter (Signed)
[facility] [employer] request for PA on 90 day supply of rizatriptan. [id number]. Rizatriptan 12 tablets/ 30 days approved, ? 90 days if cost benefit for pt.  Determination pending.

## 2018-06-30 ENCOUNTER — Encounter: Payer: Self-pay | Admitting: Pulmonary Disease

## 2018-06-30 MED ORDER — RIZATRIPTAN BENZOATE 10 MG PO TABS: ORAL_TABLET | ORAL | 5 refills | Status: DC

## 2018-06-30 NOTE — Telephone Encounter (Signed)
Received denial for PA for rizatriptan.  I spoke to [place] at [facility].  She states that this medication per pts plan requires 30 day suppy.  (last fills 05-02-18, 05-22-18 and 06-08-18 for 9 tabs).  To soon to get another refill.  I attempted to let pt know but not working #. The maximum dispensing limit is 36 tabs in 90 days.

## 2018-07-01 ENCOUNTER — Telehealth: Payer: Self-pay | Admitting: *Deleted

## 2018-07-01 DIAGNOSIS — M545 Low back pain: Secondary | ICD-10-CM | POA: Diagnosis not present

## 2018-07-01 DIAGNOSIS — M6281 Muscle weakness (generalized): Secondary | ICD-10-CM | POA: Diagnosis not present

## 2018-07-01 DIAGNOSIS — M2560 Stiffness of unspecified joint, not elsewhere classified: Secondary | ICD-10-CM | POA: Diagnosis not present

## 2018-07-01 DIAGNOSIS — M5416 Radiculopathy, lumbar region: Secondary | ICD-10-CM | POA: Diagnosis not present

## 2018-07-01 NOTE — Telephone Encounter (Signed)
Left message for patient to schedule virtual visit with pcp.

## 2018-07-01 NOTE — Telephone Encounter (Signed)
Copied from [place] [id number]. Topic: Appointment Scheduling - Scheduling Inquiry for Clinic >> Jul 01, 2018  1:10 PM Scherrie Gerlach wrote: Reason for CRM: pt was to follow up for his cholesterol in 4-6 mo from 09/30/17 labs.  Pt wants to know if Dr Martinique wants him to do a virtual visit or just do a lipid panel? Pt not scheduled to come back until Sept, but remembers he should check his levels before then. Please advise  Pt states ok to leave message if he does not answer.

## 2018-07-02 NOTE — Telephone Encounter (Signed)
Patient scheduled appointment for 07/07/2018 for a follow-up. Nothing further needed at this time.

## 2018-07-06 ENCOUNTER — Encounter: Payer: Self-pay | Admitting: Family Medicine

## 2018-07-06 ENCOUNTER — Ambulatory Visit (INDEPENDENT_AMBULATORY_CARE_PROVIDER_SITE_OTHER): Payer: Medicare HMO | Admitting: Family Medicine

## 2018-07-06 ENCOUNTER — Other Ambulatory Visit: Payer: Self-pay

## 2018-07-06 DIAGNOSIS — M5416 Radiculopathy, lumbar region: Secondary | ICD-10-CM | POA: Diagnosis not present

## 2018-07-06 DIAGNOSIS — N529 Male erectile dysfunction, unspecified: Secondary | ICD-10-CM | POA: Diagnosis not present

## 2018-07-06 DIAGNOSIS — M6281 Muscle weakness (generalized): Secondary | ICD-10-CM | POA: Diagnosis not present

## 2018-07-06 DIAGNOSIS — K5900 Constipation, unspecified: Secondary | ICD-10-CM

## 2018-07-06 DIAGNOSIS — M5410 Radiculopathy, site unspecified: Secondary | ICD-10-CM

## 2018-07-06 DIAGNOSIS — E782 Mixed hyperlipidemia: Secondary | ICD-10-CM

## 2018-07-06 DIAGNOSIS — M2560 Stiffness of unspecified joint, not elsewhere classified: Secondary | ICD-10-CM | POA: Diagnosis not present

## 2018-07-06 DIAGNOSIS — I493 Ventricular premature depolarization: Secondary | ICD-10-CM | POA: Diagnosis not present

## 2018-07-06 DIAGNOSIS — M545 Low back pain: Secondary | ICD-10-CM | POA: Diagnosis not present
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
## 2018-07-06 NOTE — Assessment & Plan Note (Signed)
Still symptomatic. He is not interested in an epidural or other invasive treatment. Continue PT. Keep next appointment with orthopedics.

## 2018-07-06 NOTE — Assessment & Plan Note (Signed)
Asymptomatic. He is tolerating medication well. Continue following with Dr. Martinique, cardiologist.

## 2018-07-06 NOTE — Assessment & Plan Note (Signed)
He is tolerating pravastatin well, so no changes in current management. Continue low-fat diet. We will arrange lipid panel and further recommendation will be given accordingly.

## 2018-07-06 NOTE — Assessment & Plan Note (Signed)
We discussed possible etiologies including hormonal, medications, psychologic events among some. At this time I am not recommending further work-up nor pharmacologic treatment. Continue following with psychotherapist.

## 2018-07-07 ENCOUNTER — Ambulatory Visit: Payer: Medicare HMO

## 2018-07-07 ENCOUNTER — Other Ambulatory Visit (INDEPENDENT_AMBULATORY_CARE_PROVIDER_SITE_OTHER): Payer: Medicare HMO

## 2018-07-07 ENCOUNTER — Other Ambulatory Visit: Payer: Self-pay

## 2018-07-07 ENCOUNTER — Ambulatory Visit: Payer: Medicare HMO | Admitting: Family Medicine

## 2018-07-07 DIAGNOSIS — E782 Mixed hyperlipidemia: Secondary | ICD-10-CM | POA: Diagnosis not present

## 2018-07-07 DIAGNOSIS — G4733 Obstructive sleep apnea (adult) (pediatric): Secondary | ICD-10-CM | POA: Diagnosis not present

## 2018-07-07 LAB — HEPATIC FUNCTION PANEL
ALT: 25 U/L (ref 0–53)
AST: 24 U/L (ref 0–37)
Albumin: 4.5 g/dL (ref 3.5–5.2)
Alkaline Phosphatase: 71 U/L (ref 39–117)
Bilirubin, Direct: 0.1 mg/dL (ref 0.0–0.3)
Total Bilirubin: 0.5 mg/dL (ref 0.2–1.2)
Total Protein: 7.3 g/dL (ref 6.0–8.3)

## 2018-07-07 LAB — LIPID PANEL
Cholesterol: 124 mg/dL (ref 0–200)
HDL: 28.9 mg/dL — ABNORMAL LOW (ref >39.00)
NonHDL: 95.17
Total CHOL/HDL Ratio: 4
Triglycerides: 228 mg/dL — ABNORMAL HIGH (ref 0.0–149.0)
VLDL: 45.6 mg/dL — ABNORMAL HIGH (ref 0.0–40.0)

## 2018-07-07 LAB — LDL CHOLESTEROL, DIRECT: Direct LDL: 70 mg/dL

## 2018-07-08 ENCOUNTER — Other Ambulatory Visit: Payer: Medicare HMO

## 2018-07-08 DIAGNOSIS — G4733 Obstructive sleep apnea (adult) (pediatric): Secondary | ICD-10-CM

## 2018-07-10 ENCOUNTER — Telehealth: Payer: Self-pay | Admitting: *Deleted

## 2018-07-10 DIAGNOSIS — M5416 Radiculopathy, lumbar region: Secondary | ICD-10-CM | POA: Diagnosis not present

## 2018-07-10 DIAGNOSIS — M545 Low back pain: Secondary | ICD-10-CM | POA: Diagnosis not present

## 2018-07-10 DIAGNOSIS — M6281 Muscle weakness (generalized): Secondary | ICD-10-CM | POA: Diagnosis not present

## 2018-07-10 DIAGNOSIS — M2560 Stiffness of unspecified joint, not elsewhere classified: Secondary | ICD-10-CM | POA: Diagnosis not present

## 2018-07-10 NOTE — Telephone Encounter (Signed)
Calling patient today to discuss upcoming appointment.  We are currently trying to limit exposure to the virus that causes COVID-19 by seeing patients at home rather than in the office. We would like to schedule this appointment as a Virtual Appointment VIA Smartphone or Laptop. Unable to reach patient.  LVMTCB  

## 2018-07-14 ENCOUNTER — Telehealth: Payer: Self-pay | Admitting: Family Medicine

## 2018-07-14 NOTE — Telephone Encounter (Addendum)
Patient calling in regards to results from 07/07/18. Pt was contacted with results and recommendations on 07/14/18 per notes of Dr. Martinique on 5/15//20. Pt states he would like to continue to take Pravastatin 20 mg and work on low fat diet instead of increasing Pravastatin to 40 mg. Pt would also like a copy of results mailed to address in chart.  Unable to document in result note.

## 2018-07-15 ENCOUNTER — Telehealth: Payer: Self-pay | Admitting: Pulmonary Disease

## 2018-07-15 DIAGNOSIS — M2560 Stiffness of unspecified joint, not elsewhere classified: Secondary | ICD-10-CM | POA: Diagnosis not present

## 2018-07-15 DIAGNOSIS — M6281 Muscle weakness (generalized): Secondary | ICD-10-CM | POA: Diagnosis not present

## 2018-07-15 DIAGNOSIS — F2089 Other schizophrenia: Secondary | ICD-10-CM | POA: Diagnosis not present

## 2018-07-15 DIAGNOSIS — G4733 Obstructive sleep apnea (adult) (pediatric): Secondary | ICD-10-CM

## 2018-07-15 DIAGNOSIS — M5416 Radiculopathy, lumbar region: Secondary | ICD-10-CM | POA: Diagnosis not present

## 2018-07-15 DIAGNOSIS — M545 Low back pain: Secondary | ICD-10-CM | POA: Diagnosis not present

## 2018-07-15 NOTE — Telephone Encounter (Signed)
Results from home sleep study: AHI 7.7, SpO2 low at 84%. This indicates mild sleep apnea. His DME company needs results to get his CPAP started back. Thanks.

## 2018-07-15 NOTE — Telephone Encounter (Signed)
Pt calling office requesting to know results of HST. HST was performed 07/07/2018 and is able to be seen under procedures tab of pt's chart. Tonya, please advise on the results for pt. Thanks!

## 2018-07-15 NOTE — Telephone Encounter (Signed)
Noted about medication. FYI sent to Dr. Martinique. Results put in outgoing mailbox on 07/14/2018.  Nothing further needed at this time.

## 2018-07-15 NOTE — Telephone Encounter (Signed)
Attempted to call pt but unable to reach. Left message for pt to return call. 

## 2018-07-16 ENCOUNTER — Other Ambulatory Visit: Payer: Self-pay

## 2018-07-16 ENCOUNTER — Telehealth (INDEPENDENT_AMBULATORY_CARE_PROVIDER_SITE_OTHER): Payer: Medicare HMO | Admitting: Cardiology

## 2018-07-16 DIAGNOSIS — I493 Ventricular premature depolarization: Secondary | ICD-10-CM | POA: Diagnosis not present

## 2018-07-16 NOTE — Telephone Encounter (Signed)
Patient is returning phone call.  Patient phone number is [phone].

## 2018-07-16 NOTE — Telephone Encounter (Signed)
Spoke to pt.  Agreeable to telehealth visit.     Virtual Visit Pre-Appointment Phone Call  "(Name), I am calling you today to discuss your upcoming appointment. We are currently trying to limit exposure to the virus that causes COVID-19 by seeing patients at home rather than in the office."  1. "What is the BEST phone number to call the day of the visit?" - include this in appointment notes  2. "Do you have or have access to (through a family member/friend) a smartphone with video capability that we can use for your visit?" a. If yes - list this number in appt notes as "cell" (if different from BEST phone #) and list the appointment type as a VIDEO visit in appointment notes b. If no - list the appointment type as a PHONE visit in appointment notes  3. Confirm consent - "In the setting of the current Covid19 crisis, you are scheduled for a (phone or video) visit with your provider on (date) at (time).  Just as we do with many in-office visits, in order for you to participate in this visit, we must obtain consent.  If you'd like, I can send this to your mychart (if signed up) or email for you to review.  Otherwise, I can obtain your verbal consent now.  All virtual visits are billed to your insurance company just like a normal visit would be.  By agreeing to a virtual visit, we'd like you to understand that the technology does not allow for your provider to perform an examination, and thus may limit your provider's ability to fully assess your condition. If your provider identifies any concerns that need to be evaluated in person, we will make arrangements to do so.  Finally, though the technology is pretty good, we cannot assure that it will always work on either your or our end, and in the setting of a video visit, we may have to convert it to a phone-only visit.  In either situation, we cannot ensure that we have a secure connection.  Are you willing to proceed?" STAFF: Did the patient verbally  acknowledge consent to telehealth visit? Document YES/NO here: YES  4. Advise patient to be prepared - "Two hours prior to your appointment, go ahead and check your blood pressure, pulse, oxygen saturation, and your weight (if you have the equipment to check those) and write them all down. When your visit starts, your provider will ask you for this information. If you have an Apple Watch or Kardia device, please plan to have heart rate information ready on the day of your appointment. Please have a pen and paper handy nearby the day of the visit as well."  5. Give patient instructions for MyChart download to smartphone OR Doximity/Doxy.me as below if video visit (depending on what platform provider is using)  6. Inform patient they will receive a phone call 15 minutes prior to their appointment time (may be from unknown caller ID) so they should be prepared to answer    TELEPHONE CALL NOTE  Steven Vincent has been deemed a candidate for a follow-up tele-health visit to limit community exposure during the Covid-19 pandemic. I spoke with the patient via phone to ensure availability of phone/video source, confirm preferred email & phone number, and discuss instructions and expectations.  I reminded Steven Vincent to be prepared with any vital sign and/or heart rhythm information that could potentially be obtained via home monitoring, at the time of his visit. I reminded  Steven Vincent to expect a phone call prior to his visit.  Stanton Kidney, RN 07/16/2018 8:13 AM   INSTRUCTIONS FOR DOWNLOADING THE MYCHART APP TO SMARTPHONE  - The patient must first make sure to have activated MyChart and know their login information - If Apple, go to [employer] and type in MyChart in the search bar and download the app. If Android, ask patient to go to [employer] and type in [place] in the search bar and download the app. The app is free but as with any other app downloads, their phone may  require them to verify saved payment information or Apple/Android password.  - The patient will need to then log into the app with their MyChart username and password, and select  as their healthcare provider to link the account. When it is time for your visit, go to the MyChart app, find appointments, and click Begin Video Visit. Be sure to Select Allow for your device to access the Microphone and Camera for your visit. You will then be connected, and your provider will be with you shortly.  **If they have any issues connecting, or need assistance please contact MyChart service desk [phone])**  **If using a computer, in order to ensure the best quality for their visit they will need to use either of the following Internet Browsers: [employer], or Google Chrome**  IF USING DOXIMITY or DOXY.ME - The patient will receive a link just prior to their visit by text.     FULL LENGTH CONSENT FOR TELE-HEALTH VISIT   I hereby voluntarily request, consent and authorize [facility] and its employed or contracted physicians, physician assistants, nurse practitioners or other licensed health care professionals (the Practitioner), to provide me with telemedicine health care services (the "Services") as deemed necessary by the treating Practitioner. I acknowledge and consent to receive the Services by the Practitioner via telemedicine. I understand that the telemedicine visit will involve communicating with the Practitioner through live audiovisual communication technology and the disclosure of certain medical information by electronic transmission. I acknowledge that I have been given the opportunity to request an in-person assessment or other available alternative prior to the telemedicine visit and am voluntarily participating in the telemedicine visit.  I understand that I have the right to withhold or withdraw my consent to the use of telemedicine in the course of my care at  any time, without affecting my right to future care or treatment, and that the Practitioner or I may terminate the telemedicine visit at any time. I understand that I have the right to inspect all information obtained and/or recorded in the course of the telemedicine visit and may receive copies of available information for a reasonable fee.  I understand that some of the potential risks of receiving the Services via telemedicine include:  Marland Kitchen Delay or interruption in medical evaluation due to technological equipment failure or disruption; . Information transmitted may not be sufficient (e.g. poor resolution of images) to allow for appropriate medical decision making by the Practitioner; and/or  . In rare instances, security protocols could fail, causing a breach of personal health information.  Furthermore, I acknowledge that it is my responsibility to provide information about my medical history, conditions and care that is complete and accurate to the best of my ability. I acknowledge that Practitioner's advice, recommendations, and/or decision may be based on factors not within their control, such as incomplete or inaccurate data provided by me or distortions of  diagnostic images or specimens that may result from electronic transmissions. I understand that the practice of medicine is not an exact science and that Practitioner makes no warranties or guarantees regarding treatment outcomes. I acknowledge that I will receive a copy of this consent concurrently upon execution via email to the email address I last provided but may also request a printed copy by calling the office of [facility].    I understand that my insurance will be billed for this visit.   I have read or had this consent read to me. . I understand the contents of this consent, which adequately explains the benefits and risks of the Services being provided via telemedicine.  . I have been provided ample opportunity to ask questions  regarding this consent and the Services and have had my questions answered to my satisfaction. . I give my informed consent for the services to be provided through the use of telemedicine in my medical care  By participating in this telemedicine visit I agree to the above.

## 2018-07-16 NOTE — Telephone Encounter (Signed)
Pt is returning call. Phone number [phone]

## 2018-07-16 NOTE — Telephone Encounter (Signed)
We will start with the 10-15 cmH20

## 2018-07-16 NOTE — Telephone Encounter (Signed)
Order has been placed to [facility].  Will close this encounter.

## 2018-07-16 NOTE — Progress Notes (Signed)
Virtual Visit via Telephone Note   This visit type was conducted due to national recommendations for restrictions regarding the COVID-19 Pandemic (e.g. social distancing) in an effort to limit this patient's exposure and mitigate transmission in our community.  Due to his co-morbid illnesses, this patient is at least at moderate risk for complications without adequate follow up.  This format is felt to be most appropriate for this patient at this time.  The patient did not have access to video technology/had technical difficulties with video requiring transitioning to audio format only (telephone).  All issues noted in this document were discussed and addressed.  No physical exam could be performed with this format.  Please refer to the patient's chart for his  consent to telehealth for [facility].   Date:  07/16/2018   ID:  Steven Vincent, DOB 25-Aug-1965, MRN [id number]  Patient Location: Home Provider Location: Home  PCP:  Martinique, Betty G, MD  Cardiologist:  Minus Breeding, MD  Electrophysiologist:  None   Evaluation Performed:  Consultation - Steven Vincent was referred by Peter Martinique for the evaluation of PVCs.  Chief Complaint: PVCs  History of Present Illness:    Steven Vincent is a 53 y.o. male with PVCs and bradycardia.  He wore a Holter monitor October 2018 that showed a PVC burden of 16%.  His average heart rate was 66, his slowest was 40 while asleep.  His beta-blocker was reduced and has eventually been stopped.  Myoview has shown a small fixed apical defect, otherwise normal.  He had a repeat Holter December 2019 which showed a PVC burden of 26%.  He also has sleep apnea and is on CPAP.  He presented to cardiology clinic in [place] with couplets.  He has symptoms of dizziness and chest pressure.  He is also been sweating more and gets short of breath with activity.  His chest pressure usually occurs with activity.  His symptoms have been occurring for the last  2 years.  He was seen by cardiology in April and was put on Multitak.  He was thought to have multiple medication interactions with class Ic agents.  Since that time he has done better.  He has had much less fatigue and weakness.  He is unaware of his PVCs on his current medication.  The patient does not have symptoms concerning for COVID-19 infection (fever, chills, cough, or new shortness of breath).    Past Medical History:  Diagnosis Date  . Arthritis   . Baker's cyst   . Headache(784.0)   . Knee pain   . Low back pain   . Migraines   . OSA (obstructive sleep apnea) 04/10/2017  . Personality disorder ([place])   . Seizures ([place])    Past Surgical History:  Procedure Laterality Date  . TOOTH EXTRACTION  12/2014   lower left     Current Meds  Medication Sig  . diclofenac (FLECTOR) 1.3 % PTCH Place 1 patch onto the skin daily as needed (back and knee pain).  Marland Kitchen diclofenac sodium (VOLTAREN) 1 % GEL Apply topically as directed. (Patient taking differently: Apply 2 g topically daily as needed (pain). )  . dronedarone (MULTAQ) 400 MG tablet Take 1 tablet (400 mg total) by mouth 2 (two) times daily with a meal.  . fluticasone (FLONASE) 50 MCG/ACT nasal spray Place 1 spray into both nostrils daily.  . nortriptyline (PAMELOR) 75 MG capsule Take 1 capsule (75 mg total) by mouth at bedtime.  Marland Kitchen  pravastatin (PRAVACHOL) 20 MG tablet Take 1 tablet (20 mg total) by mouth daily.  . rizatriptan (MAXALT) 10 MG tablet TAKE 1 TABLET AT ONSET OF MIGRAINE. MAY REPEAT IN 2 HOURS IF NEEDED. MAX 2 PER DAY.  Marland Kitchen rOPINIRole (REQUIP) 0.25 MG tablet Take 1-2 tablets (0.25-0.5 mg total) by mouth at bedtime.  . topiramate (TOPAMAX) 50 MG tablet Take one tablet in the AM and two tablets in the PM. (Patient taking differently: Take 50-100 mg by mouth daily. Take 50 mg in the AM and 100 mg in the PM.)     Allergies:   Aspirin   Social History   Tobacco Use  . Smoking status: Former Research scientist (life sciences)  . Smokeless tobacco:  Never Used  . Tobacco comment: states he only tried as a teenager  Substance Use Topics  . Alcohol use: No  . Drug use: No     Family Hx: The patient's family history includes Alzheimer's disease in his father; Aneurysm in his mother; Heart disease in his father; Hyperlipidemia in his father; Hypertension in his brother and father; Other in his father; Stroke in his brother.  ROS:   Please see the history of present illness.     All other systems reviewed and are negative.   Prior CV studies:   The following studies were reviewed today:  Holter monitor 02/21/2018: Sinus rhythm, PVCs of multiple morphologies, PVC burden 26%  Labs/Other Tests and Data Reviewed:    EKG:  An ECG dated 05/05/18 was personally reviewed today and demonstrated:  SR, multifocal PVCs  Recent Labs: 05/04/2018: BUN 21; Creatinine, Ser 1.00; Hemoglobin 13.9; Platelets 161; Potassium 3.5; Sodium 139 07/07/2018: ALT 25   Recent Lipid Panel Lab Results  Component Value Date/Time   CHOL 124 07/07/2018 08:33 AM   TRIG 228.0 (H) 07/07/2018 08:33 AM   TRIG 231 (HH) 12/27/2005 10:05 AM   HDL 28.90 (L) 07/07/2018 08:33 AM   CHOLHDL 4 07/07/2018 08:33 AM   LDLCALC 74 10/16/2016 09:00 AM   LDLDIRECT 70.0 07/07/2018 08:33 AM    Wt Readings from Last 3 Encounters:  06/03/18 253 lb 6.4 oz (114.9 kg)  05/04/18 254 lb (115.2 kg)  03/16/18 254 lb (115.2 kg)     Objective:    Vital Signs:  BP 128/81   Pulse 75    VITAL SIGNS:  reviewed GEN:  no acute distress PSYCH:  normal affect  ASSESSMENT & PLAN:    1. PVCs: Having 26% PVCs on his most recent monitor.  He has since been put on Multitak.  He is unable to feel his PVCs, though he does state that since being on his Multitak he has felt much better with less fatigue.  At this point we will continue him on his current medications.  He is on quite a few psych medications which would interact with flecainide.  I will see him back in 3 months with an ECG at that  time.  If he continues to have a high PVC burden, he may require medication changes.  Unfortunately his PVCs are multifocal.  COVID-19 Education: The signs and symptoms of COVID-19 were discussed with the patient and how to seek care for testing (follow up with PCP or arrange E-visit).  The importance of social distancing was discussed today.  Time:   Today, I have spent 11 minutes with the patient with telehealth technology discussing the above problems.     Medication Adjustments/Labs and Tests Ordered: Current medicines are reviewed at length with the patient  today.  Concerns regarding medicines are outlined above.   Tests Ordered: No orders of the defined types were placed in this encounter.   Medication Changes: No orders of the defined types were placed in this encounter.   Case discussed with primary cardiology  Disposition:  Follow up in 3 month(s)  Signed, Will Meredith Leeds, MD  07/16/2018 9:11 AM    [place]

## 2018-07-16 NOTE — Telephone Encounter (Signed)
Spoke with patient. He is aware of results. He stated that he wants this order to go back to [facility]. Advised him I would place the order today.   Reviewed patient's chart, there are 2 different settings mentioned in the previous orders. One order for a CPAP has autocpap 10-15cm (dated 03/16/18). The other order states settings should be autocpap 5-15cm (dated 03/09/18).   Tonya, please advise on the settings. Thanks!

## 2018-07-22 ENCOUNTER — Ambulatory Visit: Payer: Medicare HMO | Admitting: Neurology

## 2018-07-23 DIAGNOSIS — G4733 Obstructive sleep apnea (adult) (pediatric): Secondary | ICD-10-CM | POA: Diagnosis not present

## 2018-07-24 DIAGNOSIS — M5416 Radiculopathy, lumbar region: Secondary | ICD-10-CM | POA: Diagnosis not present

## 2018-07-24 DIAGNOSIS — M545 Low back pain: Secondary | ICD-10-CM | POA: Diagnosis not present

## 2018-07-24 DIAGNOSIS — M6281 Muscle weakness (generalized): Secondary | ICD-10-CM | POA: Diagnosis not present

## 2018-07-24 DIAGNOSIS — M2560 Stiffness of unspecified joint, not elsewhere classified: Secondary | ICD-10-CM | POA: Diagnosis not present

## 2018-07-27 ENCOUNTER — Ambulatory Visit: Payer: Medicare HMO | Admitting: Neurology

## 2018-07-28 ENCOUNTER — Other Ambulatory Visit: Payer: Self-pay

## 2018-07-28 ENCOUNTER — Ambulatory Visit (INDEPENDENT_AMBULATORY_CARE_PROVIDER_SITE_OTHER): Payer: Medicare HMO | Admitting: Neurology

## 2018-07-28 ENCOUNTER — Encounter: Payer: Self-pay | Admitting: Neurology

## 2018-07-28 VITALS — BP 129/85 | HR 76 | Temp 96.8°F | Ht 74.0 in | Wt 260.0 lb

## 2018-07-28 DIAGNOSIS — G43009 Migraine without aura, not intractable, without status migrainosus: Secondary | ICD-10-CM

## 2018-07-28 DIAGNOSIS — R569 Unspecified convulsions: Secondary | ICD-10-CM

## 2018-07-28 MED ORDER — TOPIRAMATE 50 MG PO TABS: ORAL_TABLET | ORAL | 3 refills | Status: DC

## 2018-07-28 MED ORDER — ERENUMAB-AOOE 140 MG/ML ~~LOC~~ SOAJ: 140.0000 mg | SUBCUTANEOUS | 4 refills | Status: DC

## 2018-07-28 MED ORDER — NORTRIPTYLINE HCL 75 MG PO CAPS: 75.0000 mg | ORAL_CAPSULE | Freq: Every day | ORAL | 3 refills | Status: DC

## 2018-07-28 MED ORDER — SUMATRIPTAN SUCCINATE 6 MG/0.5ML ~~LOC~~ SOLN: 6.0000 mg | Freq: Two times a day (BID) | SUBCUTANEOUS | 3 refills | Status: DC | PRN

## 2018-07-28 NOTE — Patient Instructions (Signed)
We will use Imitrex shots instead of the maxalt.  We will start [place] for headache prevention.

## 2018-07-28 NOTE — Progress Notes (Signed)
Reason for visit: Seizures, migraine headache  Steven Vincent is an 53 y.o. male  History of present illness:  Steven. Vincent is a 53 year old right-handed white male with a history of migraine headache and seizures.  The patient has been on Topamax with good control of his seizures, his migraine headaches have unfortunately increased in frequency recently.  He has gone from having an average about 1 headache a week to about 3 a week.  He takes nortriptyline 75 mg at night.  He notes that weather changes have a big impact on his headache frequency.  He will take Maxalt if needed for the headache, but he oftentimes has to take 2 a day when the headache comes on.  In the past, he has gotten much better benefit from the injectable Imitrex.  He has sleep apnea, he recently was able to get back on his CPAP.  He returns this office for an evaluation.  He has not had any recurrent seizures.  Past Medical History:  Diagnosis Date  . Arthritis   . Baker's cyst   . Headache(784.0)   . Knee pain   . Low back pain   . Migraines   . OSA (obstructive sleep apnea) 04/10/2017  . Personality disorder ([place])   . Seizures ([place])     Past Surgical History:  Procedure Laterality Date  . TOOTH EXTRACTION  12/2014   lower left    Family History  Problem Relation Age of Onset  . Heart disease Father        defibrilator  . Hyperlipidemia Father   . Hypertension Father   . Alzheimer's disease Father   . Other Father        stents in legs  . Aneurysm Mother        brain  . Hypertension Brother   . Stroke Brother        X2    Social history:  reports that he has quit smoking. He has never used smokeless tobacco. He reports that he does not drink alcohol or use drugs.    Allergies  Allergen Reactions  . Aspirin     GI upset Other reaction(s): Vomiting    Medications:  Prior to Admission medications   Medication Sig Start Date End Date Taking? Authorizing Provider  diclofenac (FLECTOR)  1.3 % PTCH Place 1 patch onto the skin daily as needed (back and knee pain).   Yes [provider]  dronedarone (MULTAQ) 400 MG tablet Take 1 tablet (400 mg total) by mouth 2 (two) times daily with a meal. 06/09/18  Yes Martinique, Peter M, MD  fluticasone [facility]) 50 MCG/ACT nasal spray Place 1 spray into both nostrils daily. 03/09/18  Yes Lauraine Rinne, NP  nortriptyline (PAMELOR) 75 MG capsule Take 1 capsule (75 mg total) by mouth at bedtime. 05/25/18  Yes Kathrynn Ducking, MD  pravastatin (PRAVACHOL) 20 MG tablet Take 1 tablet (20 mg total) by mouth daily. 06/09/18  Yes Martinique, Betty G, MD  rizatriptan (MAXALT) 10 MG tablet TAKE 1 TABLET AT ONSET OF MIGRAINE. MAY REPEAT IN 2 HOURS IF NEEDED. MAX 2 PER DAY. 06/30/18  Yes Kathrynn Ducking, MD  rOPINIRole (REQUIP) 0.25 MG tablet Take 1-2 tablets (0.25-0.5 mg total) by mouth at bedtime. 05/25/18  Yes Martinique, Betty G, MD  topiramate (TOPAMAX) 50 MG tablet Take one tablet in the AM and two tablets in the PM. Patient taking differently: Take 50-100 mg by mouth daily. Take 50 mg in the  AM and 100 mg in the PM. 06/25/17  Yes Ward Givens, NP    ROS:  Out of a complete 14 system review of symptoms, the patient complains only of the following symptoms, and all other reviewed systems are negative.  Headache Hip pain  Blood pressure 129/85, pulse 76, temperature (!) 96.8 F (36 C), temperature source Oral, height 6\' 2"  (1.88 m), weight 260 lb (117.9 kg).  Physical Exam  General: The patient is alert and cooperative at the time of the examination.  The patient is moderately to markedly obese.  Skin: No significant peripheral edema is noted.   Neurologic Exam  Mental status: The patient is alert and oriented x 3 at the time of the examination. The patient has apparent normal recent and remote memory, with an apparently normal attention span and concentration ability.   Cranial nerves: Facial symmetry is present. Speech is normal, no aphasia  or dysarthria is noted. Extraocular movements are full. Visual fields are full.  Motor: The patient has good strength in all 4 extremities.  Sensory examination: Soft touch sensation is symmetric on the face, arms, and legs.  Coordination: The patient has good finger-nose-finger and heel-to-shin bilaterally.  Gait and station: The patient has a normal gait. Tandem gait is normal. Romberg is negative. No drift is seen.  Reflexes: Deep tendon reflexes are symmetric.   Assessment/Plan:  1.  Migraine headache, intractable  2.  History of seizures, well controlled  The patient has had worsening of his headache control.  We will initiate Aimovig for headache prevention, if this is effective, we may be able get him off of the nortriptyline.  The patient will be given injectable Imitrex to take if needed, he will stop the Maxalt.  He will follow-up here in 6 months, sooner if needed.  He will continue the Maxalt and the nortriptyline for now, prescriptions were sent in.  Jill Alexanders MD 07/28/2018 9:40 AM  [facility] [address] [place], [place] [address]  Phone [phone] Fax [phone]

## 2018-08-05 ENCOUNTER — Telehealth: Payer: Self-pay | Admitting: Neurology

## 2018-08-05 DIAGNOSIS — F819 Developmental disorder of scholastic skills, unspecified: Secondary | ICD-10-CM | POA: Diagnosis not present

## 2018-08-05 DIAGNOSIS — F2089 Other schizophrenia: Secondary | ICD-10-CM | POA: Diagnosis not present

## 2018-08-05 MED ORDER — NORTRIPTYLINE HCL 75 MG PO CAPS: 75.0000 mg | ORAL_CAPSULE | Freq: Every day | ORAL | 3 refills | Status: DC

## 2018-08-05 MED ORDER — ERENUMAB-AOOE 140 MG/ML ~~LOC~~ SOAJ: 140.0000 mg | SUBCUTANEOUS | 1 refills | Status: DC

## 2018-08-05 NOTE — Addendum Note (Signed)
Addended by: Verlin Grills T on: 08/05/2018 03:58 PM   Modules accepted: Orders

## 2018-08-05 NOTE — Telephone Encounter (Signed)
Refill for rx of aimovig and nortriptyline has been submitted to [facility].

## 2018-08-05 NOTE — Telephone Encounter (Signed)
Pt has called to inform that the medications prescribed by Dr Jannifer Franklin were called into a [facility].  Pt states all the meds prescribed by Dr Jannifer Franklin need to be called into [facility]

## 2018-08-07 DIAGNOSIS — H6192 Disorder of left external ear, unspecified: Secondary | ICD-10-CM | POA: Diagnosis not present

## 2018-08-07 DIAGNOSIS — M47816 Spondylosis without myelopathy or radiculopathy, lumbar region: Secondary | ICD-10-CM | POA: Diagnosis not present

## 2018-08-07 DIAGNOSIS — Z79899 Other long term (current) drug therapy: Secondary | ICD-10-CM | POA: Diagnosis not present

## 2018-08-10 ENCOUNTER — Telehealth: Payer: Self-pay

## 2018-08-10 NOTE — Telephone Encounter (Signed)
PA for aimovig has been submitted via cover my meds and received instant approval. PA Case: [id number],  Coverage Starts on: 08/10/2018- 11/08/2018  KEY: [id number]

## 2018-08-17 ENCOUNTER — Ambulatory Visit (INDEPENDENT_AMBULATORY_CARE_PROVIDER_SITE_OTHER): Payer: Medicare HMO | Admitting: Family Medicine

## 2018-08-17 ENCOUNTER — Other Ambulatory Visit: Payer: Self-pay

## 2018-08-17 ENCOUNTER — Encounter: Payer: Self-pay | Admitting: Family Medicine

## 2018-08-17 VITALS — BP 124/80 | HR 77 | Temp 98.2°F | Resp 12 | Wt 260.0 lb

## 2018-08-17 DIAGNOSIS — E6609 Other obesity due to excess calories: Secondary | ICD-10-CM | POA: Diagnosis not present

## 2018-08-17 DIAGNOSIS — Z6830 Body mass index (BMI) 30.0-30.9, adult: Secondary | ICD-10-CM

## 2018-08-17 DIAGNOSIS — E782 Mixed hyperlipidemia: Secondary | ICD-10-CM

## 2018-08-17 DIAGNOSIS — L298 Other pruritus: Secondary | ICD-10-CM | POA: Diagnosis not present

## 2018-08-17 MED ORDER — TRIAMCINOLONE ACETONIDE 0.1 % EX CREA: 1.0000 "application " | TOPICAL_CREAM | Freq: Two times a day (BID) | CUTANEOUS | 0 refills | Status: DC

## 2018-08-17 NOTE — Patient Instructions (Signed)
A few things to remember from today's visit:   Pruritic erythematous rash  Apply cream,small amount for 14 days. Please schedule you CPE and we will follow then.  Please be sure medication list is accurate. If a new problem present, please set up appointment sooner than planned today.

## 2018-08-17 NOTE — Progress Notes (Signed)
HPI:   Steven Vincent is a 53 y.o. male, who is here today c/o pruritic skin lesions noted about 2 weeks ago,anteror to left ear. No tenderness or easy bleeding. Whitish and "flakes up" intermittently.He has not identified exacerbating or alleviating factors. No new med,facial product or soap,outdoors exposure ,or insect bite.  Denies having similar lesions before. Negative for fever,chills,earache,sore throat,oral lesions, cough,dyspnea,or wheezing. He has not tried OTC treatments.  Also concerned about HLD,recommend increasing dose of Pravastatin from 20 mg to 40 mg,he is still taking 20 mg. Prefers to continue same treatment. Trying to do better with low fat diet.   Lab Results  Component Value Date   CHOL 124 07/07/2018   HDL 28.90 (L) 07/07/2018   LDLCALC 74 10/16/2016   LDLDIRECT 70.0 07/07/2018   TRIG 228.0 (H) 07/07/2018   CHOLHDL 4 07/07/2018  He does not exercises regularly due to chronic back pain. Trying to improved diet. Roommate cooks,eating more at home.   Review of Systems  Constitutional: Negative for activity change, appetite change, chills, fatigue and unexpected weight change.  Gastrointestinal: Negative for abdominal pain, nausea and vomiting.  Musculoskeletal: Positive for back pain.  Neurological: Negative for syncope and headaches.  Hematological: Negative for adenopathy. Does not bruise/bleed easily.  Rest see pertinent positives and negatives per HPI.   Current Outpatient Medications on File Prior to Visit  Medication Sig Dispense Refill  . diclofenac (FLECTOR) 1.3 % PTCH Place 1 patch onto the skin daily as needed (back and knee pain).    Marland Kitchen dronedarone (MULTAQ) 400 MG tablet Take 1 tablet (400 mg total) by mouth 2 (two) times daily with a meal. 60 tablet 11  . Erenumab-aooe (AIMOVIG) 140 MG/ML SOAJ Inject 140 mg into the skin every 30 (thirty) days. 3 pen 1  . fluticasone (FLONASE) 50 MCG/ACT nasal spray Place 1 spray into both  nostrils daily. 16 g 3  . nortriptyline (PAMELOR) 75 MG capsule Take 1 capsule (75 mg total) by mouth at bedtime. 90 capsule 3  . pravastatin (PRAVACHOL) 20 MG tablet Take 1 tablet (20 mg total) by mouth daily. 90 tablet 3  . rOPINIRole (REQUIP) 0.25 MG tablet Take 1-2 tablets (0.25-0.5 mg total) by mouth at bedtime. 90 tablet 1  . SUMAtriptan (IMITREX) 6 MG/0.5ML SOLN injection Inject 0.5 mLs (6 mg total) into the skin 2 (two) times daily as needed for migraine or headache. May repeat in 2 hours if headache persists or recurs. 6 vial 3  . topiramate (TOPAMAX) 50 MG tablet Take one tablet in the AM and two tablets in the PM. 270 tablet 3   No current facility-administered medications on file prior to visit.      Past Medical History:  Diagnosis Date  . Arthritis   . Baker's cyst   . Headache(784.0)   . Knee pain   . Low back pain   . Migraines   . OSA (obstructive sleep apnea) 04/10/2017  . Personality disorder ([place])   . Seizures ([facility])    Allergies  Allergen Reactions  . Aspirin     GI upset Other reaction(s): Vomiting    Social History   Socioeconomic History  . Marital status: Divorced    Spouse name: Not on file  . Number of children: 0  . Years of education: Not on file  . Highest education level: Not on file  Occupational History  . Occupation: disabled  Social Needs  . Financial resource strain: Hard  . Food  insecurity    Worry: Never true    Inability: Never true  . Transportation needs    Medical: No    Non-medical: No  Tobacco Use  . Smoking status: Former Research scientist (life sciences)  . Smokeless tobacco: Never Used  . Tobacco comment: states he only tried as a teenager  Substance and Sexual Activity  . Alcohol use: No  . Drug use: No  . Sexual activity: Not Currently  Lifestyle  . Physical activity    Days per week: 0 days    Minutes per session: 0 min  . Stress: Very much  Relationships  . Social Herbalist on phone: Once a week    Gets together: Never     Attends religious service: Never    Active member of club or organization: No    Attends meetings of clubs or organizations: Never    Relationship status: Divorced  Other Topics Concern  . Not on file  Social History Narrative   03/16/18: Lives alone with dog, Glenard Haring in apartment. Wife of 16 years left him about 2 years ago, and pt. has had difficulty adjusting.   Says his brother Francee Piccolo, who lives in [place], is his main contact, source of support, as well as his dog.    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 States he uses painting as additional therapy.     Vitals:   08/17/18 0815  BP: 124/80  Pulse: 77  Resp: 12  Temp: 98.2 F (36.8 C)  SpO2: 93%   Body mass index is 33.38 kg/m.  Wt Readings from Last 3 Encounters:  08/17/18 260 lb (117.9 kg)  07/28/18 260 lb (117.9 kg)  06/03/18 253 lb 6.4 oz (114.9 kg)     Physical Exam  Nursing note and vitals reviewed. Constitutional: He is oriented to person, place, and time. He appears well-developed. No distress.  HENT:  Head: Normocephalic and atraumatic.  Mouth/Throat: Oropharynx is clear and moist and mucous membranes are normal.  Eyes: Pupils are equal, round, and reactive to light. Conjunctivae are normal.  Cardiovascular: Normal rate and regular rhythm.  No murmur heard. Respiratory: Effort normal and breath sounds normal. No respiratory distress.  Musculoskeletal:        General: No edema.  Lymphadenopathy:    He has no cervical adenopathy.  Neurological: He is alert and oriented to person, place, and time. He has normal strength.  Skin: Skin is warm. Rash noted. No erythema.  3 mm scaly,mildly erythematous lesion anterior to tragus left ear. Small scratching marks.No surrounding edema. Regular borders.  Psychiatric: His mood appears anxious.  Well groomed, good eye contact.    ASSESSMENT AND PLAN:  Steven Vincent was seen  today for rash.  Diagnoses and all orders for this visit:  Pruritic erythematous rash Lesion is not suspicious for a serious process. ? Seborrheic dermatitis. Topical steroid recommended,small amount bid x 14 days. Instructed about warning signs. If not resolved in a few weeks or gets worse,derma referral will be recommended.  -     triamcinolone cream (KENALOG) 0.1 %; Apply 1 application topically 2 (two) times daily.  Mixed hyperlipidemia Continue Pravastatin 20 mg daily. Adverse effects of HLD discussed. We will plan on re-checking during his next CPE.  Class 1 obesity due to excess calories with serious comorbidity and body mass index (BMI) of 30.0 to 30.9 in adult We discussed benefits of wt loss as well as  adverse effects of obesity. Consistency with healthy diet and physical activity recommended. Low impact exercise as tolerated, water exercises ideally.   25 min face to face OV. > 50% was dedicated to discussion of skin lesions differential Dx's and prognosis. CV risk factors,we went through dietary recommendations.  Return in about 5 months (around 01/17/2019) for cpe.     Dominque Levandowski G. Martinique, MD  [facility]. [place] office.

## 2018-08-22 ENCOUNTER — Encounter: Payer: Self-pay | Admitting: Family Medicine

## 2018-08-23 DIAGNOSIS — G4733 Obstructive sleep apnea (adult) (pediatric): Secondary | ICD-10-CM | POA: Diagnosis not present

## 2018-08-24 ENCOUNTER — Telehealth: Payer: Self-pay | Admitting: *Deleted

## 2018-08-24 NOTE — Telephone Encounter (Signed)
A message was left, re: foll

## 2018-09-04 DIAGNOSIS — Z03818 Encounter for observation for suspected exposure to other biological agents ruled out: Secondary | ICD-10-CM | POA: Diagnosis not present

## 2018-09-10 NOTE — Progress Notes (Signed)
Reviewed and agree with assessment/plan.   Malone Vanblarcom, MD [facility] 02/21/2016, 12:24 PM Pager:  [phone]  

## 2018-09-15 ENCOUNTER — Ambulatory Visit: Payer: Medicare HMO | Admitting: Cardiology

## 2018-09-21 ENCOUNTER — Ambulatory Visit (INDEPENDENT_AMBULATORY_CARE_PROVIDER_SITE_OTHER): Payer: Medicare HMO | Admitting: Family Medicine

## 2018-09-21 ENCOUNTER — Other Ambulatory Visit: Payer: Self-pay

## 2018-09-21 ENCOUNTER — Encounter: Payer: Self-pay | Admitting: Family Medicine

## 2018-09-21 VITALS — BP 122/84 | HR 100 | Temp 97.9°F | Resp 12 | Ht 74.0 in | Wt 261.2 lb

## 2018-09-21 DIAGNOSIS — G4700 Insomnia, unspecified: Secondary | ICD-10-CM

## 2018-09-21 DIAGNOSIS — R5382 Chronic fatigue, unspecified: Secondary | ICD-10-CM

## 2018-09-21 DIAGNOSIS — G2581 Restless legs syndrome: Secondary | ICD-10-CM

## 2018-09-21 DIAGNOSIS — E782 Mixed hyperlipidemia: Secondary | ICD-10-CM | POA: Diagnosis not present

## 2018-09-21 DIAGNOSIS — L298 Other pruritus: Secondary | ICD-10-CM

## 2018-09-21 DIAGNOSIS — M5410 Radiculopathy, site unspecified: Secondary | ICD-10-CM

## 2018-09-21 DIAGNOSIS — R631 Polydipsia: Secondary | ICD-10-CM | POA: Diagnosis not present

## 2018-09-21 DIAGNOSIS — R5383 Other fatigue: Secondary | ICD-10-CM | POA: Insufficient documentation

## 2018-09-21 LAB — POCT GLYCOSYLATED HEMOGLOBIN (HGB A1C): Hemoglobin A1C: 4.9 % (ref 4.0–5.6)

## 2018-09-21 MED ORDER — PRAVASTATIN SODIUM 20 MG PO TABS: 20.0000 mg | ORAL_TABLET | Freq: Every day | ORAL | 3 refills | Status: DC

## 2018-09-21 MED ORDER — ROPINIROLE HCL 0.25 MG PO TABS: 0.2500 mg | ORAL_TABLET | Freq: Every day | ORAL | 1 refills | Status: DC

## 2018-09-21 NOTE — Assessment & Plan Note (Signed)
Problem is aggravated by chronic pain. Good sleep hygiene recommended. Continue nortriptyline 75 mg daily at bedtime. Continue following with psychiatrist.

## 2018-09-21 NOTE — Assessment & Plan Note (Signed)
Pain is not well controlled. Continue Flector 1.3% patch and nortriptyline 75 mg at bedtime. Instructed about warning signs. Continue following with pain management.

## 2018-09-21 NOTE — Assessment & Plan Note (Signed)
Mildly improved with CPAP. We discussed possible etiologies, including psychiatric disorders, medication, and OSA. Encouraged to continue wearing CPAP machine at night. Insomnia can certainly been contributing to problem. Good sleep hygiene recommended.

## 2018-09-21 NOTE — Assessment & Plan Note (Signed)
Problem is well controlled with Requip 0.25-0.5 mg at bedtime, no changes in current management.

## 2018-09-21 NOTE — Assessment & Plan Note (Signed)
Continue pravastatin 20 mg daily. We will plan on checking lipid panel in 02/2018.

## 2018-09-21 NOTE — Progress Notes (Signed)
ACUTE VISIT   HPI:  Chief Complaint  Patient presents with   Follow-up    pt states ears aren't getting better still flaking and having a clear film over them   Medication Refill    all meds/ [facility]    [name] is a 53 y.o. male, who is here today complaining of persistent scaly pruritic rash on external ears. He has other complaints, not new. He was seen last on 08/17/2018, when he was complaining about pruritic rash on external ears, topical steroid was recommended, he did not feel like it helped. He denies local edema or pain. + Pruritic scalp. He has not identified exacerbating or alleviating factors.  Negative for earache, ear pruritus or discharge. Hearing loss, is stable, he has hearing aids.  He also mentions "spots" on forearms, he has problem for a while and is slowly getting worse.  He is also complaining about lower back pain, states that he cannot take "pain medication" because she is a "high risk patient." Bilateral knee pain, exacerbated by prolonged standing and walking. + Stiffness in the morning. Pain alleviated by rest.  Also complaining of fatigue, mild improvement after start any wearing CPAP. This is a chronic problem. Difficulty staying asleep, worse due to back pain. He sleeps on his couch, able to sleep for 4 to 6 hours.  If he sleeps on his bed he does not sleep more than 3 hours. His schizotypal personality disorder, he follows with psychiatrist and psychotherapist. Is living alone. He complains about debt he was left with after his divorce.So thinking about moving to a different place, which will be cheaper. He denies depressed mood or suicidal thoughts.   He needs refills on some of his medications. Hyperlipidemia, he is taking pravastatin 20 mg daily.  Lab Results  Component Value Date   CHOL 124 07/07/2018   HDL 28.90 (L) 07/07/2018   LDLCALC 74 10/16/2016   LDLDIRECT 70.0 07/07/2018   TRIG 228.0 (H) 07/07/2018   CHOLHDL 4 07/07/2018   He is also showing me neuro visit summery , states that people at work are concerned about his Hx of seizures. He has not have an event since early 2000. He follows with a neurologist. For migraines he is on Topamax.  RLS, currently he is on Requip 0.25 to 0.5 mg at bedtime. He denies any lower extremity discomfort while in bed/couch or with extremity elevation while sitting. Negative for lower extremity edema or erythema.  He is also concerned about "sugar problems." He is complaining about being thirsty "all the time." He has not identified exacerbating or alleviating factors. No history of DM 2. He denies polydipsia or polyphagia.  Review of Systems  Constitutional: Negative for activity change, chills and fever.  HENT: Negative for mouth sores and sore throat.   Eyes: Negative for pain and redness.  Respiratory: Negative for cough, shortness of breath and wheezing.   Gastrointestinal: Negative for abdominal pain, nausea and vomiting.  Genitourinary: Negative for decreased urine volume, dysuria and hematuria.  Allergic/Immunologic: Positive for environmental allergies.  Neurological: Negative for seizures, syncope and facial asymmetry.  Psychiatric/Behavioral: Negative for hallucinations.  Rest see pertinent positives and negatives per HPI.   Current Outpatient Medications on File Prior to Visit  Medication Sig Dispense Refill   diclofenac (FLECTOR) 1.3 % PTCH Place 1 patch onto the skin daily as needed (back and knee pain).     dronedarone (MULTAQ) 400 MG tablet Take 1 tablet (400 mg total)  by mouth 2 (two) times daily with a meal. 60 tablet 11   Erenumab-aooe (AIMOVIG) 140 MG/ML SOAJ Inject 140 mg into the skin every 30 (thirty) days. 3 pen 1   nortriptyline (PAMELOR) 75 MG capsule Take 1 capsule (75 mg total) by mouth at bedtime. 90 capsule 3   SUMAtriptan (IMITREX) 6 MG/0.5ML SOLN injection Inject 0.5 mLs (6 mg total) into the skin 2 (two) times  daily as needed for migraine or headache. May repeat in 2 hours if headache persists or recurs. 6 vial 3   topiramate (TOPAMAX) 50 MG tablet Take one tablet in the AM and two tablets in the PM. 270 tablet 3   triamcinolone cream (KENALOG) 0.1 % Apply 1 application topically 2 (two) times daily. 30 g 0   fluticasone (FLONASE) 50 MCG/ACT nasal spray Place 1 spray into both nostrils daily. (Patient not taking: Reported on 09/21/2018) 16 g 3   No current facility-administered medications on file prior to visit.      Past Medical History:  Diagnosis Date   Arthritis    Baker's cyst    Headache(784.0)    Knee pain    Low back pain    Migraines    OSA (obstructive sleep apnea) 04/10/2017   Personality disorder ([facility])    Seizures ([facility])    Allergies  Allergen Reactions   Aspirin     GI upset Other reaction(s): Vomiting    Social History   Socioeconomic History   Marital status: Divorced    Spouse name: Not on file   Number of children: 0   Years of education: Not on file   Highest education level: Not on file  Occupational History   Occupation: disabled  Social Designer, fashion/clothing strain: Hard   Food insecurity    Worry: Never true    Inability: Never true   Transportation needs    Medical: No    Non-medical: No  Tobacco Use   Smoking status: Former Smoker   Smokeless tobacco: Never Used   Tobacco comment: states he only tried as a teenager  Substance and Sexual Activity   Alcohol use: No   Drug use: No   Sexual activity: Not Currently  Lifestyle   Physical activity    Days per week: 0 days    Minutes per session: 0 min   Stress: Very much  Relationships   Social connections    Talks on phone: Once a week    Gets together: Never    Attends religious service: Never    Active member of club or organization: No    Attends meetings of clubs or organizations: Never    Relationship status: Divorced  Other Topics Concern   Not on  file  Social History Narrative   03/16/18: 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 Wife of 16 years left him about 2 years ago, and pt. has had difficulty adjusting.   Says his brother Francee Piccolo, who lives in [place], is his main contact, source of support, as well as his dog.    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 States he uses painting as additional therapy.     Vitals:   09/21/18 1505  BP: 122/84  Pulse: 100  Resp: 12  Temp: 97.9 F (36.6 C)  SpO2: 98%   Body mass index is 33.54 kg/m.   Physical Exam  Nursing note and vitals reviewed. Constitutional: He is  oriented to person, place, and time. He appears well-developed. No distress.  HENT:  Head: Normocephalic and atraumatic.  Right Ear: Tympanic membrane and ear canal normal. Decreased hearing is noted.  Left Ear: Tympanic membrane and ear canal normal. Decreased hearing is noted.  Ears:  Mouth/Throat: Oropharynx is clear and moist and mucous membranes are normal.  Missing teeth. Hearing aids, bilateral.  Eyes: Conjunctivae are normal.  Cardiovascular: Normal rate and regular rhythm.  No murmur heard. Pulses:      Dorsalis pedis pulses are 2+ on the right side and 2+ on the left side.  Respiratory: Effort normal and breath sounds normal. No respiratory distress.  GI: Soft. He exhibits no mass. There is no abdominal tenderness.  Musculoskeletal:        General: No edema.  Lymphadenopathy:    He has no cervical adenopathy.  Neurological: He is alert and oriented to person, place, and time. He has normal strength.  Antalgic gait,it is not assisted.  Skin: Skin is warm. Rash noted. No erythema.  Mildly erythematous rash on helix (external ear), no edema. Right side with mild fine scales. I do not appreciate lesions on scalp.  See HENT graphic.  Psychiatric: His mood appears anxious. His affect is labile.  Fairly groomed,  good eye contact.    ASSESSMENT AND PLAN:  Mr. Chrisean was seen today for follow-up and medication refill.  Diagnoses and all orders for this visit:  Lab Results  Component Value Date   HGBA1C 4.9 09/21/2018    Polydipsia We discussed possible etiologies. Adverse effects on electrolytes discussed. Na++ has been in normal range. A1c done here in the office is in normal range.  -     POCT HgB A1C  Pruritic erythematous rash History and physical examination do not suggest a serious process. ?  Seborrheic dermatitis. I did not appreciate lesions on his scalp. He is very concerned, so dermatology referral placed.  -     Ambulatory referral to Dermatology   Radicular pain of left lower extremity Pain is not well controlled. Continue Flector 1.3% patch and nortriptyline 75 mg at bedtime. Instructed about warning signs. Continue following with pain management.  Hyperlipidemia Continue pravastatin 20 mg daily. We will plan on checking lipid panel in 02/2018.  Fatigue Mildly improved with CPAP. We discussed possible etiologies, including psychiatric disorders, medication, and OSA. Encouraged to continue wearing CPAP machine at night. Insomnia can certainly been contributing to problem. Good sleep hygiene recommended.  RLS (restless legs syndrome) Problem is well controlled with Requip 0.25-0.5 mg at bedtime, no changes in current management.  Insomnia Problem is aggravated by chronic pain. Good sleep hygiene recommended. Continue nortriptyline 75 mg daily at bedtime. Continue following with psychiatrist.    Return in about 6 months (around 03/24/2019) for F/U and AWV.   -Mr.Raynor Calcaterra was advised to seek immediate medical attention if sudden worsening symptoms or to follow if they persist or if new concerns arise.     Anahlia Iseminger G. Martinique, MD  [facility]. [place] office.

## 2018-09-21 NOTE — Patient Instructions (Signed)
A few things to remember from today's visit:   Polydipsia  Insomnia, unspecified type  Pruritic erythematous rash - Plan: Ambulatory referral to Dermatology  Radicular pain of left lower extremity  No changes on any other medications today. I will see you back in January 2021 for your Medicare visit and follow-up.  Please be sure medication list is accurate. If a new problem present, please set up appointment sooner than planned today.

## 2018-09-22 ENCOUNTER — Telehealth: Payer: Self-pay | Admitting: Adult Health

## 2018-09-22 ENCOUNTER — Encounter: Payer: Self-pay | Admitting: Adult Health

## 2018-09-22 ENCOUNTER — Ambulatory Visit (INDEPENDENT_AMBULATORY_CARE_PROVIDER_SITE_OTHER): Payer: Medicare HMO | Admitting: Adult Health

## 2018-09-22 VITALS — BP 146/87 | HR 70 | Temp 97.5°F | Ht 74.0 in | Wt 262.0 lb

## 2018-09-22 DIAGNOSIS — E782 Mixed hyperlipidemia: Secondary | ICD-10-CM | POA: Diagnosis not present

## 2018-09-22 DIAGNOSIS — I493 Ventricular premature depolarization: Secondary | ICD-10-CM | POA: Diagnosis not present

## 2018-09-22 DIAGNOSIS — G4733 Obstructive sleep apnea (adult) (pediatric): Secondary | ICD-10-CM | POA: Diagnosis not present

## 2018-09-22 MED ORDER — PRAVASTATIN SODIUM 20 MG PO TABS: 20.0000 mg | ORAL_TABLET | Freq: Every day | ORAL | 3 refills | Status: DC

## 2018-09-22 MED ORDER — DRONEDARONE HCL 400 MG PO TABS: 400.0000 mg | ORAL_TABLET | Freq: Two times a day (BID) | ORAL | 3 refills | Status: DC

## 2018-09-22 NOTE — Progress Notes (Signed)
Cardiology Office Note   Date:  09/22/2018   ID:  Steven Vincent, DOB [birth year], MRN [id number]  PCP:  Martinique, Betty G, MD  Cardiologist:  Dr. Percival Spanish   History of Present Illness: Steven Vincent is a 53 y.o. male who presents for ongoing assessment and management of complaints of PVCs and bradycardia.  Holter monitor in 2018 revealed PVC burden of 60% with an average heart rate of 66 low-dose 40 bpm while sleeping.  His beta-blocker was weaned off and eventually discontinued.  Repeat Holter in 2019 December showed PVC burden of 26%.  Patient also has a history of OSA on CPAP.    He was eventually placed on Multitak for control of PVCs and had significant improvement in his symptoms.  It was noted that he is on multiple psychotropic medications that would interact with flecainide and therefore this was not recommended.  The patient is here for follow-up EKG and to assess his current status on medication regimen.  He comes to the office today quite talkative with lengthy and extensive description of his prior history.  He moves from one subject to another without prompting.  He denies any chest pain, palpitations, dizziness, but he continues to have significant fatigue.  He states that he is wearing his CPAP but he is having trouble with the machine.  He states that it continues to cut off.  He is followed by Dr. [name] pulmonologist.  Past Medical History:  Diagnosis Date  . Arthritis   . Baker's cyst   . Headache(784.0)   . Knee pain   . Low back pain   . Migraines   . OSA (obstructive sleep apnea) 04/10/2017  . Personality disorder ([place])   . Seizures ([place])     Past Surgical History:  Procedure Laterality Date  . TOOTH EXTRACTION  12/2014   lower left     Current Outpatient Medications  Medication Sig Dispense Refill  . diclofenac (FLECTOR) 1.3 % PTCH Place 1 patch onto the skin daily as needed (back and knee pain).    Marland Kitchen dronedarone (MULTAQ) 400 MG tablet Take 1 tablet  (400 mg total) by mouth 2 (two) times daily with a meal. 60 tablet 11  . Erenumab-aooe (AIMOVIG) 140 MG/ML SOAJ Inject 140 mg into the skin every 30 (thirty) days. 3 pen 1  . fluticasone (FLONASE) 50 MCG/ACT nasal spray Place 1 spray into both nostrils daily. 16 g 3  . nortriptyline (PAMELOR) 75 MG capsule Take 1 capsule (75 mg total) by mouth at bedtime. 90 capsule 3  . pravastatin (PRAVACHOL) 20 MG tablet Take 1 tablet (20 mg total) by mouth daily. 90 tablet 3  . rOPINIRole (REQUIP) 0.25 MG tablet Take 1-2 tablets (0.25-0.5 mg total) by mouth at bedtime. 90 tablet 1  . SUMAtriptan (IMITREX) 6 MG/0.5ML SOLN injection Inject 0.5 mLs (6 mg total) into the skin 2 (two) times daily as needed for migraine or headache. May repeat in 2 hours if headache persists or recurs. 6 vial 3  . topiramate (TOPAMAX) 50 MG tablet Take one tablet in the AM and two tablets in the PM. 270 tablet 3  . triamcinolone cream (KENALOG) 0.1 % Apply 1 application topically 2 (two) times daily. 30 g 0   No current facility-administered medications for this visit.     Allergies:   Aspirin    Social History:  The patient  reports that he has quit smoking. He has never used smokeless tobacco. He reports that he does not  drink alcohol or use drugs.   Family History:  The patient's family history includes Alzheimer's disease in his father; Aneurysm in his mother; Heart disease in his father; Hyperlipidemia in his father; Hypertension in his brother and father; Other in his father; Stroke in his brother.    ROS: All other systems are reviewed and negative. Unless otherwise mentioned in H&P    PHYSICAL EXAM: VS:  BP (!) 146/87   Pulse 70   Temp (!) 97.5 F (36.4 C)   Ht 6\' 2"  (1.88 m)   Wt 262 lb (118.8 kg)   SpO2 98%   BMI 33.64 kg/m  , BMI Body mass index is 33.64 kg/m. GEN: Well nourished, well developed, in no acute distress HEENT: normal Neck: no JVD, carotid bruits, or masses Cardiac: RRR; no murmurs, rubs,  or gallops,no edema  Respiratory:  Clear to auscultation bilaterally, normal work of breathing GI: soft, nontender, nondistended, + BS MS: no deformity or atrophy Skin: warm and dry, no rash Neuro:  Strength and sensation are intact Psych: euthymic mood, full affect   EKG: Normal sinus rhythm ventricular rate of 70 bpm.  Recent Labs: 05/04/2018: BUN 21; Creatinine, Ser 1.00; Hemoglobin 13.9; Platelets 161; Potassium 3.5; Sodium 139 07/07/2018: ALT 25    Lipid Panel    Component Value Date/Time   CHOL 124 07/07/2018 0833   TRIG 228.0 (H) 07/07/2018 0833   TRIG 231 (HH) 12/27/2005 1005   HDL 28.90 (L) 07/07/2018 0833   CHOLHDL 4 07/07/2018 0833   VLDL 45.6 (H) 07/07/2018 0833   LDLCALC 74 10/16/2016 0900   LDLDIRECT 70.0 07/07/2018 0833      Wt Readings from Last 3 Encounters:  09/22/18 262 lb (118.8 kg)  09/21/18 261 lb 3.2 oz (118.5 kg)  08/17/18 260 lb (117.9 kg)      Other studies Reviewed: Holter Monitor: 02/16/2018 NSR Frequent ventricular ectopy with isolated PVCs, bigeminy, trigeminy, ventricular couplets and triplets.  One runs of NSVT 4 beats.    Echocardiogram 01/09/2017 Left ventricle: The cavity size was normal. Wall thickness was   increased in a pattern of moderate LVH. Systolic function was   normal. The estimated ejection fraction was in the range of 55%   to 60%. Wall motion was normal; there were no regional wall   motion abnormalities. Left ventricular diastolic function   parameters were normal. - Mitral valve: There was trivial regurgitation.  NM Stress Test 05/28/2017  Nuclear stress EF: 56%. No wall motion abnormalities  There was no ST segment deviation noted during stress.  Defect 1: There is a small defect of mild severity present in the apex location. No ischemia identified.  This is a low risk study. Apical defect likely secondary to tissue attenuation artifact.     ASSESSMENT AND PLAN:  1.  Frequent PVCs: Now on Multitaq 400 mg  twice daily.  He denies any recurrence of palpitations.  EKG reveals normal sinus rhythm.  The patient will continue on Multitak as directed.  No changes in current regimen.  He is to follow-up with [facility] previously scheduled office visit on October 20, 2018.  No further testing anticipated at this time.  Refills provided for Multitaq.  2.  OSA: Continues on CPAP.  However his machine has been malfunctioning and he states he only gets 3 hours of sleep a night.  I have advised him to follow-up with his pulmonologist for need to have machine exchanged or adjusted.  Cardiology will not be managing his CPAP as  he already has a provider who is doing so.  3.  Hypercholesterolemia: Continues on pravastatin 20 mg daily.  Refills provided.  Labs are drawn by his primary care physician.  4.  Schizophrenic personality disorder: Followed by psychiatry.  Patient does have flight of thoughts today.  Defer to psychiatry for ongoing management.  Current medicines are reviewed at length with the patient today.    Labs/ tests ordered today include: None  Phill Myron. [place] Pugh, ANP, AACC   09/22/2018 [facility] [place] [address] Office [phone] Fax [phone]

## 2018-09-22 NOTE — Patient Instructions (Signed)

## 2018-09-22 NOTE — Telephone Encounter (Signed)
LVM, asking pt to call back and be pre-screened for COVID-19.

## 2018-09-23 ENCOUNTER — Telehealth: Payer: Self-pay | Admitting: Pulmonary Disease

## 2018-09-23 NOTE — Telephone Encounter (Signed)
Called and spoke to pt. Pt states he got a call from [facility] stating he is not meeting his 70% compliance with his CPAP usage. Pt unsure what he can do to help with the report as he still wants to keep the machine. Advised pt we will call [facility] tomorrow (09/24/2018) as they are closed right now and inquire if there is something that can be done to allow pt to keep his machine.   Will need to find out when the pt started therapy and what the 90 day window of compliance was, if above 70% pt should be able to keep CPAP. Will need to contact pt after speaking with [facility].

## 2018-09-23 NOTE — Telephone Encounter (Signed)
Spoke with pt.. Pt would like to have a compliance report to show his DME as they are telling him he is noncompliant. I have printed this report and placed up front for the pt. Nothing further was needed.

## 2018-09-24 NOTE — Telephone Encounter (Signed)
Spoke with the pt and notified of recs per Cherina below  Nothing further needed

## 2018-09-24 NOTE — Telephone Encounter (Signed)
PT  CALLING TO CHECK THE STATUS OF MACHINE I ADVISED PT THAT WE WERE UNABLE TO DME ON YESTERDAY AND THAT WHEN THEY WERE REACHED THIS [place] THAT WE WOULD GIVE HIM A CALL.Hillery Hunter

## 2018-09-24 NOTE — Telephone Encounter (Signed)
Reviewed pt's info in [place]. It appears the patient received his cpap on 07/23/2018. When I clicked on the "initial compliance period" report, I received a message stating "patient has not yet met compliance". It seems [facility] is using the 90 day compliance window for him. Therefore he has until 10/23/2018. But based on the information in [facility] now, he has used his machine everyday since getting the machine on 07/23/2018.   Left a message for [facility] to call us back and explain exactly what is going on and why they are threatening to take his machine away so soon.

## 2018-09-24 NOTE — Telephone Encounter (Signed)
Pt is calling back [phone]

## 2018-09-24 NOTE — Telephone Encounter (Signed)
Left message for patient to call back  

## 2018-09-24 NOTE — Telephone Encounter (Signed)
Patient is returning phone call.  Patient phone number is [phone].

## 2018-09-24 NOTE — Telephone Encounter (Signed)
Christina from [employer] called back. She stated that the patient has been calling their office everyday as well. She stated that as of today, [facility] has not placed a pickup ticket on his account. Since he has [employer], Mcarthur Rossetti has authorized him to keep the machine for the entire duration of the authorization.   She stated that the issue is that he is not using his machine for more than 4 hours a night. Their compliance dept in [place] calls patients to offer support in to encourage patients to do so. 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   Left message for patient to call back to review this information with him.

## 2018-09-25 ENCOUNTER — Telehealth: Payer: Self-pay | Admitting: Neurology

## 2018-09-25 NOTE — Telephone Encounter (Signed)
Pt called stating that all of his medications have to be sent to [facility] not the [facility]. He also states that the nortriptyline (PAMELOR) 75 MG capsule is not helping him sleep and he would like to know if the dosage can be increased or what can be done. Pt states he would like to be called after 2pm today when he gets off work or would like to be called early in the morning Monday. Please advise.

## 2018-09-28 MED ORDER — NORTRIPTYLINE HCL 50 MG PO CAPS: 100.0000 mg | ORAL_CAPSULE | Freq: Every day | ORAL | 3 refills | Status: DC

## 2018-09-28 MED ORDER — TOPIRAMATE 50 MG PO TABS: ORAL_TABLET | ORAL | 3 refills | Status: DC

## 2018-09-28 MED ORDER — SUMATRIPTAN SUCCINATE 6 MG/0.5ML ~~LOC~~ SOLN: 6.0000 mg | Freq: Two times a day (BID) | SUBCUTANEOUS | 1 refills | Status: DC | PRN

## 2018-09-28 NOTE — Telephone Encounter (Signed)
Pt called wanting to know that update on this. Pt states you can call him at home until 2pm.

## 2018-09-28 NOTE — Telephone Encounter (Signed)
Rx has been sent. Waiting to see what MD will recommend in regards to the nortriptyline.

## 2018-09-28 NOTE — Addendum Note (Signed)
Addended by: Kathrynn Ducking on: 09/28/2018 04:55 PM   Modules accepted: Orders

## 2018-09-28 NOTE — Telephone Encounter (Signed)
The patient was called, the last visit revealed that the pulse was in the 70s, that the patient could tolerate a higher dose of the nortriptyline, we will increase him to the 100 mg at night.

## 2018-10-05 DIAGNOSIS — M25552 Pain in left hip: Secondary | ICD-10-CM | POA: Diagnosis not present

## 2018-10-05 DIAGNOSIS — M170 Bilateral primary osteoarthritis of knee: Secondary | ICD-10-CM | POA: Diagnosis not present

## 2018-10-06 DIAGNOSIS — F2089 Other schizophrenia: Secondary | ICD-10-CM | POA: Diagnosis not present

## 2018-10-07 ENCOUNTER — Telehealth: Payer: Self-pay | Admitting: Pulmonary Disease

## 2018-10-07 DIAGNOSIS — Z760 Encounter for issue of repeat prescription: Secondary | ICD-10-CM | POA: Diagnosis not present

## 2018-10-07 DIAGNOSIS — G894 Chronic pain syndrome: Secondary | ICD-10-CM | POA: Diagnosis not present

## 2018-10-07 DIAGNOSIS — M47816 Spondylosis without myelopathy or radiculopathy, lumbar region: Secondary | ICD-10-CM | POA: Diagnosis not present

## 2018-10-07 NOTE — Telephone Encounter (Signed)
lmtcb for pt.  

## 2018-10-08 ENCOUNTER — Telehealth: Payer: Self-pay | Admitting: *Deleted

## 2018-10-08 NOTE — Telephone Encounter (Signed)
Spoke with pt. He states that he is needing certain type of bed due to his back and lung problems. After further conversation with the pt, he is needing a bed that has an adjustable base so that he can adjust the head and foot of his bed. Pt does not want a hospital bed. I contacted [employer] at the number that was provided. I was on a long hold with no one coming to the line. Will try back.

## 2018-10-08 NOTE — Telephone Encounter (Signed)
Pt returning call and can be reached @ [phone].Steven Vincent

## 2018-10-08 NOTE — Telephone Encounter (Signed)
Copied from [place] [phone]. Topic: General - Inquiry >> Oct 08, 2018  8:16 AM Virl Axe D wrote: Reason for CRM: Pt stated that [facility] will cover the cost of a new bed for him as he is sleeping in a Lazy Boy due to his back issues. They need for Dr. Martinique to call their Clinical Intake line. [phone]. Pt would like to know if Dr. Martinique needs an appt with him first. Please advise. JJ#[phone]

## 2018-10-08 NOTE — Telephone Encounter (Signed)
Pt is returning call. Cb is [phone].

## 2018-10-08 NOTE — Telephone Encounter (Signed)
Pt is returning call. Cb is [phone] Pt requesting a VM be left on this number.

## 2018-10-08 NOTE — Telephone Encounter (Signed)
Attempted to call pt but unable to reach. Left message for pt to return call. 

## 2018-10-09 NOTE — Telephone Encounter (Signed)
I do not think his insurance will cover a "bed" if there is not a clear indication.  He is the one who needs to call his insurance and find out if a new bed is covered. Usually a form is sent to PCP to be completed and to be link to a diagnosis.  If this is related to his chronic back pain, he is following with pain management and orthopedist, they may be able to help him with this.  Thanks, BJ

## 2018-10-09 NOTE — Telephone Encounter (Signed)
Message sent to Dr. Jordan for review. Please advise 

## 2018-10-09 NOTE — Telephone Encounter (Signed)
Pt is returning call.  

## 2018-10-09 NOTE — Telephone Encounter (Signed)
Called and spoke with pt letting him know that due to VS seeing him for OSA, he could not get a bed that he needed with that dx. Stated that he needed to contact PCP to get them to order the bed for back pain and he verbalized understanding. Nothing further needed.

## 2018-10-09 NOTE — Telephone Encounter (Signed)
ATC pt, no answer. Left message for pt to call back.  

## 2018-10-09 NOTE — Telephone Encounter (Signed)
I see him for sleep apnea.  He wouldn't be able to justify a bed like this with diagnosis of sleep apnea.  He needs to contact his PCP to have order signed for diagnosis of back pain.

## 2018-10-09 NOTE — Telephone Encounter (Signed)
Spoke with A rep from [facility]. She states the pt has to call the DME company and they have to request authorization and we can sign the order. I called pt to let him know that he would have to pick the DME company and contact them them to start the process. In the meanwhile, VS would you be ok signing the order? Please advise.   ATC pt, no answer. Left message for pt to call back.    Ref [id number]

## 2018-10-13 NOTE — Telephone Encounter (Signed)
Left message for patient to return call to the office.

## 2018-10-14 ENCOUNTER — Other Ambulatory Visit: Payer: Self-pay

## 2018-10-14 ENCOUNTER — Encounter: Payer: Self-pay | Admitting: Pulmonary Disease

## 2018-10-14 ENCOUNTER — Ambulatory Visit (INDEPENDENT_AMBULATORY_CARE_PROVIDER_SITE_OTHER): Payer: Medicare HMO | Admitting: Pulmonary Disease

## 2018-10-14 VITALS — BP 110/82 | HR 78 | Temp 98.1°F | Ht 74.0 in | Wt 260.4 lb

## 2018-10-14 DIAGNOSIS — G4700 Insomnia, unspecified: Secondary | ICD-10-CM | POA: Diagnosis not present

## 2018-10-14 DIAGNOSIS — G4733 Obstructive sleep apnea (adult) (pediatric): Secondary | ICD-10-CM | POA: Diagnosis not present

## 2018-10-14 NOTE — Progress Notes (Signed)
Established Patient Office Visit  Subjective:  Patient ID: Steven Vincent, male    DOB: 01-17-1966  Age: 53 y.o. MRN: [id number]  CC:  Chief Complaint  Patient presents with  . Follow-up    Reports his cpap is working fine he just has a hard time keeping it on for 4 hours or more.    HPI Steven Vincent presents for evaluation of his chronic OSA. He has a PMH notable for HTN, bilateral radicular pain 2/2 to osteroarthritis, knee pain, and obstructive sleep apnea most notably.  Patient states that he is tolerating the machine well, denies facemask issues, denies choking sensation with sleep, denies machine intolerance.  Patient states that he is no longer having nearly the severity of headaches daily, blood pressures greatly improved, is not experiencing unexpected weight gain as compared to diet, nor sniffling daytime confusion. The patient's primary concern today is that given his severe radicular back pain he is unable to lie flat in the bed and therefore must transfer into the lazy boy lounge chair to sleep.  This results in continuous movement throughout the night with restless legs, and ultimately resulted in decreased tolerance of his CPAP device.  He states that he uses his CPAP nightly but struggles to use it consistently greater than 4 hours each night due to the irritability and sleeping situation. He must regularly rise from bed and walk due to pain.  He is requesting assistance with obtaining authorization for insurance coverage of a hospital bed in order to better permit adequate sleep and thus improve CPAP compliance thereby decreasing sequela of poorly treated OSA.  Past Medical History:  Diagnosis Date  . Arthritis   . Baker's cyst   . Headache(784.0)   . Knee pain   . Low back pain   . Migraines   . OSA (obstructive sleep apnea) 04/10/2017  . Personality disorder ([place])   . Seizures ([place])     Past Surgical History:  Procedure Laterality Date  . TOOTH EXTRACTION   12/2014   lower left    Family History  Problem Relation Age of Onset  . Heart disease Father        defibrilator  . Hyperlipidemia Father   . Hypertension Father   . Alzheimer's disease Father   . Other Father        stents in legs  . Aneurysm Mother        brain  . Hypertension Brother   . Stroke Brother        X2    Social History   Socioeconomic History  . Marital status: Divorced    Spouse name: Not on file  . Number of children: 0  . Years of education: Not on file  . Highest education level: Not on file  Occupational History  . Occupation: disabled  Social Needs  . Financial resource strain: Hard  . Food insecurity    Worry: Never true    Inability: Never true  . Transportation needs    Medical: No    Non-medical: No  Tobacco Use  . Smoking status: Former Research scientist (life sciences)  . Smokeless tobacco: Never Used  . Tobacco comment: states he only tried as a teenager  Substance and Sexual Activity  . Alcohol use: No  . Drug use: No  . Sexual activity: Not Currently  Lifestyle  . Physical activity    Days per week: 0 days    Minutes per session: 0 min  . Stress: Very much  Relationships  . Social Herbalist on phone: Once a week    Gets together: Never    Attends religious service: Never    Active member of club or organization: No    Attends meetings of clubs or organizations: Never    Relationship status: Divorced  . Intimate partner violence    Fear of current or ex partner: No    Emotionally abused: No    Physically abused: No    Forced sexual activity: No  Other Topics Concern  . Not on file  Social History Narrative   03/16/18: Lives alone with dog, Glenard Haring in apartment. Wife of 16 years left him about 2 years ago, and pt. has had difficulty adjusting.   Says his brother Francee Piccolo, who lives in [place], is his main contact, source of support, as well as his dog.    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 States he uses painting as additional therapy.     Outpatient Medications Prior to Visit  Medication Sig Dispense Refill  . diclofenac (FLECTOR) 1.3 % PTCH Place 1 patch onto the skin daily as needed (back and knee pain).    Marland Kitchen dronedarone (MULTAQ) 400 MG tablet Take 1 tablet (400 mg total) by mouth 2 (two) times daily with a meal. 180 tablet 3  . Erenumab-aooe (AIMOVIG) 140 MG/ML SOAJ Inject 140 mg into the skin every 30 (thirty) days. 3 pen 1  . fluticasone (FLONASE) 50 MCG/ACT nasal spray Place 1 spray into both nostrils daily. 16 g 3  . nortriptyline (PAMELOR) 50 MG capsule Take 2 capsules (100 mg total) by mouth at bedtime. 60 capsule 3  . pravastatin (PRAVACHOL) 20 MG tablet Take 1 tablet (20 mg total) by mouth daily. 90 tablet 3  . rOPINIRole (REQUIP) 0.25 MG tablet Take 1-2 tablets (0.25-0.5 mg total) by mouth at bedtime. 90 tablet 1  . SUMAtriptan (IMITREX) 6 MG/0.5ML SOLN injection Inject 0.5 mLs (6 mg total) into the skin 2 (two) times daily as needed for migraine or headache. May repeat in 2 hours if headache persists or recurs. 12 mL 1  . topiramate (TOPAMAX) 50 MG tablet Take one tablet in the AM and two tablets in the PM. 270 tablet 3  . triamcinolone cream (KENALOG) 0.1 % Apply 1 application topically 2 (two) times daily. 30 g 0   No facility-administered medications prior to visit.     Allergies  Allergen Reactions  . Aspirin     GI upset Other reaction(s): Vomiting    ROS Review of Systems  Constitutional: Negative for activity change, appetite change and unexpected weight change.  HENT: Negative for congestion, sinus pressure, sinus pain and sore throat.   Eyes: Negative for pain and discharge.  Respiratory: Positive for apnea. Negative for cough, chest tightness and shortness of breath.   Cardiovascular: Negative for chest pain and leg swelling.  Gastrointestinal: Negative for abdominal distention and abdominal pain.   Endocrine: Negative for polyuria.  Genitourinary: Negative for dysuria and urgency.  Musculoskeletal: Positive for arthralgias, back pain and gait problem.  Skin: Negative for rash and wound.  Neurological: Positive for headaches. Negative for weakness and light-headedness.  Psychiatric/Behavioral: Negative for agitation and confusion.     Objective:    Physical Exam  Constitutional: He is oriented to person, place, and time. He appears well-developed and well-nourished. No distress.  HENT:  Mouth/Throat: No oropharyngeal exudate.  Eyes: Pupils  are equal, round, and reactive to light. EOM are normal. No scleral icterus.  Neck: No JVD present. No tracheal deviation present. No thyromegaly present.  Cardiovascular: Normal rate and regular rhythm.  No murmur heard. Pulmonary/Chest: Effort normal and breath sounds normal. No respiratory distress. He has no wheezes.  Abdominal: Soft. Bowel sounds are normal. He exhibits no distension. There is no abdominal tenderness.  Musculoskeletal:        General: No tenderness, deformity or edema.  Neurological: He is alert and oriented to person, place, and time. No cranial nerve deficit.  Skin: Skin is warm and dry. No rash noted. He is not diaphoretic.    BP 110/82   Pulse 78   Temp 98.1 F (36.7 C) (Temporal)   Ht 6\' 2"  (1.88 m)   Wt 260 lb 6.4 oz (118.1 kg)   SpO2 97%   BMI 33.43 kg/m  Wt Readings from Last 3 Encounters:  10/14/18 260 lb 6.4 oz (118.1 kg)  09/22/18 262 lb (118.8 kg)  09/21/18 261 lb 3.2 oz (118.5 kg)     Health Maintenance Due  Topic Date Due  . HIV Screening  08/06/1980  . INFLUENZA VACCINE  09/26/2018    There are no preventive care reminders to display for this patient.  Lab Results  Component Value Date   TSH 2.51 03/17/2017   Lab Results  Component Value Date   WBC 6.6 05/04/2018   HGB 13.9 05/04/2018   HCT 42.2 05/04/2018   MCV 95.0 05/04/2018   PLT 161 05/04/2018   Lab Results  Component  Value Date   NA 139 05/04/2018   K 3.5 05/04/2018   CO2 22 05/04/2018   GLUCOSE 98 05/04/2018   BUN 21 (H) 05/04/2018   CREATININE 1.00 05/04/2018   BILITOT 0.5 07/07/2018   ALKPHOS 71 07/07/2018   AST 24 07/07/2018   ALT 25 07/07/2018   PROT 7.3 07/07/2018   ALBUMIN 4.5 07/07/2018   CALCIUM 8.7 (L) 05/04/2018   ANIONGAP 7 05/04/2018   GFR 86.33 09/30/2017   Lab Results  Component Value Date   CHOL 124 07/07/2018   Lab Results  Component Value Date   HDL 28.90 (L) 07/07/2018   Lab Results  Component Value Date   LDLCALC 74 10/16/2016   Lab Results  Component Value Date   TRIG 228.0 (H) 07/07/2018   Lab Results  Component Value Date   CHOLHDL 4 07/07/2018   Lab Results  Component Value Date   HGBA1C 4.9 09/21/2018      Assessment & Plan:   Problem List Items Addressed This Visit      Respiratory   OSA (obstructive sleep apnea) - Primary    Patient endorses good adherence to CPAP device.  He denied issues with the mask or machine today.  He denies daytime somnolence, headache, or fatigue.  Although I agree that reducing his pain by improving sleeping position would likely result in better CPAP compliance and therefore better overall health he currently meets satisfactory insurance guidelines with average usage nightly and number of nights used.  I do feel that if he would benefit from increased average use time nightly however this can be obtained.  Continue current CPAP without setting adjustments.  CPAP: Usage 30/30 days >=4 hrs 80% <4hrs 20% Average usage 5 hours and 39 minutes nightly Minimum pressure 10 Maximum pressure 15 EPR level 3 There is minimum leak with  AHI events 2.5 per hour        Other  Insomnia    The patient's insomnia has greatly improved with addition of the CPAP and adherence to the CPAP device.  However, he appears to awaken routinely throughout the night due to primarily to back pain.  We have advised that he pursue further  treatment with his orthopedic specialist and his PCP regarding his back pain.  Advised continue close adherence to his CPAP regimen.         No orders of the defined types were placed in this encounter.   Follow-up: Return in about 1 year (around 10/14/2019).    Kathi Ludwig, MD

## 2018-10-14 NOTE — Telephone Encounter (Signed)
Left message to return call to office.

## 2018-10-14 NOTE — Assessment & Plan Note (Signed)
Patient endorses good adherence to CPAP device.  He denied issues with the mask or machine today.  He denies daytime somnolence, headache, or fatigue.  Although I agree that reducing his pain by improving sleeping position would likely result in better CPAP compliance and therefore better overall health he currently meets satisfactory insurance guidelines with average usage nightly and number of nights used.  I do feel that if he would benefit from increased average use time nightly however this can be obtained.  Continue current CPAP without setting adjustments.  CPAP: Usage 30/30 days >=4 hrs 80% <4hrs 20% Average usage 5 hours and 39 minutes nightly Minimum pressure 10 Maximum pressure 15 EPR level 3 There is minimum leak with  AHI events 2.5 per hour

## 2018-10-14 NOTE — Patient Instructions (Signed)
Follow up in 1 year.

## 2018-10-14 NOTE — Assessment & Plan Note (Signed)
The patient's insomnia has greatly improved with addition of the CPAP and adherence to the CPAP device.  However, he appears to awaken routinely throughout the night due to primarily to back pain.  We have advised that he pursue further treatment with his orthopedic specialist and his PCP regarding his back pain.  Advised continue close adherence to his CPAP regimen.

## 2018-10-14 NOTE — Progress Notes (Signed)
Steven Vincent, Critical Care, and Sleep Medicine  Chief Complaint  Patient presents with  . Follow-up    Reports his cpap is working fine he just has a hard time keeping it on for 4 hours or more.     Constitutional:  BP 110/82   Pulse 78   Temp 98.1 F (36.7 C) (Temporal)   Ht 6\' 2"  (1.88 m)   Wt 260 lb 6.4 oz (118.1 kg)   SpO2 97%   BMI 33.43 kg/m   Past Medical History:  HA, Low back pain, Migraine HA, Seizures, Personality disorder  Brief Summary:  Steven Vincent is a 53 y.o. male with obstructive sleep apnea.  Doing well with CPAP.  Mask fit is okay.  Not having sinus congestion, sore throat, or dry mouth.  Uses CPAP nightly.  Main issues is difficulty with sleep due to back pain.  As a result he has frequent awakenings and has trouble finding comfort position in bed.   Physical Exam:   Appearance - well kempt   ENMT - no sinus tenderness, no nasal discharge, no oral exudate, poor dentition  Neck - no masses, trachea midline, no thyromegaly, no elevation in JVP  Respiratory - normal appearance of chest wall, normal respiratory effort w/o accessory muscle use, no dullness on percussion, no wheezing or rales  CV - s1s2 regular rate and rhythm, no murmurs, no peripheral edema, radial pulses symmetric  GI - soft, non tender  Lymph - no adenopathy noted in neck and axillary areas  MSK - normal gait  Ext - no cyanosis, clubbing, or joint inflammation noted  Skin - no rashes, lesions, or ulcers  Neuro - normal strength, oriented x 3  Psych - normal mood and affect   Assessment/Plan:   Obstructive sleep apnea. - he reports benefit from CPAP and compliance with therapy - continue auto CPAP  Insomnia. - gets nortriptyline from his neurologist  Back pain. - this is significantly impacting his sleep and ability to optimally use CPAP - he might benefit from adjustable bed to help with back pain - advised him to f/u with PCP and orthopedics     Patient Instructions  Follow up in 1 year    [name], MD [place] Pager: [phone] 10/14/2018, 11:36 AM  Flow Sheet    Sleep tests:  PSG 02/15/17 >>AHI 6.9, SpO2 low 85%. Supine AHI 10.5, REM AHI 24.3. Auto CPAP 09/13/18 to 10/12/18 >> used on 30 of 30 nights with average 5 hrs 39 min.  Average AHI 2.5 with median CPAP 12 and 95 th percentile CPAP 14 cm H2O  Cardiac tests:  Echo 01/09/17 >> EF 55 to 60%  Medications:   Allergies as of 10/14/2018      Reactions   Aspirin    GI upset Other reaction(s): Vomiting      Medication List       Accurate as of October 14, 2018 11:36 AM. If you have any questions, ask your nurse or doctor.        diclofenac 1.3 % Ptch Commonly known as: FLECTOR Place 1 patch onto the skin daily as needed (back and knee pain).   dronedarone 400 MG tablet Commonly known as: MULTAQ Take 1 tablet (400 mg total) by mouth 2 (two) times daily with a meal.   Erenumab-aooe 140 MG/ML Soaj Commonly known as: Aimovig Inject 140 mg into the skin every 30 (thirty) days.   fluticasone 50 MCG/ACT nasal spray Commonly known as: [place]  1 spray into both nostrils daily.   nortriptyline 50 MG capsule Commonly known as: Pamelor Take 2 capsules (100 mg total) by mouth at bedtime.   pravastatin 20 MG tablet Commonly known as: PRAVACHOL Take 1 tablet (20 mg total) by mouth daily.   rOPINIRole 0.25 MG tablet Commonly known as: REQUIP Take 1-2 tablets (0.25-0.5 mg total) by mouth at bedtime.   SUMAtriptan 6 MG/0.5ML Soln injection Commonly known as: Imitrex Inject 0.5 mLs (6 mg total) into the skin 2 (two) times daily as needed for migraine or headache. May repeat in 2 hours if headache persists or recurs.   topiramate 50 MG tablet Commonly known as: TOPAMAX Take one tablet in the AM and two tablets in the PM.   triamcinolone cream 0.1 % Commonly known as: KENALOG Apply 1 application topically 2 (two) times  daily.       Past Surgical History:  He  has a past surgical history that includes Tooth extraction (12/2014).  Family History:  His family history includes Alzheimer's disease in his father; Aneurysm in his mother; Heart disease in his father; Hyperlipidemia in his father; Hypertension in his brother and father; Other in his father; Stroke in his brother.  Social History:  He  reports that he has quit smoking. He has never used smokeless tobacco. He reports that he does not drink alcohol or use drugs.

## 2018-10-16 NOTE — Telephone Encounter (Signed)
Tried calling patient to discuss concerns. LVM for patient to return call to office. Patient has appointment scheduled on 10/21/2018 to discuss with PCP.

## 2018-10-16 NOTE — Telephone Encounter (Signed)
Called and stated that he would like a call back from the nurse regarding bed and cpap machine. Pt states that he has time to talk on the phone today. Pt states that he is sleeping in a recliner and he is in a lot of pain. Please advise   Cb# [phone]

## 2018-10-19 ENCOUNTER — Other Ambulatory Visit: Payer: Self-pay | Admitting: *Deleted

## 2018-10-19 MED ORDER — NORTRIPTYLINE HCL 50 MG PO CAPS: 100.0000 mg | ORAL_CAPSULE | Freq: Every day | ORAL | 1 refills | Status: DC

## 2018-10-20 ENCOUNTER — Other Ambulatory Visit: Payer: Self-pay

## 2018-10-20 ENCOUNTER — Encounter: Payer: Self-pay | Admitting: Cardiology

## 2018-10-20 ENCOUNTER — Ambulatory Visit (INDEPENDENT_AMBULATORY_CARE_PROVIDER_SITE_OTHER): Payer: Medicare HMO | Admitting: Cardiology

## 2018-10-20 ENCOUNTER — Telehealth: Payer: Self-pay | Admitting: *Deleted

## 2018-10-20 VITALS — BP 128/86 | HR 66 | Ht 74.0 in | Wt 261.0 lb

## 2018-10-20 DIAGNOSIS — I493 Ventricular premature depolarization: Secondary | ICD-10-CM | POA: Diagnosis not present

## 2018-10-20 NOTE — Progress Notes (Signed)
Electrophysiology Office Note   Date:  10/20/2018   ID:  Steven Vincent, Steven Vincent [birth year], MRN [id number]  PCP:  [place], Betty G, MD  Cardiologist:  [place] Primary Electrophysiologist:  Constance Haw, MD    No chief complaint on file.    History of Present Illness: Steven Vincent is a 53 y.o. male who is being seen today for the evaluation of PVCs at the request of [place], Malka So, MD. Presenting today for electrophysiology evaluation.  He has a history of PVCs, OSA.  He is currently on CPAP.  He had a Myoview that showed a fixed defect apically but was otherwise normal.  He has symptoms of dizziness and chest pressure as well as sweating.  Unfortunately was thought that he was going to have multiple medication interactions with class Ic agents.  Today, he denies symptoms of palpitations, chest pain, shortness of breath, orthopnea, PND, lower extremity edema, claudication, dizziness, presyncope, syncope, bleeding, or neurologic sequela. The patient is tolerating medications without difficulties.    Past Medical History:  Diagnosis Date  . Arthritis   . Baker's cyst   . Headache(784.0)   . Knee pain   . Low back pain   . Migraines   . OSA (obstructive sleep apnea) 04/10/2017  . Personality disorder ([place])   . Seizures ([place])    Past Surgical History:  Procedure Laterality Date  . TOOTH EXTRACTION  12/2014   lower left     Current Outpatient Medications  Medication Sig Dispense Refill  . diclofenac (FLECTOR) 1.3 % PTCH Place 1 patch onto the skin daily as needed (back and knee pain).    Marland Kitchen dronedarone (MULTAQ) 400 MG tablet Take 1 tablet (400 mg total) by mouth 2 (two) times daily with a meal. 180 tablet 3  . Erenumab-aooe (AIMOVIG) 140 MG/ML SOAJ Inject 140 mg into the skin every 30 (thirty) days. 3 pen 1  . fluticasone (FLONASE) 50 MCG/ACT nasal spray Place 1 spray into both nostrils daily. 16 g 3  . nortriptyline (PAMELOR) 50 MG capsule Take 2 capsules (100  mg total) by mouth at bedtime. 180 capsule 1  . pravastatin (PRAVACHOL) 20 MG tablet Take 1 tablet (20 mg total) by mouth daily. 90 tablet 3  . rOPINIRole (REQUIP) 0.25 MG tablet Take 1-2 tablets (0.25-0.5 mg total) by mouth at bedtime. 90 tablet 1  . SUMAtriptan (IMITREX) 6 MG/0.5ML SOLN injection Inject 0.5 mLs (6 mg total) into the skin 2 (two) times daily as needed for migraine or headache. May repeat in 2 hours if headache persists or recurs. 12 mL 1  . topiramate (TOPAMAX) 50 MG tablet Take one tablet in the AM and two tablets in the PM. 270 tablet 3  . triamcinolone cream (KENALOG) 0.1 % Apply 1 application topically 2 (two) times daily. 30 g 0   No current facility-administered medications for this visit.     Allergies:   Aspirin   Social History:  The patient  reports that he has quit smoking. He has never used smokeless tobacco. He reports that he does not drink alcohol or use drugs.   Family History:  The patient's family history includes Alzheimer's disease in his father; Aneurysm in his mother; Heart disease in his father; Hyperlipidemia in his father; Hypertension in his brother and father; Other in his father; Stroke in his brother.    ROS:  Please see the history of present illness.   Otherwise, review of systems is positive for none.  All other systems are reviewed and negative.    PHYSICAL EXAM: VS:  BP 128/86   Pulse 66   Ht 6\' 2"  (1.88 m)   Wt 261 lb (118.4 kg)   SpO2 97%   BMI 33.51 kg/m  , BMI Body mass index is 33.51 kg/m. GEN: Well nourished, well developed, in no acute distress  HEENT: normal  Neck: no JVD, carotid bruits, or masses Cardiac: RRR; no murmurs, rubs, or gallops,no edema  Respiratory:  clear to auscultation bilaterally, normal work of breathing GI: soft, nontender, nondistended, + BS MS: no deformity or atrophy  Skin: warm and dry Neuro:  Strength and sensation are intact Psych: euthymic mood, full affect  EKG:  EKG is ordered today.  Personal review of the ekg ordered shows this rhythm, rate 66, incomplete right bundle branch block  Recent Labs: 05/04/2018: BUN 21; Creatinine, Ser 1.00; Hemoglobin 13.9; Platelets 161; Potassium 3.5; Sodium 139 07/07/2018: ALT 25    Lipid Panel     Component Value Date/Time   CHOL 124 07/07/2018 0833   TRIG 228.0 (H) 07/07/2018 0833   TRIG 231 (HH) 12/27/2005 1005   HDL 28.90 (L) 07/07/2018 0833   CHOLHDL 4 07/07/2018 0833   VLDL 45.6 (H) 07/07/2018 0833   LDLCALC 74 10/16/2016 0900   LDLDIRECT 70.0 07/07/2018 0833     Wt Readings from Last 3 Encounters:  10/20/18 261 lb (118.4 kg)  10/14/18 260 lb 6.4 oz (118.1 kg)  09/22/18 262 lb (118.8 kg)      Other studies Reviewed: Additional studies/ records that were reviewed today include: TTE 01/09/17  Review of the above records today demonstrates:  - Left ventricle: The cavity size was normal. Wall thickness was   increased in a pattern of moderate LVH. Systolic function was   normal. The estimated ejection fraction was in the range of 55%   to 60%. Wall motion was normal; there were no regional wall   motion abnormalities. Left ventricular diastolic function   parameters were normal. - Mitral valve: There was trivial regurgitation.  Holter 02/21/18 personally reviewed NSR Frequent ventricular ectopy with isolated PVCs, bigeminy, trigeminy, ventricular couplets and triplets.  One runs of NSVT 4 beats.     ASSESSMENT AND PLAN:  1.  PVCs: 26% on most recent monitor.  He is asymptomatic from his PVCs.  He has been started on Multitak and has much less fatigue.  His ECG today shows no evidence of PVCs.  No changes at this time.    Current medicines are reviewed at length with the patient today.   The patient does not have concerns regarding his medicines.  The following changes were made today:  none  Labs/ tests ordered today include:  Orders Placed This Encounter  Procedures  . EKG 12-Lead     Disposition:   FU  with Alena Blankenbeckler 6 months  Signed, Joshawn Crissman Meredith Leeds, MD  10/20/2018 9:57 AM     [facility] [address] [place] [address] [phone] (office) [phone] (fax)

## 2018-10-20 NOTE — Telephone Encounter (Signed)
Returned call to patient to confirm appointment scheduled fot Wednesday to discuss the need for the bed.  Copied from [place] [phone]. Topic: General - Other >> Oct 19, 2018  2:43 PM Alanda Slim E wrote: Reason for CRM: Pt returned Quaniesha's call in reference to the bed he needs to order / Pt will be at work today and you can reach him tomorrow on his cell @ [phone]. he stated that he was ok with coming in office or virtual to discuss the bed with Dr. Martinique on Wednesday and that she may need to coordinate with his lung doctor (Dr. Keturah Shavers please advise >> Oct 20, 2018  1:20 PM Sheran Luz wrote: Patient requesting call back before 3:00

## 2018-10-20 NOTE — Patient Instructions (Signed)
Medication Instructions:  Your physician recommends that you continue on your current medications as directed. Please refer to the Current Medication list given to you today.  If you need a refill on your cardiac medications before your next appointment, please call your pharmacy.   Labwork: None ordered  Testing/Procedures: None ordered  Follow-Up: Your physician wants you to follow-up in: 6 months  with Dr. Camnitz.  You will receive a reminder letter in the mail two months in advance. If you don't receive a letter, please call our office to schedule the follow-up appointment.  Thank you for choosing [facility]!!   Christia Domke, RN [phone]  Any Other Special Instructions Will Be Listed Below (If Applicable).        

## 2018-10-21 ENCOUNTER — Ambulatory Visit (INDEPENDENT_AMBULATORY_CARE_PROVIDER_SITE_OTHER): Payer: Medicare HMO | Admitting: Family Medicine

## 2018-10-21 ENCOUNTER — Encounter: Payer: Self-pay | Admitting: Family Medicine

## 2018-10-21 VITALS — BP 130/88 | HR 74 | Temp 97.6°F | Resp 12 | Ht 74.0 in | Wt 261.0 lb

## 2018-10-21 DIAGNOSIS — G4733 Obstructive sleep apnea (adult) (pediatric): Secondary | ICD-10-CM

## 2018-10-21 DIAGNOSIS — Z9989 Dependence on other enabling machines and devices: Secondary | ICD-10-CM

## 2018-10-21 DIAGNOSIS — M159 Polyosteoarthritis, unspecified: Secondary | ICD-10-CM

## 2018-10-21 DIAGNOSIS — M5410 Radiculopathy, site unspecified: Secondary | ICD-10-CM | POA: Diagnosis not present

## 2018-10-21 DIAGNOSIS — G4700 Insomnia, unspecified: Secondary | ICD-10-CM | POA: Diagnosis not present

## 2018-10-21 NOTE — Patient Instructions (Addendum)
A few things to remember from today's visit:   Insomnia, unspecified type  Radicular pain of left lower extremity  OSA on CPAP   I will be sending a prescription for the hospital bed to [facility] as you requested.  In regard to your lower back pain, recommend continue following with your pain doctor.  Please be sure medication list is accurate. If a new problem present, please set up appointment sooner than planned today.

## 2018-10-21 NOTE — Progress Notes (Addendum)
HPI:  Chief Complaint  Patient presents with  . Discuss need for bed  . Right knee pain    hurts when bending, started 1 week ago    Hurlock is a 53 y.o. male, who is here today requesting a prescription for a bed. He wants to be able to raise the head and the bottom.  Currently he is sleeping on his lazy boy chair, sleeping about 3 to 4 hours due to pain. + Fatigue. He takes Nortriptyline and Topamax at bedtime.  Back pain radiated to left lower extremity with burning sensation that interferes with his sleep. He is following with pain management. He uses Diclofenac patches as needed.  Bilateral knee pain, recently he received intra-articular injection on both knees, right knee is still hurting.  He also has Hx of migraine and seizure disorder. He has not had a seizure in many years. Follows with neurologist.  OSA, recently started CPAP but difficult wearing while sleeping on recliner.  He is working part time. He mentions that at work he has to stand for several hours,wash dishes, and for other different activities that aggravate back pain.Accordoing to pt,he has a letter from occupational rehab with restrictions at work but according to pt, employer does "not care." He is waiting for occupational rehab to find a new job for him.  Review of Systems  Constitutional: Positive for fatigue. Negative for chills and fever.  Respiratory: Negative for cough, shortness of breath and wheezing.   Cardiovascular: Negative for chest pain.  Gastrointestinal: Negative for abdominal pain, nausea and vomiting.  Rest see pertinent positives and negatives per HPI.   Current Outpatient Medications on File Prior to Visit  Medication Sig Dispense Refill  . diclofenac (FLECTOR) 1.3 % PTCH Place 1 patch onto the skin daily as needed (back and knee pain).    Marland Kitchen dronedarone (MULTAQ) 400 MG tablet Take 1 tablet (400 mg total) by mouth 2 (two) times daily with a meal. 180  tablet 3  . Erenumab-aooe (AIMOVIG) 140 MG/ML SOAJ Inject 140 mg into the skin every 30 (thirty) days. 3 pen 1  . fluticasone (FLONASE) 50 MCG/ACT nasal spray Place 1 spray into both nostrils daily. 16 g 3  . nortriptyline (PAMELOR) 50 MG capsule Take 2 capsules (100 mg total) by mouth at bedtime. 180 capsule 1  . pravastatin (PRAVACHOL) 20 MG tablet Take 1 tablet (20 mg total) by mouth daily. 90 tablet 3  . rOPINIRole (REQUIP) 0.25 MG tablet Take 1-2 tablets (0.25-0.5 mg total) by mouth at bedtime. 90 tablet 1  . SUMAtriptan (IMITREX) 6 MG/0.5ML SOLN injection Inject 0.5 mLs (6 mg total) into the skin 2 (two) times daily as needed for migraine or headache. May repeat in 2 hours if headache persists or recurs. 12 mL 1  . topiramate (TOPAMAX) 50 MG tablet Take one tablet in the AM and two tablets in the PM. 270 tablet 3  . triamcinolone cream (KENALOG) 0.1 % Apply 1 application topically 2 (two) times daily. 30 g 0   No current facility-administered medications on file prior to visit.      Past Medical History:  Diagnosis Date  . Arthritis   . Baker's cyst   . Headache(784.0)   . Knee pain   . Low back pain   . Migraines   . OSA (obstructive sleep apnea) 04/10/2017  . Personality disorder ([place])   . Seizures (H[facility])    Allergies  Allergen Reactions  .  Aspirin     GI upset Other reaction(s): Vomiting    Social History   Socioeconomic History  . Marital status: Divorced    Spouse name: Not on file  . Number of children: 0  . Years of education: Not on file  . Highest education level: Not on file  Occupational History  . Occupation: disabled  Social Needs  . Financial resource strain: Hard  . Food insecurity    Worry: Never true    Inability: Never true  . Transportation needs    Medical: No    Non-medical: No  Tobacco Use  . Smoking status: Former Research scientist (life sciences)  . Smokeless tobacco: Never Used  . Tobacco comment: states he only tried as a teenager  Substance and Sexual  Activity  . Alcohol use: No  . Drug use: No  . Sexual activity: Not Currently  Lifestyle  . Physical activity    Days per week: 0 days    Minutes per session: 0 min  . Stress: Very much  Relationships  . Social Herbalist on phone: Once a week    Gets together: Never    Attends religious service: Never    Active member of club or organization: No    Attends meetings of clubs or organizations: Never    Relationship status: Divorced  Other Topics Concern  . Not on file  Social History Narrative   03/16/18: Lives alone with dog, Glenard Haring in apartment. Wife of 16 years left him about 2 years ago, and pt. has had difficulty adjusting.   Says his brother Francee Piccolo, who lives in [place], is his main contact, source of support, as well as his dog.    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 States he uses painting as additional therapy.     Vitals:   10/21/18 0928  BP: 130/88  Pulse: 74  Resp: 12  Temp: 97.6 F (36.4 C)  SpO2: 98%   Body mass index is 33.51 kg/m.   Physical Exam  Nursing note and vitals reviewed. Constitutional: He is oriented to person, place, and time. He appears well-developed. No distress.  HENT:  Head: Normocephalic and atraumatic.  Mouth/Throat: Oropharynx is clear and moist and mucous membranes are normal.  Eyes: Pupils are equal, round, and reactive to light. Conjunctivae are normal.  Cardiovascular: Normal rate. An irregular rhythm present.  No murmur heard. Respiratory: Effort normal and breath sounds normal. No respiratory distress.  GI: Soft. He exhibits no mass. There is no hepatomegaly. There is no abdominal tenderness.  Musculoskeletal:        General: No edema.     Left knee: He exhibits decreased range of motion.     Lumbar back: He exhibits no tenderness and no bony tenderness.  Neurological: He is alert and oriented to person, place, and time.  He has normal strength. Gait normal.  Skin: Skin is warm. No rash noted. No erythema.  Psychiatric: His mood appears anxious. Cognition and memory are normal.  Well groomed, good eye contact.   ASSESSMENT AND PLAN:  Mr. Shahir was seen today for discuss need for bed and right knee pain.  Diagnoses and all orders for this visit:  Insomnia, unspecified type Seems to be aggravated by pain. Good sleep hygiene discussed. Hospital bed Rx will be faxed to [facility] as requested.  Radicular pain of left lower extremity Continue following with pain management and ortho. Try to  avoid exacerbating factors.  -     For home use only DME Hospital bed  OSA on CPAP Some adverse effects of OSA discussed as well as benefits of wearing CPAP.  -     For home use only DME Hospital bed  Generalized osteoarthritis of multiple sites Acetaminophen 500 mg 3-4 times per day. Topical Voltaren may help. Most affected joint is knee,states that surgery was not offered because he is a "very high risk" pt. Continue following with ortho.  -     For home use only DME Hospital bed  Patient requires positioning of the body in ways not feasible with an ordinary bed in order to alleviate pain and  Mr. Cancellieri has the need for frequent changes in body position by it.  25 min face to face OV. > 50% was dedicated to discussion of Dx, prognosis, and treatment options.  Return if symptoms worsen or fail to improve, for Keep next f/u app.   Betty G. Martinique, MD  [facility]. [place] office.

## 2018-10-23 DIAGNOSIS — G4733 Obstructive sleep apnea (adult) (pediatric): Secondary | ICD-10-CM | POA: Diagnosis not present

## 2018-10-24 ENCOUNTER — Encounter: Payer: Self-pay | Admitting: Family Medicine

## 2018-10-26 NOTE — Telephone Encounter (Signed)
Patient informed that order for bed faxed to [facility] per Dr. Doug Sou orders. Patient verbalized understanding.

## 2018-10-26 NOTE — Telephone Encounter (Signed)
Patient called in stating that he is checking on status of bed. Please advise.

## 2018-10-27 DIAGNOSIS — M5416 Radiculopathy, lumbar region: Secondary | ICD-10-CM | POA: Diagnosis not present

## 2018-10-28 DIAGNOSIS — M5136 Other intervertebral disc degeneration, lumbar region: Secondary | ICD-10-CM | POA: Diagnosis not present

## 2018-10-28 DIAGNOSIS — M5416 Radiculopathy, lumbar region: Secondary | ICD-10-CM | POA: Diagnosis not present

## 2018-10-28 NOTE — Telephone Encounter (Signed)
Patient states order for the bed needs to be sent to company below.    [facility] Phone# [phone] opt 2  Fax# [phone]  Patient also notes that he had epidural done yesterday.  Lower lumbar deteriorating- left side-  Patient states surgery cannot be done at this time due to being high risk because of seziure activity.   And also that he received flu shot from [facility] yesterday.

## 2018-10-28 NOTE — Telephone Encounter (Signed)
Noted. Order for bed faxed to [facility] as requested.

## 2018-10-29 NOTE — Telephone Encounter (Signed)
Pt called and stated that [facility] states that they will need OV note regarding bed so that he can get this bed. Please advise

## 2018-11-05 DIAGNOSIS — F819 Developmental disorder of scholastic skills, unspecified: Secondary | ICD-10-CM | POA: Diagnosis not present

## 2018-11-05 DIAGNOSIS — F2089 Other schizophrenia: Secondary | ICD-10-CM | POA: Diagnosis not present

## 2018-11-10 ENCOUNTER — Telehealth: Payer: Self-pay | Admitting: *Deleted

## 2018-11-10 NOTE — Telephone Encounter (Signed)
Copied from [place] [id number]. Topic: General - Other >> Nov 06, 2018  1:03 PM Pauline Good wrote: Reason for CRM: pt need call back asap to speak to nurse about [place] forms that was sent to office and needed to be signed and faxed back. Pt was upset b/c [facility] haven't received forms back yet >> Nov 10, 2018  1:16 PM Leward Quan A wrote: Patient called to check on a fax that was supposed to have been completed and sent back since last week so that he can get his medical equipment. Patient ask that whom ever send the fax to contact Lisa at Ph# [phone] ext# [phone]

## 2018-11-10 NOTE — Telephone Encounter (Signed)
Copied from [place] [id number]. Topic: General - Other >> Nov 10, 2018  1:35 PM Pauline Good wrote: Reason for CRM: want to know status of fax that was sent over to be signed for pt to get hospital bed. Please call to advise

## 2018-11-11 NOTE — Telephone Encounter (Signed)
Spoke with patient and informed him that provider has forms and as soon as they are complete, I will send to the [facility]. Patient verbalized understanding.

## 2018-11-11 NOTE — Telephone Encounter (Signed)
Spoke with patient and informed him that provider has forms and as soon as they are complete, I will send to the [facility]. Patient verbalized understanding.

## 2018-11-11 NOTE — Telephone Encounter (Signed)
Spoke with patient and informed him that provider has forms and as soon as they are complete, I will send to the [facility]. Patient verbalized understanding.

## 2018-11-13 ENCOUNTER — Telehealth: Payer: Self-pay | Admitting: *Deleted

## 2018-11-13 NOTE — Telephone Encounter (Signed)
Spoke with patient on 11/11/2018 and informed him that PCP was completing forms and they would be faxed as soon as she finishes them. Patient verbalized understanding.  Copied from [place] [phone]. Topic: General - Other >> Nov 05, 2018 10:44 AM Rayann Heman wrote: Reason for CRM: pt called and stated that [employer] [facility] faxed over forms for a hospital bed. Pt states these need to be filled out as soon as possible. Please advise   [employer]# Lattie Haw [phone] ex [phone]

## 2018-11-16 NOTE — Telephone Encounter (Signed)
Office note faxed with updated information needed by provider.

## 2018-11-16 NOTE — Telephone Encounter (Signed)
Office note faxed to [employer] with updated information needed by provider.

## 2018-11-17 ENCOUNTER — Telehealth: Payer: Self-pay

## 2018-11-17 NOTE — Telephone Encounter (Signed)
PA for aimovig has been submitted via cover my meds to [place].  (Key: [id number])  Your information has been sent to [facility].

## 2018-11-17 NOTE — Telephone Encounter (Signed)
Received fax from [facility] stating [place] has been approved through 11/17/2018-02/25/2019.

## 2018-11-18 NOTE — Telephone Encounter (Signed)
Patient calling and states that he would like an update on the forms that were being filled out for a hospital bed. States that it has been over 24 and 36 hours. Advised on understanding, but I did not have any further updates. Would like a call back to discuss.

## 2018-11-20 ENCOUNTER — Encounter: Payer: Medicare HMO | Admitting: Family Medicine

## 2018-11-20 NOTE — Telephone Encounter (Signed)
Pt is calling checking on the status of bed. Please call pt

## 2018-11-20 NOTE — Telephone Encounter (Signed)
Patient called again to let the office know that there is a paper being faxed now that needs to be signed by the doctor to complete the order.  Please have the doctor sign as soon as possible.  CB# for questions is [phone]

## 2018-11-20 NOTE — Telephone Encounter (Signed)
Lattie Haw calling from [facility] is calling to request orders Hospital Bed. Two Documents are needing Dr. Doug Sou signature. Please advise. Lattie Haw will refax please advise.  Lattie Haw faxed the documents  On 10/30/2018, refaxed 11/09/2018. Lattie Haw is requesting a response. Please advise [phone]

## 2018-11-20 NOTE — Telephone Encounter (Signed)
Spoke with patient and informed him that paperwork was faxed on 10/28/2018 and then refaxed on 11/16/2018. Patient stated that he would call [employer].

## 2018-11-23 DIAGNOSIS — G4733 Obstructive sleep apnea (adult) (pediatric): Secondary | ICD-10-CM | POA: Diagnosis not present

## 2018-11-23 NOTE — Telephone Encounter (Signed)
Pt said [facility] fax the form on Friday 11-20-2018

## 2018-11-24 NOTE — Telephone Encounter (Signed)
Same patient as I sent before, different message. Please advise, thank you

## 2018-11-24 NOTE — Telephone Encounter (Signed)
Form has been faxed.

## 2018-11-24 NOTE — Telephone Encounter (Signed)
Pt stated that [facility] sent another fax on 11/20/18 regarding insurance. This is the only documentation that is missing in order for pt to get bed. Requesting for someone to call Lattie Haw with [employer] [phone] ex. [phone] so that this form may get filled out by Dr. Regis Bill and sent back.

## 2018-11-24 NOTE — Telephone Encounter (Signed)
Do you know anything about this? Please advise, thank you

## 2018-11-24 NOTE — Telephone Encounter (Signed)
Forms presented to PCP for signature. Clinic RN has faxed forms to [facility]

## 2018-11-27 DIAGNOSIS — M159 Polyosteoarthritis, unspecified: Secondary | ICD-10-CM | POA: Diagnosis not present

## 2018-12-23 DIAGNOSIS — G4733 Obstructive sleep apnea (adult) (pediatric): Secondary | ICD-10-CM | POA: Diagnosis not present

## 2018-12-28 DIAGNOSIS — M159 Polyosteoarthritis, unspecified: Secondary | ICD-10-CM | POA: Diagnosis not present

## 2018-12-30 DIAGNOSIS — F2089 Other schizophrenia: Secondary | ICD-10-CM | POA: Diagnosis not present

## 2018-12-31 ENCOUNTER — Ambulatory Visit: Payer: Self-pay

## 2018-12-31 NOTE — Telephone Encounter (Signed)
Patient called stating that he was cleaning his right ear and got the cotton stuck in his ear today. He states he has had several people look and they do not see the  cotton. He states that he wears hearing aids and can't hear from that ear. He has no drainage. No pain. Care advice read to patient. He verbalized understanding. Call transferred to office for scheduling.  Reason for Disposition . [1] Foreign body AND [2] can't remove at home  Answer Assessment - Initial Assessment Questions 1. OBJECT: "What do you think it is?"      Cotton from Q tip 2. PAIN: "Is it causing any pain?" If so, "How bad is it?" (e.g., mild, moderate, severe)     none 3. ONSET: "How long do you think it's been in there?"     today 4. DISCHARGE: "Has there been any discharge or bleeding from the ear?" If so, "Please describe it".     no 5. PREGNANCY: "Is there any chance you are pregnant?" "When was your last menstrual period?"   N/A  Protocols used: EAR - FOREIGN BODY-A-AH

## 2019-01-01 ENCOUNTER — Encounter: Payer: Self-pay | Admitting: Family Medicine

## 2019-01-01 ENCOUNTER — Other Ambulatory Visit: Payer: Self-pay

## 2019-01-01 ENCOUNTER — Ambulatory Visit (INDEPENDENT_AMBULATORY_CARE_PROVIDER_SITE_OTHER): Payer: Medicare HMO | Admitting: Family Medicine

## 2019-01-01 VITALS — BP 130/70 | HR 78 | Temp 97.8°F | Resp 16 | Wt 262.8 lb

## 2019-01-01 DIAGNOSIS — H9011 Conductive hearing loss, unilateral, right ear, with unrestricted hearing on the contralateral side: Secondary | ICD-10-CM | POA: Diagnosis not present

## 2019-01-01 DIAGNOSIS — T161XXA Foreign body in right ear, initial encounter: Secondary | ICD-10-CM | POA: Diagnosis not present

## 2019-01-01 MED ORDER — NEOMYCIN-POLYMYXIN-HC 3.5-10000-1 OT SOLN: 3.0000 [drp] | Freq: Three times a day (TID) | OTIC | 0 refills | Status: AC

## 2019-01-01 NOTE — Progress Notes (Signed)
ACUTE VISIT   HPI:  Chief Complaint  Patient presents with  . Foreign Body in [place] is a 53 y.o. male with hx of allergies,chronic pain,OSA c/o right ear hearing loss die to piece of cotton left in ear canal after cleaning ear with a q tip 2 days ago. Problem is constant.  Yesterday he had tinnitus, resolved. He has not noted drainage or earache.  No recent URI or travel.  Denies fever,chills,sore throat,nasa; congestion,or rhinorrhea.  C/O worsening skin lesion left ear, tragus. He was referred to dermatologist. He states that he missed phone call. Lesion is scaly.  Review of Systems  Constitutional: Negative for activity change and appetite change.  Respiratory: Negative for cough and wheezing.   Musculoskeletal: Positive for arthralgias and back pain.  Rest see pertinent positives and negatives per HPI.   Current Outpatient Medications on File Prior to Visit  Medication Sig Dispense Refill  . diclofenac (FLECTOR) 1.3 % PTCH Place 1 patch onto the skin daily as needed (back and knee pain).    Marland Kitchen dronedarone (MULTAQ) 400 MG tablet Take 1 tablet (400 mg total) by mouth 2 (two) times daily with a meal. 180 tablet 3  . Erenumab-aooe (AIMOVIG) 140 MG/ML SOAJ Inject 140 mg into the skin every 30 (thirty) days. 3 pen 1  . fluticasone (FLONASE) 50 MCG/ACT nasal spray Place 1 spray into both nostrils daily. 16 g 3  . nortriptyline (PAMELOR) 50 MG capsule Take 2 capsules (100 mg total) by mouth at bedtime. 180 capsule 1  . pravastatin (PRAVACHOL) 20 MG tablet Take 1 tablet (20 mg total) by mouth daily. 90 tablet 3  . rOPINIRole (REQUIP) 0.25 MG tablet Take 1-2 tablets (0.25-0.5 mg total) by mouth at bedtime. 90 tablet 1  . SUMAtriptan (IMITREX) 6 MG/0.5ML SOLN injection Inject 0.5 mLs (6 mg total) into the skin 2 (two) times daily as needed for migraine or headache. May repeat in 2 hours if headache persists or recurs. 12 mL 1  . topiramate (TOPAMAX)  50 MG tablet Take one tablet in the AM and two tablets in the PM. 270 tablet 3  . triamcinolone cream (KENALOG) 0.1 % Apply 1 application topically 2 (two) times daily. 30 g 0   No current facility-administered medications on file prior to visit.      Past Medical History:  Diagnosis Date  . Arthritis   . Baker's cyst   . Headache(784.0)   . Knee pain   . Low back pain   . Migraines   . OSA (obstructive sleep apnea) 04/10/2017  . Personality disorder ([place])   . Seizures (HCC)    Allergies  Allergen Reactions  . Aspirin     GI upset Other reaction(s): Vomiting    Social History   Socioeconomic History  . Marital status: Divorced    Spouse name: Not on file  . Number of children: 0  . Years of education: Not on file  . Highest education level: Not on file  Occupational History  . Occupation: disabled  Social Needs  . Financial resource strain: Hard  . Food insecurity    Worry: Never true    Inability: Never true  . Transportation needs    Medical: No    Non-medical: No  Tobacco Use  . Smoking status: Former Research scientist (life sciences)  . Smokeless tobacco: Never Used  . Tobacco comment: states he only tried as a teenager  Substance and Sexual Activity  .  Alcohol use: No  . Drug use: No  . Sexual activity: Not Currently  Lifestyle  . Physical activity    Days per week: 0 days    Minutes per session: 0 min  . Stress: Very much  Relationships  . Social Herbalist on phone: Once a week    Gets together: Never    Attends religious service: Never    Active member of club or organization: No    Attends meetings of clubs or organizations: Never    Relationship status: Divorced  Other Topics Concern  . Not on file  Social History Narrative   03/16/18: Lives alone with dog, Steven Vincent in apartment. Wife of 16 years left him about 2 years ago, and pt. has had difficulty adjusting.   Says his brother Steven Vincent, who lives in [place], is his main contact, source of support, as well as his  dog.    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 States he uses painting as additional therapy.     Vitals:   01/01/19 0857  BP: 130/70  Pulse: 78  Resp: 16  Temp: 97.8 F (36.6 C)  SpO2: 98%   Body mass index is 33.74 kg/m.   Physical Exam  Nursing note and vitals reviewed. Constitutional: He is oriented to person, place, and time. He appears well-developed. He does not appear ill. No distress.  HENT:  Head: Normocephalic and atraumatic.  Right Ear: External ear normal.  Left Ear: Tympanic membrane, external ear and ear canal normal.  Mouth/Throat: Oropharynx is clear and moist and mucous membranes are normal.  Right ear with cotton piece obstructing ear canal. Hearing aids bilateral.  Eyes: Conjunctivae and EOM are normal.  Cardiovascular: Normal rate and regular rhythm.  No murmur heard. Respiratory: Effort normal and breath sounds normal. No respiratory distress.  Lymphadenopathy:       Head (right side): No submandibular adenopathy present.       Head (left side): No submandibular adenopathy present.    He has no cervical adenopathy.  Neurological: He is alert and oriented to person, place, and time. He has normal strength.  Skin: Skin is warm. No rash noted. No erythema.  Psychiatric: His mood appears anxious.  Well groomed, good eye contact.   ASSESSMENT AND PLAN:  [name] was seen today for foreign body in ear.  Diagnoses and all orders for this visit:  Foreign body of right ear, initial encounter  Conductive hearing loss of right ear, unspecified hearing status on contralateral side  Other orders -     neomycin-polymyxin-hydrocortisone (CORTISPORIN) OTIC solution; Place 3 drops into the right ear 3 (three) times daily for 7 days.   Using a curette I removed part of cotton piece. After verbal consent ear lavage done to remove rest of cotton, successful.  Hearing back to his baseline. Strongly recommended avoiding Q tip use. He tolerated procedure well. TM is not erythematous.  Mild erythema ear canal , so abx+ steroid ear drops recommended x 7 days. Instructed about warning signs. F/U as needed.   Dermatology information given,so he canm call and schedule his appt.  Return if symptoms worsen or fail to improve.    Imre Vecchione G. Martinique, MD  [facility]. [place] office.

## 2019-01-01 NOTE — Telephone Encounter (Signed)
Pt has already been seen in office.  

## 2019-01-01 NOTE — Patient Instructions (Signed)
A few things to remember from today's visit:   Foreign body of right ear, initial encounter  Referral uploaded to [facility] clinic in [place], [place] Address: [address], [place], [place] [address] Phone: [phone]   Please be sure medication list is accurate. If a new problem present, please set up appointment sooner than planned today.

## 2019-01-23 DIAGNOSIS — G4733 Obstructive sleep apnea (adult) (pediatric): Secondary | ICD-10-CM | POA: Diagnosis not present

## 2019-01-26 NOTE — Progress Notes (Signed)
PATIENT: Steven Vincent DOB: Feb 21, 1966  REASON FOR VISIT: follow up HISTORY FROM: patient  HISTORY OF PRESENT ILLNESS: Today 01/27/19  Steven Vincent is a 53 year old male with history of migraine headache and seizures.  His seizures have been well controlled with Topamax.  He has sleep apnea and uses CPAP.  He remains on nortriptyline and Aimovig for migraines.  He uses Imitrex injection if needed.  He has not had recurrent seizure.  He indicates he is sleeping better with nortriptyline 100 mg at bedtime, but it does take about an hour for the medication to work.  Since starting Aimovig, he has had a 75% improvement in his headaches.  He is now only having 1 headache a month.  He will take an Imitrex injection with headache relief.  He says he follows with pain management for chronic back pain.  He has patches on hand to use if needed for pain.  He says he is no longer able to get epidural injections, because his lower lumbar spine is deteriorating.  He is working at [employer].  He tells me about his 40 years of involvement in [employer].  He indicates he is tolerating his medications without side effect.  He presents today for follow-up unaccompanied.  HISTORY 07/28/2018 Dr. Jannifer Franklin: Mr. Lulay is a 53 year old right-handed white male with a history of migraine headache and seizures.  The patient has been on Topamax with good control of his seizures, his migraine headaches have unfortunately increased in frequency recently.  He has gone from having an average about 1 headache a week to about 3 a week.  He takes nortriptyline 75 mg at night.  He notes that weather changes have a big impact on his headache frequency.  He will take Maxalt if needed for the headache, but he oftentimes has to take 2 a day when the headache comes on.  In the past, he has gotten much better benefit from the injectable Imitrex.  He has sleep apnea, he recently was able to get back on his CPAP.  He returns  this office for an evaluation.  He has not had any recurrent seizures.   REVIEW OF SYSTEMS: Out of a complete 14 system review of symptoms, the patient complains only of the following symptoms, and all other reviewed systems are negative.  Seizures, headache  ALLERGIES: Allergies  Allergen Reactions   Aspirin     GI upset Other reaction(s): Vomiting    HOME MEDICATIONS: Outpatient Medications Prior to Visit  Medication Sig Dispense Refill   diclofenac (FLECTOR) 1.3 % PTCH Place 1 patch onto the skin daily as needed (back and knee pain).     dronedarone (MULTAQ) 400 MG tablet Take 1 tablet (400 mg total) by mouth 2 (two) times daily with a meal. 180 tablet 3   fluticasone (FLONASE) 50 MCG/ACT nasal spray Place 1 spray into both nostrils daily. 16 g 3   nortriptyline (PAMELOR) 50 MG capsule Take 2 capsules (100 mg total) by mouth at bedtime. 180 capsule 1   pravastatin (PRAVACHOL) 20 MG tablet Take 1 tablet (20 mg total) by mouth daily. 90 tablet 3   rOPINIRole (REQUIP) 0.25 MG tablet Take 1-2 tablets (0.25-0.5 mg total) by mouth at bedtime. 90 tablet 1   SUMAtriptan (IMITREX) 6 MG/0.5ML SOLN injection Inject 0.5 mLs (6 mg total) into the skin 2 (two) times daily as needed for migraine or headache. May repeat in 2 hours if headache persists or recurs. 12 mL 1  SUMAtriptan 6 MG/0.5ML SOAJ      topiramate (TOPAMAX) 50 MG tablet Take one tablet in the AM and two tablets in the PM. 270 tablet 3   triamcinolone cream (KENALOG) 0.1 % Apply 1 application topically 2 (two) times daily. 30 g 0   Erenumab-aooe (AIMOVIG) 140 MG/ML SOAJ Inject 140 mg into the skin every 30 (thirty) days. 3 pen 1   No facility-administered medications prior to visit.     PAST MEDICAL HISTORY: Past Medical History:  Diagnosis Date   Arthritis    Baker's cyst    Headache(784.0)    Knee pain    Low back pain    Migraines    OSA (obstructive sleep apnea) 04/10/2017   Personality disorder  ([place])    Seizures ([place])     PAST SURGICAL HISTORY: Past Surgical History:  Procedure Laterality Date   TOOTH EXTRACTION  12/2014   lower left    FAMILY HISTORY: Family History  Problem Relation Age of Onset   Heart disease Father        defibrilator   Hyperlipidemia Father    Hypertension Father    Alzheimer's disease Father    Other Father        stents in legs   Aneurysm Mother        brain   Hypertension Brother    Stroke Brother        X2    SOCIAL HISTORY: Social History   Socioeconomic History   Marital status: Divorced    Spouse name: Not on file   Number of children: 0   Years of education: Not on file   Highest education level: Not on file  Occupational History   Occupation: disabled  Social Designer, fashion/clothing strain: Hard   Food insecurity    Worry: Never true    Inability: Never true   Transportation needs    Medical: No    Non-medical: No  Tobacco Use   Smoking status: Former Smoker   Smokeless tobacco: Never Used   Tobacco comment: states he only tried as a teenager  Substance and Sexual Activity   Alcohol use: No   Drug use: No   Sexual activity: Not Currently  Lifestyle   Physical activity    Days per week: 0 days    Minutes per session: 0 min   Stress: Very much  Relationships   Social connections    Talks on phone: Once a week    Gets together: Never    Attends religious service: Never    Active member of club or organization: No    Attends meetings of clubs or organizations: Never    Relationship status: Divorced   Intimate partner violence    Fear of current or ex partner: No    Emotionally abused: No    Physically abused: No    Forced sexual activity: No  Other Topics Concern   Not on file  Social History Narrative   03/16/18: 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 Wife of 16 years left him about 2 years ago, and pt. has had difficulty adjusting.   Says his brother Francee Piccolo, who lives  in [place], is his main contact, source of support, as well as his dog.    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 States he uses painting as additional therapy.     PHYSICAL EXAM  Vitals:   01/27/19 1004  BP: 140/82  Pulse: 79  Temp: (!) 97.5 F (36.4 C)  TempSrc: Oral  Weight: 268 lb (121.6 kg)  Height: 6\' 2"  (1.88 m)   Body mass index is 34.41 kg/m.  Generalized: Well developed, in no acute distress   Neurological examination  Mentation: Alert oriented to time, place, history taking. Follows all commands speech and language fluent Cranial nerve II-XII: Pupils were equal round reactive to light. Extraocular movements were full, visual field were full on confrontational test. Facial sensation and strength were normal.  Head turning and shoulder shrug  were normal and symmetric. Motor: The motor testing reveals 5 over 5 strength of all 4 extremities. Good symmetric motor tone is noted throughout.  Sensory: Sensory testing is intact to soft touch on all 4 extremities. No evidence of extinction is noted.  Coordination: Cerebellar testing reveals good finger-nose-finger and heel-to-shin bilaterally.  Gait and station: Gait is somewhat wild based, but steady. Reflexes: Deep tendon reflexes are symmetric   DIAGNOSTIC DATA (LABS, IMAGING, TESTING) - I reviewed patient records, labs, notes, testing and imaging myself where available.  Lab Results  Component Value Date   WBC 6.6 05/04/2018   HGB 13.9 05/04/2018   HCT 42.2 05/04/2018   MCV 95.0 05/04/2018   PLT 161 05/04/2018      Component Value Date/Time   NA 139 05/04/2018 1815   NA 140 01/19/2014 1041   K 3.5 05/04/2018 1815   CL 110 05/04/2018 1815   CO2 22 05/04/2018 1815   GLUCOSE 98 05/04/2018 1815   BUN 21 (H) 05/04/2018 1815   BUN 18 01/19/2014 1041   CREATININE 1.00 05/04/2018 1815   CALCIUM 8.7 (L) 05/04/2018 1815    PROT 7.3 07/07/2018 0833   PROT 7.0 01/19/2014 1041   ALBUMIN 4.5 07/07/2018 0833   ALBUMIN 4.7 01/19/2014 1041   AST 24 07/07/2018 0833   ALT 25 07/07/2018 0833   ALKPHOS 71 07/07/2018 0833   BILITOT 0.5 07/07/2018 0833   GFRNONAA >60 05/04/2018 1815   GFRAA >60 05/04/2018 1815   Lab Results  Component Value Date   CHOL 124 07/07/2018   HDL 28.90 (L) 07/07/2018   LDLCALC 74 10/16/2016   LDLDIRECT 70.0 07/07/2018   TRIG 228.0 (H) 07/07/2018   CHOLHDL 4 07/07/2018   Lab Results  Component Value Date   HGBA1C 4.9 09/21/2018   Lab Results  Component Value Date   [id number] 10/16/2016   Lab Results  Component Value Date   TSH 2.51 03/17/2017    ASSESSMENT AND PLAN 53 y.o. year old male  has a past medical history of Arthritis, Baker's cyst, Headache(784.0), Knee pain, Low back pain, Migraines, OSA (obstructive sleep apnea) (04/10/2017), Personality disorder ([place]), and Seizures ([place]). here with:  1.  Chronic migraine headache 2.  Seizures, well controlled  Overall, he is doing well.  His headaches have improved 75% with Aimovig.  He will remain on 140 mg Aimovig monthly injection, Imitrex injection as needed.  He will continue taking Topamax 50 mg tablet, 1 tablet in the morning, 2 tablets in the evening.  He will continue taking nortriptyline 100 mg at bedtime, this has been helpful for his sleep.  His heart rate is normal today, at 79.  He will follow-up in 6 months or sooner if needed.  I did advise if his symptoms worsen or if he develops any new symptoms he should let us know.  I spent 15 minutes with the patient. 50% of this time was spent discussing  his plan of care.  Butler Denmark, AGNP-C, DNP 01/27/2019, 10:36 AM [facility] [address], [place], [place] [address] [phone]

## 2019-01-27 ENCOUNTER — Ambulatory Visit (INDEPENDENT_AMBULATORY_CARE_PROVIDER_SITE_OTHER): Payer: Medicare HMO | Admitting: Neurology

## 2019-01-27 ENCOUNTER — Other Ambulatory Visit: Payer: Self-pay

## 2019-01-27 ENCOUNTER — Encounter: Payer: Self-pay | Admitting: Neurology

## 2019-01-27 VITALS — BP 140/82 | HR 79 | Temp 97.5°F | Ht 74.0 in | Wt 268.0 lb

## 2019-01-27 DIAGNOSIS — G43009 Migraine without aura, not intractable, without status migrainosus: Secondary | ICD-10-CM

## 2019-01-27 DIAGNOSIS — M159 Polyosteoarthritis, unspecified: Secondary | ICD-10-CM | POA: Diagnosis not present

## 2019-01-27 DIAGNOSIS — R569 Unspecified convulsions: Secondary | ICD-10-CM | POA: Diagnosis not present

## 2019-01-27 MED ORDER — AIMOVIG 140 MG/ML ~~LOC~~ SOAJ: 140.0000 mg | SUBCUTANEOUS | 1 refills | Status: DC

## 2019-01-27 NOTE — Patient Instructions (Signed)
Continue current medications (Aimovig, nortriptyline, topamax, imitrex injections)  I am glad your headaches are better :)  Return in 6 months

## 2019-01-30 NOTE — Progress Notes (Signed)
I have read the note, and I agree with the clinical assessment and plan.  Steven Vincent K Amirr Achord   

## 2019-02-05 DIAGNOSIS — H524 Presbyopia: Secondary | ICD-10-CM | POA: Diagnosis not present

## 2019-02-05 DIAGNOSIS — H52209 Unspecified astigmatism, unspecified eye: Secondary | ICD-10-CM | POA: Diagnosis not present

## 2019-02-05 DIAGNOSIS — H5203 Hypermetropia, bilateral: Secondary | ICD-10-CM | POA: Diagnosis not present

## 2019-02-05 DIAGNOSIS — H269 Unspecified cataract: Secondary | ICD-10-CM | POA: Diagnosis not present

## 2019-02-12 DIAGNOSIS — F819 Developmental disorder of scholastic skills, unspecified: Secondary | ICD-10-CM | POA: Diagnosis not present

## 2019-02-22 ENCOUNTER — Other Ambulatory Visit: Payer: Self-pay | Admitting: Family Medicine

## 2019-02-22 DIAGNOSIS — G4733 Obstructive sleep apnea (adult) (pediatric): Secondary | ICD-10-CM | POA: Diagnosis not present

## 2019-02-22 DIAGNOSIS — G2581 Restless legs syndrome: Secondary | ICD-10-CM

## 2019-02-27 DIAGNOSIS — M159 Polyosteoarthritis, unspecified: Secondary | ICD-10-CM | POA: Diagnosis not present

## 2019-03-11 ENCOUNTER — Telehealth: Payer: Self-pay | Admitting: Neurology

## 2019-03-11 NOTE — Telephone Encounter (Signed)
PA completed through [employer] on cover my meds. [id number] Response-Authorization already on file for this request.

## 2019-03-17 ENCOUNTER — Encounter: Payer: Self-pay | Admitting: Family Medicine

## 2019-03-17 ENCOUNTER — Other Ambulatory Visit: Payer: Self-pay

## 2019-03-17 ENCOUNTER — Ambulatory Visit (INDEPENDENT_AMBULATORY_CARE_PROVIDER_SITE_OTHER): Payer: Medicare HMO | Admitting: Family Medicine

## 2019-03-17 VITALS — BP 130/80 | HR 96 | Resp 12 | Ht 74.0 in | Wt 273.0 lb

## 2019-03-17 DIAGNOSIS — G4700 Insomnia, unspecified: Secondary | ICD-10-CM

## 2019-03-17 DIAGNOSIS — Z125 Encounter for screening for malignant neoplasm of prostate: Secondary | ICD-10-CM | POA: Diagnosis not present

## 2019-03-17 DIAGNOSIS — K5900 Constipation, unspecified: Secondary | ICD-10-CM

## 2019-03-17 DIAGNOSIS — G2581 Restless legs syndrome: Secondary | ICD-10-CM

## 2019-03-17 DIAGNOSIS — M5410 Radiculopathy, site unspecified: Secondary | ICD-10-CM | POA: Diagnosis not present

## 2019-03-17 DIAGNOSIS — R5382 Chronic fatigue, unspecified: Secondary | ICD-10-CM | POA: Diagnosis not present

## 2019-03-17 DIAGNOSIS — Z0000 Encounter for general adult medical examination without abnormal findings: Secondary | ICD-10-CM

## 2019-03-17 DIAGNOSIS — R569 Unspecified convulsions: Secondary | ICD-10-CM

## 2019-03-17 LAB — PSA: PSA: 0.23 ng/mL (ref 0.10–4.00)

## 2019-03-17 MED ORDER — ROPINIROLE HCL 0.5 MG PO TABS: 0.5000 mg | ORAL_TABLET | Freq: Every day | ORAL | 1 refills | Status: DC

## 2019-03-17 MED ORDER — MELOXICAM 15 MG PO TABS: 15.0000 mg | ORAL_TABLET | Freq: Every day | ORAL | 1 refills | Status: DC

## 2019-03-17 NOTE — Progress Notes (Signed)
HPI:  [name] is a 54 y.o.male here today for his AWV and chronic disease management.  Last AWV: 03/16/18. He is on disability due to mild mental cognitive disability and psychiatric condition. He lives with a roommate. Frustrated because he "cannot get a grip"of things in general,he does not elaborate.  Independent ADL's and IADL's. No falls in the past year and denies depression symptoms.  Functional Status Survey: Is the patient deaf or have difficulty hearing?: Yes(Sometime if there is background noise.) Does the patient have difficulty seeing, even when wearing glasses/contacts?: No Does the patient have difficulty concentrating, remembering, or making decisions?: No Does the patient have difficulty walking or climbing stairs?: Yes(Due to OA, he doe snot need assistance.) Does the patient have difficulty dressing or bathing?: No Does the patient have difficulty doing errands alone such as visiting a doctor's office or shopping?: No  Fall Risk  03/19/2019 03/16/2018 03/04/2017  Falls in the past year? 0 0 No  Number falls in past yr: 0 - -  Injury with Fall? 0 - -  Risk for fall due to : Orthopedic patient - -  Follow up Education provided - -    Providers he sees regularly: Neurologist: Dr Jannifer Franklin, last visit 01/27/19. Pulmonologist, Dr [name], OSA. Cardiologist, Dr Percival Spanish.  Depression screen [facility] 2/9 03/17/2019  Decreased Interest 0  Down, Depressed, Hopeless 0  PHQ - 2 Score 0  Altered sleeping -  Tired, decreased energy -  Change in appetite -  Feeling bad or failure about yourself  -  Trouble concentrating -  Moving slowly or fidgety/restless -  Suicidal thoughts -  PHQ-9 Score -  Difficult doing work/chores -  Some recent data might be hidden   Mini-Cog - 03/19/19 1620    Normal clock drawing test?  yes    How many words correct?  3       Hearing Screening   125Hz  250Hz  500Hz  1000Hz  2000Hz  3000Hz  4000Hz  6000Hz  8000Hz   Right ear:            Left ear:           Vision Screening Comments: Refused.Reports eye exam done recently.  Regular exercise 3 or more times per week: He does not have an exercise routine but he walks "all the time." Following a healthy diet: Not consistently. He enjoys painting on canvas.  He doe snot keep in touch with family. He has a brother that is alcoholic and has drug addiction. States that he talks with his ancle and aunt but they do not want him to visit. He works part time at [employer].  Chronic medical problems: cardiac arrhythmia,HLD,back pain, arthralgias,seizure disorder,OSA,migraines,and personality disorder. He is not longer following with psychiatrist.  He has not had seizure episodes, he takes Topamax.  Immunization History  Administered Date(s) Administered  . Influenza Split 12/03/2010  . Influenza Whole 12/04/2006, 11/17/2007, 01/11/2009  . Influenza,inj,Quad PF,6+ Mos 10/25/2018  . Td 12/04/2006   Last colon cancer screening: 09/21/2015. Last prostate ca screening: 07/2015 PSA 0.3. Nocturia x 0  -Denies high alcohol intake, tobacco use, or Hx of illicit drug use.  CV risk factors: Arrhythmia (PVC's),HLD. He is on Pravastatin 20 mg daily.  Lab Results  Component Value Date   CHOL 124 07/07/2018   HDL 28.90 (L) 07/07/2018   LDLCALC 74 10/16/2016   LDLDIRECT 70.0 07/07/2018   TRIG 228.0 (H) 07/07/2018   CHOLHDL 4 07/07/2018   -Concerns and/or follow up today:   -  Chronic pain: States that he cannot have pain medication because some of his chronic medical problems. Pain is aggravated by prolonged walking/standing,and going up/down stairs. Sometimes pain limits his daily activities.  Knee pain, right hip pain, low back pain,and left lower extremity pain and burning sensation.  -Insomnia: Sleeps about 3 hours. States that medication he has been prescribed is not helping. According to pt,he cannot take OTC sleep aids.  + Fatigue,which has improved some since he is  wearing CPAP. He has not used CPAP more than 4 hours because insomnia. He takes naps on the couch  -RLS: He is on Requip 0.25 mg ,which helps "sometimes." He moves his legs all night. He has a hospital bed now but it is not big enough for him, he still uses it.  Constipation:He has bowel movements q 3 days. No blood  In stool. Negative for abdominal pain,N/V,or abnormal wt loss.  He has not tried OTC medications.  Review of Systems  Constitutional: Negative for activity change, appetite change and fever.  HENT: Negative for nosebleeds, sore throat and trouble swallowing.   Eyes: Negative for redness and visual disturbance.  Respiratory: Negative for cough, shortness of breath and wheezing.   Cardiovascular: Positive for palpitations. Negative for chest pain and leg swelling.  Gastrointestinal: Negative for blood in stool and rectal pain.  Genitourinary: Negative for decreased urine volume, dysuria and hematuria.  Allergic/Immunologic: Positive for environmental allergies.  Neurological: Negative for speech difficulty, weakness and headaches.  Psychiatric/Behavioral: Negative for confusion and hallucinations. The patient is nervous/anxious.   Rest of ROS refer to positives and negatives in HPI.   Current Outpatient Medications on File Prior to Visit  Medication Sig Dispense Refill  . dronedarone (MULTAQ) 400 MG tablet Take 1 tablet (400 mg total) by mouth 2 (two) times daily with a meal. 180 tablet 3  . Erenumab-aooe (AIMOVIG) 140 MG/ML SOAJ Inject 140 mg into the skin every 30 (thirty) days. 3 pen 1  . fluticasone (FLONASE) 50 MCG/ACT nasal spray Place 1 spray into both nostrils daily. 16 g 3  . nortriptyline (PAMELOR) 50 MG capsule Take 2 capsules (100 mg total) by mouth at bedtime. 180 capsule 1  . pravastatin (PRAVACHOL) 20 MG tablet Take 1 tablet (20 mg total) by mouth daily. 90 tablet 3  . SUMAtriptan (IMITREX) 6 MG/0.5ML SOLN injection Inject 0.5 mLs (6 mg total) into the  skin 2 (two) times daily as needed for migraine or headache. May repeat in 2 hours if headache persists or recurs. 12 mL 1  . SUMAtriptan 6 MG/0.5ML SOAJ     . topiramate (TOPAMAX) 50 MG tablet Take one tablet in the AM and two tablets in the PM. 270 tablet 3  . triamcinolone cream (KENALOG) 0.1 % Apply 1 application topically 2 (two) times daily. 30 g 0   No current facility-administered medications on file prior to visit.     Past Medical History:  Diagnosis Date  . Arthritis   . Baker's cyst   . Headache(784.0)   . Knee pain   . Low back pain   . Migraines   . OSA (obstructive sleep apnea) 04/10/2017  . Personality disorder ([place])   . Seizures ([place])     Past Surgical History:  Procedure Laterality Date  . TOOTH EXTRACTION  12/2014   lower left    Allergies  Allergen Reactions  . Aspirin     GI upset Other reaction(s): Vomiting    Family History  Problem Relation Age of Onset  .  Heart disease Father        defibrilator  . Hyperlipidemia Father   . Hypertension Father   . Alzheimer's disease Father   . Other Father        stents in legs  . Aneurysm Mother        brain  . Hypertension Brother   . Stroke Brother        X2    Social History   Socioeconomic History  . Marital status: Divorced    Spouse name: Not on file  . Number of children: 0  . Years of education: Not on file  . Highest education level: Not on file  Occupational History  . Occupation: disabled  Tobacco Use  . Smoking status: Former Research scientist (life sciences)  . Smokeless tobacco: Never Used  . Tobacco comment: states he only tried as a teenager  Substance and Sexual Activity  . Alcohol use: No  . Drug use: No  . Sexual activity: Not Currently  Other Topics Concern  . Not on file  Social History Narrative   03/16/18: Lives alone with dog, Glenard Haring in apartment. Wife of 16 years left him about 2 years ago, and pt. has had difficulty adjusting.   Says his brother Francee Piccolo, who lives in [place], is his main  contact, source of support, as well as his dog.    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 States he uses painting as additional therapy.    Social Determinants of Health   Financial Resource Strain:   . Difficulty of Paying Living Expenses: Not on file  Food Insecurity:   . Worried About Charity fundraiser in the Last Year: Not on file  . Ran Out of Food in the Last Year: Not on file  Transportation Needs:   . Lack of Transportation (Medical): Not on file  . Lack of Transportation (Non-Medical): Not on file  Physical Activity:   . Days of Exercise per Week: Not on file  . Minutes of Exercise per Session: Not on file  Stress:   . Feeling of Stress : Not on file  Social Connections:   . Frequency of Communication with Friends and Family: Not on file  . Frequency of Social Gatherings with Friends and Family: Not on file  . Attends Religious Services: Not on file  . Active Member of Clubs or Organizations: Not on file  . Attends Archivist Meetings: Not on file  . Marital Status: Not on file     Vitals:   03/17/19 1354  BP: 130/80  Pulse: 96  Resp: 12  SpO2: 99%   Body mass index is 35.05 kg/m.   Wt Readings from Last 3 Encounters:  03/17/19 273 lb (123.8 kg)  01/27/19 268 lb (121.6 kg)  01/01/19 262 lb 12.8 oz (119.2 kg)    Physical Exam  Nursing note and vitals reviewed. Constitutional: He is oriented to person, place, and time. He appears well-developed. No distress.  HENT:  Head: Normocephalic and atraumatic.  Mouth/Throat: Oropharynx is clear and moist and mucous membranes are normal.  Eyes: Pupils are equal, round, and reactive to light. Conjunctivae are normal.  Cardiovascular: Normal rate. An irregular rhythm present.  No murmur heard. Pulses:      Dorsalis pedis pulses are 2+ on the right side and 2+ on the left side.  Respiratory: Effort normal  and breath sounds normal. No respiratory distress.  GI: Soft. He  exhibits no mass. There is no hepatomegaly. There is no abdominal tenderness.  Musculoskeletal:        General: No edema.  Lymphadenopathy:    He has no cervical adenopathy.  Neurological: He is alert and oriented to person, place, and time. He has normal strength. No cranial nerve deficit. Gait normal.  Skin: Skin is warm. No rash noted. No erythema.  Psychiatric: His mood appears anxious.  Well groomed, good eye contact.   ASSESSMENT AND PLAN:  Mr. Shun was seen today for AWV and follow up.  Diagnoses and all orders for this visit:  Orders Placed This Encounter  Procedures  . PSA   Lab Results  Component Value Date   PSA 0.23 03/17/2019   Medicare annual wellness visit, subsequent We discussed the importance of staying active, physically and mentally, as well as the benefits of a healthy/balance diet. Low impact exercise that involve stretching and strengthing are ideal. Vaccines up to date. We discussed preventive screening for the next 5-10 years, summery of recommendations given in AVS. Colonoscopy die in 2027. Fall prevention.  Advance directives and end of life discussed, he has POA and living will.   Prostate cancer screening -     PSA  Constipation, unspecified constipation type Miralax and Bisacodyl 5 mg daily as needed recommended. Benefiber 1 tsp bid. Adequate fiber and fluid intake. Instructed about warning signs.  Insomnia, unspecified type Good sleep hygiene recommended. He takes Nortriptyline and Topamax at bedtime. Psychiatric etiologies is also to be consider.  Radicular pain of left lower extremity We discussed side effects of chronic use of NSAID's. Benefits vs risk , he would like to try Mobic 15 mg daily as needed. F/U in 3 months,before if needed.  -     meloxicam (MOBIC) 15 MG tablet; Take 1 tablet (15 mg total) by mouth daily.  Chronic fatigue We discussed possible  etiologies. Definitively insomnia and OSA are contributing factors. Encouraged to wear CPAP for at least 6 hours.  Convulsions, unspecified convulsion type ([place]) Problem has been well controlled. Following with Dr Jannifer Franklin.  RLS (restless legs syndrome) Still symptomatic. He agrees with increasing dose of Requip from 0.25 mg to 0.5 mg at bedtime.  -     rOPINIRole (REQUIP) 0.5 MG tablet; Take 1 tablet (0.5 mg total) by mouth at bedtime.   Return in about 3 months (around 06/15/2019) for cpe and f/u.    Blakley Michna G. Martinique, MD  [facility]. [place] office.

## 2019-03-17 NOTE — Patient Instructions (Addendum)
    Steven Vincent , Thank you for taking time to come for your Medicare Wellness Visit. I appreciate your ongoing commitment to your health goals. Please review the following plan we discussed and let me know if I can assist you in the future.   These are the goals we discussed: Goals    . Exercise 150 min/wk Moderate Activity     When you back is resolved, please walk at least 30 minutes x 2 per day     . Patient Stated     Improve sleep with CPAP, secure affordable housing       This is a list of the screening recommended for you and due dates:  Health Maintenance  Topic Date Due  . Tetanus Vaccine  03/30/2020*  . HIV Screening  03/17/2023*  . Colon Cancer Screening  09/20/2025  . Flu Shot  Completed  *Topic was postponed. The date shown is not the original due date.     A few tips:  -As we age balance is not as good as it was, so there is a higher risks for falls. Please remove small rugs and furniture that is "in your way" and could increase the risk of falls. Stretching exercises may help with fall prevention: Yoga and Tai Chi are some examples. Low impact exercise is better, so you are not very achy the next day.  -Sun screen and avoidance of direct sun light recommended. Caution with dehydration, if working outdoors be sure to drink enough fluids.  - Some medications are not safe as we age, increases the risk of side effects and can potentially interact with other medication you are also taken;  including some of over the counter medications. Be sure to let me know when you start a new medication even if it is a dietary/vitamin supplement.   -Healthy diet low in red meet/animal fat and sugar + regular physical activity is recommended.    A few things to remember from today's visit:   Medicare annual wellness visit, subsequent  Prostate cancer screening - Plan: PSA  Constipation, unspecified constipation type  Insomnia, unspecified type  Radicular pain of left  lower extremity - Plan: meloxicam (MOBIC) 15 MG tablet  Chronic fatigue  Requip increased from 0.25 mg 2.5 mg. You can take MiraLAX and bisacodyl over-the-counter daily at night as needed for constipation. Adequate fluid and fiber intake.  Benefiber is high-fiber you can get over-the-counter today 1 teaspoon twice daily. Today we are starting meloxicam 15 mg to take once daily as needed for pain. I will check your kidney function next visit.  Please be sure medication list is accurate. If a new problem present, please set up appointment sooner than planned today.          Screening schedule for the next 5-10 years:  Colonoscopy  Glaucoma screening/eye exam every 1-2 years.  Flu vaccine annually.  Diabetes screening   Fall prevention Abdominal aortic aneurysm screening if you ever smoke.    Advance directives:  Please see a lawyer and/or go to this website to help you with advanced directives and designating a health care power of attorney so that your wishes will be followed should you become too ill to make your own medical decisions.  [email or web address]

## 2019-03-25 DIAGNOSIS — G4733 Obstructive sleep apnea (adult) (pediatric): Secondary | ICD-10-CM | POA: Diagnosis not present

## 2019-03-25 DIAGNOSIS — F2089 Other schizophrenia: Secondary | ICD-10-CM | POA: Diagnosis not present

## 2019-03-26 ENCOUNTER — Telehealth: Payer: Self-pay | Admitting: Family Medicine

## 2019-03-26 NOTE — Telephone Encounter (Signed)
The form should be in your box.

## 2019-03-26 NOTE — Telephone Encounter (Signed)
Adrienne with [facility] is needing clarification on meloxicam (MOBIC) 15 MG. Thanks  Adrienne's # [phone]

## 2019-03-29 ENCOUNTER — Other Ambulatory Visit: Payer: Self-pay | Admitting: Family Medicine

## 2019-03-29 DIAGNOSIS — M5410 Radiculopathy, site unspecified: Secondary | ICD-10-CM

## 2019-03-29 MED ORDER — MELOXICAM 15 MG PO TABS: 15.0000 mg | ORAL_TABLET | Freq: Every day | ORAL | 0 refills | Status: DC

## 2019-03-29 NOTE — Telephone Encounter (Signed)
Pt states he needs his Meloxicam [facility]) refilled Pharmacy: [place], [place] can be reached at [phone]

## 2019-03-29 NOTE — Telephone Encounter (Signed)
Rx done. 

## 2019-03-30 DIAGNOSIS — M159 Polyosteoarthritis, unspecified: Secondary | ICD-10-CM | POA: Diagnosis not present

## 2019-03-30 NOTE — Telephone Encounter (Signed)
The problem seems to be that he is allergic to Aspirin. I believe he has taken OTC ibuprofen or other NSAIDs. Can you please ask Steven Vincent if he is also allergic to OTC NSAIDs.If he is, meloxicam is not an option for pain management. Thanks, BJ

## 2019-03-30 NOTE — Telephone Encounter (Signed)
I called and spoke with pharmacy. This was already taken care of, nothing further needed.

## 2019-04-05 ENCOUNTER — Telehealth: Payer: Self-pay | Admitting: Family Medicine

## 2019-04-05 DIAGNOSIS — M545 Low back pain: Secondary | ICD-10-CM | POA: Diagnosis not present

## 2019-04-05 DIAGNOSIS — M170 Bilateral primary osteoarthritis of knee: Secondary | ICD-10-CM | POA: Diagnosis not present

## 2019-04-05 NOTE — Telephone Encounter (Signed)
Pt is requesting a note for work [employer]. Pt has work limitations because he has had shots in both knees. Per pt, he can't get on his hands and knees and he can't lift anything over 30 lbs. Thanks  Pt's [phone]

## 2019-04-05 NOTE — Telephone Encounter (Signed)
Pt does not see either ortho or pain management currently.

## 2019-04-05 NOTE — Telephone Encounter (Signed)
Okay for note for work ?

## 2019-04-05 NOTE — Telephone Encounter (Signed)
Does he follow with orthopedist or pain management? Thanks, BJ

## 2019-04-05 NOTE — Telephone Encounter (Signed)
He did follow with both previously - but doesn't look like he has seen Ortho recently.

## 2019-04-06 ENCOUNTER — Telehealth: Payer: Self-pay | Admitting: Family Medicine

## 2019-04-06 NOTE — Telephone Encounter (Signed)
Pt wants Dr.Jordan to know that he is going to have a nerve block done next Tuesday.

## 2019-04-06 NOTE — Telephone Encounter (Signed)
fyi

## 2019-04-09 ENCOUNTER — Telehealth: Payer: Self-pay | Admitting: Family Medicine

## 2019-04-09 ENCOUNTER — Encounter: Payer: Self-pay | Admitting: Family Medicine

## 2019-04-09 NOTE — Telephone Encounter (Signed)
See other phone note

## 2019-04-09 NOTE — Telephone Encounter (Signed)
Letter completed. I left pt a voicemail letting him know it is up front for pick up.

## 2019-04-09 NOTE — Telephone Encounter (Signed)
Pt came to the office and requested a message be sent to you. Pt called on 02/09 and doesn't know why it's taking so long for a call back. I explained that Dr. Martinique and you weren't in yesterday and that sometimes it can take 2-3 business days to hear back. Thanks   Pt's # [phone]

## 2019-04-12 DIAGNOSIS — M47816 Spondylosis without myelopathy or radiculopathy, lumbar region: Secondary | ICD-10-CM | POA: Diagnosis not present

## 2019-04-16 DIAGNOSIS — F819 Developmental disorder of scholastic skills, unspecified: Secondary | ICD-10-CM | POA: Diagnosis not present

## 2019-04-16 DIAGNOSIS — F2089 Other schizophrenia: Secondary | ICD-10-CM | POA: Diagnosis not present

## 2019-04-18 NOTE — Progress Notes (Signed)
Cardiology Office Note Date:  04/19/2019  Patient ID:  Steven, Vincent 1966/02/11, MRN [id number] PCP:  Martinique, Betty G, MD  Cardiologist:  Dr. Percival Spanish Electrophysiologist: Dr. Curt Bears     Chief Complaint: 6 mo follow up  History of Present Illness:  Steven Vincent is a 54 y.o. male with history of OSA( reports compliance with CPAP), seizure d/o, PVCs.  Historically during a sleep study reportedly had bradycardia 40's resulting in reduction of his BB.  Dec 2019 Subsequently saw Dr. Percival Spanish, felt likely this was 2/2 undercounting his PVCs There was some question of syncope, though He did not think so and suspected his OSA as culprit for this. He discussed he had a negative POET (Plain Old Exercise Treadmill) .   He had a Holter with PVCs with bigeminy, trigeminy and a triplet.   An echo was   He has had subsequent visits with reports of "going out" also mentions not using CPAP, tired all of the time falls asleep easily.  Again mention of bradycardia, though to 30's that resulted in discontinuation of his BB, though again dr. Percival Spanish felt was undercount of the PVCs He has had as well c/o CP with a negative stress myoview Planned for repeat monitoring >> showing again frequent PVCs with a burden of 26%. 4 beat run NSVT. Minimum HR 58  April 2020 he saw Dr. Martinique he mentions feels tired a lot, dizzy, and does have pressure in his chest. States he gets pale when active. Sweats like crazy and also gets SOB. Chest pressure is usually with activity. Felt dizzy just getting off elevator today. Feels drunk a lot. He notes these symptoms have been going on for 2 years. He has some stressors including going through a divorce, his father dying, and his service dog was attacked. He does have a learning disability. At that time, in curbside d/w EP, started on Multaq for PVCs referred to Dr. Curt Bears  He had tele-health visit may 2020, noted concerns of medication interactions with 1c  agents.  The pt mentioned feeling better on the Multaq.   His PVCs were multifocal (making ablation less desirable strategy), planned for an in clinic visit in 3 mo w/EKG He saw him Aug 2020, was feeling well, EKG without PVCs, no changes were, planned for 6 mo visit  He denies symptoms of palpitations.  Mentions that when he takes his nighttime pills sometimes get a central CP, is sharp, non radiating not associated with SOV oro there symptoms.  Sometimes he drinks milk this does not seem to help.  Exertion or position changes does not change it either, it just eventually is gone. He works at [employer], is on his feet and moving is physcial work, this does not provoke this CP or any CP. He does say that some days though he is unusually tired, these days says he barely makes it through the day and ends up falling asleep in his chair once home. A couple times since his last visit he has had to sit down at work feeling a little lightheaded.  No syncope. No SOB or DOE.  He is very rpoud of his life's accomplishments, discusses them and all the life's trials that he has had to overcome. 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  He mentions his years in [place] and how much he enjoyed it  Past Medical History:  Diagnosis Date  . Arthritis   . Baker's cyst   .  Headache(784.0)   . Knee pain   . Low back pain   . Migraines   . OSA (obstructive sleep apnea) 04/10/2017  . Personality disorder ([place])   . Seizures ([place])     Past Surgical History:  Procedure Laterality Date  . TOOTH EXTRACTION  12/2014   lower left    Current Outpatient Medications  Medication Sig Dispense Refill  . dronedarone (MULTAQ) 400 MG tablet Take 1 tablet (400 mg total) by mouth 2 (two) times daily with a meal. 180 tablet 3  . Erenumab-aooe (AIMOVIG) 140 MG/ML SOAJ Inject 140 mg into the skin every 30 (thirty) days. 3 pen 1  . fluticasone (FLONASE) 50 MCG/ACT nasal spray Place 1 spray into both  nostrils daily. 16 g 3  . meloxicam (MOBIC) 15 MG tablet Take 1 tablet (15 mg total) by mouth daily. 90 tablet 0  . pravastatin (PRAVACHOL) 20 MG tablet Take 1 tablet (20 mg total) by mouth daily. 90 tablet 3  . rOPINIRole (REQUIP) 0.25 MG tablet 0.25 mg daily.    . SUMAtriptan (IMITREX) 6 MG/0.5ML SOLN injection Inject 0.5 mLs (6 mg total) into the skin 2 (two) times daily as needed for migraine or headache. May repeat in 2 hours if headache persists or recurs. 12 mL 1  . topiramate (TOPAMAX) 50 MG tablet Take one tablet in the AM and two tablets in the PM. 270 tablet 3  . triamcinolone cream (KENALOG) 0.1 % Apply 1 application topically 2 (two) times daily. 30 g 0   No current facility-administered medications for this visit.    Allergies:   Aspirin   Social History:  The patient  reports that he has quit smoking. He has never used smokeless tobacco. He reports that he does not drink alcohol or use drugs.   Family History:  The patient's family history includes Alzheimer's disease in his father; Aneurysm in his mother; Heart disease in his father; Hyperlipidemia in his father; Hypertension in his brother and father; Other in his father; Stroke in his brother.  ROS:  Please see the history of present illness.  All other systems are reviewed and otherwise negative.   PHYSICAL EXAM:  VS:  BP (!) 142/72   Pulse 80   Ht 6\' 2"  (1.88 m)   Wt 279 lb (126.6 kg)   SpO2 96%   BMI 35.82 kg/m  BMI: Body mass index is 35.82 kg/m. Well nourished, well developed, in no acute distress  HEENT: normocephalic, atraumatic  Neck: no JVD, carotid bruits or masses Cardiac:  RRR, with prolonged auscultation, I hear only a couple extrasystoles,; no significant murmurs, no rubs, or gallops Lungs:  CTA b/l, no wheezing, rhonchi or rales  Abd: soft, nontender, obese MS: no deformity or atrophy Ext: no edema  Skin: warm and dry, no rash Neuro:  No gross deficits appreciated Psych: euthymic mood, full  affect   EKG:  Done today and reviewed by myself: SR 75, no ST/T changes, He has PVCs towards the end of the tracing    Dec 2019, 48hr monitor NSR Frequent ventricular ectopy with isolated PVCs, bigeminy, trigeminy, ventricular couplets and triplets.  One runs of NSVT 4 beats   05/28/2017 stress myoview  Nuclear stress EF: 56%. No wall motion abnormalities  There was no ST segment deviation noted during stress.  Defect 1: There is a small defect of mild severity present in the apex location. No ischemia identified.  This is a low risk study. Apical defect likely secondary to tissue  attenuation artifact.   01/09/2017: TTE Study Conclusions   - Left ventricle: The cavity size was normal. Wall thickness was  increased in a pattern of moderate LVH. Systolic function was  normal. The estimated ejection fraction was in the range of 55%  to 60%. Wall motion was normal; there were no regional wall  motion abnormalities. Left ventricular diastolic function  parameters were normal.  - Mitral valve: There was trivial regurgitation.    Oct 2018: 24hr monitor NSR, PVCs ventrilcuar bigeminy.   Ventricular trigeminy. NSVT with one triplet.      Recent Labs: 05/04/2018: BUN 21; Creatinine, Ser 1.00; Hemoglobin 13.9; Platelets 161; Potassium 3.5; Sodium 139 07/07/2018: ALT 25  07/07/2018: Cholesterol 124; Direct LDL 70.0; HDL 28.90; Total CHOL/HDL Ratio 4; Triglycerides 228.0; VLDL 45.6   CrCl cannot be calculated (Patient's most recent lab result is older than the maximum 21 days allowed.).   Wt Readings from Last 3 Encounters:  04/19/19 279 lb (126.6 kg)  03/17/19 273 lb (123.8 kg)  01/27/19 268 lb (121.6 kg)     Other studies reviewed: Additional studies/records reviewed today include: summarized above  ASSESSMENT AND PLAN:  1. PVCs     On Multaq       2. CP     atyical     Negative stress test above   3. Fatigue, weak spells     Unclear etiology      Seems fairly random       Update his echo and monitor to re-assess PVC burden BMET and mag today   Disposition: F/u with [place] in 74mo, sooner if needed, pending test results    Current medicines are reviewed at length with the patient today.  The patient did not have any concerns regarding medicines.  Venetia Night, PA-C 04/19/2019 2:57 PM     [address] [place] [address] [phone] (office)  [phone] (fax)

## 2019-04-19 ENCOUNTER — Other Ambulatory Visit (INDEPENDENT_AMBULATORY_CARE_PROVIDER_SITE_OTHER): Payer: Medicare HMO

## 2019-04-19 ENCOUNTER — Ambulatory Visit (INDEPENDENT_AMBULATORY_CARE_PROVIDER_SITE_OTHER): Payer: Medicare HMO | Admitting: Physician Assistant

## 2019-04-19 ENCOUNTER — Telehealth: Payer: Self-pay | Admitting: *Deleted

## 2019-04-19 ENCOUNTER — Other Ambulatory Visit: Payer: Self-pay

## 2019-04-19 VITALS — BP 142/72 | HR 80 | Ht 74.0 in | Wt 279.0 lb

## 2019-04-19 DIAGNOSIS — I493 Ventricular premature depolarization: Secondary | ICD-10-CM

## 2019-04-19 DIAGNOSIS — M159 Polyosteoarthritis, unspecified: Secondary | ICD-10-CM | POA: Insufficient documentation

## 2019-04-19 DIAGNOSIS — R5383 Other fatigue: Secondary | ICD-10-CM | POA: Diagnosis not present

## 2019-04-19 DIAGNOSIS — R079 Chest pain, unspecified: Secondary | ICD-10-CM

## 2019-04-19 MED ORDER — DRONEDARONE HCL 400 MG PO TABS: 400.0000 mg | ORAL_TABLET | Freq: Two times a day (BID) | ORAL | 3 refills | Status: DC

## 2019-04-19 NOTE — Telephone Encounter (Signed)
Patient enrolled for [employer] to mail a 3 day ZIO XT long term holter monitor to his home.  Instructions reviewed briefly as they are included in the monitor kit. 

## 2019-04-19 NOTE — Addendum Note (Signed)
Addended by: Claude Manges on: 04/19/2019 04:08 PM   Modules accepted: Orders

## 2019-04-19 NOTE — Patient Instructions (Addendum)
Medication Instructions:   Your physician recommends that you continue on your current medications as directed. Please refer to the Current Medication list given to you today.  *If you need a refill on your cardiac medications before your next appointment, please call your pharmacy*  Lab Work:  BMET AND [employer]    If you have labs (blood work) drawn today and your tests are completely normal, you will receive your results only by: Marland Kitchen MyChart Message (if you have MyChart) OR . A paper copy in the mail If you have any lab test that is abnormal or we need to change your treatment, we will call you to review the results.  Testing/Procedures: Your physician has requested that you have an echocardiogram. Echocardiography is a painless test that uses sound waves to create images of your heart. It provides your doctor with information about the size and shape of your heart and how well your heart's chambers and valves are working. This procedure takes approximately one hour. There are no restrictions for this procedure.  Follow-Up: At [facility], you and your health needs are our priority.  As part of our continuing mission to provide you with exceptional heart care, we have created designated Provider Care Teams.  These Care Teams include your primary Cardiologist (physician) and Advanced Practice Providers (APPs -  Physician Assistants and Nurse Practitioners) who all work together to provide you with the care you need, when you need it.  Your next appointment:   6 month(s)  The format for your next appointment:   In Person  Provider:   You may see Will Meredith Leeds, MD or one of the following Advanced Practice Providers on your designated Care Team:    Chanetta Marshall, NP  Tommye Standard, [place]  Legrand Como "Jonni Sanger" Boykin, [place]   Other Instructions [place] Monitor Instructions   Your physician has requested you wear your ZIO patch monitor__14_____days.   This is a single patch  monitor.  [employer] supplies one patch monitor per enrollment.  Additional stickers are not available.   Please do not apply patch if you will be having a Nuclear Stress Test, Echocardiogram, Cardiac CT, MRI, or Chest Xray during the time frame you would be wearing the monitor. The patch cannot be worn during these tests.  You cannot remove and re-apply the ZIO XT patch monitor.   Your ZIO patch monitor will be sent USPS Priority mail from [facility] directly to your home address. The monitor may also be mailed to a PO BOX if home delivery is not available.   It may take 3-5 days to receive your monitor after you have been enrolled.   Once you have received you monitor, please review enclosed instructions.  Your monitor has already been registered assigning a specific monitor serial # to you.   Applying the monitor   Shave hair from upper left chest.   Hold abrader disc by orange tab.  Rub abrader in 40 strokes over left upper chest as indicated in your monitor instructions.   Clean area with 4 enclosed alcohol pads .  Use all pads to assure are is cleaned thoroughly.  Let dry.   Apply patch as indicated in monitor instructions.  Patch will be place under collarbone on left side of chest with arrow pointing upward.   Rub patch adhesive wings for 2 minutes.Remove white label marked "1".  Remove white label marked "2".  Rub patch adhesive wings for 2 additional minutes.   While  looking in a mirror, press and release button in center of patch.  A small green light will flash 3-4 times .  This will be your only indicator the monitor has been turned on.     Do not shower for the first 24 hours.  You may shower after the first 24 hours.   Press button if you feel a symptom. You will hear a small click.  Record Date, Time and Symptom in the Patient Log Book.   When you are ready to remove patch, follow instructions on last 2 pages of Patient Log Book.  Stick patch monitor onto last page of  Patient Log Book.   Place Patient Log Book in [place] box.  Use locking tab on box and tape box closed securely.  The [employer] and [employer] has [employer] on it.  Please place in mailbox as soon as possible.  Your physician should have your test results approximately 7 days after the monitor has been mailed back to [facility].   Call [facility] at [phone] if you have questions regarding your ZIO XT patch monitor.  Call them immediately if you see an orange light blinking on your monitor.   If your monitor falls off in less than 4 days contact our Monitor department at [phone].  If your monitor becomes loose or falls off after 4 days call [employer] at [phone] for suggestions on securing your monitor.

## 2019-04-20 LAB — BASIC METABOLIC PANEL
BUN/Creatinine Ratio: 17 (ref 9–20)
BUN: 18 mg/dL (ref 6–24)
CO2: 23 mmol/L (ref 20–29)
Calcium: 9.4 mg/dL (ref 8.7–10.2)
Chloride: 104 mmol/L (ref 96–106)
Creatinine, Ser: 1.08 mg/dL (ref 0.76–1.27)
GFR calc Af Amer: 90 mL/min/{1.73_m2} (ref >59)
GFR calc non Af Amer: 78 mL/min/{1.73_m2} (ref >59)
Glucose: 82 mg/dL (ref 65–99)
Potassium: 4.2 mmol/L (ref 3.5–5.2)
Sodium: 141 mmol/L (ref 134–144)

## 2019-04-20 LAB — MAGNESIUM: Magnesium: 2 mg/dL (ref 1.6–2.3)

## 2019-04-21 DIAGNOSIS — M47816 Spondylosis without myelopathy or radiculopathy, lumbar region: Secondary | ICD-10-CM | POA: Diagnosis not present

## 2019-04-23 DIAGNOSIS — Z23 Encounter for immunization: Secondary | ICD-10-CM | POA: Diagnosis not present

## 2019-04-23 DIAGNOSIS — S81802A Unspecified open wound, left lower leg, initial encounter: Secondary | ICD-10-CM | POA: Diagnosis not present

## 2019-04-23 DIAGNOSIS — M79605 Pain in left leg: Secondary | ICD-10-CM | POA: Diagnosis not present

## 2019-04-23 DIAGNOSIS — S80812A Abrasion, left lower leg, initial encounter: Secondary | ICD-10-CM | POA: Diagnosis not present

## 2019-04-25 DIAGNOSIS — G4733 Obstructive sleep apnea (adult) (pediatric): Secondary | ICD-10-CM | POA: Diagnosis not present

## 2019-04-27 DIAGNOSIS — G4733 Obstructive sleep apnea (adult) (pediatric): Secondary | ICD-10-CM | POA: Diagnosis not present

## 2019-04-27 DIAGNOSIS — M159 Polyosteoarthritis, unspecified: Secondary | ICD-10-CM | POA: Diagnosis not present

## 2019-04-28 ENCOUNTER — Ambulatory Visit (HOSPITAL_COMMUNITY): Payer: Medicare HMO | Attending: Cardiovascular Disease

## 2019-04-28 ENCOUNTER — Other Ambulatory Visit: Payer: Self-pay

## 2019-04-28 DIAGNOSIS — R079 Chest pain, unspecified: Secondary | ICD-10-CM | POA: Diagnosis not present

## 2019-04-29 DIAGNOSIS — S80812D Abrasion, left lower leg, subsequent encounter: Secondary | ICD-10-CM | POA: Diagnosis not present

## 2019-04-29 DIAGNOSIS — L03116 Cellulitis of left lower limb: Secondary | ICD-10-CM | POA: Diagnosis not present

## 2019-04-29 DIAGNOSIS — S81802D Unspecified open wound, left lower leg, subsequent encounter: Secondary | ICD-10-CM | POA: Diagnosis not present

## 2019-04-29 DIAGNOSIS — M79605 Pain in left leg: Secondary | ICD-10-CM | POA: Diagnosis not present

## 2019-04-30 DIAGNOSIS — R079 Chest pain, unspecified: Secondary | ICD-10-CM | POA: Diagnosis not present

## 2019-04-30 DIAGNOSIS — R42 Dizziness and giddiness: Secondary | ICD-10-CM | POA: Diagnosis not present

## 2019-04-30 DIAGNOSIS — I493 Ventricular premature depolarization: Secondary | ICD-10-CM | POA: Diagnosis not present

## 2019-05-03 ENCOUNTER — Telehealth: Payer: Self-pay | Admitting: Cardiology

## 2019-05-03 NOTE — Telephone Encounter (Signed)
  Patient would like someone to call him to go over his last lab results and he was following up on his monitor results also.

## 2019-05-05 ENCOUNTER — Telehealth: Payer: Self-pay | Admitting: *Deleted

## 2019-05-05 DIAGNOSIS — M79605 Pain in left leg: Secondary | ICD-10-CM | POA: Diagnosis not present

## 2019-05-05 DIAGNOSIS — S81802D Unspecified open wound, left lower leg, subsequent encounter: Secondary | ICD-10-CM | POA: Diagnosis not present

## 2019-05-05 DIAGNOSIS — S80812D Abrasion, left lower leg, subsequent encounter: Secondary | ICD-10-CM | POA: Diagnosis not present

## 2019-05-05 DIAGNOSIS — L03116 Cellulitis of left lower limb: Secondary | ICD-10-CM | POA: Diagnosis not present

## 2019-05-05 NOTE — Telephone Encounter (Signed)
lmtcb

## 2019-05-05 NOTE — Telephone Encounter (Signed)
-----   Message from Will Meredith Leeds, MD sent at 05/04/2019  2:07 PM EST ----- PVC burden remains low. Continue monitoring.

## 2019-05-06 NOTE — Telephone Encounter (Signed)
Spoke with patient and aware of past labs work  results and echo results with verbalizing understanding. Patient was told Nurse Sherrie will get back touch with monitor results.

## 2019-05-06 NOTE — Telephone Encounter (Signed)
Patient states he is returning call. 

## 2019-05-12 ENCOUNTER — Telehealth: Payer: Self-pay | Admitting: *Deleted

## 2019-05-12 DIAGNOSIS — S80812D Abrasion, left lower leg, subsequent encounter: Secondary | ICD-10-CM | POA: Diagnosis not present

## 2019-05-12 DIAGNOSIS — M79605 Pain in left leg: Secondary | ICD-10-CM | POA: Diagnosis not present

## 2019-05-12 DIAGNOSIS — S81802D Unspecified open wound, left lower leg, subsequent encounter: Secondary | ICD-10-CM | POA: Diagnosis not present

## 2019-05-12 DIAGNOSIS — L03116 Cellulitis of left lower limb: Secondary | ICD-10-CM | POA: Diagnosis not present

## 2019-05-12 NOTE — Telephone Encounter (Signed)
lmtcb to discuss monitor results    Claude Manges, CMA at 05/06/2019 4:36 PM  Status: Signed    Spoke with patient and aware of past labs work  results and echo results with verbalizing understanding. Patient was told Nurse Sherrie will get back touch with monitor results.

## 2019-05-19 NOTE — Telephone Encounter (Signed)
lmtcb

## 2019-05-21 DIAGNOSIS — F819 Developmental disorder of scholastic skills, unspecified: Secondary | ICD-10-CM | POA: Diagnosis not present

## 2019-05-23 DIAGNOSIS — G4733 Obstructive sleep apnea (adult) (pediatric): Secondary | ICD-10-CM | POA: Diagnosis not present

## 2019-05-24 NOTE — Telephone Encounter (Signed)
lmtcb

## 2019-05-24 NOTE — Telephone Encounter (Signed)
Follow Up:      Returning your call from today. 

## 2019-05-25 NOTE — Telephone Encounter (Signed)
Patient called again. Stated he has to go to work soon.

## 2019-05-25 NOTE — Telephone Encounter (Signed)
Patient calling back for results

## 2019-05-26 NOTE — Telephone Encounter (Signed)
Left detailed message informing pt of call (left details d/t the "phone tag" b/t pt and myself). Informed of monitor result, continue monitoring, follow up in August. Recall placed in EPIC Asked pt to call office back if he would like to discuss this further.

## 2019-05-26 NOTE — Telephone Encounter (Signed)
Patient returning Sherri's call. He states that he has been really tired lately. He only works 5 hours and by 2:00pm he is done for the day. He states if he sits down he falls asleep. He denies any other symptoms (dizziness, headaches, sob, chest pain). BP 133/86 and HR 70. States that he does take his medication everyday.

## 2019-05-26 NOTE — Telephone Encounter (Signed)
Reports still experiencing a lot of fatigue.  States that he is exhausted when he gets home from work. He does report that he has been having trouble sleeping lately and not getting much sleep d/t wrong mattress being sent to him (special mattress for his back).  He is due to receive the correct mattress this weekend. He will await the new mattress and see how things go after receiving.  If after week/two he is still having fatigue, and getting better sleep, he will call back to discuss fatigue further.

## 2019-06-01 ENCOUNTER — Telehealth: Payer: Self-pay | Admitting: Family Medicine

## 2019-06-01 NOTE — Telephone Encounter (Signed)
Pt states because he has a heart issue (skips a beat--his heart doctor is looking into this) is he ok to receive the COVID vaccine?  Pt is a lil afraid to get the vaccine due to the issues he is currently having with his heart. He states that on the paperwork he gotten it from the COVID vaccine sites it ask if he has any heart conditions and if so to check with PCP.   Pt can be reached at [phone] --may leave a detailed message per pt

## 2019-06-01 NOTE — Telephone Encounter (Signed)
Please advise 

## 2019-06-04 NOTE — Telephone Encounter (Signed)
I do not see contraindications for COVID-19 vaccine. He also follows with cardiologist, who can also advise if he has more questions or concerns in this regard. Thanks, BJ

## 2019-06-04 NOTE — Telephone Encounter (Signed)
Left detailed message on home number informing patient that per Dr. Martinique: I do not see contraindications for COVID-19 vaccine. He also follows with cardiologist, who can also advise if he has more questions or concerns in this regard. Thanks, BJ

## 2019-06-04 NOTE — Telephone Encounter (Signed)
Patient called today to follow up on his question about the shot because he hasn't heard from anyone.   He said that he will be at work till 2 PM today but we can leave a full message on his home answering machine.  Please advise

## 2019-06-11 ENCOUNTER — Other Ambulatory Visit: Payer: Self-pay | Admitting: Family Medicine

## 2019-06-11 ENCOUNTER — Other Ambulatory Visit: Payer: Self-pay | Admitting: Neurology

## 2019-06-11 DIAGNOSIS — M5410 Radiculopathy, site unspecified: Secondary | ICD-10-CM

## 2019-06-14 ENCOUNTER — Encounter: Payer: Self-pay | Admitting: Family Medicine

## 2019-06-14 ENCOUNTER — Ambulatory Visit (INDEPENDENT_AMBULATORY_CARE_PROVIDER_SITE_OTHER): Payer: Medicare HMO | Admitting: Family Medicine

## 2019-06-14 ENCOUNTER — Other Ambulatory Visit: Payer: Self-pay

## 2019-06-14 VITALS — BP 130/80 | HR 82 | Temp 97.1°F | Resp 12 | Ht 74.0 in | Wt 272.0 lb

## 2019-06-14 DIAGNOSIS — J309 Allergic rhinitis, unspecified: Secondary | ICD-10-CM

## 2019-06-14 DIAGNOSIS — E6609 Other obesity due to excess calories: Secondary | ICD-10-CM | POA: Diagnosis not present

## 2019-06-14 DIAGNOSIS — Z6834 Body mass index (BMI) 34.0-34.9, adult: Secondary | ICD-10-CM

## 2019-06-14 DIAGNOSIS — R030 Elevated blood-pressure reading, without diagnosis of hypertension: Secondary | ICD-10-CM | POA: Diagnosis not present

## 2019-06-14 DIAGNOSIS — M5410 Radiculopathy, site unspecified: Secondary | ICD-10-CM

## 2019-06-14 DIAGNOSIS — R6889 Other general symptoms and signs: Secondary | ICD-10-CM

## 2019-06-14 NOTE — Progress Notes (Signed)
ACUTE VISIT Chief Complaint  Patient presents with  . Memory Loss    sometimes gets lost while driving  . Hypertension    elevated blood pressure started Friday after taking Mucinex D for sinuses   HPI: StevenSteven Vincent is a 54 y.o. male, who is here today with a few concerns. These are not new problems. C/O "memory loss", forgetfulness.  Problem has been going on for a while and stable. Sometimes he gets loss when driving. He has mentioned to his neurologist. Hx of migraines.  -Trying to lose wt. Decreased portion size.  He does not exercise regularly due to pain but stays active.  -Back pain "is not getting any better." It is radiated to LLE.  He follows with orthopedist. Mobic 15 mg helps some. [place] earlier this month due to LLE radicular pain.  States that his ortho ordered a brace for leg. States that he cannot wear brace because he does not have shoe size 16. He went to see podiatrist because his feet never sweat and foot pain.  -Reporting elevated BP's:108/120 and 108/89.  Attributed to OTC decongestant. Negative for CP,SOB,or diaphoresis.  A few days ago he had a "bad headache" and diarrhea x 4 in 24 hours. Resolved. Denies associated fever,chills,sore throat,anosmia,ageustia,wheezing, or cough. + Nasal congestion,rhinorrhea,post nasal drainage,epiphora.  He states that he has not had his COVID 19 because vaccine "speeds your heart up." According to pt, he discussed this issue with his cardiologist.  Review of Systems  Constitutional: Positive for fatigue. Negative for activity change, appetite change and unexpected weight change.  HENT: Negative for mouth sores and nosebleeds.   Eyes: Positive for itching. Negative for redness and visual disturbance.  Cardiovascular: Negative for leg swelling.  Gastrointestinal: Negative for abdominal pain, nausea and vomiting.  Genitourinary: Negative for decreased urine volume, dysuria and  hematuria.  Musculoskeletal: Positive for back pain.  Skin: Negative for rash.  Neurological: Negative for seizures, syncope, facial asymmetry and weakness.  Rest see pertinent positives and negatives per HPI.  Current Outpatient Medications on File Prior to Visit  Medication Sig Dispense Refill  . diclofenac (FLECTOR) 1.3 % PTCH     . dronedarone (MULTAQ) 400 MG tablet Take 1 tablet (400 mg total) by mouth 2 (two) times daily with a meal. 180 tablet 3  . Erenumab-aooe (AIMOVIG) 140 MG/ML SOAJ Inject 140 mg into the skin every 30 (thirty) days. 3 pen 1  . fluticasone (FLONASE) 50 MCG/ACT nasal spray Place 1 spray into both nostrils daily. 16 g 3  . meloxicam (MOBIC) 15 MG tablet Take 1 tablet (15 mg total) by mouth daily. 90 tablet 0  . nortriptyline (PAMELOR) 50 MG capsule TAKE 2 CAPSULES AT BEDTIME 180 capsule 0  . pravastatin (PRAVACHOL) 20 MG tablet Take 1 tablet (20 mg total) by mouth daily. 90 tablet 3  . rOPINIRole (REQUIP) 0.25 MG tablet 0.25 mg daily.    . SUMAtriptan (IMITREX) 6 MG/0.5ML SOLN injection Inject 0.5 mLs (6 mg total) into the skin 2 (two) times daily as needed for migraine or headache. May repeat in 2 hours if headache persists or recurs. 12 mL 1  . topiramate (TOPAMAX) 50 MG tablet Take one tablet in the AM and two tablets in the PM. 270 tablet 3  . triamcinolone cream (KENALOG) 0.1 % Apply 1 application topically 2 (two) times daily. 30 g 0   No current facility-administered medications on file prior to visit.   Past  Medical History:  Diagnosis Date  . Arthritis   . Baker's cyst   . Headache(784.0)   . Knee pain   . Low back pain   . Migraines   . OSA (obstructive sleep apnea) 04/10/2017  . Personality disorder ([place])   . Seizures ([facility])    Allergies  Allergen Reactions  . Delsym [Dextromethorphan] Other (See Comments)    Gets loopy/memory loss  . Aspirin     GI upset Other reaction(s): Vomiting  . Mucinex D [Pseudoephedrine-Guaifenesin Er] Hypertension      Social History   Socioeconomic History  . Marital status: Divorced    Spouse name: Not on file  . Number of children: 0  . Years of education: Not on file  . Highest education level: Not on file  Occupational History  . Occupation: disabled  Tobacco Use  . Smoking status: Former Research scientist (life sciences)  . Smokeless tobacco: Never Used  . Tobacco comment: states he only tried as a teenager  Substance and Sexual Activity  . Alcohol use: No  . Drug use: No  . Sexual activity: Not Currently  Other Topics Concern  . Not on file  Social History Narrative   03/16/18: Lives alone with dog, Glenard Haring in apartment. Wife of 16 years left him about 2 years ago, and pt. has had difficulty adjusting.   Says his brother Francee Piccolo, who lives in [place], is his main contact, source of support, as well as his dog.    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 States he uses painting as additional therapy.    Social Determinants of Health   Financial Resource Strain:   . Difficulty of Paying Living Expenses:   Food Insecurity:   . Worried About Charity fundraiser in the Last Year:   . Arboriculturist in the Last Year:   Transportation Needs:   . Film/video editor (Medical):   Marland Kitchen Lack of Transportation (Non-Medical):   Physical Activity:   . Days of Exercise per Week:   . Minutes of Exercise per Session:   Stress:   . Feeling of Stress :   Social Connections:   . Frequency of Communication with Friends and Family:   . Frequency of Social Gatherings with Friends and Family:   . Attends Religious Services:   . Active Member of Clubs or Organizations:   . Attends Archivist Meetings:   Marland Kitchen Marital Status:    Vitals:   06/14/19 1437  BP: 130/80  Pulse: 82  Resp: 12  Temp: (!) 97.1 F (36.2 C)  SpO2: 98%   Wt Readings from Last 3 Encounters:  06/14/19 272 lb (123.4 kg)  04/19/19 279 lb (126.6 kg)   03/17/19 273 lb (123.8 kg)    Body mass index is 34.92 kg/m.  Physical Exam  Nursing note and vitals reviewed. Constitutional: He is oriented to person, place, and time. He appears well-developed. No distress.  HENT:  Head: Normocephalic and atraumatic.  Mouth/Throat: Oropharynx is clear and moist and mucous membranes are normal.  Hyperemic nasal mucosa.  Eyes: Pupils are equal, round, and reactive to light. Conjunctivae are normal.  Cardiovascular: Normal rate and regular rhythm.  No murmur heard. Respiratory: Effort normal and breath sounds normal. No respiratory distress.  GI: Soft. He exhibits no mass. There is no hepatomegaly. There is no abdominal tenderness.  Musculoskeletal:        General: No edema.  Lumbar back: No tenderness or bony tenderness.  Lymphadenopathy:    He has no cervical adenopathy.  Neurological: He is alert and oriented to person, place, and time. He has normal strength. No cranial nerve deficit. Gait normal.  Skin: Skin is warm. No rash noted. No erythema.  Psychiatric: He has a normal mood and affect.  Well groomed, good eye contact.    ASSESSMENT AND PLAN:  StevenClanton was seen today for memory loss and hypertension.  Diagnoses and all orders for this visit:  Forgetfulness We discussed possible etiologies. Recommend cognitive challenging games. Follows with neurologist.  Class 1 obesity due to excess calories with serious comorbidity and body mass index (BMI) of 34.0 to 34.9 in adult We discussed benefits of wt loss as well as adverse effects of obesity. Wt gain can aggravate back pain. Consistency with healthy diet and low impact physical activity recommended.  Radicular pain of left lower extremity Fall precautions. Follows with ortho.  Elevated blood pressure reading BP today adequate. Continue monitoring BP regularly. Low salt diet. Some side effects of cold meds/dicongestant,recommedn avoiding them.  Allergic rhinitis Some  side effects of decongestants discussed. Nasal irrigations with saline as needed. Flonase nasal daily as needed.    Return if symptoms worsen or fail to improve, for Keep next appt..   Seydou Hearns G. Martinique, MD  [facility]. [place] office.

## 2019-06-14 NOTE — Assessment & Plan Note (Signed)
Some side effects of decongestants discussed. Nasal irrigations with saline as needed. Flonase nasal daily as needed.

## 2019-06-14 NOTE — Patient Instructions (Addendum)
A few things to remember from today's visit:   Allergic rhinitis, unspecified seasonality, unspecified trigger  Cold medications can increase your blood pressure, so avoid. Nasal irrigation with saline as needed. Continue Flonase nasal spray. You can take over-the-counter antihistaminic.  Continue monitoring blood pressure.  Continue working on wt loss, 1800 cal/day. Low salt diet. Consider taking COVID 19 vaccine.  Please be sure medication list is accurate. If a new problem present, please set up appointment sooner than planned today.

## 2019-06-15 DIAGNOSIS — F819 Developmental disorder of scholastic skills, unspecified: Secondary | ICD-10-CM | POA: Diagnosis not present

## 2019-06-23 DIAGNOSIS — G4733 Obstructive sleep apnea (adult) (pediatric): Secondary | ICD-10-CM | POA: Diagnosis not present

## 2019-06-28 ENCOUNTER — Encounter: Payer: Self-pay | Admitting: Family Medicine

## 2019-06-28 ENCOUNTER — Ambulatory Visit (INDEPENDENT_AMBULATORY_CARE_PROVIDER_SITE_OTHER): Payer: Medicare HMO | Admitting: Family Medicine

## 2019-06-28 ENCOUNTER — Other Ambulatory Visit: Payer: Self-pay

## 2019-06-28 VITALS — BP 140/80 | HR 89 | Temp 97.1°F | Resp 16 | Ht 74.0 in | Wt 275.1 lb

## 2019-06-28 DIAGNOSIS — I493 Ventricular premature depolarization: Secondary | ICD-10-CM | POA: Diagnosis not present

## 2019-06-28 DIAGNOSIS — G2581 Restless legs syndrome: Secondary | ICD-10-CM | POA: Diagnosis not present

## 2019-06-28 DIAGNOSIS — F21 Schizotypal disorder: Secondary | ICD-10-CM

## 2019-06-28 DIAGNOSIS — M159 Polyosteoarthritis, unspecified: Secondary | ICD-10-CM | POA: Diagnosis not present

## 2019-06-28 DIAGNOSIS — Z6835 Body mass index (BMI) 35.0-35.9, adult: Secondary | ICD-10-CM | POA: Diagnosis not present

## 2019-06-28 DIAGNOSIS — E782 Mixed hyperlipidemia: Secondary | ICD-10-CM | POA: Diagnosis not present

## 2019-06-28 NOTE — Progress Notes (Signed)
Chief Complaint  Patient presents with  . Need paperwork filled out    moving out of state in 3 weeks and need forms completed   . Follow-up   HPI: [name] is a 54 y.o. male, who is here today for his last follow up before moving out of state. Requesting form to be completed, so he can get affordable housing and to allow him to live with his dog. He is moving to [place] in 3 weeks. He also wants to be sure he has enough medication until he can find a new PCP.  He is excited because he is going to be close to family.  Is going to be working with his brother at [employer].  Allergy rhinitis: He is currently on Flonase nasal spray, 1 spray daily as needed.  Chronic pain/generalized OA: Currently he is on meloxicam 50 mg daily as needed.  Lab Results  Component Value Date   CREATININE 1.08 04/19/2019   BUN 18 04/19/2019   NA 141 04/19/2019   K 4.2 04/19/2019   CL 104 04/19/2019   CO2 23 04/19/2019    RLS: He is on Requip 0.25 mg daily.  He follows with neurologist for migraine headaches and seizure disorder. He is on Imitrex 6 mg / 0.5 mL injection 2 times daily as needed, Aimovig 140 mg q 4 weeks, and nortriptyline 1000 mg at bedtime for migraines. He is also on Topamax 50 mg a.m. and 2 tablets at p.m. Seizures have been well controlled with Topamax. OSA on CPAP.  Hyperlipidemia: He is on pravastatin 20 mg daily. Lab Results  Component Value Date   CHOL 124 07/07/2018   HDL 28.90 (L) 07/07/2018   LDLCALC 74 10/16/2016   LDLDIRECT 70.0 07/07/2018   TRIG 228.0 (H) 07/07/2018   CHOLHDL 4 07/07/2018   PVC, she follows with cardiologist. He is on Multaq 40 mg twice daily. Last visit on 04/30/19.  Schizophrenia and developmental disorder of scholastic skills, unspecified.  He follows with psychiatrist. Last seen by psychotherapist on 04/16/2019, follows every 3-4 weeks. He has a Ship broker", his dog,   She is trying to follow a healthier diet. He is  walking daily about a mile.  Review of Systems  Constitutional: Positive for fatigue. Negative for activity change, appetite change and fever.  HENT: Negative for mouth sores, nosebleeds and sore throat.   Eyes: Negative for redness and visual disturbance.  Respiratory: Negative for cough, shortness of breath and wheezing.   Cardiovascular: Positive for palpitations. Negative for chest pain and leg swelling.  Gastrointestinal: Negative for abdominal pain, nausea and vomiting.  Genitourinary: Negative for decreased urine volume, dysuria and hematuria.  Musculoskeletal: Positive for arthralgias and back pain. Negative for gait problem.  Allergic/Immunologic: Positive for environmental allergies.  Neurological: Negative for syncope and weakness.  Psychiatric/Behavioral: Negative for confusion and hallucinations. The patient is nervous/anxious.   Rest of ROS, see pertinent positives sand negatives in HPI  Current Outpatient Medications on File Prior to Visit  Medication Sig Dispense Refill  . dronedarone (MULTAQ) 400 MG tablet Take 1 tablet (400 mg total) by mouth 2 (two) times daily with a meal. 180 tablet 3  . Erenumab-aooe (AIMOVIG) 140 MG/ML SOAJ Inject 140 mg into the skin every 30 (thirty) days. 3 pen 1  . fluticasone (FLONASE) 50 MCG/ACT nasal spray Place 1 spray into both nostrils daily. 16 g 3  . meloxicam (MOBIC) 15 MG tablet Take 1 tablet (15 mg total) by mouth  daily. If needed for pain. 90 tablet 0  . nortriptyline (PAMELOR) 50 MG capsule TAKE 2 CAPSULES AT BEDTIME 180 capsule 0  . pravastatin (PRAVACHOL) 20 MG tablet Take 1 tablet (20 mg total) by mouth daily. 90 tablet 3  . rOPINIRole (REQUIP) 0.25 MG tablet 0.25 mg daily.    . SUMAtriptan (IMITREX) 6 MG/0.5ML SOLN injection Inject 0.5 mLs (6 mg total) into the skin 2 (two) times daily as needed for migraine or headache. May repeat in 2 hours if headache persists or recurs. 12 mL 1  . topiramate (TOPAMAX) 50 MG tablet Take one  tablet in the AM and two tablets in the PM. 270 tablet 3   No current facility-administered medications on file prior to visit.   Past Medical History:  Diagnosis Date  . Arthritis   . Baker's cyst   . Headache(784.0)   . Knee pain   . Low back pain   . Migraines   . OSA (obstructive sleep apnea) 04/10/2017  . Personality disorder ([place])   . Seizures ([facility])    Allergies  Allergen Reactions  . Delsym [Dextromethorphan] Other (See Comments)    Gets loopy/memory loss  . Aspirin     GI upset Other reaction(s): Vomiting  . Mucinex D [Pseudoephedrine-Guaifenesin Er] Hypertension    Social History   Socioeconomic History  . Marital status: Divorced    Spouse name: Not on file  . Number of children: 0  . Years of education: Not on file  . Highest education level: Not on file  Occupational History  . Occupation: disabled  Tobacco Use  . Smoking status: Former Research scientist (life sciences)  . Smokeless tobacco: Never Used  . Tobacco comment: states he only tried as a teenager  Substance and Sexual Activity  . Alcohol use: No  . Drug use: No  . Sexual activity: Not Currently  Other Topics Concern  . Not on file  Social History Narrative   03/16/18: Lives alone with dog, Glenard Haring in apartment. Wife of 16 years left him about 2 years ago, and pt. has had difficulty adjusting.   Says his brother Francee Piccolo, who lives in [place], is his main contact, source of support, as well as his dog.    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 States he uses painting as additional therapy.    Social Determinants of Health   Financial Resource Strain:   . Difficulty of Paying Living Expenses:   Food Insecurity:   . Worried About Charity fundraiser in the Last Year:   . Arboriculturist in the Last Year:   Transportation Needs:   . Film/video editor (Medical):   Marland Kitchen Lack of Transportation (Non-Medical):   Physical Activity:    . Days of Exercise per Week:   . Minutes of Exercise per Session:   Stress:   . Feeling of Stress :   Social Connections:   . Frequency of Communication with Friends and Family:   . Frequency of Social Gatherings with Friends and Family:   . Attends Religious Services:   . Active Member of Clubs or Organizations:   . Attends Archivist Meetings:   Marland Kitchen Marital Status:     Vitals:   06/28/19 1005  BP: 140/80  Pulse: 89  Resp: 16  Temp: (!) 97.1 F (36.2 C)  SpO2: 96%   Wt Readings from Last 3 Encounters:  06/28/19 275 lb 2 oz (  124.8 kg)  06/14/19 272 lb (123.4 kg)  04/19/19 279 lb (126.6 kg)   Body mass index is 35.32 kg/m.  Physical Exam  Nursing note reviewed. Constitutional: He is oriented to person, place, and time. He appears well-developed. No distress.  HENT:  Head: Normocephalic and atraumatic.  Eyes: Pupils are equal, round, and reactive to light. Conjunctivae are normal.  Cardiovascular: Normal rate and regular rhythm.  No murmur heard. Respiratory: Effort normal and breath sounds normal. No respiratory distress.  GI: Soft. He exhibits no mass. There is no hepatomegaly. There is no abdominal tenderness.  Musculoskeletal:        General: No edema.  Lymphadenopathy:    He has no cervical adenopathy.  Neurological: He is alert and oriented to person, place, and time. He has normal strength. No cranial nerve deficit. Gait normal.  Skin: Skin is warm. No rash noted. No erythema.  Psychiatric: His mood appears anxious.  Well groomed, good eye contact.   ASSESSMENT AND PLAN:  [name] was seen today for his last follow-up.  Diagnoses and all orders for this visit:  Class 2 severe obesity due to excess calories with serious comorbidity and body mass index (BMI) of 35.0 to 35.9 in adult [facility]) We discussed benefits of wt loss as well as adverse effects of obesity. Consistency with healthy diet and physical activity recommended.  Mixed  hyperlipidemia Continue pravastatin 20 mg daily. He is due for lipid panel check in 08/2019.  PVC (premature ventricular contraction) Problem is well controlled. No changes in current management. Instructed to call his cardiologist if he needs refill on Multaq.  RLS (restless legs syndrome) Ropinirole 0.25 mg daily is helping. No changes in current management.  SCHIZOTYPAL PERSONALITY DISORDER Strongly recommend establishing with psychiatrist and psychotherapist in [place], he needs to start looking for a provider before leaving [place].  Forms completed and signed.It will be faxed as requested.   Generalized osteoarthritis of multiple sites We had discussed side effects of chronic NSAIDs. Meloxicam 15 mg daily as needed seems to be helping. Last refill sent on 06/18/2019, 55-month supply.  For some of his medications he will need to contact prescribes for refills.But it seems like he has enough to last until he establish with new providers.  Return in about 4 months (around 10/26/2019) for with new PCP in [place]..  Kelyn Ponciano G. Martinique, MD  [facility]. [place] office.

## 2019-06-28 NOTE — Patient Instructions (Signed)
A few things to remember from today's visit:  Forms completed today, we will fax it. No changes in your current medications. Start looking for new PCP. Good luck in [place], we will miss you!!   If you need refills please call your pharmacy. Do not use My Chart to request refills or for acute issues that need immediate attention.    Please be sure medication list is accurate. If a new problem present, please set up appointment sooner than planned today.

## 2019-06-30 ENCOUNTER — Encounter: Payer: Self-pay | Admitting: Family Medicine

## 2019-07-01 ENCOUNTER — Telehealth: Payer: Self-pay | Admitting: Family Medicine

## 2019-07-01 NOTE — Telephone Encounter (Signed)
Paperwork did not go through was suppose to send a fax to  [place] [facility]  please fax paper work to [employer]  [phone] .

## 2019-07-02 NOTE — Telephone Encounter (Signed)
Form faxed to [phone] as requested with confirmation of receipt.

## 2019-07-05 ENCOUNTER — Telehealth: Payer: Self-pay | Admitting: Family Medicine

## 2019-07-05 NOTE — Telephone Encounter (Signed)
Mary from [employer] stated that Dr. Martinique filled out a form for the pt to get a service animal and there is disparities on the form and she is calling for clarification.   Stanton Kidney can be reached at [phone] ext [phone]

## 2019-07-05 NOTE — Telephone Encounter (Signed)
Returned call to [facility] and informed her that I had spoken with Manuela Schwartz earlier this morning and went over the corrections that needed to be made. Informed Stanton Kidney that form was corrected and tried faxing back to the number Manuela Schwartz had given me, but it wouldn't go through. Form faxed to [facility] at [phone].

## 2019-07-14 ENCOUNTER — Encounter: Payer: Self-pay | Admitting: Adult Health

## 2019-07-14 ENCOUNTER — Ambulatory Visit: Payer: Medicare HMO | Admitting: Neurology

## 2019-07-14 ENCOUNTER — Other Ambulatory Visit: Payer: Self-pay

## 2019-07-14 ENCOUNTER — Ambulatory Visit (INDEPENDENT_AMBULATORY_CARE_PROVIDER_SITE_OTHER): Payer: Medicare HMO | Admitting: Adult Health

## 2019-07-14 VITALS — BP 122/83 | HR 86 | Ht 74.0 in | Wt 274.0 lb

## 2019-07-14 DIAGNOSIS — G43009 Migraine without aura, not intractable, without status migrainosus: Secondary | ICD-10-CM

## 2019-07-14 DIAGNOSIS — G40909 Epilepsy, unspecified, not intractable, without status epilepticus: Secondary | ICD-10-CM | POA: Diagnosis not present

## 2019-07-14 DIAGNOSIS — F819 Developmental disorder of scholastic skills, unspecified: Secondary | ICD-10-CM | POA: Diagnosis not present

## 2019-07-14 MED ORDER — AIMOVIG 140 MG/ML ~~LOC~~ SOAJ: 140.0000 mg | SUBCUTANEOUS | 2 refills | Status: DC

## 2019-07-14 MED ORDER — TOPIRAMATE 50 MG PO TABS: ORAL_TABLET | ORAL | 3 refills | Status: DC

## 2019-07-14 MED ORDER — NORTRIPTYLINE HCL 50 MG PO CAPS: 100.0000 mg | ORAL_CAPSULE | Freq: Every day | ORAL | 2 refills | Status: DC

## 2019-07-14 MED ORDER — SUMATRIPTAN SUCCINATE 6 MG/0.5ML ~~LOC~~ SOLN: 6.0000 mg | Freq: Two times a day (BID) | SUBCUTANEOUS | 1 refills | Status: DC | PRN

## 2019-07-14 NOTE — Patient Instructions (Addendum)
Your Plan:  Continue current treatment plan  Refills will be placed as needed    Follow up as needed as you moving and establishing care with providers in [place]        Thank you for coming to see [place] at [facility]. I hope we have been able to provide you high quality care today.  You may receive a patient satisfaction survey over the next few weeks. We would appreciate your feedback and comments so that we may continue to improve ourselves and the health of our patients.

## 2019-07-14 NOTE — Progress Notes (Signed)
PATIENT: Steven Vincent DOB: 1966/01/05  REASON FOR VISIT: follow up HISTORY FROM: patient  Chief complaint: Chief Complaint  Patient presents with  . Follow-up    Tx rm. Pt here for a f/u on migraines. Pt is alone with no new sx      HPI  Today, 07/14/2019, [name] returns for follow-up regarding migraine headache and seizures.  Migraine headaches have been well controlled with use of Aimovig and nortriptyline.  Imitrex injection for emergent use as needed. He reports last migraine in April but otherwise has not had one in a while.  Denies recurrent seizure activity with ongoing use of Topamax. He does report moving to [place] at the end of the month. He does plan on establishing care with PCP and neurology in [place]. No concerns at this time.       History provided for reference purposes only Update 01/27/2019 SS: [name] is a 54 year old male with history of migraine headache and seizures.  His seizures have been well controlled with Topamax.  He has sleep apnea and uses CPAP.  He remains on nortriptyline and Aimovig for migraines.  He uses Imitrex injection if needed.  He has not had recurrent seizure.  He indicates he is sleeping better with nortriptyline 100 mg at bedtime, but it does take about an hour for the medication to work.  Since starting Aimovig, he has had a 75% improvement in his headaches.  He is now only having 1 headache a month.  He will take an Imitrex injection with headache relief.  He says he follows with pain management for chronic back pain.  He has patches on hand to use if needed for pain.  He says he is no longer able to get epidural injections, because his lower lumbar spine is deteriorating.  He is working at [employer].  He tells me about his 40 years of involvement in [employer].  He indicates he is tolerating his medications without side effect.  He presents today for follow-up unaccompanied.  HISTORY 07/28/2018 Dr. Jannifer Franklin: Mr.  [name] is a 54 year old right-handed white male with a history of migraine headache and seizures.  The patient has been on Topamax with good control of his seizures, his migraine headaches have unfortunately increased in frequency recently.  He has gone from having an average about 1 headache a week to about 3 a week.  He takes nortriptyline 75 mg at night.  He notes that weather changes have a big impact on his headache frequency.  He will take Maxalt if needed for the headache, but he oftentimes has to take 2 a day when the headache comes on.  In the past, he has gotten much better benefit from the injectable Imitrex.  He has sleep apnea, he recently was able to get back on his CPAP.  He returns this office for an evaluation.  He has not had any recurrent seizures.   REVIEW OF SYSTEMS: Out of a complete 14 system review of symptoms, the patient complains only of the following symptoms, and all other reviewed systems are negative.  Seizures, headache  ALLERGIES: Allergies  Allergen Reactions  . Delsym [Dextromethorphan] Other (See Comments)    Gets loopy/memory loss  . Aspirin     GI upset Other reaction(s): Vomiting  . Mucinex D [Pseudoephedrine-Guaifenesin Er] Hypertension    HOME MEDICATIONS: Outpatient Medications Prior to Visit  Medication Sig Dispense Refill  . dronedarone (MULTAQ) 400 MG tablet Take 1 tablet (400 mg  total) by mouth 2 (two) times daily with a meal. 180 tablet 3  . meloxicam (MOBIC) 15 MG tablet Take 1 tablet (15 mg total) by mouth daily. If needed for pain. 90 tablet 0  . nortriptyline (PAMELOR) 50 MG capsule TAKE 2 CAPSULES AT BEDTIME 180 capsule 0  . pravastatin (PRAVACHOL) 20 MG tablet Take 1 tablet (20 mg total) by mouth daily. 90 tablet 3  . rOPINIRole (REQUIP) 0.25 MG tablet 0.25 mg daily.    . SUMAtriptan (IMITREX) 6 MG/0.5ML SOLN injection Inject 0.5 mLs (6 mg total) into the skin 2 (two) times daily as needed for migraine or headache. May repeat in 2 hours if  headache persists or recurs. 12 mL 1  . topiramate (TOPAMAX) 50 MG tablet Take one tablet in the AM and two tablets in the PM. 270 tablet 3  . Erenumab-aooe (AIMOVIG) 140 MG/ML SOAJ Inject 140 mg into the skin every 30 (thirty) days. 3 pen 1  . fluticasone (FLONASE) 50 MCG/ACT nasal spray Place 1 spray into both nostrils daily. 16 g 3   No facility-administered medications prior to visit.    PAST MEDICAL HISTORY: Past Medical History:  Diagnosis Date  . Arthritis   . Baker's cyst   . Headache(784.0)   . Knee pain   . Low back pain   . Migraines   . OSA (obstructive sleep apnea) 04/10/2017  . Personality disorder ([place])   . Seizures ([place])     PAST SURGICAL HISTORY: Past Surgical History:  Procedure Laterality Date  . TOOTH EXTRACTION  12/2014   lower left    FAMILY HISTORY: Family History  Problem Relation Age of Onset  . Heart disease Father        defibrilator  . Hyperlipidemia Father   . Hypertension Father   . Alzheimer's disease Father   . Other Father        stents in legs  . Aneurysm Mother        brain  . Hypertension Brother   . Stroke Brother        X2    SOCIAL HISTORY: Social History   Socioeconomic History  . Marital status: Divorced    Spouse name: Not on file  . Number of children: 0  . Years of education: Not on file  . Highest education level: Not on file  Occupational History  . Occupation: disabled  Tobacco Use  . Smoking status: Former Research scientist (life sciences)  . Smokeless tobacco: Never Used  . Tobacco comment: states he only tried as a teenager  Substance and Sexual Activity  . Alcohol use: No  . Drug use: No  . Sexual activity: Not Currently  Other Topics Concern  . Not on file  Social History Narrative   03/16/18: Lives alone with dog, Glenard Haring in apartment. Wife of 16 years left him about 2 years ago, and pt. has had difficulty adjusting.   Says his brother Francee Piccolo, who lives in [place], is his main contact, source of support, as well as his dog.     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 States he uses painting as additional therapy.    Social Determinants of Health   Financial Resource Strain:   . Difficulty of Paying Living Expenses:   Food Insecurity:   . Worried About Charity fundraiser in the Last Year:   . Arboriculturist in the Last Year:   Transportation Needs:   .  Lack of Transportation (Medical):   Marland Kitchen Lack of Transportation (Non-Medical):   Physical Activity:   . Days of Exercise per Week:   . Minutes of Exercise per Session:   Stress:   . Feeling of Stress :   Social Connections:   . Frequency of Communication with Friends and Family:   . Frequency of Social Gatherings with Friends and Family:   . Attends Religious Services:   . Active Member of Clubs or Organizations:   . Attends Archivist Meetings:   Marland Kitchen Marital Status:   Intimate Partner Violence:   . Fear of Current or Ex-Partner:   . Emotionally Abused:   Marland Kitchen Physically Abused:   . Sexually Abused:     PHYSICAL EXAM  Vitals:   07/14/19 0759  BP: 122/83  Pulse: 86  Weight: 274 lb (124.3 kg)  Height: 6\' 2"  (1.88 m)   Body mass index is 35.18 kg/m.  Generalized: Well developed, pleasant middle aged Caucasian male, in no acute distress   Neurological examination  Mentation: Alert oriented to time, place, history taking. Follows all commands speech and language fluent Cranial nerve II-XII: Pupils were equal round reactive to light. Extraocular movements were full, visual field were full on confrontational test. Facial sensation and strength were normal.  Head turning and shoulder shrug  were normal and symmetric. Motor: The motor testing reveals 5 over 5 strength of all 4 extremities. Good symmetric motor tone is noted throughout.  Sensory: Sensory testing is intact to soft touch on all 4 extremities. No evidence of extinction is noted.    Coordination: Cerebellar testing reveals good finger-nose-finger and heel-to-shin bilaterally.  Gait and station: Gait is somewhat wild based, but steady. Reflexes: Deep tendon reflexes are symmetric   DIAGNOSTIC DATA (LABS, IMAGING, TESTING) - I reviewed patient records, labs, notes, testing and imaging myself where available.  Lab Results  Component Value Date   WBC 6.6 05/04/2018   HGB 13.9 05/04/2018   HCT 42.2 05/04/2018   MCV 95.0 05/04/2018   PLT 161 05/04/2018      Component Value Date/Time   NA 141 04/19/2019 1526   K 4.2 04/19/2019 1526   CL 104 04/19/2019 1526   CO2 23 04/19/2019 1526   GLUCOSE 82 04/19/2019 1526   GLUCOSE 98 05/04/2018 1815   BUN 18 04/19/2019 1526   CREATININE 1.08 04/19/2019 1526   CALCIUM 9.4 04/19/2019 1526   PROT 7.3 07/07/2018 0833   PROT 7.0 01/19/2014 1041   ALBUMIN 4.5 07/07/2018 0833   ALBUMIN 4.7 01/19/2014 1041   AST 24 07/07/2018 0833   ALT 25 07/07/2018 0833   ALKPHOS 71 07/07/2018 0833   BILITOT 0.5 07/07/2018 0833   GFRNONAA 78 04/19/2019 1526   GFRAA 90 04/19/2019 1526   Lab Results  Component Value Date   CHOL 124 07/07/2018   HDL 28.90 (L) 07/07/2018   LDLCALC 74 10/16/2016   LDLDIRECT 70.0 07/07/2018   TRIG 228.0 (H) 07/07/2018   CHOLHDL 4 07/07/2018   Lab Results  Component Value Date   HGBA1C 4.9 09/21/2018   Lab Results  Component Value Date   [id number] 10/16/2016   Lab Results  Component Value Date   TSH 2.51 03/17/2017    ASSESSMENT AND PLAN 54 y.o. year old male  has a past medical history of Arthritis, Baker's cyst, Headache(784.0), Knee pain, Low back pain, Migraines, OSA (obstructive sleep apnea) (04/10/2017), Personality disorder ([place]), and Seizures ([place]). here with:  1.  Chronic migraine headache 2.  Seizures, well controlled  He has been doing well since prior visit. Will continue topamax 50mg  AM and 100mg  PM for seizure prevention. Continue Aimovig 140mg  SQ monthly, nortriptlyine 100mg   at bedtime and sumatriptan injection as needed. Refills provided.   He will be moving at the end of this month and plans on establishing care with new neurologist in [place]. He was advised to follow up if needed during the interval time  I spent 20 minutes with the patient. 50% of this time was spent discussing his plan of care.  Frann Rider, AGNP-BC  [facility] [address] [place], [place] [address]  Phone [phone] Fax [phone] Note: This document was prepared with digital dictation and possible smart phrase technology. Any transcriptional errors that result from this process are unintentional.

## 2019-07-15 MED ORDER — SUMATRIPTAN SUCCINATE 6 MG/0.5ML ~~LOC~~ SOAJ: 6.0000 mg | Freq: Every day | SUBCUTANEOUS | 3 refills | Status: DC | PRN

## 2019-07-15 NOTE — Progress Notes (Signed)
I have read the note, and I agree with the clinical assessment and plan.  Charles K Willis   

## 2019-07-15 NOTE — Addendum Note (Signed)
Addended by: Mal Misty on: 07/15/2019 12:34 PM   Modules accepted: Orders

## 2019-07-15 NOTE — Addendum Note (Signed)
Addended by: Mal Misty on: 07/15/2019 04:01 PM   Modules accepted: Orders

## 2019-07-18 NOTE — Progress Notes (Deleted)
Cardiology Office Note   Date:  07/18/2019   ID:  Devender Chillis, DOB March 09, 1965, MRN [id number]  PCP:  Martinique, Betty G, MD  Cardiologist:   Minus Breeding, MD Referring:  ***  No chief complaint on file.     History of Present Illness: Steven Vincent is a 54 y.o. male who presents for follow up of PVCs and bradycardia.   POET (Plain Old Exercise Treadmill)  in January 2018 was normal. Holter in October 2018 showed frequent PVCs with burden 16%. Average HR 66. Slowest HR 40 while asleep. Beta blocker was reduced and eventually stopped. Echo was normal.   In 2019 Lexiscan  Myoview showed a small fixed apical defect and was otherwise normal. He had repeat Holter in December 2019 showing again frequent PVCs with a burden of 26%. 4 beat run NSVT. Minimum HR 58.  He was seen in the ED on March 9 with concern about slow HR, chest pain, and fatigue. Noted to have pulse of 40 by pulse ox reading. Ecg showed frequent PVCs with bigeminy and couplets.   He was eventually treated with Multaq.  He had much improvement in symptoms.    ***  Patient feels tired a lot, dizzy, and does have pressure in his chest. States he gets pale when active. Sweats like crazy and also gets SOB. Chest pressure is usually with activity. Felt dizzy just getting off elevator today. Feels drunk a lot. He notes these symptoms have been going on for 2 years. He has some stressors including going through a divorce, his father dying, and his service dog was attacked. He does have a learning disability.     Past Medical History:  Diagnosis Date  . Arthritis   . Baker's cyst   . Headache(784.0)   . Knee pain   . Low back pain   . Migraines   . OSA (obstructive sleep apnea) 04/10/2017  . Personality disorder ([place])   . Seizures ([place])     Past Surgical History:  Procedure Laterality Date  . TOOTH EXTRACTION  12/2014   lower left     Current Outpatient Medications  Medication Sig Dispense Refill  .  dronedarone (MULTAQ) 400 MG tablet Take 1 tablet (400 mg total) by mouth 2 (two) times daily with a meal. 180 tablet 3  . Erenumab-aooe (AIMOVIG) 140 MG/ML SOAJ Inject 140 mg into the skin every 30 (thirty) days. 3 pen 2  . meloxicam (MOBIC) 15 MG tablet Take 1 tablet (15 mg total) by mouth daily. If needed for pain. 90 tablet 0  . nortriptyline (PAMELOR) 50 MG capsule Take 2 capsules (100 mg total) by mouth at bedtime. 180 capsule 2  . pravastatin (PRAVACHOL) 20 MG tablet Take 1 tablet (20 mg total) by mouth daily. 90 tablet 3  . rOPINIRole (REQUIP) 0.25 MG tablet 0.25 mg daily.    . SUMAtriptan 6 MG/0.5ML SOAJ Inject 6 mg into the skin daily as needed (migraine onset. may repeat x1 after 1 hr if needed). 0.5 mL 3  . topiramate (TOPAMAX) 50 MG tablet Take one tablet in the AM and two tablets in the PM. 270 tablet 3   No current facility-administered medications for this visit.    Allergies:   Delsym [dextromethorphan], Aspirin, and Mucinex d [pseudoephedrine-guaifenesin er]    Social History:  The patient  reports that he has quit smoking. He has never used smokeless tobacco. He reports that he does not drink alcohol or use drugs.  Family History:  The patient's ***family history includes Alzheimer's disease in his father; Aneurysm in his mother; Heart disease in his father; Hyperlipidemia in his father; Hypertension in his brother and father; Other in his father; Stroke in his brother.    ROS:  Please see the history of present illness.   Otherwise, review of systems are positive for {NONE DEFAULTED:18576::"none"}.   All other systems are reviewed and negative.    PHYSICAL EXAM: VS:  There were no vitals taken for this visit. , BMI There is no height or weight on file to calculate BMI. GENERAL:  Well appearing HEENT:  Pupils equal round and reactive, fundi not visualized, oral mucosa unremarkable NECK:  No jugular venous distention, waveform within normal limits, carotid upstroke brisk  and symmetric, no bruits, no thyromegaly LYMPHATICS:  No cervical, inguinal adenopathy LUNGS:  Clear to auscultation bilaterally BACK:  No CVA tenderness CHEST:  Unremarkable HEART:  PMI not displaced or sustained,S1 and S2 within normal limits, no S3, no S4, no clicks, no rubs, *** murmurs ABD:  Flat, positive bowel sounds normal in frequency in pitch, no bruits, no rebound, no guarding, no midline pulsatile mass, no hepatomegaly, no splenomegaly EXT:  2 plus pulses throughout, no edema, no cyanosis no clubbing SKIN:  No rashes no nodules NEURO:  Cranial nerves II through XII grossly intact, motor grossly intact throughout PSYCH:  Cognitively intact, oriented to person place and time    EKG:  EKG {ACTION; IS/IS [id number] ordered today. The ekg ordered today demonstrates ***   Recent Labs: 04/19/2019: BUN 18; Creatinine, Ser 1.08; Magnesium 2.0; Potassium 4.2; Sodium 141    Lipid Panel    Component Value Date/Time   CHOL 124 07/07/2018 0833   TRIG 228.0 (H) 07/07/2018 0833   TRIG 231 (HH) 12/27/2005 1005   HDL 28.90 (L) 07/07/2018 0833   CHOLHDL 4 07/07/2018 0833   VLDL 45.6 (H) 07/07/2018 0833   LDLCALC 74 10/16/2016 0900   LDLDIRECT 70.0 07/07/2018 0833      Wt Readings from Last 3 Encounters:  07/14/19 274 lb (124.3 kg)  06/28/19 275 lb 2 oz (124.8 kg)  06/14/19 272 lb (123.4 kg)      Other studies Reviewed: Additional studies/ records that were reviewed today include: ***. Review of the above records demonstrates:  Please see elsewhere in the note.  ***   ASSESSMENT AND PLAN:  Frequent PVCs:   ***  ow on Multitaq 400 mg twice daily.  He denies any recurrence of palpitations.  EKG reveals normal sinus rhythm.  The patient will continue on Multitak as directed.  No changes in current regimen.  He is to follow-up with [facility] previously scheduled office visit on October 20, 2018.  No further testing anticipated at this time.  Refills provided for Multitaq.  OSA:    He wears CPAP.  *** Continues on CPAP.  However his machine has been malfunctioning and he states he only gets 3 hours of sleep a night.  I have advised him to follow-up with his pulmonologist for need to have machine exchanged or adjusted.  Cardiology will not be managing his CPAP as he already has a provider who is doing so.  Hypercholesterolemia:   ***  Continues on pravastatin 20 mg daily.  Refills provided.  Labs are drawn by his primary care physician.  Schizophrenic personality disorder:   *** Followed by psychiatry.  Patient does have flight of thoughts today.  Defer to psychiatry for ongoing management.  Sleep apnea:  ***  Current medicines are reviewed at length with the patient today.  The patient {ACTIONS; HAS/DOES NOT HAVE:19233} concerns regarding medicines.  The following changes have been made:  {PLAN; NO CHANGE:13088:s}  Labs/ tests ordered today include: *** No orders of the defined types were placed in this encounter.    Disposition:   FU with ***    Signed, Minus Breeding, MD  07/18/2019 6:50 PM    [facility]

## 2019-07-19 ENCOUNTER — Ambulatory Visit: Payer: Medicare HMO | Admitting: Cardiology

## 2019-07-22 DIAGNOSIS — Z7189 Other specified counseling: Secondary | ICD-10-CM | POA: Insufficient documentation

## 2019-07-22 NOTE — Progress Notes (Signed)
Cardiology Office Note   Date:  07/23/2019   ID:  Steven Vincent, DOB Aug 25, 1965, MRN [id number]  PCP:  Martinique, Betty G, MD  Cardiologist:   Minus Breeding, MD   Chief Complaint  Patient presents with  . Palpitations      History of Present Illness: Steven Vincent is a 53 y.o. male who presents for follow up of PVCs and bradycardia.   POET (Plain Old Exercise Treadmill)  in January 2018 was normal. Holter in October 2018 showed frequent PVCs with burden 16%. Average HR 66. Slowest HR 40 while asleep. Beta blocker was reduced and eventually stopped. Echo was normal.   In 2019 Lexiscan  Myoview showed a small fixed apical defect and was otherwise normal. He had repeat Holter in December 2019 showing again frequent PVCs with a burden of 26%. 4 beat run NSVT. Minimum HR 58.  He was seen in the ED on March 9 with concern about slow HR, chest pain, and fatigue. Noted to have pulse of 40 by pulse ox reading. ECG showed frequent PVCs with bigeminy and couplets.   He was eventually treated with Multaq.  He had much improvement in symptoms.    He is really done quite well with the Multaq.  Is not any further tachypalpitations.  He said no presyncope or syncope.  He is going to move to [place] tomorrow and will need to reestablish care there.  He denies any chest discomfort, neck or arm discomfort.  Had no shortness of breath, PND or orthopnea.  Past Medical History:  Diagnosis Date  . Arthritis   . Baker's cyst   . Headache(784.0)   . Knee pain   . Low back pain   . Migraines   . OSA (obstructive sleep apnea) 04/10/2017  . Personality disorder ([place])   . Seizures ([place])     Past Surgical History:  Procedure Laterality Date  . TOOTH EXTRACTION  12/2014   lower left     Current Outpatient Medications  Medication Sig Dispense Refill  . dronedarone (MULTAQ) 400 MG tablet Take 1 tablet (400 mg total) by mouth 2 (two) times daily with a meal. 180 tablet 3  . Erenumab-aooe  (AIMOVIG) 140 MG/ML SOAJ Inject 140 mg into the skin every 30 (thirty) days. 3 pen 2  . meloxicam (MOBIC) 15 MG tablet Take 1 tablet (15 mg total) by mouth daily. If needed for pain. 90 tablet 0  . nortriptyline (PAMELOR) 50 MG capsule Take 2 capsules (100 mg total) by mouth at bedtime. 180 capsule 2  . pravastatin (PRAVACHOL) 20 MG tablet Take 1 tablet (20 mg total) by mouth daily. 90 tablet 3  . rOPINIRole (REQUIP) 0.25 MG tablet 0.25 mg daily.    . SUMAtriptan 6 MG/0.5ML SOAJ Inject 6 mg into the skin daily as needed (migraine onset. may repeat x1 after 1 hr if needed). 0.5 mL 3  . topiramate (TOPAMAX) 50 MG tablet Take one tablet in the AM and two tablets in the PM. 270 tablet 3   No current facility-administered medications for this visit.    Allergies:   Delsym [dextromethorphan], Aspirin, and Mucinex d [pseudoephedrine-guaifenesin er]    ROS:  Please see the history of present illness.   Otherwise, review of systems are positive for left..   All other systems are reviewed and negative.    PHYSICAL EXAM: VS:  BP (!) 142/100   Pulse 70   Temp (!) 96.9 F (36.1 C)   Ht 6'  2" (1.88 m)   Wt 269 lb (122 kg)   SpO2 97%   BMI 34.54 kg/m  , BMI Body mass index is 34.54 kg/m. GENERAL:  Well appearing NECK:  No jugular venous distention, waveform within normal limits, carotid upstroke brisk and symmetric, no bruits, no thyromegaly LUNGS:  Clear to auscultation bilaterally CHEST:  Unremarkable HEART:  PMI not displaced or sustained,S1 and S2 within normal limits, no S3, no S4, no clicks, no rubs, NO murmurs ABD:  Flat, positive bowel sounds normal in frequency in pitch, no bruits, no rebound, no guarding, no midline pulsatile mass, no hepatomegaly, no splenomegaly EXT:  2 plus pulses throughout, no edema, no cyanosis no clubbing   EKG:  EKG is ordered today. The ekg ordered today demonstrates normal sinus rhythm, rate 70, axis within normal limits, intervals within normal limits, no  acute ST-T wave changes.   Recent Labs: 04/19/2019: BUN 18; Creatinine, Ser 1.08; Magnesium 2.0; Potassium 4.2; Sodium 141    Lipid Panel    Component Value Date/Time   CHOL 124 07/07/2018 0833   TRIG 228.0 (H) 07/07/2018 0833   TRIG 231 (HH) 12/27/2005 1005   HDL 28.90 (L) 07/07/2018 0833   CHOLHDL 4 07/07/2018 0833   VLDL 45.6 (H) 07/07/2018 0833   LDLCALC 74 10/16/2016 0900   LDLDIRECT 70.0 07/07/2018 0833      Wt Readings from Last 3 Encounters:  07/23/19 269 lb (122 kg)  07/14/19 274 lb (124.3 kg)  06/28/19 275 lb 2 oz (124.8 kg)      Other studies Reviewed: Additional studies/ records that were reviewed today include: None. Review of the above records demonstrates:     ASSESSMENT AND PLAN:  Frequent PVCs:  These are much improved.  He will continue with his Multaq.  No change in therapy.  He will have to establish with a new cardiologist when he gets to [place].   OSA:   He wears CPAP.  No change in therapy.   Hypercholesterolemia:    LDL was 70, HDL 28.9.  No change in therapy.  Covid education: He does not want to get the vaccine.  We talked about this.  Current medicines are reviewed at length with the patient today.  The patient does not have concerns regarding medicines.  The following changes have been made:  no change  Labs/ tests ordered today include: None  Orders Placed This Encounter  Procedures  . EKG 12-Lead     Disposition:   FU with new cardiologists when he establishes in [place], Minus Breeding, MD  07/23/2019 12:15 PM    [place]

## 2019-07-23 ENCOUNTER — Encounter: Payer: Self-pay | Admitting: Cardiology

## 2019-07-23 ENCOUNTER — Ambulatory Visit (INDEPENDENT_AMBULATORY_CARE_PROVIDER_SITE_OTHER): Payer: Medicare HMO | Admitting: Cardiology

## 2019-07-23 ENCOUNTER — Other Ambulatory Visit: Payer: Self-pay

## 2019-07-23 VITALS — BP 142/100 | HR 70 | Temp 96.9°F | Ht 74.0 in | Wt 269.0 lb

## 2019-07-23 DIAGNOSIS — Z7189 Other specified counseling: Secondary | ICD-10-CM

## 2019-07-23 DIAGNOSIS — E785 Hyperlipidemia, unspecified: Secondary | ICD-10-CM | POA: Diagnosis not present

## 2019-07-23 DIAGNOSIS — G4733 Obstructive sleep apnea (adult) (pediatric): Secondary | ICD-10-CM

## 2019-07-23 DIAGNOSIS — I493 Ventricular premature depolarization: Secondary | ICD-10-CM

## 2019-07-23 MED ORDER — DRONEDARONE HCL 400 MG PO TABS: 400.0000 mg | ORAL_TABLET | Freq: Two times a day (BID) | ORAL | 3 refills | Status: DC

## 2019-07-23 NOTE — Patient Instructions (Signed)
Medication Instructions:  Multaq Refilled *If you need a refill on your cardiac medications before your next appointment, please call your pharmacy*  Lab Work: NONE ORDERED THIS VISIT  Testing/Procedures: NONE ORDERED THIS VISIT

## 2019-07-28 ENCOUNTER — Ambulatory Visit: Payer: Medicare HMO | Admitting: Neurology

## 2019-07-28 DIAGNOSIS — G4733 Obstructive sleep apnea (adult) (pediatric): Secondary | ICD-10-CM | POA: Diagnosis not present

## 2019-08-10 ENCOUNTER — Other Ambulatory Visit: Payer: Self-pay | Admitting: Adult Health

## 2019-08-10 MED ORDER — SUMATRIPTAN SUCCINATE 6 MG/0.5ML ~~LOC~~ SOAJ: 6.0000 mg | Freq: Every day | SUBCUTANEOUS | 3 refills | Status: DC | PRN

## 2019-08-10 MED ORDER — AIMOVIG 140 MG/ML ~~LOC~~ SOAJ: 140.0000 mg | SUBCUTANEOUS | 0 refills | Status: DC

## 2019-08-10 NOTE — Telephone Encounter (Signed)
Refill for aimovig and imitrex IJ sent to [facility] per pt request. Of note, pt is supposed to be moving this month and establishing neurology care in [place].

## 2019-08-10 NOTE — Addendum Note (Signed)
Addended by: Lester Gracey A on: 08/10/2019 11:12 AM   Modules accepted: Orders

## 2019-08-10 NOTE — Telephone Encounter (Signed)
Pt has called for a refill on his Imitrex injection & Erenumab-aooe (AIMOVIG) 140 MG/ML SOAJ to  [facility] [employer]

## 2019-08-12 ENCOUNTER — Telehealth: Payer: Self-pay | Admitting: *Deleted

## 2019-08-12 MED ORDER — AIMOVIG 140 MG/ML ~~LOC~~ SOAJ: 140.0000 mg | SUBCUTANEOUS | 0 refills | Status: DC

## 2019-08-12 MED ORDER — SUMATRIPTAN SUCCINATE 6 MG/0.5ML ~~LOC~~ SOAJ: 6.0000 mg | Freq: Every day | SUBCUTANEOUS | 3 refills | Status: DC | PRN

## 2019-08-12 NOTE — Telephone Encounter (Signed)
Received call from patient stating [facility] never received refills on Aimovig or sumtatriptan.I advised him they can be sent again. Patient verbalized understanding, appreciation.

## 2019-08-17 DIAGNOSIS — Z0000 Encounter for general adult medical examination without abnormal findings: Secondary | ICD-10-CM | POA: Diagnosis not present

## 2019-08-17 DIAGNOSIS — R030 Elevated blood-pressure reading, without diagnosis of hypertension: Secondary | ICD-10-CM | POA: Diagnosis not present

## 2019-08-17 DIAGNOSIS — Z0001 Encounter for general adult medical examination with abnormal findings: Secondary | ICD-10-CM | POA: Diagnosis not present

## 2019-09-02 ENCOUNTER — Telehealth: Payer: Self-pay | Admitting: Cardiology

## 2019-09-02 NOTE — Telephone Encounter (Signed)
Cindy from Dr. Caralyn Guile office is calling requesting the patient's records & states the pt is there now. Their office is needing the initial consult notes, last 2 OV notes, recent lad and EKG results, as well as all cardiac test that have been performed. They fax number to their office is [phone]. Please advise.

## 2019-09-09 ENCOUNTER — Telehealth: Payer: Self-pay | Admitting: Neurology

## 2019-09-09 ENCOUNTER — Telehealth: Payer: Self-pay | Admitting: Cardiology

## 2019-09-09 ENCOUNTER — Telehealth: Payer: Self-pay | Admitting: Family Medicine

## 2019-09-09 NOTE — Telephone Encounter (Signed)
Follow Up  Patient is calling in again to follow up about question this morning. Please give patient a call back to discuss.

## 2019-09-09 NOTE — Telephone Encounter (Signed)
New message    Pt c/o medication issue:  1. Name of Medication: multaq  2. How are you currently taking this medication (dosage and times per day)? 400mg   3. Are you having a reaction (difficulty breathing--STAT)? no  4. What is your medication issue? Pt has moved to [place].  His doctor in [place] said "pt should not be on this medication very long".  Dr Percival Spanish prescribed this medication and he has had no problems.  Patient is calling to see if he should not be taking medication any more.  He has been on it for approx a year

## 2019-09-09 NOTE — Telephone Encounter (Signed)
This drug was started by her electrophysiologist for treatment of his PVCs.  It was meant to be long-term.  He needs to find a cardiologist for years and possibly preferably an electrophysiologist to manage this medication.

## 2019-09-09 NOTE — Telephone Encounter (Signed)
Spoke with pt, aware of dr hochrein's comments. Patient reports he was told by the heart doctor where he is at that he can not take multaq long term. Patient aware that according to dr hochrein he can take it long term. He is asking if his records were forwarded and according to medical records they were faxed Tuesday this week. He will discuss refills with the heart doctor he is seeing.

## 2019-09-09 NOTE — Telephone Encounter (Signed)
Called the patient back. He has a medical DMV form that is needing to be completed for the patient to get his DL in [place]. Patient is on topamax which is being used for treatment of migraine management. Patient denies having seizures and that the last known sz was early 2000's and Dr Jannifer Franklin had taken him off medications for that. Pt [place] 2 [place] away and states he has no way of getting the form to [place]. Advised I would touch base with our medical records personnel to see If she can assist with getting whatever form is needed for [place] to complete and fax to the [place] DMV. Advised I would call the patient back if there was any problems getting this and if it was a problem he would have to make arrangements with getting [place] the form.

## 2019-09-09 NOTE — Telephone Encounter (Signed)
Pt needs a copy of the hospital bed order fax to attn: Tammy [phone]. Pt's bed is going to collections because insurance is no longer paying for his bed. Pt needs a copy of the doctor orders for his hospital bed so it won't go to collections.  Tammy's ph: [phone]

## 2019-09-09 NOTE — Telephone Encounter (Signed)
I called,  he is going to fax a copy of the DMV form to [phone]

## 2019-09-09 NOTE — Telephone Encounter (Signed)
Patient called stating that he has moved to [place] and is having trouble getting his driver's license. He states they will not give him one because they have flagged him due to a certain medication that he is taking. He is asking for a call about this. [phone])

## 2019-09-10 NOTE — Telephone Encounter (Signed)
This patient is being followed with a history of mild mental retardation, seizures, and migraine headache.  His seizures have been quite well controlled on Topamax which is being used both for seizures and for the migraine.

## 2019-09-10 NOTE — Telephone Encounter (Signed)
Form faxed to Tammy at the number provided.

## 2019-09-13 ENCOUNTER — Telehealth: Payer: Self-pay | Admitting: *Deleted

## 2019-09-13 ENCOUNTER — Telehealth: Payer: Self-pay

## 2019-09-13 NOTE — Telephone Encounter (Signed)
Spoke to pt he states he has new ins [employer] and medications have to be called in to them  His ID # [id number] Group number -[place] customer service # [phone]

## 2019-09-13 NOTE — Telephone Encounter (Signed)
Pt called has question about his medication. Please call [phone]

## 2019-09-13 NOTE — Telephone Encounter (Signed)
PA has been faxed to [place] for Aimovig 140mg   Fax confirmation received

## 2019-09-16 ENCOUNTER — Telehealth: Payer: Self-pay | Admitting: *Deleted

## 2019-09-16 NOTE — Telephone Encounter (Signed)
I faxed pt DMV form today to [phone]

## 2019-09-22 ENCOUNTER — Telehealth: Payer: Self-pay | Admitting: *Deleted

## 2019-09-22 NOTE — Telephone Encounter (Signed)
I called pt and he is wanting to know what is on the [facility] form that we filled out for him since he moved from [place] to [place].

## 2019-09-22 NOTE — Telephone Encounter (Signed)
Pt has question about the medication that's on his DMV papers please call. [phone]

## 2019-09-22 NOTE — Telephone Encounter (Signed)
Pt dmv form faxed to dmv review [phone] or [phone]

## 2019-09-23 NOTE — Telephone Encounter (Signed)
I called pt and home NIS, as well has mobile NIS.  I will wait to hear back from him.

## 2019-09-23 NOTE — Telephone Encounter (Signed)
I found # on form [phone].  I LMVM for him that pt wrote medications on the form.  Looking at Dr. Tobey Grim I do not see that asked for name of medication.  Will wait for callback for him

## 2019-09-28 NOTE — Telephone Encounter (Signed)
I called [facility] and they relayed last fill 08-25-19 for topiramate 90 day supply to [place], [place].  They do not have the [place], [place].  I spoke to pt and he will call them.

## 2019-09-30 ENCOUNTER — Other Ambulatory Visit: Payer: Self-pay | Admitting: *Deleted

## 2019-09-30 NOTE — Telephone Encounter (Signed)
Received fax for multiple meds pt wanted refilled. Only topiramate and nortriptyline were [facility] and had refills available.  Tramadol, celecoxib, ropinirole, meloxicam, pravastatin, diclofenac transdermal patch are other providers. I relayed to [place].  Tramadol was not [place]. She is to contact pt for other providers.

## 2019-10-06 ENCOUNTER — Telehealth: Payer: Self-pay | Admitting: Family Medicine

## 2019-10-06 NOTE — Telephone Encounter (Signed)
Rx signed. Rx and OV notes faxed to the number provided. Received a confirmation that the fax went through.

## 2019-10-06 NOTE — Telephone Encounter (Signed)
Rx on pcp desk to be signed.

## 2019-10-06 NOTE — Telephone Encounter (Signed)
Pt and social worker Tammy stated the pt needs a new hospital bed ordered due to him moving to [place] and could not bring his old one with him. He would like his mattress to have a gel overlay instead of the air one they given him last time.   The place the order needs to be sent to is requiring OV notes on why he needs it and pt stated it is due to his back issues.   [facility]  Phone: [phone] Fax:  [phone]  Pt can be reach at Steven Vincent [phone] ext [phone]

## 2019-10-12 ENCOUNTER — Telehealth: Payer: Self-pay | Admitting: Family Medicine

## 2019-10-12 NOTE — Progress Notes (Addendum)
A hospital bed with a gel mattress is medically necessary for patient due to his back pain; he can't sit/lay or reposition himself in a regular bed

## 2019-10-12 NOTE — Telephone Encounter (Signed)
Note corrected and faxed to number provided.

## 2019-10-12 NOTE — Telephone Encounter (Signed)
Lovena Le from [employer] [employer] has said they need an  addemdum that states the necessity of the hospital bed. It has to say if he can't sit/lay or reposition himself in a regular bed. Add information to the notes  Lovena Le [phone]   [phone]

## 2019-10-27 ENCOUNTER — Telehealth: Payer: Self-pay | Admitting: Adult Health

## 2019-10-27 NOTE — Telephone Encounter (Signed)
Noted, I have not received this yet.
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
## 2019-10-28 NOTE — Telephone Encounter (Signed)
I called the patient back for clarification of needs. States the [facility] is requesting his recently completed paperwork be sent to them again, along with his last six months of medical records.
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
## 2019-11-03 NOTE — Telephone Encounter (Signed)
Received DMV p/w at desk this am.  I relayed to pt.  He would like copy once done. I relayed that they are asking about psychiatric report, he states he has seen psychiatry Dr. Casimiro Needle in past.  This may need to be addressed by them if not done by Dr. Jannifer Franklin.   Pt verbalized understanding.

## 2019-11-08 NOTE — Telephone Encounter (Signed)
Pt would like to know the status of the paperwork sent to [facility].  Would like a call from the nurse.

## 2019-11-08 NOTE — Telephone Encounter (Signed)
Spoke to pt He is aware forms are completed  Pt requested copy of forms mailed to him

## 2019-11-09 ENCOUNTER — Telehealth: Payer: Self-pay | Admitting: *Deleted

## 2019-11-09 NOTE — Telephone Encounter (Signed)
R/c DMV form it was faxed to [facility] on 11/09/19.

## 2019-11-29 ENCOUNTER — Telehealth: Payer: Self-pay | Admitting: Pulmonary Disease

## 2019-11-29 NOTE — Telephone Encounter (Signed)
Received records from [facility] forwarded 45 pages to Dr. Elisabeth Cara Sood/[facility] 10/4/21fbg

## 2019-12-01 DIAGNOSIS — Z20822 Contact with and (suspected) exposure to covid-19: Secondary | ICD-10-CM | POA: Diagnosis not present

## 2019-12-01 DIAGNOSIS — Z1152 Encounter for screening for COVID-19: Secondary | ICD-10-CM | POA: Diagnosis not present

## 2019-12-21 ENCOUNTER — Ambulatory Visit (INDEPENDENT_AMBULATORY_CARE_PROVIDER_SITE_OTHER): Payer: Medicare PPO | Admitting: Pulmonary Disease

## 2019-12-21 ENCOUNTER — Other Ambulatory Visit: Payer: Self-pay

## 2019-12-21 ENCOUNTER — Encounter: Payer: Self-pay | Admitting: Pulmonary Disease

## 2019-12-21 VITALS — BP 124/82 | HR 79 | Temp 98.3°F | Ht 74.0 in | Wt 227.4 lb

## 2019-12-21 DIAGNOSIS — G4733 Obstructive sleep apnea (adult) (pediatric): Secondary | ICD-10-CM

## 2019-12-21 NOTE — Progress Notes (Signed)
[facility]  Chief Complaint  Patient presents with  . Follow-up    not using CPAP for more than 2 hours a night    Constitutional:  BP 124/82 (BP Location: Left Arm, Cuff Size: Large)   Pulse 79   Temp 98.3 F (36.8 C) (Temporal)   Ht 6\' 2"  (1.88 m)   Wt 227 lb 6.4 oz (103.1 kg)   SpO2 96%   BMI 29.20 kg/m   Past Medical History:  HA, Low back pain, Migraine HA, Seizures, Personality disorder  Past Surgical History:  His  has a past surgical history that includes Tooth extraction (12/2014).  Brief Summary:  Steven Vincent is a 54 y.o. male former smoker with obstructive sleep apnea.      Subjective:   He moved to [place] temporarily and saw sleep specialist there.  Was told he needed a CPAP titration study to determine if he needs oxygen with CPAP.  He tries using CPAP.  Can fall asleep with CPAP on.  Wakes up after couple hours feeling like he can't breath.  Had home sleep study in May 2020 that showed low oxygen level.  He feels his mask fit is okay.  Physical Exam:   Appearance - well kempt   ENMT - no sinus tenderness, no oral exudate, no LAN, Mallampati 4 airway, no stridor, extensive dental work  Respiratory - equal breath sounds bilaterally, no wheezing or rales  CV - s1s2 regular rate and rhythm, no murmurs  Ext - no clubbing, no edema  Skin - no rashes  Psych - normal mood and affect   Sleep Tests:   PSG 02/15/17 >>AHI 6.9, SpO2 low 85%. Supine AHI 10.5, REM AHI 24.3.  HST 02/26/18 >> AHI 11.6, SpO2 low 79%  HST 07/07/18 >> AHI 7.7, SpO2 low 83%  Auto CPAP 11/20/19 to 12/19/19 >> used on 12 of 30 nights with average 2 hrs 9 min.  Average AHI 4.3 with median CPAP 11 and 95 th percentile CPAP 14 cm H2O  Cardiac Tests:   Echo 04/28/19 >> EF 60 to 65%, grade 1 DD, ascending aorta 40 mm  Social History:  He  reports that he has quit smoking. His smoking use included cigarettes. He has a 1.00  pack-year smoking history. He has never used smokeless tobacco. He reports that he does not drink alcohol and does not use drugs.  Family History:  His family history includes Alzheimer's disease in his father; Aneurysm in his mother; Heart disease in his father; Hyperlipidemia in his father; Hypertension in his brother and father; Other in his father; Stroke in his brother.    Discussion:    Assessment/Plan:   Obstructive sleep apnea. - he is having difficulty using CPAP - will arrange for CPAP titration study to determine whether he needs adjustment to CPAP setting or if he needs to have supplemental oxygen added to CPAP therapy - he uses [facility] for his DME - has been on auto CPAP 5 to 20 cm H2O  Insomnia. - gets nortriptyline from his neurologist  Frequent PVCs. - he has appointment with Dr. Percival Spanish with [facility] cardiology in December 2021 - he has intermittent dyspnea on exertion which I suspect is related to deconditioning; I advised him to d/w PCP or cardiology if this progresses  Time Spent Involved in Patient Care on Day of Examination:  22 minutes  Follow up:  Patient Instructions  Will arrange for CPAP titration study  Follow up  in 6 months   Medication List:   Allergies as of 12/21/2019      Reactions   Delsym [dextromethorphan] Other (See Comments)   Gets loopy/memory loss   Aspirin    GI upset Other reaction(s): Vomiting   Mucinex D [pseudoephedrine-guaifenesin Er] Hypertension      Medication List       Accurate as of December 21, 2019  9:22 AM. If you have any questions, ask your nurse or doctor.        Aimovig 140 MG/ML Soaj Generic drug: Erenumab-aooe Inject 140 mg into the skin every 30 (thirty) days.   dronedarone 400 MG tablet Commonly known as: MULTAQ Take 1 tablet (400 mg total) by mouth 2 (two) times daily with a meal.   meloxicam 15 MG tablet Commonly known as: MOBIC Take 1 tablet (15 mg total) by mouth daily. If needed for  pain.   nortriptyline 50 MG capsule Commonly known as: PAMELOR Take 2 capsules (100 mg total) by mouth at bedtime.   pravastatin 20 MG tablet Commonly known as: PRAVACHOL Take 1 tablet (20 mg total) by mouth daily.   rOPINIRole 0.25 MG tablet Commonly known as: REQUIP 0.25 mg daily.   SUMAtriptan 6 MG/0.5ML Soaj Inject 6 mg into the skin daily as needed (migraine onset. may repeat x1 after 1 hr if needed).   topiramate 50 MG tablet Commonly known as: TOPAMAX Take one tablet in the AM and two tablets in the PM.       Signature:  [name], MD [place] Pager - [phone] 12/21/2019, 9:22 AM

## 2019-12-21 NOTE — Patient Instructions (Signed)
Will arrange for CPAP titration study  Follow up in 6 months 

## 2019-12-29 ENCOUNTER — Ambulatory Visit: Payer: Medicare HMO | Admitting: Adult Health

## 2020-01-05 DIAGNOSIS — F2089 Other schizophrenia: Secondary | ICD-10-CM | POA: Diagnosis not present

## 2020-01-07 ENCOUNTER — Telehealth: Payer: Self-pay | Admitting: Pulmonary Disease

## 2020-01-11 NOTE — Telephone Encounter (Signed)
I called [employer] and spoke to Buckner R who informed me I do not need to do anything with this because [name] is on file not sure who this was that called with this [place]

## 2020-01-13 ENCOUNTER — Other Ambulatory Visit: Payer: Self-pay

## 2020-01-13 ENCOUNTER — Encounter (HOSPITAL_COMMUNITY): Payer: Self-pay

## 2020-01-13 ENCOUNTER — Emergency Department (HOSPITAL_COMMUNITY)
Admission: EM | Admit: 2020-01-13 | Discharge: 2020-01-13 | Disposition: A | Discharge status: Home / Self Care | Payer: Medicare HMO | Attending: Emergency Medicine | Admitting: Emergency Medicine

## 2020-01-13 DIAGNOSIS — R5383 Other fatigue: Secondary | ICD-10-CM | POA: Diagnosis not present

## 2020-01-13 DIAGNOSIS — Z87891 Personal history of nicotine dependence: Secondary | ICD-10-CM | POA: Diagnosis not present

## 2020-01-13 LAB — CBC WITH DIFFERENTIAL/PLATELET
Abs Immature Granulocytes: 0.02 10*3/uL (ref 0.00–0.07)
Basophils Absolute: 0.1 10*3/uL (ref 0.0–0.1)
Basophils Relative: 1 %
Eosinophils Absolute: 0.3 10*3/uL (ref 0.0–0.5)
Eosinophils Relative: 4 %
HCT: 40.4 % (ref 39.0–52.0)
Hemoglobin: 14.4 g/dL (ref 13.0–17.0)
Immature Granulocytes: 0 %
Lymphocytes Relative: 33 %
Lymphs Abs: 2.4 10*3/uL (ref 0.7–4.0)
MCH: 32.6 pg (ref 26.0–34.0)
MCHC: 35.6 g/dL (ref 30.0–36.0)
MCV: 91.4 fL (ref 80.0–100.0)
Monocytes Absolute: 0.6 10*3/uL (ref 0.1–1.0)
Monocytes Relative: 9 %
Neutro Abs: 3.8 10*3/uL (ref 1.7–7.7)
Neutrophils Relative %: 53 %
Platelets: 161 10*3/uL (ref 150–400)
RBC: 4.42 MIL/uL (ref 4.22–5.81)
RDW: 12.3 % (ref 11.5–15.5)
WBC: 7.1 10*3/uL (ref 4.0–10.5)
nRBC: 0 % (ref 0.0–0.2)

## 2020-01-13 LAB — BASIC METABOLIC PANEL
Anion gap: 8 (ref 5–15)
BUN: 19 mg/dL (ref 6–20)
CO2: 21 mmol/L — ABNORMAL LOW (ref 22–32)
Calcium: 8.8 mg/dL — ABNORMAL LOW (ref 8.9–10.3)
Chloride: 111 mmol/L (ref 98–111)
Creatinine, Ser: 0.89 mg/dL (ref 0.61–1.24)
GFR, Estimated: >60 mL/min (ref >60)
Glucose, Bld: 89 mg/dL (ref 70–99)
Potassium: 4 mmol/L (ref 3.5–5.1)
Sodium: 140 mmol/L (ref 135–145)

## 2020-01-13 NOTE — ED Triage Notes (Signed)
Patient states, "I have been tired for 2 days. My heart skips and has been like that for 3 years. I take medicine for it."

## 2020-01-13 NOTE — ED Provider Notes (Signed)
[facility] DEPT Provider Note   CSN: [id number] Arrival date & time: 01/13/20  1350     History Chief Complaint  Patient presents with  . Fatigue  . Irregular heart rate    Steven Vincent is a 54 y.o. male.  54 year old male with prior medical history as detailed below presents for evaluation.  Patient reports fatigue and tiredness.  Symptoms have been ongoing for the last 2 days.  Patient reports the symptoms are worse at work.  Patient without associated chest pain, diaphoresis, nausea, vomiting, or other acute complaint.  Patient reports that he is feeling better now.  Patient requests blood work to "check things out."  Patient does have long standing history of PVCs.  Patient is known to cardiology for same.  He is currently compliant with Multaq as previously prescribed.  The history is provided by the patient and medical records.  Illness Location:  Fatigue, tired Severity:  Mild Onset quality:  Gradual Duration:  2 days Timing:  Intermittent Progression:  Waxing and waning Chronicity:  New Associated symptoms: fatigue   Associated symptoms: no chest pain and no fever        Past Medical History:  Diagnosis Date  . Arthritis   . Baker's cyst   . Headache(784.0)   . Knee pain   . Low back pain   . Migraines   . OSA (obstructive sleep apnea) 04/10/2017  . Personality disorder ([place])   . Seizures [facility])     Patient Active Problem List   Diagnosis Date Noted  . Educated about COVID-19 virus infection 07/22/2019  . Generalized osteoarthritis of multiple sites 04/19/2019  . Fatigue 09/21/2018  . ED (erectile dysfunction) 07/06/2018  . Allergic rhinitis 03/09/2018  . Insomnia 03/06/2018  . DJD (degenerative joint disease), lumbosacral 06/19/2017  . Bradycardia 04/14/2017  . OSA (obstructive sleep apnea) 04/10/2017  . Syncope 01/02/2017  . PVC (premature ventricular contraction) 01/02/2017  . Class 2 obesity with body mass  index (BMI) of 35.0 to 35.9 in adult 11/13/2016  . Chest pain, unspecified 10/16/2016  . Hyperlipidemia 10/16/2016  . Radicular pain of left lower extremity 01/23/2016  . RLS (restless legs syndrome) 01/23/2016  . Hemiballismus 01/17/2015  . Migraine without aura, without mention of intractable migraine without mention of status migrainosus 01/19/2013  . Migraine 08/03/2010  . Hip pain, left 04/26/2010  . Hemorrhoids, internal, with bleeding 04/26/2010  . Daytime hypersomnolence 11/14/2009  . REACTIVE HYPOGLYCEMIA 04/13/2008  . WEIGHT GAIN 04/13/2008  . SCHIZOTYPAL PERSONALITY DISORDER 07/07/2007  . Low back pain with radiation 11/06/2006  . Convulsions ([place]) 11/06/2006  . Headache, unspecified headache type 11/06/2006    Past Surgical History:  Procedure Laterality Date  . TOOTH EXTRACTION  12/2014   lower left       Family History  Problem Relation Age of Onset  . Heart disease Father        defibrilator  . Hyperlipidemia Father   . Hypertension Father   . Alzheimer's disease Father   . Other Father        stents in legs  . Aneurysm Mother        brain  . Hypertension Brother   . Stroke Brother        X2    Social History   Tobacco Use  . Smoking status: Former Smoker    Packs/day: 1.00    Years: 1.00    Pack years: 1.00    Types: Cigarettes  . Smokeless tobacco:  Never Used  . Tobacco comment: states he only tried as a teenager  Vaping Use  . Vaping Use: Never used  Substance Use Topics  . Alcohol use: No  . Drug use: No    Home Medications Prior to Admission medications   Medication Sig Start Date End Date Taking? Authorizing Provider  dronedarone (MULTAQ) 400 MG tablet Take 1 tablet (400 mg total) by mouth 2 (two) times daily with a meal. 07/23/19   Minus Breeding, MD  Erenumab-aooe (AIMOVIG) 140 MG/ML SOAJ Inject 140 mg into the skin every 30 (thirty) days. 08/12/19   Frann Rider, NP  meloxicam (MOBIC) 15 MG tablet Take 1 tablet (15 mg total) by  mouth daily. If needed for pain. 06/18/19   [place], Betty G, MD  nortriptyline (PAMELOR) 50 MG capsule Take 2 capsules (100 mg total) by mouth at bedtime. 07/14/19   Frann Rider, NP  pravastatin (PRAVACHOL) 20 MG tablet Take 1 tablet (20 mg total) by mouth daily. 09/22/18   Minus Breeding, MD  rOPINIRole (REQUIP) 0.25 MG tablet 0.25 mg daily. 04/12/19   [provider]  SUMAtriptan 6 MG/0.5ML SOAJ Inject 6 mg into the skin daily as needed (migraine onset. may repeat x1 after 1 hr if needed). 08/12/19   Frann Rider, NP  topiramate (TOPAMAX) 50 MG tablet Take one tablet in the AM and two tablets in the PM. 07/14/19   Frann Rider, NP    Allergies    Delsym [dextromethorphan], Aspirin, and Mucinex d [pseudoephedrine-guaifenesin er]  Review of Systems   Review of Systems  Constitutional: Positive for fatigue. Negative for fever.  Cardiovascular: Negative for chest pain.  All other systems reviewed and are negative.   Physical Exam Updated Vital Signs BP (!) 152/102 (BP Location: Left Arm)   Pulse 73   Temp 98 F (36.7 C) (Oral)   Resp 17   Ht 6\' 2"  (1.88 m)   Wt 122.5 kg   SpO2 96%   BMI 34.67 kg/m   Physical Exam Vitals and nursing note reviewed.  Constitutional:      General: He is not in acute distress.    Appearance: Normal appearance. He is well-developed.  HENT:     Head: Normocephalic and atraumatic.  Eyes:     Conjunctiva/sclera: Conjunctivae normal.     Pupils: Pupils are equal, round, and reactive to light.  Cardiovascular:     Rate and Rhythm: Normal rate and regular rhythm.     Heart sounds: Normal heart sounds.  Pulmonary:     Effort: Pulmonary effort is normal. No respiratory distress.     Breath sounds: Normal breath sounds.  Abdominal:     General: There is no distension.     Palpations: Abdomen is soft.     Tenderness: There is no abdominal tenderness.  Musculoskeletal:        General: No deformity. Normal range of motion.     Cervical  back: Normal range of motion and neck supple.  Skin:    General: Skin is warm and dry.  Neurological:     General: No focal deficit present.     Mental Status: He is alert and oriented to person, place, and time. Mental status is at baseline.     ED Results / Procedures / Treatments   Labs (all labs ordered are listed, but only abnormal results are displayed) Labs Reviewed  BASIC METABOLIC PANEL - Abnormal; Notable for the following components:      Result Value   CO2  21 (*)    Calcium 8.8 (*)    All other components within normal limits  CBC WITH DIFFERENTIAL/PLATELET    EKG EKG Interpretation  Date/Time:  Thursday January 13 2020 14:12:52 EST Ventricular Rate:  69 PR Interval:    QRS Duration: 107 QT Interval:  420 QTC Calculation: 450 R Axis:   -62 Text Interpretation: Sinus rhythm LAD, consider left anterior fascicular block Confirmed by Dene Gentry [phone]) on 01/13/2020 2:20:00 PM   Radiology No results found.  Procedures Procedures (including critical care time)  Medications Ordered in ED Medications - No data to display  ED Course  I have reviewed the triage vital signs and the nursing notes.  Pertinent labs & imaging results that were available during my care of the patient were reviewed by me and considered in my medical decision making (see chart for details).    MDM Rules/Calculators/A&P                          MDM  Screen complete  Oseas Detty was evaluated in Emergency Department on 01/13/2020 for the symptoms described in the history of present illness. He was evaluated in the context of the global COVID-19 pandemic, which necessitated consideration that the patient might be at risk for infection with the SARS-CoV-2 virus that causes COVID-19. Institutional protocols and algorithms that pertain to the evaluation of patients at risk for COVID-19 are in a state of rapid change based on information released by regulatory bodies including  the CDC and federal and state organizations. These policies and algorithms were followed during the patient's care in the ED.  Patient is presenting for evaluation of reported fatigue.  Patient admits that he is not working harder at his employment.  Screening labs obtained today are without significant abnormality.  EKG is without significant abnormality.  Patient is reassured by work-up today.    Patient is advised to closely follow-up with his already established cardiology care.  Importance of close follow-up is stressed.  Strict return precautions and understood.   Final Clinical Impression(s) / ED Diagnoses Final diagnoses:  Fatigue, unspecified type    Rx / DC Orders ED Discharge Orders    None       Valarie Merino, MD 01/13/20 1534

## 2020-01-13 NOTE — Discharge Instructions (Signed)
Please return for any problem.  °

## 2020-01-17 ENCOUNTER — Other Ambulatory Visit: Payer: Self-pay | Admitting: Adult Health

## 2020-01-17 ENCOUNTER — Other Ambulatory Visit: Payer: Self-pay | Admitting: Family Medicine

## 2020-01-17 DIAGNOSIS — G2581 Restless legs syndrome: Secondary | ICD-10-CM

## 2020-01-24 ENCOUNTER — Telehealth: Payer: Self-pay | Admitting: Cardiology

## 2020-01-24 NOTE — Telephone Encounter (Signed)
Patient states he has been extremely weak and fatigued for the past 3 weeks and it is becoming gradually worse. He would like to know if there is anything he can do to assist with weakness prior to appointment scheduled for 02/01/20. Please advise.

## 2020-01-24 NOTE — Telephone Encounter (Signed)
I spoke with patient.  Was recently seen in ED for fatigue.  He continues to have this same fatigue. I asked him to see primary care for evaluation.  He will follow up with Dr Percival Spanish as scheduled.

## 2020-01-25 ENCOUNTER — Other Ambulatory Visit: Payer: Self-pay

## 2020-01-25 ENCOUNTER — Encounter: Payer: Self-pay | Admitting: Family Medicine

## 2020-01-25 ENCOUNTER — Ambulatory Visit (INDEPENDENT_AMBULATORY_CARE_PROVIDER_SITE_OTHER): Payer: Medicare HMO | Admitting: Family Medicine

## 2020-01-25 VITALS — BP 130/70 | HR 84 | Temp 98.5°F | Resp 16 | Ht 74.4 in | Wt 269.4 lb

## 2020-01-25 DIAGNOSIS — G2581 Restless legs syndrome: Secondary | ICD-10-CM

## 2020-01-25 DIAGNOSIS — M5410 Radiculopathy, site unspecified: Secondary | ICD-10-CM

## 2020-01-25 DIAGNOSIS — Z6835 Body mass index (BMI) 35.0-35.9, adult: Secondary | ICD-10-CM

## 2020-01-25 DIAGNOSIS — M159 Polyosteoarthritis, unspecified: Secondary | ICD-10-CM

## 2020-01-25 DIAGNOSIS — G4700 Insomnia, unspecified: Secondary | ICD-10-CM | POA: Diagnosis not present

## 2020-01-25 DIAGNOSIS — R5382 Chronic fatigue, unspecified: Secondary | ICD-10-CM

## 2020-01-25 MED ORDER — ROPINIROLE HCL 0.25 MG PO TABS: 0.2500 mg | ORAL_TABLET | Freq: Every day | ORAL | 1 refills | Status: DC

## 2020-01-25 MED ORDER — MELOXICAM 15 MG PO TABS: 15.0000 mg | ORAL_TABLET | Freq: Every day | ORAL | 0 refills | Status: DC

## 2020-01-25 NOTE — Progress Notes (Signed)
Chief Complaint  Patient presents with  . Fatigue   HPI: [name] is a 54 y.o. male, who is here today with above concern. He is back from [place], has been in town since 10/2019.  He was in the ER on 01/13/20 c/o fatigue.  Problem has been going on for a while,years. Hx of chronic pain. Back pain interferes with sleep.  Today he is c/o generalized joint pain,hx of OA. Severe joint pain. Hips and knee pain mainly. He has gained some wt. He does not exercise regularly due to pain.  He has not had knee injections in a while, which helped.  He is established with ortho. According to pt, surgical procedure is not an option because he is high risk. He takes Meloxicam 15 mg daily as needed and it helps.  Pending sleep studies. Sleeping about 3-4 hours. He is not tolerating CPAP mask well, it is causing nose bleed and cannot breath. He follows with Dr Steven Vincent.  C/O "sensitive" scalp,no skin lesions. Intermittent, not sure about aggravating or alleviating factors. Hx migraine headache.He is on Aimovig and Nortriptyline. Forget medications frequently. He is interested in [employer] system , medication dispenser.   He follows with neurologist.  Schizophrenia and developmental scholastic skill disorder. Follows with psychiatrist He is planning on seeing Dr Steven Needle, his psychiatrist. He needs a Dx for disability, "electodisability." 01/28/20 and 03/27/2020.  Needs refills for Requip 0.25 mg , which he takes daily for RLS.  Review of Systems  Constitutional: Negative for activity change, appetite change, fever and unexpected weight change.  Respiratory: Negative for cough, shortness of breath and wheezing.   Cardiovascular: Negative for chest pain and leg swelling.  Gastrointestinal: Negative for abdominal pain, nausea and vomiting.  Allergic/Immunologic: Positive for environmental allergies.  Neurological: Negative for seizures and weakness.  Rest see pertinent positives and  negatives per HPI.  Current Outpatient Medications on File Prior to Visit  Medication Sig Dispense Refill  . dronedarone (MULTAQ) 400 MG tablet Take 1 tablet (400 mg total) by mouth 2 (two) times daily with a meal. 180 tablet 3  . Erenumab-aooe (AIMOVIG) 140 MG/ML SOAJ Inject 140 mg into the skin every 30 (thirty) days. 3 pen 0  . nortriptyline (PAMELOR) 50 MG capsule Take 2 capsules (100 mg total) by mouth at bedtime. 180 capsule 2  . pravastatin (PRAVACHOL) 20 MG tablet Take 1 tablet (20 mg total) by mouth daily. 90 tablet 3  . SUMAtriptan 6 MG/0.5ML SOAJ INJECT 6 MG INTO THE SKIN DAILY AS NEEDED AT MIGRAINE ONSET. MAY REPEAT 1 TIME AFTER 1 HOUR IF NEEDED 0.5 mL 0  . topiramate (TOPAMAX) 50 MG tablet Take one tablet in the AM and two tablets in the PM. 270 tablet 3   No current facility-administered medications on file prior to visit.   Past Medical History:  Diagnosis Date  . Arthritis   . Baker's cyst   . Headache(784.0)   . Knee pain   . Low back pain   . Migraines   . OSA (obstructive sleep apnea) 04/10/2017  . Personality disorder ([place])   . Seizures ([facility])    Allergies  Allergen Reactions  . Delsym [Dextromethorphan] Other (See Comments)    Gets loopy/memory loss  . Aspirin     GI upset Other reaction(s): Vomiting  . Mucinex D [Pseudoephedrine-Guaifenesin Er] Hypertension    Social History   Socioeconomic History  . Marital status: Divorced    Spouse name: Not on file  . Number  of children: 0  . Years of education: Not on file  . Highest education level: Not on file  Occupational History  . Occupation: disabled  Tobacco Use  . Smoking status: Former Smoker    Packs/day: 1.00    Years: 1.00    Pack years: 1.00    Types: Cigarettes  . Smokeless tobacco: Never Used  . Tobacco comment: states he only tried as a teenager  Vaping Use  . Vaping Use: Never used  Substance and Sexual Activity  . Alcohol use: No  . Drug use: No  . Sexual activity: Not Currently    Other Topics Concern  . Not on file  Social History Narrative   03/16/18: Lives alone with dog, Steven Vincent in apartment. Wife of 16 years left him about 2 years ago, and pt. has had difficulty adjusting.   Says his brother Steven Vincent, who lives in [place], is his main contact, source of support, as well as his dog.    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 States he uses painting as additional therapy.    Social Determinants of Health   Financial Resource Strain:   . Difficulty of Paying Living Expenses: Not on file  Food Insecurity:   . Worried About Charity fundraiser in the Last Year: Not on file  . Ran Out of Food in the Last Year: Not on file  Transportation Needs:   . Lack of Transportation (Medical): Not on file  . Lack of Transportation (Non-Medical): Not on file  Physical Activity:   . Days of Exercise per Week: Not on file  . Minutes of Exercise per Session: Not on file  Stress:   . Feeling of Stress : Not on file  Social Connections:   . Frequency of Communication with Friends and Family: Not on file  . Frequency of Social Gatherings with Friends and Family: Not on file  . Attends Religious Services: Not on file  . Active Member of Clubs or Organizations: Not on file  . Attends Archivist Meetings: Not on file  . Marital Status: Not on file    Vitals:   01/25/20 0941  BP: 130/70  Pulse: 84  Resp: 16  Temp: 98.5 F (36.9 C)  SpO2: 98%   Body mass index is 34.22 kg/m.  Physical Exam Vitals and nursing note reviewed.  Constitutional:      General: He is not in acute distress.    Appearance: He is well-developed.  HENT:     Head: Normocephalic and atraumatic.  Eyes:     Conjunctiva/sclera: Conjunctivae normal.     Pupils: Pupils are equal, round, and reactive to light.  Cardiovascular:     Rate and Rhythm: Normal rate. Rhythm irregular.     Heart sounds: No  murmur heard.   Pulmonary:     Effort: Pulmonary effort is normal. No respiratory distress.     Breath sounds: Normal breath sounds.  Abdominal:     Palpations: Abdomen is soft. There is no hepatomegaly or mass.     Tenderness: There is no abdominal tenderness.  Lymphadenopathy:     Cervical: No cervical adenopathy.  Skin:    General: Skin is warm.     Findings: No erythema or rash.  Neurological:     General: No focal deficit present.     Mental Status: He is alert and oriented to person, place, and time.  Cranial Nerves: No cranial nerve deficit.     Gait: Gait normal.  Psychiatric:        Mood and Affect: Mood is anxious.    ASSESSMENT AND PLAN:  Mr.Shandell was seen today for fatigue.  Diagnoses and all orders for this visit:  Orders Placed This Encounter  Procedures  . TSH   Lab Results  Component Value Date   TSH 5.86 (H) 01/25/2020    Insomnia, unspecified type Aggravated by pain. Good sleep hygiene. OTC Melatonin 5-10 mg may help.  Generalized osteoarthritis of multiple sites Continue Meloxicam 15 mg daily prn. Side effects discussed.  Class 2 severe obesity due to excess calories with serious comorbidity and body mass index (BMI) of 35.0 to 35.9 in adult [facility]) We discussed benefits of wt loss as well as adverse effects of obesity. Consistency with healthy diet and physical activity(low impact exercise) recommended.  Chronic fatigue Some of his chronic medical problems can be contributing factors. According to pt, sleep study is being arranged. Healthy diet and regular physical may help. Further recommendations according to TSH results.  RLS (restless legs syndrome) Problem is well controlled. No changes in Requip dose.  -     rOPINIRole (REQUIP) 0.25 MG tablet; Take 1 tablet (0.25 mg total) by mouth at bedtime.  Radicular pain of left lower extremity Problem is not well controlled. Continue following with ortho. Wt loss may help.  -      meloxicam (MOBIC) 15 MG tablet; Take 1 tablet (15 mg total) by mouth daily. If needed for pain.   Return in about 6 months (around 07/24/2020).   Melizza Kanode G. Martinique, MD  [facility]. [place] office.  A few things to remember from today's visit:  Generalized osteoarthritis of multiple sites  Insomnia, unspecified type  Class 2 severe obesity due to excess calories with serious comorbidity and body mass index (BMI) of 35.0 to 35.9 in adult [facility])  Chronic fatigue  Fatigue could be related to poor sleep,sleep apnea,stress,and pain. Please arrange appointment with orthopedist. Continue following with neuro and Dr Steven Vincent for sleep apnea.  We continue Meloxicam 1 tab daily as needed and Tylenol 500 mg 3-4 tab daily.  Caution with wt gain.  Please be sure medication list is accurate. If a new problem present, please set up appointment sooner than planned today.

## 2020-01-25 NOTE — Patient Instructions (Addendum)
A few things to remember from today's visit:  Generalized osteoarthritis of multiple sites  Insomnia, unspecified type  Class 2 severe obesity due to excess calories with serious comorbidity and body mass index (BMI) of 35.0 to 35.9 in adult [facility])  Chronic fatigue  Fatigue could be related to poor sleep,sleep apnea,stress,and pain. Please arrange appointment with orthopedist. Continue following with neuro and Dr [name] for sleep apnea.  We continue Meloxicam 1 tab daily as needed and Tylenol 500 mg 3-4 tab daily.  Caution with wt gain.  Please be sure medication list is accurate. If a new problem present, please set up appointment sooner than planned today.

## 2020-01-26 ENCOUNTER — Telehealth: Payer: Self-pay | Admitting: Adult Health

## 2020-01-26 LAB — TSH: TSH: 5.86 mIU/L — ABNORMAL HIGH (ref 0.40–4.50)

## 2020-01-26 NOTE — Telephone Encounter (Signed)
Petronila Doreatha Lew) called if you decide to fill SUMAtriptan 6 MG/0.5ML SOAJ would need new prescription. Can contact [phone]

## 2020-01-26 NOTE — Telephone Encounter (Signed)
Called patient, LVM to advise med refilled 01/18/20 zx 1 month, and per note he has moved. Will not refill to [facility] unless he schedules a follow up with [place]. We are glad to continue to see him. Left # for call back.

## 2020-01-28 DIAGNOSIS — F2089 Other schizophrenia: Secondary | ICD-10-CM | POA: Diagnosis not present

## 2020-01-31 ENCOUNTER — Ambulatory Visit (HOSPITAL_BASED_OUTPATIENT_CLINIC_OR_DEPARTMENT_OTHER): Payer: Medicare HMO | Attending: Pulmonary Disease | Admitting: Pulmonary Disease

## 2020-01-31 ENCOUNTER — Other Ambulatory Visit: Payer: Self-pay

## 2020-01-31 DIAGNOSIS — Z79899 Other long term (current) drug therapy: Secondary | ICD-10-CM | POA: Insufficient documentation

## 2020-01-31 DIAGNOSIS — G4733 Obstructive sleep apnea (adult) (pediatric): Secondary | ICD-10-CM | POA: Diagnosis not present

## 2020-01-31 DIAGNOSIS — Z9989 Dependence on other enabling machines and devices: Secondary | ICD-10-CM | POA: Insufficient documentation

## 2020-01-31 NOTE — Progress Notes (Signed)
Cardiology Office Note   Date:  02/01/2020   ID:  Steven Vincent, DOB May 08, 1965, MRN [id number]  PCP:  Martinique, Betty G, MD  Cardiologist:   Minus Breeding, MD   No chief complaint on file.     History of Present Illness: Steven Vincent is a 54 y.o. male who presents for follow up of PVCs and bradycardia.   POET ([place] Exercise Treadmill)  in January 2018 was normal. Holter in October 2018 showed frequent PVCs with burden 16%. Average HR 66. Slowest HR 40 while asleep. Beta blocker was reduced and eventually stopped. Echo was normal.   In 2019 Lexiscan  Myoview showed a small fixed apical defect and was otherwise normal. He had repeat Holter in December 2019 showing again frequent PVCs with a burden of 26%. 4 beat run NSVT. Minimum HR 58.  He was seen in the ED on March 9 with concern about slow HR, chest pain, and fatigue. Noted to have pulse of 40 by pulse ox reading. ECG showed frequent PVCs with bigeminy and couplets.   He was eventually treated with Multaq.  He had much improvement in symptoms.    He comes in today with a constellation of symptoms.  Predominantly he says he is so fatigued he cannot stand up.  This is inhibiting him from doing some work that he is supposed to be doing.  He does have obstructive sleep apnea and actually had a sleep study yesterday for CPAP titration.  He does use CPAP.  He has multiple complaints to include a constant chest discomfort.  He says he has had fevers and chills.  He says he tried to go to the pharmacy to get Covid test but he somehow did not understand how to get it.  He was going to move to [place] and he said he did that he had problems because he could not get a job there.  He is on disability he says.  He went to the emergency room in late November.  His complaints sound very similar today with predominantly fatigue associated with work.  I do note that his TSH was mildly elevated.  He was not anemic.  There were no acute  findings on his EKG.  I reviewed these records for this visit.  He is not describing palpitations, presyncope or syncope.  Past Medical History:  Diagnosis Date  . Arthritis   . Baker's cyst   . Headache(784.0)   . Knee pain   . Low back pain   . Migraines   . OSA (obstructive sleep apnea) 04/10/2017  . Personality disorder ([place])   . Seizures ([place])     Past Surgical History:  Procedure Laterality Date  . TOOTH EXTRACTION  12/2014   lower left     Current Outpatient Medications  Medication Sig Dispense Refill  . dronedarone (MULTAQ) 400 MG tablet Take 1 tablet (400 mg total) by mouth 2 (two) times daily with a meal. 180 tablet 3  . Erenumab-aooe (AIMOVIG) 140 MG/ML SOAJ Inject 140 mg into the skin every 30 (thirty) days. 3 pen 0  . meloxicam (MOBIC) 15 MG tablet Take 1 tablet (15 mg total) by mouth daily. If needed for pain. 90 tablet 0  . nortriptyline (PAMELOR) 50 MG capsule Take 2 capsules (100 mg total) by mouth at bedtime. 180 capsule 2  . pantoprazole (PROTONIX) 40 MG tablet Take 1 tablet by mouth daily.    . pravastatin (PRAVACHOL) 20 MG tablet Take 1  tablet (20 mg total) by mouth daily. 90 tablet 3  . rOPINIRole (REQUIP) 0.25 MG tablet Take 1 tablet (0.25 mg total) by mouth at bedtime. 90 tablet 1  . SUMAtriptan 6 MG/0.5ML SOAJ INJECT 6 MG INTO THE SKIN DAILY AS NEEDED AT MIGRAINE ONSET. MAY REPEAT 1 TIME AFTER 1 HOUR IF NEEDED 0.5 mL 0  . topiramate (TOPAMAX) 50 MG tablet Take one tablet in the AM and two tablets in the PM. 270 tablet 3   No current facility-administered medications for this visit.    Allergies:   Delsym [dextromethorphan], Aspirin, and Mucinex d [pseudoephedrine-guaifenesin er]    ROS:  Please see the history of present illness.   Otherwise, review of systems are positive for none.   All other systems are reviewed and negative.    PHYSICAL EXAM: VS:  BP 118/80   Pulse 91   Ht 6' 0.5" (1.842 m)   Wt 257 lb 12.8 oz (116.9 kg)   SpO2 91%   BMI  34.48 kg/m  , BMI Body mass index is 34.48 kg/m. GENERAL:  Well appearing NECK:  No jugular venous distention, waveform within normal limits, carotid upstroke brisk and symmetric, no bruits, no thyromegaly LUNGS:  Clear to auscultation bilaterally CHEST:  Unremarkable HEART:  PMI not displaced or sustained,S1 and S2 within normal limits, no S3, no S4, no clicks, no rubs, no murmurs ABD:  Flat, positive bowel sounds normal in frequency in pitch, no bruits, no rebound, no guarding, no midline pulsatile mass, no hepatomegaly, no splenomegaly EXT:  2 plus pulses throughout, no edema, no cyanosis no clubbing   EKG:  EKG is  ordered today. The ekg ordered today demonstrates normal sinus rhythm, rate 89, axis within normal limits, intervals within normal limits, no acute ST-T wave changes.   Recent Labs: 04/19/2019: Magnesium 2.0 01/13/2020: BUN 19; Creatinine, Ser 0.89; Hemoglobin 14.4; Platelets 161; Potassium 4.0; Sodium 140 01/25/2020: TSH 5.86    Lipid Panel    Component Value Date/Time   CHOL 124 07/07/2018 0833   TRIG 228.0 (H) 07/07/2018 0833   TRIG 231 (HH) 12/27/2005 1005   HDL 28.90 (L) 07/07/2018 0833   CHOLHDL 4 07/07/2018 0833   VLDL 45.6 (H) 07/07/2018 0833   LDLCALC 74 10/16/2016 0900   LDLDIRECT 70.0 07/07/2018 0833      Wt Readings from Last 3 Encounters:  02/01/20 257 lb 12.8 oz (116.9 kg)  01/31/20 270 lb (122.5 kg)  01/25/20 269 lb 6.4 oz (122.2 kg)      Other studies Reviewed: Additional studies/ records that were reviewed today include: ED records. Review of the above records demonstrates: See elsewhere   ASSESSMENT AND PLAN:  Frequent PVCs:  Is really not describing symptoms related to this.  He is tolerated the Multaq.  No change in therapy.   OSA:   He wears CPAP.  He is awaiting any further instructions from his CPAP titration.  N  CHEST PAIN: His chest pain is atypical.  There is no objective evidence of ischemia.  My index of suspicion for  obstructive coronary disease is low.  However, he would be able to walk on a treadmill so I will order a Lexiscan Myoview.  FATIGUE: I will check a CBC, T3 and T4.  However, this may well be related to his sleep apnea.  FEVER: We have filled out some paperwork to help him go get Covid testing.   Current medicines are reviewed at length with the patient today.  The patient does  not have concerns regarding medicines.  The following changes have been made:  None  Labs/ tests ordered today include:   Orders Placed This Encounter  Procedures  . CBC  . T4, free  . T3  . MYOCARDIAL PERFUSION IMAGING     Disposition:   FU with APP in six months.     Signed, Minus Breeding, MD  02/01/2020 5:30 PM    [place]

## 2020-02-01 ENCOUNTER — Telehealth: Payer: Self-pay | Admitting: Pulmonary Disease

## 2020-02-01 ENCOUNTER — Encounter: Payer: Self-pay | Admitting: Cardiology

## 2020-02-01 ENCOUNTER — Ambulatory Visit (INDEPENDENT_AMBULATORY_CARE_PROVIDER_SITE_OTHER): Payer: Medicare HMO | Admitting: Cardiology

## 2020-02-01 VITALS — BP 118/80 | HR 91 | Ht 72.5 in | Wt 257.8 lb

## 2020-02-01 DIAGNOSIS — I493 Ventricular premature depolarization: Secondary | ICD-10-CM

## 2020-02-01 DIAGNOSIS — R079 Chest pain, unspecified: Secondary | ICD-10-CM

## 2020-02-01 DIAGNOSIS — Z79899 Other long term (current) drug therapy: Secondary | ICD-10-CM

## 2020-02-01 DIAGNOSIS — R5383 Other fatigue: Secondary | ICD-10-CM | POA: Diagnosis not present

## 2020-02-01 DIAGNOSIS — G4733 Obstructive sleep apnea (adult) (pediatric): Secondary | ICD-10-CM

## 2020-02-01 DIAGNOSIS — E782 Mixed hyperlipidemia: Secondary | ICD-10-CM | POA: Diagnosis not present

## 2020-02-01 NOTE — Patient Instructions (Signed)
Medication Instructions:  Continue current medications  *If you need a refill on your cardiac medications before your next appointment, please call your pharmacy*   Lab Work: CBC, T3, T4  If you have labs (blood work) drawn today and your tests are completely normal, you will receive your results only by: Marland Kitchen MyChart Message (if you have MyChart) OR . A paper copy in the mail If you have any lab test that is abnormal or we need to change your treatment, we will call you to review the results.   Testing/Procedures: Your physician has requested that you have a lexiscan myoview. For further information please visit [email or web address]. Please follow instruction sheet, as given.   Follow-Up: At [facility], you and your health needs are our priority.  As part of our continuing mission to provide you with exceptional heart care, we have created designated Provider Care Teams.  These Care Teams include your primary Cardiologist (physician) and Advanced Practice Providers (APPs -  Physician Assistants and Nurse Practitioners) who all work together to provide you with the care you need, when you need it.  We recommend signing up for the patient portal called "MyChart".  Sign up information is provided on this After Visit Summary.  MyChart is used to connect with patients for Virtual Visits (Telemedicine).  Patients are able to view lab/test results, encounter notes, upcoming appointments, etc.  Non-urgent messages can be sent to your provider as well.   To learn more about what you can do with MyChart, go to [email or web address].    Your next appointment:   6 month(s)  The format for your next appointment:   In Person  Provider:   You may see Minus Breeding, MD or one of the following Advanced Practice Providers on your designated Care Team:    Rosaria Ferries, PA-C  Jory Sims, DNP, ANP

## 2020-02-01 NOTE — Procedures (Signed)
    Patient Name: Steven Vincent, Steven Vincent Date: 01/31/2020 Gender: Male D.O.B: 02-Nov-1965 Age (years): 21 Referring Provider: [name] MD, ABSM Height (inches): 74 Interpreting Physician: [name] MD, ABSM Weight (lbs): 270 RPSGT: Gwenyth Allegra BMI: 35 MRN: [id number] Neck Size: 18.00  CLINICAL INFORMATION The patient is referred for a CPAP titration to treat sleep apnea.  He has history of obstructive sleep apnea, but has trouble adjusting to auto CPAP use.  Date of HST 07/07/18: AHI 7.7, SpO2 low 83%.  SLEEP STUDY TECHNIQUE As per the AASM Manual for the Scoring of Sleep and Associated Events v2.3 (April 2016) with a hypopnea requiring 4% desaturations.  The channels recorded and monitored were frontal, central and occipital EEG, electrooculogram (EOG), submentalis EMG (chin), nasal and oral airflow, thoracic and abdominal wall motion, anterior tibialis EMG, snore microphone, electrocardiogram, and pulse oximetry. Continuous positive airway pressure (CPAP) was initiated at the beginning of the study and titrated to treat sleep-disordered breathing.  MEDICATIONS Medications self-administered by patient taken the night of the study : multaq, ROPINIROLE HCL, NORTRIPTYLINE  TECHNICIAN COMMENTS Comments added by technician: None Comments added by scorer: N/A  RESPIRATORY PARAMETERS Optimal PAP Pressure (cm): 13 AHI at Optimal Pressure (/hr): 0.0 Overall Minimal O2 (%): 100.0 Supine % at Optimal Pressure (%): 86 Minimal O2 at Optimal Pressure (%): 90.0   SLEEP ARCHITECTURE The study was initiated at 99:99:99 and ended at 99:99:99.  Sleep onset time was 5.5 minutes and the sleep efficiency was 999.9%%. The total sleep time was 380 minutes.  The patient spent 100.0%% of the night in stage N1 sleep, 100.0%% in stage N2 sleep, 100.0%% in stage N3 and 5.6% in REM.Stage REM latency was 5.5 minutes  Wake after sleep onset was 5.5. Alpha intrusion was absent. Supine sleep was  5.55%.  CARDIAC DATA The 2 lead EKG demonstrated sinus rhythm. The mean heart rate was 100.0 beats per minute. Other EKG findings include: PVCs.  LEG MOVEMENT DATA The total Periodic Limb Movements of Sleep (PLMS) were 5. The PLMS index was 5.5. A PLMS index of <15 is considered normal in adults.  IMPRESSIONS - The optimal PAP pressure was 13 cm of water. - He did not require supplemental oxygen during this study. - He did not have REM sleep during this study.  DIAGNOSIS - Obstructive Sleep Apnea (G47.33)  RECOMMENDATIONS - Trial of CPAP therapy on 13 cm H2O with a Medium size Fisher&Paykel Full Face Mask F&P Vitera (new) mask and heated humidification.  [Electronically signed] 02/01/2020 11:24 AM  [name] MD, ABSM Diplomate, American Board of Sleep Medicine   NPI: [id number]

## 2020-02-01 NOTE — Telephone Encounter (Signed)
CPAP titration 01/31/20 >> CPAP 13 >> AHI 0, no REM.   Please let him know that his sleep study showed he did better with CPAP at fixed pressure of 13 cm water instead of variable auto CPAP setting he has been using at home.  Please have [facility] change his CPAP to 13 cm H2O.

## 2020-02-02 ENCOUNTER — Telehealth: Payer: Self-pay | Admitting: Cardiology

## 2020-02-02 ENCOUNTER — Other Ambulatory Visit: Payer: Self-pay | Admitting: *Deleted

## 2020-02-02 DIAGNOSIS — Z20822 Contact with and (suspected) exposure to covid-19: Secondary | ICD-10-CM | POA: Diagnosis not present

## 2020-02-02 DIAGNOSIS — R079 Chest pain, unspecified: Secondary | ICD-10-CM

## 2020-02-02 NOTE — Telephone Encounter (Signed)
Called and went over CPAP titration results per Dr [name] with patient. All questions answered and patient expressed full understanding. Patient agreeable to Dr Juanetta Gosling recommendation to send order in for new CPAP settings and requested an order for new CPAP supplies. Dr [name] gave verbal order to send order in for new CPAP supplies along with order placed for change CPAP to 13 cm H2O. Orders placed per Dr [name]. Nothing further needed at this time.

## 2020-02-02 NOTE — Telephone Encounter (Signed)
Randall Hiss is calling on behalf of [employer] in regards to the prior authorization received for the patient to have a Lexiscan performed. 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 Due to this he states there are two options Dr. Percival Spanish can choose from. The first would be to withdraw the prior authorization request and have a ETT performed or have this sent for review by their Cardiologist and have a possible peer to peer conversation in regards to this. Randall Hiss is requesting a callback with Dr. Rosezella [place] decision. He states the case # for when calling back is [id number]. Please advise.

## 2020-02-02 NOTE — Progress Notes (Signed)
[place]

## 2020-02-03 NOTE — Addendum Note (Signed)
Addended by: Vennie Homans on: 02/03/2020 02:06 PM   Modules accepted: Orders

## 2020-02-03 NOTE — Telephone Encounter (Signed)
Can we please check before I have this phone call.  I mention in my note that I would have ordered an ETT.  However, he cannot walk on a treadmill.  I documented that.

## 2020-02-03 NOTE — Telephone Encounter (Signed)
Left message for Steven Vincent. Patient is unable to ambulate to perform ETT. Request the process continue for Lexiscan. Will await call back.

## 2020-02-04 ENCOUNTER — Telehealth (HOSPITAL_COMMUNITY): Payer: Self-pay | Admitting: Cardiology

## 2020-02-04 ENCOUNTER — Ambulatory Visit (HOSPITAL_COMMUNITY)
Admission: RE | Admit: 2020-02-04 | Payer: Medicare HMO | Source: Ambulatory Visit | Attending: Cardiology | Admitting: Cardiology

## 2020-02-04 NOTE — Telephone Encounter (Signed)
Patient called and cancelled Myoview and states that he is not feeling well and will call back at a later date to reschedule. Order will be removed from the [place] and when patient calls back to reschedule we will reinstate the order. Thank you.

## 2020-02-04 NOTE — Telephone Encounter (Signed)
Left message to call back to make sure not cardiac s/s

## 2020-02-08 ENCOUNTER — Encounter (HOSPITAL_COMMUNITY): Payer: Medicare HMO

## 2020-02-09 ENCOUNTER — Ambulatory Visit (INDEPENDENT_AMBULATORY_CARE_PROVIDER_SITE_OTHER): Payer: Medicare HMO | Admitting: Family Medicine

## 2020-02-09 ENCOUNTER — Ambulatory Visit (HOSPITAL_COMMUNITY)
Admission: RE | Admit: 2020-02-09 | Discharge: 2020-02-09 | Disposition: A | Discharge status: Home / Self Care | Payer: Medicare HMO | Source: Ambulatory Visit | Attending: Cardiology | Admitting: Cardiology

## 2020-02-09 ENCOUNTER — Other Ambulatory Visit: Payer: Self-pay

## 2020-02-09 VITALS — BP 118/82 | HR 84 | Temp 97.2°F | Resp 16 | Ht 72.0 in | Wt 246.4 lb

## 2020-02-09 DIAGNOSIS — R5383 Other fatigue: Secondary | ICD-10-CM | POA: Diagnosis not present

## 2020-02-09 DIAGNOSIS — R079 Chest pain, unspecified: Secondary | ICD-10-CM | POA: Diagnosis not present

## 2020-02-09 DIAGNOSIS — R7989 Other specified abnormal findings of blood chemistry: Secondary | ICD-10-CM

## 2020-02-09 LAB — MYOCARDIAL PERFUSION IMAGING
LV dias vol: 118 mL (ref 62–150)
LV sys vol: 57 mL
Peak HR: 90 {beats}/min
Rest HR: 75 {beats}/min
SDS: 0
SRS: 0
SSS: 0
TID: 1.29

## 2020-02-09 MED ORDER — TECHNETIUM TC 99M TETROFOSMIN IV KIT
11.0000 | PACK | Freq: Once | INTRAVENOUS | Status: AC | PRN
  Administered 2020-02-09: 11 via INTRAVENOUS
  Filled 2020-02-09: qty 11

## 2020-02-09 MED ORDER — TECHNETIUM TC 99M TETROFOSMIN IV KIT
31.8000 | PACK | Freq: Once | INTRAVENOUS | Status: AC | PRN
  Administered 2020-02-09: 13:00:00 31.8 via INTRAVENOUS
  Filled 2020-02-09: qty 32

## 2020-02-09 MED ORDER — ADENOSINE (DIAGNOSTIC) 3 MG/ML IV SOLN
0.5600 mg/kg | Freq: Once | INTRAVENOUS | Status: AC
  Administered 2020-02-09: 65.4 mg via INTRAVENOUS

## 2020-02-09 NOTE — Patient Instructions (Signed)
A few things to remember from today's visit:   Abnormal TSH - Plan: TSH, T4, free, T3, free  Other fatigue  If you need refills please call your pharmacy. Do not use My Chart to request refills or for acute issues that need immediate attention.  Depending of lab results, we will consider treatment or we will bring you back in 2 months to re-check.   Please be sure medication list is accurate. If a new problem present, please set up appointment sooner than planned today.

## 2020-02-09 NOTE — Progress Notes (Signed)
HPI: [name] is a 54 y.o. male, who is here today to follow on recent OV. He was last seen on 01/25/20, when he was c/o worsening fatigue. He has Hx of fatigue.  Since his last visit he was dx'ed with COVID 19 infection about 10 days ago. He is feeling better and fatigue has also improved. Negative for fever,sore throat,nasal congestion, anosmia,ageusia,cough, or wheezing.  Mildly abnormal TSH. He has lost some wt due to decreased oral intake due to recent illness. Negative for changes in bowel movement,palpitations,tremor, heat/cold intolerance.  Lab Results  Component Value Date   TSH 5.86 (H) 01/25/2020   He states that he had "chemical" stress test, did not like how he felt during test but according to pt, it was negative. Pending nuclear result. He denies headache, CP,SOB, diaphoresis, N/V,or edema.  Review of Systems  Constitutional: Positive for activity change, appetite change and fatigue.  HENT: Negative for mouth sores and nosebleeds.   Eyes: Negative for redness and visual disturbance.  Gastrointestinal: Negative for abdominal pain and blood in stool.  Genitourinary: Negative for decreased urine volume, dysuria and hematuria.  Neurological: Negative for syncope, facial asymmetry and weakness.  Psychiatric/Behavioral: Negative for confusion.  Rest see pertinent positives and negatives per HPI.  Current Outpatient Medications on File Prior to Visit  Medication Sig Dispense Refill  . dronedarone (MULTAQ) 400 MG tablet Take 1 tablet (400 mg total) by mouth 2 (two) times daily with a meal. 180 tablet 3  . Erenumab-aooe (AIMOVIG) 140 MG/ML SOAJ Inject 140 mg into the skin every 30 (thirty) days. 3 pen 0  . meloxicam (MOBIC) 15 MG tablet Take 1 tablet (15 mg total) by mouth daily. If needed for pain. 90 tablet 0  . nortriptyline (PAMELOR) 50 MG capsule Take 2 capsules (100 mg total) by mouth at bedtime. 180 capsule 2  . pantoprazole (PROTONIX) 40 MG  tablet Take 1 tablet by mouth daily.    . pravastatin (PRAVACHOL) 20 MG tablet Take 1 tablet (20 mg total) by mouth daily. 90 tablet 3  . rOPINIRole (REQUIP) 0.25 MG tablet Take 1 tablet (0.25 mg total) by mouth at bedtime. 90 tablet 1  . SUMAtriptan 6 MG/0.5ML SOAJ INJECT 6 MG INTO THE SKIN DAILY AS NEEDED AT MIGRAINE ONSET. MAY REPEAT 1 TIME AFTER 1 HOUR IF NEEDED 0.5 mL 0  . topiramate (TOPAMAX) 50 MG tablet Take one tablet in the AM and two tablets in the PM. 270 tablet 3   No current facility-administered medications on file prior to visit.   Past Medical History:  Diagnosis Date  . Arthritis   . Baker's cyst   . Headache(784.0)   . Knee pain   . Low back pain   . Migraines   . OSA (obstructive sleep apnea) 04/10/2017  . Personality disorder ([place])   . Seizures ([facility])    Allergies  Allergen Reactions  . Delsym [Dextromethorphan] Other (See Comments)    Gets loopy/memory loss  . Aspirin     GI upset Other reaction(s): Vomiting  . Mucinex D [Pseudoephedrine-Guaifenesin Er] Hypertension    Social History   Socioeconomic History  . Marital status: Divorced    Spouse name: Not on file  . Number of children: 0  . Years of education: Not on file  . Highest education level: Not on file  Occupational History  . Occupation: disabled  Tobacco Use  . Smoking status: Former Smoker    Packs/day: 1.00  Years: 1.00    Pack years: 1.00    Types: Cigarettes  . Smokeless tobacco: Never Used  . Tobacco comment: states he only tried as a teenager  Vaping Use  . Vaping Use: Never used  Substance and Sexual Activity  . Alcohol use: No  . Drug use: No  . Sexual activity: Not Currently  Other Topics Concern  . Not on file  Social History Narrative   03/16/18: Lives alone with dog, Steven Vincent in apartment. Wife of 16 years left him about 2 years ago, and pt. has had difficulty adjusting.   Says his brother Steven Vincent, who lives in [place], is his main contact, source of support, as well as his  dog.    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 States he uses painting as additional therapy.    Social Determinants of Health   Financial Resource Strain: Not on file  Food Insecurity: Not on file  Transportation Needs: Not on file  Physical Activity: Not on file  Stress: Not on file  Social Connections: Not on file    Vitals:   02/09/20 1444  BP: 118/82  Pulse: 84  Resp: 16  Temp: (!) 97.2 F (36.2 C)  SpO2: 92%   Body mass index is 33.42 kg/m.  Physical Exam Vitals and nursing note reviewed.  Constitutional:      General: He is not in acute distress.    Appearance: He is well-developed.  HENT:     Head: Normocephalic and atraumatic.     Mouth/Throat:     Mouth: Oropharynx is clear and moist and mucous membranes are normal. Mucous membranes are moist.     Pharynx: Oropharynx is clear.  Eyes:     Conjunctiva/sclera: Conjunctivae normal.  Neck:     Thyroid: No thyroid mass or thyromegaly.  Cardiovascular:     Rate and Rhythm: Normal rate and regular rhythm.     Heart sounds: No murmur heard.   Pulmonary:     Effort: Pulmonary effort is normal. No respiratory distress.     Breath sounds: Normal breath sounds.  Musculoskeletal:        General: No edema.  Lymphadenopathy:     Cervical: No cervical adenopathy.  Skin:    General: Skin is warm.     Findings: No erythema or rash.  Neurological:     Mental Status: He is alert and oriented to person, place, and time.     Gait: Gait normal.     Deep Tendon Reflexes: Strength normal.  Psychiatric:        Mood and Affect: Mood and affect normal.        Cognition and Memory: Cognition and memory normal.    ASSESSMENT AND PLAN:  [name] was seen today for follow-up.  Diagnoses and all orders for this visit:  Orders Placed This Encounter  Procedures  . TSH  . T4, free  . T3, free   Lab Results  Component  Value Date   TSH 3.04 02/09/2020    Abnormal TSH Mild. Further recommendations according to TSH result.  Other fatigue Aggravated by recent illness, getting back to his baseline. Healthier diet and low impact physical activity recommended.   Return in about 2 months (around 04/11/2020) for cpe.   Lean Jaeger G. [place], MD  [facility]. [place] office.   A few things to remember from today's visit:   Abnormal TSH - Plan: TSH, T4,  free, T3, free  Other fatigue  If you need refills please call your pharmacy. Do not use My Chart to request refills or for acute issues that need immediate attention.  Depending of lab results, we will consider treatment or we will bring you back in 2 months to re-check.   Please be sure medication list is accurate. If a new problem present, please set up appointment sooner than planned today.

## 2020-02-10 LAB — TSH: TSH: 3.04 mIU/L (ref 0.40–4.50)

## 2020-02-10 LAB — T3, FREE: T3, Free: 2.5 pg/mL (ref 2.3–4.2)

## 2020-02-10 LAB — T4, FREE: Free T4: 1.5 ng/dL (ref 0.8–1.8)

## 2020-02-10 NOTE — Telephone Encounter (Signed)
Spoke with patient and he had test yesterday   If test ok why is he getting shortness of breath with exertion (ie climbing steps) He is very concerned about this and wants to make sure not heart related and needs to know where to go from here

## 2020-02-11 NOTE — Telephone Encounter (Signed)
Patient called w/results  Routed to PCP

## 2020-02-11 NOTE — Telephone Encounter (Signed)
Minus Breeding, MD  02/11/2020 2:09 PM EST      The symptoms were atypical. This was a negative stress test. I am not suggesting any further testing. I do not see a cardiac etiology to his complaints and would suggest he follow-up with his primary provider. I was happy to see these results.

## 2020-02-11 NOTE — Telephone Encounter (Signed)
Please see results note.

## 2020-02-13 ENCOUNTER — Encounter: Payer: Self-pay | Admitting: Family Medicine

## 2020-02-14 ENCOUNTER — Telehealth: Payer: Self-pay | Admitting: Family Medicine

## 2020-02-14 DIAGNOSIS — F819 Developmental disorder of scholastic skills, unspecified: Secondary | ICD-10-CM | POA: Diagnosis not present

## 2020-02-14 NOTE — Telephone Encounter (Signed)
Pt call and stated he was returning sarah about his labs that was done last week and want a call back.

## 2020-02-14 NOTE — Telephone Encounter (Signed)
Informed patient of results and patient verbalized understanding.  

## 2020-03-03 DIAGNOSIS — G4733 Obstructive sleep apnea (adult) (pediatric): Secondary | ICD-10-CM | POA: Diagnosis not present

## 2020-03-08 DIAGNOSIS — M170 Bilateral primary osteoarthritis of knee: Secondary | ICD-10-CM | POA: Diagnosis not present

## 2020-03-09 ENCOUNTER — Telehealth: Payer: Self-pay | Admitting: Family Medicine

## 2020-03-09 NOTE — Telephone Encounter (Signed)
Left message for patient to call back and schedule Medicare Annual Wellness Visit (AWV) either virtually or in office.   Last AWV 03/17/19  please schedule at anytime with [facility] Nurse Health Advisor 1 or 2   This should be a 45 minute visit.

## 2020-03-26 ENCOUNTER — Other Ambulatory Visit: Payer: Self-pay | Admitting: Adult Health

## 2020-03-27 DIAGNOSIS — F2089 Other schizophrenia: Secondary | ICD-10-CM | POA: Diagnosis not present

## 2020-03-28 DIAGNOSIS — F819 Developmental disorder of scholastic skills, unspecified: Secondary | ICD-10-CM | POA: Diagnosis not present

## 2020-03-30 ENCOUNTER — Other Ambulatory Visit: Payer: Self-pay | Admitting: Family Medicine

## 2020-03-30 DIAGNOSIS — M5410 Radiculopathy, site unspecified: Secondary | ICD-10-CM

## 2020-04-05 ENCOUNTER — Ambulatory Visit (INDEPENDENT_AMBULATORY_CARE_PROVIDER_SITE_OTHER): Payer: Medicare HMO

## 2020-04-05 ENCOUNTER — Other Ambulatory Visit: Payer: Self-pay

## 2020-04-05 VITALS — BP 130/82 | HR 85 | Temp 97.9°F | Ht 72.0 in | Wt 262.4 lb

## 2020-04-05 DIAGNOSIS — Z0000 Encounter for general adult medical examination without abnormal findings: Secondary | ICD-10-CM | POA: Diagnosis not present

## 2020-04-05 NOTE — Patient Instructions (Signed)
Steven Vincent , Thank you for taking time to come for your Medicare Wellness Visit. I appreciate your ongoing commitment to your health goals. Please review the following plan we discussed and let me know if I can assist you in the future.   Screening recommendations/referrals: Colonoscopy: Up to date, next due 09/20/2025 Recommended yearly ophthalmology/optometry visit for glaucoma screening and checkup Recommended yearly dental visit for hygiene and checkup  Vaccinations: Influenza vaccine: Currently due, if you wish to receive you may do so in our office or at your local pharmacy  Pneumococcal vaccine: Not due until age 46 unless directed by your provider  Tdap vaccine: Currently due, you may await and injury to receive so that it will be covered by your insurance  Shingles vaccine: Currently due for Shingrix, if you wish to receive we recommend that you do so at your local pharmacy so that we may scan into your chart.     Advanced directives: Advance directive discussed with you today. Even though you declined this today please call our office should you change your mind and we can give you the proper paperwork for you to fill out.   Conditions/risks identified: None   Next appointment: 04/06/2021 @ 2:00 PM with Dana 65 Years and Older, Male Preventive care refers to lifestyle choices and visits with your health care provider that can promote health and wellness. What does preventive care include?  A yearly physical exam. This is also called an annual well check.  Dental exams once or twice a year.  Routine eye exams. Ask your health care provider how often you should have your eyes checked.  Personal lifestyle choices, including:  Daily care of your teeth and gums.  Regular physical activity.  Eating a healthy diet.  Avoiding tobacco and drug use.  Limiting alcohol use.  Practicing safe sex.  Taking low doses of aspirin every  day.  Taking vitamin and mineral supplements as recommended by your health care provider. What happens during an annual well check? The services and screenings done by your health care provider during your annual well check will depend on your age, overall health, lifestyle risk factors, and family history of disease. Counseling  Your health care provider may ask you questions about your:  Alcohol use.  Tobacco use.  Drug use.  Emotional well-being.  Home and relationship well-being.  Sexual activity.  Eating habits.  History of falls.  Memory and ability to understand (cognition).  Work and work Statistician. Screening  You may have the following tests or measurements:  Height, weight, and BMI.  Blood pressure.  Lipid and cholesterol levels. These may be checked every 5 years, or more frequently if you are over 95 years old.  Skin check.  Lung cancer screening. You may have this screening every year starting at age 28 if you have a 30-pack-year history of smoking and currently smoke or have quit within the past 15 years.  Fecal occult blood test (FOBT) of the stool. You may have this test every year starting at age 52.  Flexible sigmoidoscopy or colonoscopy. You may have a sigmoidoscopy every 5 years or a colonoscopy every 10 years starting at age 63.  Prostate cancer screening. Recommendations will vary depending on your family history and other risks.  Hepatitis C blood test.  Hepatitis B blood test.  Sexually transmitted disease (STD) testing.  Diabetes screening. This is done by checking your blood sugar (glucose) after you have not eaten  for a while (fasting). You may have this done every 1-3 years.  Abdominal aortic aneurysm (AAA) screening. You may need this if you are a current or former smoker.  Osteoporosis. You may be screened starting at age 67 if you are at high risk. Talk with your health care provider about your test results, treatment options,  and if necessary, the need for more tests. Vaccines  Your health care provider may recommend certain vaccines, such as:  Influenza vaccine. This is recommended every year.  Tetanus, diphtheria, and acellular pertussis (Tdap, Td) vaccine. You may need a Td booster every 10 years.  Zoster vaccine. You may need this after age 38.  Pneumococcal 13-valent conjugate (PCV13) vaccine. One dose is recommended after age 30.  Pneumococcal polysaccharide (PPSV23) vaccine. One dose is recommended after age 38. Talk to your health care provider about which screenings and vaccines you need and how often you need them. This information is not intended to replace advice given to you by your health care provider. Make sure you discuss any questions you have with your health care provider. Document Released: 03/10/2015 Document Revised: 11/01/2015 Document Reviewed: 12/13/2014 Elsevier Interactive Patient Education  2017 [place] Prevention in the Home Falls can cause injuries. They can happen to people of all ages. There are many things you can do to make your home safe and to help prevent falls. What can I do on the outside of my home?  Regularly fix the edges of walkways and driveways and fix any cracks.  Remove anything that might make you trip as you walk through a door, such as a raised step or threshold.  Trim any bushes or trees on the path to your home.  Use bright outdoor lighting.  Clear any walking paths of anything that might make someone trip, such as rocks or tools.  Regularly check to see if handrails are loose or broken. Make sure that both sides of any steps have handrails.  Any raised decks and porches should have guardrails on the edges.  Have any leaves, snow, or ice cleared regularly.  Use sand or salt on walking paths during winter.  Clean up any spills in your garage right away. This includes oil or grease spills. What can I do in the bathroom?  Use night  lights.  Install grab bars by the toilet and in the tub and shower. Do not use towel bars as grab bars.  Use non-skid mats or decals in the tub or shower.  If you need to sit down in the shower, use a plastic, non-slip stool.  Keep the floor dry. Clean up any water that spills on the floor as soon as it happens.  Remove soap buildup in the tub or shower regularly.  Attach bath mats securely with double-sided non-slip rug tape.  Do not have throw rugs and other things on the floor that can make you trip. What can I do in the bedroom?  Use night lights.  Make sure that you have a light by your bed that is easy to reach.  Do not use any sheets or blankets that are too big for your bed. They should not hang down onto the floor.  Have a firm chair that has side arms. You can use this for support while you get dressed.  Do not have throw rugs and other things on the floor that can make you trip. What can I do in the kitchen?  Clean up any spills right  away.  Avoid walking on wet floors.  Keep items that you use a lot in easy-to-reach places.  If you need to reach something above you, use a strong step stool that has a grab bar.  Keep electrical cords out of the way.  Do not use floor polish or wax that makes floors slippery. If you must use wax, use non-skid floor wax.  Do not have throw rugs and other things on the floor that can make you trip. What can I do with my stairs?  Do not leave any items on the stairs.  Make sure that there are handrails on both sides of the stairs and use them. Fix handrails that are broken or loose. Make sure that handrails are as long as the stairways.  Check any carpeting to make sure that it is firmly attached to the stairs. Fix any carpet that is loose or worn.  Avoid having throw rugs at the top or bottom of the stairs. If you do have throw rugs, attach them to the floor with carpet tape.  Make sure that you have a light switch at the  top of the stairs and the bottom of the stairs. If you do not have them, ask someone to add them for you. What else can I do to help prevent falls?  Wear shoes that:  Do not have high heels.  Have rubber bottoms.  Are comfortable and fit you well.  Are closed at the toe. Do not wear sandals.  If you use a stepladder:  Make sure that it is fully opened. Do not climb a closed stepladder.  Make sure that both sides of the stepladder are locked into place.  Ask someone to hold it for you, if possible.  Clearly mark and make sure that you can see:  Any grab bars or handrails.  First and last steps.  Where the edge of each step is.  Use tools that help you move around (mobility aids) if they are needed. These include:  Canes.  Walkers.  Scooters.  Crutches.  Turn on the lights when you go into a dark area. Replace any light bulbs as soon as they burn out.  Set up your furniture so you have a clear path. Avoid moving your furniture around.  If any of your floors are uneven, fix them.  If there are any pets around you, be aware of where they are.  Review your medicines with your doctor. Some medicines can make you feel dizzy. This can increase your chance of falling. Ask your doctor what other things that you can do to help prevent falls. This information is not intended to replace advice given to you by your health care provider. Make sure you discuss any questions you have with your health care provider. Document Released: 12/08/2008 Document Revised: 07/20/2015 Document Reviewed: 03/18/2014 Elsevier Interactive Patient Education  2017 [employer].

## 2020-04-05 NOTE — Progress Notes (Signed)
Subjective:   Steven Vincent is a 55 y.o. male who presents for an Initial Medicare Annual Wellness Visit.  Review of Systems    N/A  Cardiac Risk Factors include: male gender;dyslipidemia     Objective:    Today's Vitals   04/05/20 1422  BP: 130/82  Pulse: 85  Temp: 97.9 F (36.6 C)  TempSrc: Oral  SpO2: 97%  Weight: 262 lb 6 oz (119 kg)  Height: 6' (1.829 m)   Body mass index is 35.58 kg/m.  Advanced Directives 04/05/2020 01/31/2020 01/13/2020 05/04/2018 03/16/2018 05/05/2017 03/04/2017  Does Patient Have a Medical Advance Directive? Yes Yes No No Yes No No  Type of Paramedic of [place];Living will Living will;Healthcare Power of [place] - -  Does patient want to make changes to medical advance directive? No - Patient declined No - Patient declined - - No - Patient declined - -  Copy of [place] in Chart? No - copy requested - - - Yes - validated most recent copy scanned in chart (See row information) - -  Would patient like information on creating a medical advance directive? - No - Patient declined No - Patient declined No - Patient declined - No - Patient declined -    Current Medications (verified) Outpatient Encounter Medications as of 04/05/2020  Medication Sig  . dronedarone (MULTAQ) 400 MG tablet Take 1 tablet (400 mg total) by mouth 2 (two) times daily with a meal.  . Erenumab-aooe (AIMOVIG) 140 MG/ML SOAJ Inject 140 mg into the skin every 30 (thirty) days.  . meloxicam (MOBIC) 15 MG tablet TAKE 1 TABLET (15 MG TOTAL) BY MOUTH DAILY. IF NEEDED FOR PAIN.  Marland Kitchen nortriptyline (PAMELOR) 50 MG capsule TAKE 2 CAPSULES AT BEDTIME  . pantoprazole (PROTONIX) 40 MG tablet Take 1 tablet by mouth daily.  . pravastatin (PRAVACHOL) 20 MG tablet Take 1 tablet (20 mg total) by mouth daily.  Marland Kitchen rOPINIRole (REQUIP) 0.25 MG tablet Take 1 tablet (0.25 mg total) by mouth at bedtime.  . SUMAtriptan 6 MG/0.5ML SOAJ  INJECT 6 MG INTO THE SKIN DAILY AS NEEDED AT MIGRAINE ONSET. MAY REPEAT 1 TIME AFTER 1 HOUR IF NEEDED  . topiramate (TOPAMAX) 50 MG tablet Take one tablet in the AM and two tablets in the PM.   No facility-administered encounter medications on file as of 04/05/2020.    Allergies (verified) Delsym [dextromethorphan], Aspirin, and Mucinex d [pseudoephedrine-guaifenesin er]   History: Past Medical History:  Diagnosis Date  . Arthritis   . Baker's cyst   . Headache(784.0)   . Knee pain   . Low back pain   . Migraines   . OSA (obstructive sleep apnea) 04/10/2017  . Personality disorder ([place])   . Seizures ([place])    Past Surgical History:  Procedure Laterality Date  . TOOTH EXTRACTION  12/2014   lower left   Family History  Problem Relation Age of Onset  . Heart disease Father        defibrilator  . Hyperlipidemia Father   . Hypertension Father   . Alzheimer's disease Father   . Other Father        stents in legs  . Aneurysm Mother        brain  . Hypertension Brother   . Stroke Brother        X2   Social History   Socioeconomic History  . Marital status: Divorced    Spouse  name: Not on file  . Number of children: 0  . Years of education: Not on file  . Highest education level: Not on file  Occupational History  . Occupation: disabled  Tobacco Use  . Smoking status: Former Smoker    Packs/day: 1.00    Years: 1.00    Pack years: 1.00    Types: Cigarettes  . Smokeless tobacco: Never Used  . Tobacco comment: states he only tried as a teenager  Vaping Use  . Vaping Use: Never used  Substance and Sexual Activity  . Alcohol use: No  . Drug use: No  . Sexual activity: Not Currently  Other Topics Concern  . Not on file  Social History Narrative   03/16/18: Lives alone with dog, Glenard Haring in apartment. Wife of 16 years left him about 2 years ago, and pt. has had difficulty adjusting.   Says his brother Francee Piccolo, who lives in [place], is his main contact, source of support, as  well as his dog.    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 States he uses painting as additional therapy.    Social Determinants of Health   Financial Resource Strain: Low Risk   . Difficulty of Paying Living Expenses: Not hard at all  Food Insecurity: No Food Insecurity  . Worried About Charity fundraiser in the Last Year: Never true  . Ran Out of Food in the Last Year: Never true  Transportation Needs: No Transportation Needs  . Lack of Transportation (Medical): No  . Lack of Transportation (Non-Medical): No  Physical Activity: Inactive  . Days of Exercise per Week: 0 days  . Minutes of Exercise per Session: 0 min  Stress: No Stress Concern Present  . Feeling of Stress : Not at all  Social Connections: Socially Isolated  . Frequency of Communication with Friends and Family: Never  . Frequency of Social Gatherings with Friends and Family: Never  . Attends Religious Services: More than 4 times per year  . Active Member of Clubs or Organizations: No  . Attends Archivist Meetings: Never  . Marital Status: Divorced    Tobacco Counseling Counseling given: Not Answered Comment: states he only tried as a teenager   Clinical Intake:  Pre-visit preparation completed: Yes  Pain : No/denies pain     Nutritional Risks: None Diabetes: No  How often do you need to have someone help you when you read instructions, pamphlets, or other written materials from your doctor or pharmacy?: 1 - Never What is the last grade level you completed in school?: 3rd grade  Diabetic?No   Interpreter Needed?: No  Information entered by :: [place] of Daily Living In your present state of health, do you have any difficulty performing the following activities: 04/05/2020  Hearing? Y  Vision? N  Difficulty concentrating or making decisions? N  Walking or climbing stairs? N   Dressing or bathing? N  Doing errands, shopping? N  Preparing Food and eating ? N  Using the Toilet? N  In the past six months, have you accidently leaked urine? N  Do you have problems with loss of bowel control? N  Managing your Medications? N  Managing your Finances? N  Housekeeping or managing your Housekeeping? N  Some recent data might be hidden    Patient Care Team: Martinique, Betty G, MD as PCP - General (Family Medicine) Minus Breeding, MD as PCP -  Cardiology (Cardiology) Constance Haw, MD as PCP - Electrophysiology (Cardiology) Norma Fredrickson, MD as Consulting Physician (Psychiatry) Minus Breeding, MD as Consulting Physician (Cardiology) Phylliss Bob, MD as Consulting Physician (Orthopedic Surgery) Kathrynn Ducking, MD as Consulting Physician (Neurology)  Indicate any recent Medical Services you may have received from other than [facility] providers in the past year (date may be approximate).     Assessment:   This is a routine wellness examination for Steven Vincent.  Hearing/Vision screen  Hearing Screening   125Hz  250Hz  500Hz  1000Hz  2000Hz  3000Hz  4000Hz  6000Hz  8000Hz   Right ear:           Left ear:           Vision Screening Comments: Patient states gets eyes examined once per year. Has bilateral cataracts not ready for extraction   Dietary issues and exercise activities discussed: Current Exercise Habits: The patient has a physically strenuous job, but has no regular exercise apart from work.  Goals    . Exercise 150 min/wk Moderate Activity     When you back is resolved, please walk at least 30 minutes x 2 per day     . Patient Stated     Improve sleep with CPAP, secure affordable housing      Depression Screen PHQ 2/9 Scores 04/05/2020 03/17/2019 03/16/2018 03/04/2017  PHQ - 2 Score 0 0 0 0  PHQ- 9 Score - - 7 -    Fall Risk Fall Risk  04/05/2020 03/20/2019 03/16/2018 03/04/2017  Falls in the past year? 1 0 0 No  Number falls in past yr: 0 0 - -  Injury with  Fall? 0 0 - -  Risk for fall due to : No Fall Risks Orthopedic patient - -  Follow up Falls evaluation completed;Falls prevention discussed Education provided - -    FALL RISK PREVENTION PERTAINING TO THE HOME:  Any stairs in or around the home? Yes  If so, are there any without handrails? No  Home free of loose throw rugs in walkways, pet beds, electrical cords, etc? Yes  Adequate lighting in your home to reduce risk of falls? Yes   ASSISTIVE DEVICES UTILIZED TO PREVENT FALLS:  Life alert? No  Use of a cane, walker or w/c? No  Grab bars in the bathroom? No  Shower chair or bench in shower? No  Elevated toilet seat or a handicapped toilet? No   TIMED UP AND GO:  Was the test performed? Yes .  Length of time to ambulate 10 feet: 2 sec.   Gait steady and fast without use of assistive device  Cognitive Function:   Normal cognitive status assessed by direct observation by this Nurse Health Advisor. No abnormalities found.        Immunizations Immunization History  Administered Date(s) Administered  . Influenza Split 12/03/2010  . Influenza Whole 12/04/2006, 11/17/2007, 01/11/2009  . Influenza,inj,Quad PF,6+ Mos 10/25/2018  . Td 12/04/2006    TDAP status: Due, Education has been provided regarding the importance of this vaccine. Advised may receive this vaccine at local pharmacy or Health Dept. Aware to provide a copy of the vaccination record if obtained from local pharmacy or Health Dept. Verbalized acceptance and understanding.  Flu Vaccine status: Declined, Education has been provided regarding the importance of this vaccine but patient still declined. Advised may receive this vaccine at local pharmacy or Health Dept. Aware to provide a copy of the vaccination record if obtained from local pharmacy or Health Dept. Verbalized acceptance and understanding.  Pneumococcal vaccine status: Up to date  Covid-19 vaccine status: Declined, Education has been provided regarding  the importance of this vaccine but patient still declined. Advised may receive this vaccine at local pharmacy or Health Dept.or vaccine clinic. Aware to provide a copy of the vaccination record if obtained from local pharmacy or Health Dept. Verbalized acceptance and understanding.  Qualifies for Shingles Vaccine? Yes   Zostavax completed No   Shingrix Completed?: No.    Education has been provided regarding the importance of this vaccine. Patient has been advised to call insurance company to determine out of pocket expense if they have not yet received this vaccine. Advised may also receive vaccine at local pharmacy or Health Dept. Verbalized acceptance and understanding.  Screening Tests Health Maintenance  Topic Date Due  . Hepatitis C Screening  Never done  . COVID-19 Vaccine (1) Never done  . TETANUS/TDAP  12/03/2016  . INFLUENZA VACCINE  09/26/2019  . HIV Screening  03/17/2023 (Originally 08/06/1980)  . COLONOSCOPY (Pts 45-39yrs Insurance coverage will need to be confirmed)  09/20/2025    Health Maintenance  Health Maintenance Due  Topic Date Due  . Hepatitis C Screening  Never done  . COVID-19 Vaccine (1) Never done  . TETANUS/TDAP  12/03/2016  . INFLUENZA VACCINE  09/26/2019    Colorectal cancer screening: Type of screening: Colonoscopy. Completed 09/21/2015. Repeat every 10 years  Lung Cancer Screening: (Low Dose CT Chest recommended if Age 101-80 years, 30 pack-year currently smoking OR have quit w/in 15years.) does not qualify.   Lung Cancer Screening Referral: N/A  Additional Screening:  Hepatitis C Screening: does qualify;   Vision Screening: Recommended annual ophthalmology exams for early detection of glaucoma and other disorders of the eye. Is the patient up to date with their annual eye exam?  Yes  Who is the provider or what is the name of the office in which the patient attends annual eye exams? [facility]  If pt is not established with a provider, would  they like to be referred to a provider to establish care? No .   Dental Screening: Recommended annual dental exams for proper oral hygiene  Community Resource Referral / Chronic Care Management: CRR required this visit?  No   CCM required this visit?  No      Plan:     I have personally reviewed and noted the following in the patient's chart:   . Medical and social history . Use of alcohol, tobacco or illicit drugs  . Current medications and supplements . Functional ability and status . Nutritional status . Physical activity . Advanced directives . List of other physicians . Hospitalizations, surgeries, and ER visits in previous 12 months . Vitals . Screenings to include cognitive, depression, and falls . Referrals and appointments  In addition, I have reviewed and discussed with patient certain preventive protocols, quality metrics, and best practice recommendations. A written personalized care plan for preventive services as well as general preventive health recommendations were provided to patient.     Ofilia Neas, LPN   10/31/930   Nurse Notes: None

## 2020-04-11 ENCOUNTER — Telehealth: Payer: Self-pay | Admitting: Family Medicine

## 2020-04-11 NOTE — Telephone Encounter (Signed)
I spoke with pt. He will come by and pick up his SCAT form before he goes to work. Paperwork signed by PCP, copy sent to scan.

## 2020-04-17 ENCOUNTER — Ambulatory Visit (INDEPENDENT_AMBULATORY_CARE_PROVIDER_SITE_OTHER): Payer: Medicare HMO | Admitting: Psychology

## 2020-04-17 DIAGNOSIS — F79 Unspecified intellectual disabilities: Secondary | ICD-10-CM | POA: Diagnosis not present

## 2020-04-17 DIAGNOSIS — F4321 Adjustment disorder with depressed mood: Secondary | ICD-10-CM

## 2020-04-17 NOTE — Progress Notes (Deleted)
PATIENT: Steven Vincent DOB: [birth year]  REASON FOR VISIT: follow up HISTORY FROM: patient  HISTORY OF PRESENT ILLNESS: Today 04/17/20 [name] is a 55 year old male with history of migraine headache and seizures.  Migraines are well controlled with Aimovig and nortriptyline.  He has Imitrex injection for acute headache.  Seizures are well controlled with Topamax.  HISTORY JM: Today, 07/14/2019, [name] returns for follow-up regarding migraine headache and seizures.  Migraine headaches have been well controlled with use of Aimovig and nortriptyline.  Imitrex injection for emergent use as needed. He reports last migraine in April but otherwise has not had one in a while.  Denies recurrent seizure activity with ongoing use of Topamax. He does report moving to [place] at the end of the month. He does plan on establishing care with PCP and neurology in [place]. No concerns at this time.      REVIEW OF SYSTEMS: Out of a complete 14 system review of symptoms, the patient complains only of the following symptoms, and all other reviewed systems are negative.  ALLERGIES: Allergies  Allergen Reactions  . Delsym [Dextromethorphan] Other (See Comments)    Gets loopy/memory loss  . Aspirin     GI upset Other reaction(s): Vomiting  . Mucinex D [Pseudoephedrine-Guaifenesin Er] Hypertension    HOME MEDICATIONS: Outpatient Medications Prior to Visit  Medication Sig Dispense Refill  . dronedarone (MULTAQ) 400 MG tablet Take 1 tablet (400 mg total) by mouth 2 (two) times daily with a meal. 180 tablet 3  . Erenumab-aooe (AIMOVIG) 140 MG/ML SOAJ Inject 140 mg into the skin every 30 (thirty) days. 3 pen 0  . meloxicam (MOBIC) 15 MG tablet TAKE 1 TABLET (15 MG TOTAL) BY MOUTH DAILY. IF NEEDED FOR PAIN. 90 tablet 0  . nortriptyline (PAMELOR) 50 MG capsule TAKE 2 CAPSULES AT BEDTIME 180 capsule 2  . pantoprazole (PROTONIX) 40 MG tablet Take 1 tablet by mouth daily.    . pravastatin (PRAVACHOL)  20 MG tablet Take 1 tablet (20 mg total) by mouth daily. 90 tablet 3  . rOPINIRole (REQUIP) 0.25 MG tablet Take 1 tablet (0.25 mg total) by mouth at bedtime. 90 tablet 1  . SUMAtriptan 6 MG/0.5ML SOAJ INJECT 6 MG INTO THE SKIN DAILY AS NEEDED AT MIGRAINE ONSET. MAY REPEAT 1 TIME AFTER 1 HOUR IF NEEDED 0.5 mL 0  . topiramate (TOPAMAX) 50 MG tablet Take one tablet in the AM and two tablets in the PM. 270 tablet 3   No facility-administered medications prior to visit.    PAST MEDICAL HISTORY: Past Medical History:  Diagnosis Date  . Arthritis   . Baker's cyst   . Headache(784.0)   . Knee pain   . Low back pain   . Migraines   . OSA (obstructive sleep apnea) 04/10/2017  . Personality disorder ([place])   . Seizures ([place])     PAST SURGICAL HISTORY: Past Surgical History:  Procedure Laterality Date  . TOOTH EXTRACTION  12/2014   lower left    FAMILY HISTORY: Family History  Problem Relation Age of Onset  . Heart disease Father        defibrilator  . Hyperlipidemia Father   . Hypertension Father   . Alzheimer's disease Father   . Other Father        stents in legs  . Aneurysm Mother        brain  . Hypertension Brother   . Stroke Brother        X2  SOCIAL HISTORY: Social History   Socioeconomic History  . Marital status: Divorced    Spouse name: Not on file  . Number of children: 0  . Years of education: Not on file  . Highest education level: Not on file  Occupational History  . Occupation: disabled  Tobacco Use  . Smoking status: Former Smoker    Packs/day: 1.00    Years: 1.00    Pack years: 1.00    Types: Cigarettes  . Smokeless tobacco: Never Used  . Tobacco comment: states he only tried as a teenager  Vaping Use  . Vaping Use: Never used  Substance and Sexual Activity  . Alcohol use: No  . Drug use: No  . Sexual activity: Not Currently  Other Topics Concern  . Not on file  Social History Narrative   03/16/18: Lives alone with dog, Glenard Haring in  apartment. Wife of 16 years left him about 2 years ago, and pt. has had difficulty adjusting.   Says his brother Francee Piccolo, who lives in [place], is his main contact, source of support, as well as his dog.    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 States he uses painting as additional therapy.    Social Determinants of Health   Financial Resource Strain: Low Risk   . Difficulty of Paying Living Expenses: Not hard at all  Food Insecurity: No Food Insecurity  . Worried About Charity fundraiser in the Last Year: Never true  . Ran Out of Food in the Last Year: Never true  Transportation Needs: No Transportation Needs  . Lack of Transportation (Medical): No  . Lack of Transportation (Non-Medical): No  Physical Activity: Inactive  . Days of Exercise per Week: 0 days  . Minutes of Exercise per Session: 0 min  Stress: No Stress Concern Present  . Feeling of Stress : Not at all  Social Connections: Socially Isolated  . Frequency of Communication with Friends and Family: Never  . Frequency of Social Gatherings with Friends and Family: Never  . Attends Religious Services: More than 4 times per year  . Active Member of Clubs or Organizations: No  . Attends Archivist Meetings: Never  . Marital Status: Divorced  Human resources officer Violence: Not At Risk  . Fear of Current or Ex-Partner: No  . Emotionally Abused: No  . Physically Abused: No  . Sexually Abused: No      PHYSICAL EXAM  There were no vitals filed for this visit. There is no height or weight on file to calculate BMI.  Generalized: Well developed, in no acute distress   Neurological examination  Mentation: Alert oriented to time, place, history taking. Follows all commands speech and language fluent Cranial nerve II-XII: Pupils were equal round reactive to light. Extraocular movements were full, visual field were full on  confrontational test. Facial sensation and strength were normal. Uvula tongue midline. Head turning and shoulder shrug  were normal and symmetric. Motor: The motor testing reveals 5 over 5 strength of all 4 extremities. Good symmetric motor tone is noted throughout.  Sensory: Sensory testing is intact to soft touch on all 4 extremities. No evidence of extinction is noted.  Coordination: Cerebellar testing reveals good finger-nose-finger and heel-to-shin bilaterally.  Gait and station: Gait is normal. Tandem gait is normal. Romberg is negative. No drift is seen.  Reflexes: Deep tendon reflexes are symmetric and normal bilaterally.   DIAGNOSTIC DATA (LABS,  IMAGING, TESTING) - I reviewed patient records, labs, notes, testing and imaging myself where available.  Lab Results  Component Value Date   WBC 7.1 01/13/2020   HGB 14.4 01/13/2020   HCT 40.4 01/13/2020   MCV 91.4 01/13/2020   PLT 161 01/13/2020      Component Value Date/Time   NA 140 01/13/2020 1420   NA 141 04/19/2019 1526   K 4.0 01/13/2020 1420   CL 111 01/13/2020 1420   CO2 21 (L) 01/13/2020 1420   GLUCOSE 89 01/13/2020 1420   BUN 19 01/13/2020 1420   BUN 18 04/19/2019 1526   CREATININE 0.89 01/13/2020 1420   CALCIUM 8.8 (L) 01/13/2020 1420   PROT 7.3 07/07/2018 0833   PROT 7.0 01/19/2014 1041   ALBUMIN 4.5 07/07/2018 0833   ALBUMIN 4.7 01/19/2014 1041   AST 24 07/07/2018 0833   ALT 25 07/07/2018 0833   ALKPHOS 71 07/07/2018 0833   BILITOT 0.5 07/07/2018 0833   GFRNONAA >60 01/13/2020 1420   GFRAA 90 04/19/2019 1526   Lab Results  Component Value Date   CHOL 124 07/07/2018   HDL 28.90 (L) 07/07/2018   LDLCALC 74 10/16/2016   LDLDIRECT 70.0 07/07/2018   TRIG 228.0 (H) 07/07/2018   CHOLHDL 4 07/07/2018   Lab Results  Component Value Date   HGBA1C 4.9 09/21/2018   Lab Results  Component Value Date   NOIBBCWU88 916 10/16/2016   Lab Results  Component Value Date   TSH 3.04 02/09/2020      ASSESSMENT  AND PLAN 55 y.o. year old male  has a past medical history of Arthritis, Baker's cyst, Headache(784.0), Knee pain, Low back pain, Migraines, OSA (obstructive sleep apnea) (04/10/2017), Personality disorder ([place]), and Seizures ([place]). here with ***   I spent 15 minutes with the patient. 50% of this time was spent   Butler Denmark, [place], DNP 04/17/2020, 3:35 PM [facility] [address] [place], [place] [address] [phone]

## 2020-04-18 ENCOUNTER — Encounter: Payer: Self-pay | Admitting: Neurology

## 2020-04-18 ENCOUNTER — Ambulatory Visit: Payer: Medicare HMO | Admitting: Neurology

## 2020-04-21 ENCOUNTER — Telehealth: Payer: Self-pay | Admitting: Family Medicine

## 2020-04-21 NOTE — Progress Notes (Signed)
  Chronic Care Management   Outreach Note  04/21/2020 Name: Steven Vincent MRN: [id number] DOB: 1966/01/30  Referred by: [place], Betty G, MD Reason for referral : No chief complaint on file.   An unsuccessful telephone outreach was attempted today. The patient was referred to the pharmacist for assistance with care management and care coordination.   Follow Up Plan:   [name]

## 2020-04-25 NOTE — Progress Notes (Signed)
PATIENT: Steven Vincent DOB: [birth year]  REASON FOR VISIT: follow up HISTORY FROM: patient  HISTORY OF PRESENT ILLNESS: Today 04/26/20 Steven Vincent is a 55 year old male with history of migraine headache and seizures.  Migraines are well controlled with Aimovig and nortriptyline.  He has Imitrex injection for acute headache, hasn't needed since he started the Aimovig.  Seizures are well controlled with Topamax. He moved to [place]. He is on CPAP for OSA, isn't using it, claims he can't breathe. Marland KitchenHe is working at [employer]. Has a bad left knee. Lives with an 20 year old woman friend, this is stressful for him, he has no other place to go. He doesn't sleep well because his bed is in storage, working on getting a place to live of his own in [place]. He has an emotional support dog. He drives a car.  Here today for evaluation unaccompanied.  HISTORY JM: Today, 07/14/2019, Steven Vincent returns for follow-up regarding migraine headache and seizures.  Migraine headaches have been well controlled with use of Aimovig and nortriptyline.  Imitrex injection for emergent use as needed. He reports last migraine in April but otherwise has not had one in a while.  Denies recurrent seizure activity with ongoing use of Topamax. He does report moving to [place] at the end of the month. He does plan on establishing care with PCP and neurology in [place]. No concerns at this time.      REVIEW OF SYSTEMS: Out of a complete 14 system review of symptoms, the patient complains only of the following symptoms, and all other reviewed systems are negative.  See HPI  ALLERGIES: Allergies  Allergen Reactions  . Delsym [Dextromethorphan] Other (See Comments)    Gets loopy/memory loss  . Aspirin     GI upset Other reaction(s): Vomiting  . Mucinex D [Pseudoephedrine-Guaifenesin Er] Hypertension    HOME MEDICATIONS: Outpatient Medications Prior to Visit  Medication Sig Dispense Refill  .  dronedarone (MULTAQ) 400 MG tablet Take 1 tablet (400 mg total) by mouth 2 (two) times daily with a meal. 180 tablet 3  . Erenumab-aooe (AIMOVIG) 140 MG/ML SOAJ Inject 140 mg into the skin every 30 (thirty) days. 3 pen 0  . meloxicam (MOBIC) 15 MG tablet TAKE 1 TABLET (15 MG TOTAL) BY MOUTH DAILY. IF NEEDED FOR PAIN. 90 tablet 0  . nortriptyline (PAMELOR) 50 MG capsule TAKE 2 CAPSULES AT BEDTIME 180 capsule 2  . pantoprazole (PROTONIX) 40 MG tablet Take 1 tablet by mouth daily.    . pravastatin (PRAVACHOL) 20 MG tablet Take 1 tablet (20 mg total) by mouth daily. 90 tablet 3  . rOPINIRole (REQUIP) 0.25 MG tablet Take 1 tablet (0.25 mg total) by mouth at bedtime. 90 tablet 1  . SUMAtriptan 6 MG/0.5ML SOAJ INJECT 6 MG INTO THE SKIN DAILY AS NEEDED AT MIGRAINE ONSET. MAY REPEAT 1 TIME AFTER 1 HOUR IF NEEDED 0.5 mL 0  . topiramate (TOPAMAX) 50 MG tablet Take one tablet in the AM and two tablets in the PM. 270 tablet 3   No facility-administered medications prior to visit.    PAST MEDICAL HISTORY: Past Medical History:  Diagnosis Date  . Arthritis   . Baker's cyst   . Headache(784.0)   . Knee pain   . Low back pain   . Migraines   . OSA (obstructive sleep apnea) 04/10/2017  . Personality disorder ([place])   . Seizures ([place])     PAST SURGICAL HISTORY: Past Surgical History:  Procedure  Laterality Date  . TOOTH EXTRACTION  12/2014   lower left    FAMILY HISTORY: Family History  Problem Relation Age of Onset  . Heart disease Father        defibrilator  . Hyperlipidemia Father   . Hypertension Father   . Alzheimer's disease Father   . Other Father        stents in legs  . Aneurysm Mother        brain  . Hypertension Brother   . Stroke Brother        X2    SOCIAL HISTORY: Social History   Socioeconomic History  . Marital status: Divorced    Spouse name: Not on file  . Number of children: 0  . Years of education: Not on file  . Highest education level: Not on file   Occupational History  . Occupation: disabled  Tobacco Use  . Smoking status: Former Smoker    Packs/day: 1.00    Years: 1.00    Pack years: 1.00    Types: Cigarettes  . Smokeless tobacco: Never Used  . Tobacco comment: states he only tried as a teenager  Vaping Use  . Vaping Use: Never used  Substance and Sexual Activity  . Alcohol use: No  . Drug use: No  . Sexual activity: Not Currently  Other Topics Concern  . Not on file  Social History Narrative   03/16/18: Lives alone with dog, Glenard Haring in apartment. Wife of 16 years left him about 2 years ago, and pt. has had difficulty adjusting.   Says his brother Francee Piccolo, who lives in [place], is his main contact, source of support, as well as his dog.    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 States he uses painting as additional therapy.    Social Determinants of Health   Financial Resource Strain: Low Risk   . Difficulty of Paying Living Expenses: Not hard at all  Food Insecurity: No Food Insecurity  . Worried About Charity fundraiser in the Last Year: Never true  . Ran Out of Food in the Last Year: Never true  Transportation Needs: No Transportation Needs  . Lack of Transportation (Medical): No  . Lack of Transportation (Non-Medical): No  Physical Activity: Inactive  . Days of Exercise per Week: 0 days  . Minutes of Exercise per Session: 0 min  Stress: No Stress Concern Present  . Feeling of Stress : Not at all  Social Connections: Socially Isolated  . Frequency of Communication with Friends and Family: Never  . Frequency of Social Gatherings with Friends and Family: Never  . Attends Religious Services: More than 4 times per year  . Active Member of Clubs or Organizations: No  . Attends Archivist Meetings: Never  . Marital Status: Divorced  Human resources officer Violence: Not At Risk  . Fear of Current or Ex-Partner: No  .  Emotionally Abused: No  . Physically Abused: No  . Sexually Abused: No   PHYSICAL EXAM  Vitals:   04/26/20 0824  BP: (!) 145/88  Pulse: 84  Weight: 270 lb (122.5 kg)  Height: 6\' 2"  (1.88 m)   Body mass index is 34.67 kg/m.  Generalized: Well developed, in no acute distress   Neurological examination  Mentation: Alert oriented to time, place, history taking. Follows all commands speech and language fluent Cranial nerve II-XII: Pupils were equal round reactive to light. Extraocular movements  were full, visual field were full on confrontational test. Facial sensation and strength were normal.  Head turning and shoulder shrug  were normal and symmetric. Motor: The motor testing reveals 5 over 5 strength of all 4 extremities. Good symmetric motor tone is noted throughout.  Sensory: Sensory testing is intact to soft touch on all 4 extremities. No evidence of extinction is noted.  Coordination: Cerebellar testing reveals good finger-nose-finger and heel-to-shin bilaterally.  Gait and station: Gait is normal.  Reflexes: Deep tendon reflexes are symmetric and normal bilaterally.   DIAGNOSTIC DATA (LABS, IMAGING, TESTING) - I reviewed patient records, labs, notes, testing and imaging myself where available.  Lab Results  Component Value Date   WBC 7.1 01/13/2020   HGB 14.4 01/13/2020   HCT 40.4 01/13/2020   MCV 91.4 01/13/2020   PLT 161 01/13/2020      Component Value Date/Time   NA 140 01/13/2020 1420   NA 141 04/19/2019 1526   K 4.0 01/13/2020 1420   CL 111 01/13/2020 1420   CO2 21 (L) 01/13/2020 1420   GLUCOSE 89 01/13/2020 1420   BUN 19 01/13/2020 1420   BUN 18 04/19/2019 1526   CREATININE 0.89 01/13/2020 1420   CALCIUM 8.8 (L) 01/13/2020 1420   PROT 7.3 07/07/2018 0833   PROT 7.0 01/19/2014 1041   ALBUMIN 4.5 07/07/2018 0833   ALBUMIN 4.7 01/19/2014 1041   AST 24 07/07/2018 0833   ALT 25 07/07/2018 0833   ALKPHOS 71 07/07/2018 0833   BILITOT 0.5 07/07/2018 0833    GFRNONAA >60 01/13/2020 1420   GFRAA 90 04/19/2019 1526   Lab Results  Component Value Date   CHOL 124 07/07/2018   HDL 28.90 (L) 07/07/2018   LDLCALC 74 10/16/2016   LDLDIRECT 70.0 07/07/2018   TRIG 228.0 (H) 07/07/2018   CHOLHDL 4 07/07/2018   Lab Results  Component Value Date   HGBA1C 4.9 09/21/2018   Lab Results  Component Value Date   ZPHXTAVW97 948 10/16/2016   Lab Results  Component Value Date   TSH 3.04 02/09/2020   ASSESSMENT AND PLAN 55 y.o. year old male  has a past medical history of Arthritis, Baker's cyst, Headache(784.0), Knee pain, Low back pain, Migraines, OSA (obstructive sleep apnea) (04/10/2017), Personality disorder ([place]), and Seizures ([place]). here with:  1.  Chronic migraine headaches 2.  Seizures, well controlled  Walter is doing well, we will continue current medications.  Discussed tapering off nortriptyline since migraines are so well controlled, however decided to remain in place, due to issues sleeping.  A lot of his issues are related to not using CPAP, also stress, unfamiliar bed in his current living situation.  He is working on establishing his own place.  Encouraged him to reach out to his CPAP provider about his difficulties with use. He will follow-up in 1 year or sooner if needed. He is excited his book about his experience in [employer] is coming out this year.   I spent 20 minutes of face-to-face and non-face-to-face time with patient.  This included previsit chart review, lab review, study review, order entry, electronic health record documentation, patient education.  Butler Denmark, AGNP-C, DNP 04/26/2020, 8:37 AM [facility] [address], [place], [place] [address] [phone]

## 2020-04-26 ENCOUNTER — Encounter: Payer: Self-pay | Admitting: Neurology

## 2020-04-26 ENCOUNTER — Ambulatory Visit (INDEPENDENT_AMBULATORY_CARE_PROVIDER_SITE_OTHER): Payer: Medicare HMO | Admitting: Neurology

## 2020-04-26 VITALS — BP 145/88 | HR 84 | Ht 74.0 in | Wt 270.0 lb

## 2020-04-26 DIAGNOSIS — G43009 Migraine without aura, not intractable, without status migrainosus: Secondary | ICD-10-CM

## 2020-04-26 DIAGNOSIS — R569 Unspecified convulsions: Secondary | ICD-10-CM | POA: Diagnosis not present

## 2020-04-26 MED ORDER — AIMOVIG 140 MG/ML ~~LOC~~ SOAJ
140.0000 mg | SUBCUTANEOUS | 3 refills | Status: DC
Start: 2020-04-26 — End: 2021-05-02

## 2020-04-26 MED ORDER — TOPIRAMATE 50 MG PO TABS: ORAL_TABLET | ORAL | 3 refills | Status: DC

## 2020-04-26 MED ORDER — NORTRIPTYLINE HCL 50 MG PO CAPS: 100.0000 mg | ORAL_CAPSULE | Freq: Every day | ORAL | 3 refills | Status: DC

## 2020-04-26 NOTE — Patient Instructions (Signed)
Continue current medications Need to check with CPAP doctor  See you back in 1 year

## 2020-04-26 NOTE — Progress Notes (Signed)
I have read the note, and I agree with the clinical assessment and plan.  Michah Minton K Terrilynn Postell   

## 2020-04-27 ENCOUNTER — Ambulatory Visit: Payer: Medicare HMO | Admitting: Psychology

## 2020-04-28 DIAGNOSIS — F819 Developmental disorder of scholastic skills, unspecified: Secondary | ICD-10-CM | POA: Diagnosis not present

## 2020-05-04 ENCOUNTER — Telehealth: Payer: Self-pay | Admitting: *Deleted

## 2020-05-04 NOTE — Telephone Encounter (Signed)
Received fax form [employer], [place] for Aimovig, G43.009, failed: metoprolol, already take topiramate for seizures. On NP's desk for signature. PA signed, faxed to [facility].

## 2020-05-05 ENCOUNTER — Telehealth: Payer: Self-pay | Admitting: Family Medicine

## 2020-05-05 NOTE — Progress Notes (Signed)
  Chronic Care Management   Outreach Note  05/05/2020 Name: Steven Vincent MRN: [id number] DOB: 1965/03/04  Referred by: Martinique, Betty G, MD Reason for referral : No chief complaint on file.   An unsuccessful telephone outreach was attempted today. The patient was referred to the pharmacist for assistance with care management and care coordination.   Follow Up Plan:   [name]

## 2020-05-08 ENCOUNTER — Telehealth: Payer: Self-pay | Admitting: Family Medicine

## 2020-05-08 DIAGNOSIS — M21372 Foot drop, left foot: Secondary | ICD-10-CM | POA: Diagnosis not present

## 2020-05-08 NOTE — Progress Notes (Signed)
  Chronic Care Management   Note  05/08/2020 Name: Steven Vincent MRN: [id number] DOB: 1965/12/24  Steven Vincent is a 55 y.o. year old male who is a primary care patient of Martinique, Malka So, MD. I reached out to Steven Vincent by phone today in response to a referral sent by [name]'s PCP, Martinique, Betty G, MD.   [name] about Chronic Care Management services today including:  1. CCM service includes personalized support from designated clinical staff supervised by his physician, including individualized plan of care and coordination with other care providers 2. 24/7 contact phone numbers for assistance for urgent and routine care needs. 3. Service will only be billed when office clinical staff spend 20 minutes or more in a month to coordinate care. 4. Only one practitioner may furnish and bill the service in a calendar month. 5. The patient may stop CCM services at any time (effective at the end of the month) by phone call to the office staff.   Patient agreed to services and verbal consent obtained.   Follow up plan:   [name]

## 2020-05-08 NOTE — Telephone Encounter (Signed)
Received fax approval for aimovig 140mg /ml thru 02-25-2021 [employer] ID [id number].  [phone].

## 2020-05-11 ENCOUNTER — Ambulatory Visit: Payer: Medicare HMO | Admitting: Psychology

## 2020-05-15 DIAGNOSIS — F4322 Adjustment disorder with anxiety: Secondary | ICD-10-CM | POA: Diagnosis not present

## 2020-05-15 DIAGNOSIS — F70 Mild intellectual disabilities: Secondary | ICD-10-CM | POA: Diagnosis not present

## 2020-05-31 DIAGNOSIS — F819 Developmental disorder of scholastic skills, unspecified: Secondary | ICD-10-CM | POA: Diagnosis not present

## 2020-06-08 DIAGNOSIS — M170 Bilateral primary osteoarthritis of knee: Secondary | ICD-10-CM | POA: Diagnosis not present

## 2020-06-10 ENCOUNTER — Other Ambulatory Visit: Payer: Self-pay | Admitting: Family Medicine

## 2020-06-10 ENCOUNTER — Other Ambulatory Visit: Payer: Self-pay | Admitting: Cardiology

## 2020-06-10 DIAGNOSIS — M5410 Radiculopathy, site unspecified: Secondary | ICD-10-CM

## 2020-06-16 ENCOUNTER — Other Ambulatory Visit: Payer: Self-pay | Admitting: Family Medicine

## 2020-06-16 DIAGNOSIS — G2581 Restless legs syndrome: Secondary | ICD-10-CM

## 2020-06-26 DIAGNOSIS — F2089 Other schizophrenia: Secondary | ICD-10-CM | POA: Diagnosis not present

## 2020-06-29 DIAGNOSIS — M21372 Foot drop, left foot: Secondary | ICD-10-CM | POA: Diagnosis not present

## 2020-07-11 DIAGNOSIS — H2513 Age-related nuclear cataract, bilateral: Secondary | ICD-10-CM | POA: Diagnosis not present

## 2020-07-12 DIAGNOSIS — F819 Developmental disorder of scholastic skills, unspecified: Secondary | ICD-10-CM | POA: Diagnosis not present

## 2020-07-14 DIAGNOSIS — M47816 Spondylosis without myelopathy or radiculopathy, lumbar region: Secondary | ICD-10-CM | POA: Diagnosis not present

## 2020-07-17 ENCOUNTER — Telehealth: Payer: Self-pay | Admitting: Pharmacist

## 2020-07-17 NOTE — Chronic Care Management (AMB) (Signed)
Chronic Care Management Pharmacy Assistant   Name: Steven Vincent  MRN: [id number] DOB: 1965/07/21  Steven Vincent is an 55 y.o. year old male who presents for his initial CCM visit with the clinical pharmacist.  Reason for Encounter: Chart Prep for initial visit with CPP on 07/20/20.   Conditions to be addressed/monitored: HLD and Schizotypal Personality Disorder, Migraines, Hypoglycemia and Insomnia.  Primary concerns for visit include:   Recent office visits:  02/09/20 Betty Martinique MD (PCP) - seen for abnormal TSH and fatigue. No medication changes. Follow up in 2 months for complete physical.   01/25/20 Betty Martinique MD (PCP) - seen for insomnia and other chronic conditions. No medication changes and follow up in 6 months.  Recent consult visits:  05/31/20 Benjiman Core (Psychotherapy) -  Seen for developmental disorder of scholastic skills. No medication changes or follow ups noted.   05/15/20 Rainey Pines [facility]) - seen for adjustment disorder with anxiety and mild intellectual disabilities. No medication changes or follow up noted.   05/08/20 Inocencio Homes MD (Podiatry) - seen for left foot drop. No medication changes or follow up noted.   04/28/20 Benjiman Core (Psychotherapy) -  Seen for developmental disorder of scholastic skills. No medication changes or follow ups noted.   04/26/20 Butler Denmark NP (Neurology) -  Seen for migraine without aura and without status migrainosus. No medication changes. Follow up in 1 year.   03/28/20 Benjiman Core (Psychotherapy) -  Seen for developmental disorder of scholastic skills. No medication changes or follow ups noted.   03/27/20 Norma Fredrickson (Psychiatry) -  Seen for schizophrenia. No medication changes or follow up noted.   03/08/20 Melrose Nakayama (Orthopedic Surgery) - seen for bilateral primary osteoarthritis of knee. Patient received injection of betamethasone into knee and draining of the bursa. No follow up noted.    02/14/20 Benjiman Core (Psychotherapy) -  Seen for developmental disorder of scholastic skills. No medication changes or follow ups noted.   01/28/20 Benjiman Core (Psychotherapy) -  Seen for schizophrenia. No medication changes or follow up noted.    Hospital visits:  Medication Reconciliation was completed by comparing discharge summary, patient's EMR and Pharmacy list, and upon discussion with patient.  Patient visited [facility] due to fatigue and irregular heart rate. Patient was there for 2 hours on 01/13/20.   New?Medications Started at [facility] Discharge:?? None.  Medication Changes at Hospital Discharge: None.   Medications Discontinued at Hospital Discharge: None.   Medications that remain the same after Hospital Discharge:??  -All other medications will remain the same.    Fill History: MULTAQ 400 MG PO TABS 03/16/2020 90   AIMOVIG 140 MG/ML [place] SOAJ 06/05/2020 30   meloxicam (MOBIC) tablet 04/03/2020 90   NORTRIPTYLINE HCL 50 MG CAP 01/13/2020 90   PANTOPRAZOLE SOD DR 40 MG TAB 01/15/2020 30   pravastatin 20 mg tablet 05/01/2019 90   sumatriptan 6 mg/0.5 mL subcutaneous pen injector 12/24/2019 30   topiramate (TOPAMAX) tablet 06/05/2020 90   rOPINIRole (REQUIP) tablet 04/04/2020 90     Medications: Outpatient Encounter Medications as of 07/17/2020  Medication Sig  . Erenumab-aooe (AIMOVIG) 140 MG/ML SOAJ Inject 140 mg into the skin every 30 (thirty) days.  . meloxicam (MOBIC) 15 MG tablet TAKE 1 TABLET DAILY. IF NEEDED FOR PAIN.  . MULTAQ 400 MG tablet TAKE 1 TABLET TWICE DAILY WITH MEALS  . nortriptyline (PAMELOR) 50 MG capsule Take 2 capsules (100 mg total) by mouth at  bedtime.  . pantoprazole (PROTONIX) 40 MG tablet Take 1 tablet by mouth daily.  . pravastatin (PRAVACHOL) 20 MG tablet Take 1 tablet (20 mg total) by mouth daily.  Marland Kitchen rOPINIRole (REQUIP) 0.25 MG tablet TAKE 1 TABLET (0.25 MG TOTAL) BY MOUTH AT BEDTIME.  .  SUMAtriptan 6 MG/0.5ML SOAJ INJECT 6 MG INTO THE SKIN DAILY AS NEEDED AT MIGRAINE ONSET. MAY REPEAT 1 TIME AFTER 1 HOUR IF NEEDED  . topiramate (TOPAMAX) 50 MG tablet Take one tablet in the AM and two tablets in the PM.   No facility-administered encounter medications on file as of 07/17/2020.    Have you seen any other providers since your last visit?   Any changes in your medications or health?   Any side effects from any medications?  Do you have an symptoms or problems not managed by your medications?   Any concerns about your health right now?   Has your provider asked that you check blood pressure, blood sugar, or follow special diet at home?   Do you get any type of exercise on a regular basis?   Can you think of a goal you would like to reach for your health?   Do you have any problems getting your medications?  Is there anything that you would like to discuss during the appointment?   Please bring medications and supplements to appointment  Unsuccessful at reaching patient by phone. Closing note out per Jeni Salles clinical pharmacist  Star Rating Drugs:   Pravastatin 20mg  - last filled on 05/01/19 90DS at [facility] Pharmacist Assistant [phone]

## 2020-07-18 DIAGNOSIS — Z0100 Encounter for examination of eyes and vision without abnormal findings: Secondary | ICD-10-CM | POA: Diagnosis not present

## 2020-07-18 NOTE — Telephone Encounter (Cosign Needed)
2 attempts.

## 2020-07-18 NOTE — Telephone Encounter (Signed)
Patient called back and stated that he will be here for his appointment on 05/26

## 2020-07-20 ENCOUNTER — Telehealth: Payer: Self-pay | Admitting: Cardiology

## 2020-07-20 ENCOUNTER — Ambulatory Visit (INDEPENDENT_AMBULATORY_CARE_PROVIDER_SITE_OTHER): Payer: Medicare HMO | Admitting: Pharmacist

## 2020-07-20 ENCOUNTER — Other Ambulatory Visit: Payer: Self-pay

## 2020-07-20 DIAGNOSIS — M1990 Unspecified osteoarthritis, unspecified site: Secondary | ICD-10-CM | POA: Diagnosis not present

## 2020-07-20 DIAGNOSIS — F609 Personality disorder, unspecified: Secondary | ICD-10-CM

## 2020-07-20 DIAGNOSIS — I493 Ventricular premature depolarization: Secondary | ICD-10-CM | POA: Diagnosis not present

## 2020-07-20 DIAGNOSIS — E782 Mixed hyperlipidemia: Secondary | ICD-10-CM | POA: Diagnosis not present

## 2020-07-20 DIAGNOSIS — G43909 Migraine, unspecified, not intractable, without status migrainosus: Secondary | ICD-10-CM

## 2020-07-20 DIAGNOSIS — G2581 Restless legs syndrome: Secondary | ICD-10-CM | POA: Diagnosis not present

## 2020-07-20 DIAGNOSIS — G4700 Insomnia, unspecified: Secondary | ICD-10-CM

## 2020-07-20 DIAGNOSIS — J309 Allergic rhinitis, unspecified: Secondary | ICD-10-CM

## 2020-07-20 NOTE — Progress Notes (Signed)
Chronic Care Management Pharmacy Note  07/20/2020 Name:  Steven Vincent MRN:  [id number] DOB:  [birth year]  Summary: Patient is nonadherent to medications due to memory concerns  Recommendations/Changes made from today's visit: -Recommend social work referral for assistance with housing -Recommended for patient to restart taking statin medication due to ASCVD benefits -Recommend referral for a new neurologist -Recommended PCP follow up to discuss memory concerns and possible new medication for sleep  Plan: -Follow up on cost of medications through alternative pharmacy or other adherence suggestions  Subjective: Steven Vincent is an 55 y.o. year old male who is a primary patient of Martinique, Malka So, MD.  The CCM team was consulted for assistance with disease management and care coordination needs.    Engaged with patient face to face for initial visit in response to provider referral for pharmacy case management and/or care coordination services.   Consent to Services:  The patient was given the following information about Chronic Care Management services today, agreed to services, and gave verbal consent: 1. CCM service includes personalized support from designated clinical staff supervised by the primary care provider, including individualized plan of care and coordination with other care providers 2. 24/7 contact phone numbers for assistance for urgent and routine care needs. 3. Service will only be billed when office clinical staff spend 20 minutes or more in a month to coordinate care. 4. Only one practitioner may furnish and bill the service in a calendar month. 5.The patient may stop CCM services at any time (effective at the end of the month) by phone call to the office staff. 6. The patient will be responsible for cost sharing (co-pay) of up to 20% of the service fee (after annual deductible is met). Patient agreed to services and consent obtained.  Patient Care  Team: Martinique, Betty G, MD as PCP - General (Family Medicine) Minus Breeding, MD as PCP - Cardiology (Cardiology) Constance Haw, MD as PCP - Electrophysiology (Cardiology) Norma Fredrickson, MD as Consulting Physician (Psychiatry) Minus Breeding, MD as Consulting Physician (Cardiology) Phylliss Bob, MD as Consulting Physician (Orthopedic Surgery) Kathrynn Ducking, MD as Consulting Physician (Neurology) Viona Gilmore, [facility] as Pharmacist (Pharmacist)  Recent office visits: 02/09/20 Betty Martinique MD (PCP) - seen for abnormal TSH and fatigue. No medication changes. Follow up in 2 months for complete physical.    01/25/20 Betty Martinique MD (PCP) - seen for insomnia and other chronic conditions. No medication changes and follow up in 6 months.  Recent consult visits: 05/31/20 Benjiman Core (Psychotherapy) -  Seen for developmental disorder of scholastic skills. No medication changes or follow ups noted.    05/15/20 Rainey Pines [facility]) - seen for adjustment disorder with anxiety and mild intellectual disabilities. No medication changes or follow up noted.    05/08/20 Inocencio Homes MD (Podiatry) - seen for left foot drop. No medication changes or follow up noted.    04/28/20 Benjiman Core (Psychotherapy) -  Seen for developmental disorder of scholastic skills. No medication changes or follow ups noted.    04/26/20 Butler Denmark NP (Neurology) -  Seen for migraine without aura and without status migrainosus. No medication changes. Follow up in 1 year.    03/28/20 Benjiman Core (Psychotherapy) -  Seen for developmental disorder of scholastic skills. No medication changes or follow ups noted.    03/27/20 Norma Fredrickson (Psychiatry) -  Seen for schizophrenia. No medication changes or follow up noted.    03/08/20 Melrose Nakayama (Orthopedic Surgery) -  seen for bilateral primary osteoarthritis of knee. Patient received injection of betamethasone into knee and draining of the bursa. No  follow up noted.    02/14/20 Benjiman Core (Psychotherapy) -  Seen for developmental disorder of scholastic skills. No medication changes or follow ups noted.    01/28/20 Benjiman Core (Psychotherapy) -  Seen for schizophrenia. No medication changes or follow up noted.   Hospital visits: Medication Reconciliation was completed by comparing discharge summary, patient's EMR and Pharmacy list, and upon discussion with patient.   Patient visited [facility] due to fatigue and irregular heart rate. Patient was there for 2 hours on 01/13/20.    New?Medications Started at [facility] Discharge:?? None.   Medication Changes at Hospital Discharge: None.    Objective:  Lab Results  Component Value Date   CREATININE 0.89 01/13/2020   BUN 19 01/13/2020   GFR 86.33 09/30/2017   GFRNONAA >60 01/13/2020   GFRAA 90 04/19/2019   NA 140 01/13/2020   K 4.0 01/13/2020   CALCIUM 8.8 (L) 01/13/2020   CO2 21 (L) 01/13/2020   GLUCOSE 89 01/13/2020    Lab Results  Component Value Date/Time   HGBA1C 4.9 09/21/2018 03:51 PM   GFR 86.33 09/30/2017 10:19 AM   GFR 83.66 10/16/2016 09:00 AM    Last diabetic Eye exam: No results found for: HMDIABEYEEXA  Last diabetic Foot exam: No results found for: HMDIABFOOTEX   Lab Results  Component Value Date   CHOL 124 07/07/2018   HDL 28.90 (L) 07/07/2018   LDLCALC 74 10/16/2016   LDLDIRECT 70.0 07/07/2018   TRIG 228.0 (H) 07/07/2018   CHOLHDL 4 07/07/2018    Hepatic Function Latest Ref Rng & Units 07/07/2018 02/21/2016 01/19/2014  Total Protein 6.0 - 8.3 g/dL 7.3 6.3 7.0  Albumin 3.5 - 5.2 g/dL 4.5 4.1 4.7  AST 0 - 37 U/L 24 21 70(H)  ALT 0 - 53 U/L 25 21 47(H)  Alk Phosphatase 39 - 117 U/L 71 78 83  Total Bilirubin 0.2 - 1.2 mg/dL 0.5 0.4 0.7  Bilirubin, Direct 0.0 - 0.3 mg/dL 0.1 - -    Lab Results  Component Value Date/Time   TSH 3.04 02/09/2020 03:11 PM   TSH 5.86 (H) 01/25/2020 10:35 AM   FREET4 1.5 02/09/2020 03:11 PM    FREET4 1.07 03/17/2017 11:02 AM    CBC Latest Ref Rng & Units 01/13/2020 05/04/2018 02/21/2016  WBC 4.0 - 10.5 K/uL 7.1 6.6 7.5  Hemoglobin 13.0 - 17.0 g/dL 14.4 13.9 14.7  Hematocrit 39.0 - 52.0 % 40.4 42.2 42.1  Platelets 150 - 400 K/uL 161 161 162.0    Lab Results  Component Value Date/Time   VD25OH 45.27 10/16/2016 09:00 AM    Clinical ASCVD: No  The ASCVD Risk score (Goff DC Jr., et al., 2013) failed to calculate for the following reasons:   The valid total cholesterol range is 130 to 320 mg/dL    Depression screen [facility] 2/9 04/05/2020 03/17/2019 03/16/2018  Decreased Interest 0 0 0  Down, Depressed, Hopeless 0 0 0  PHQ - 2 Score 0 0 0  Altered sleeping - - 3  Tired, decreased energy - - 3  Change in appetite - - 1  Feeling bad or failure about yourself  - - 0  Trouble concentrating - - 0  Moving slowly or fidgety/restless - - 0  Suicidal thoughts - - 0  PHQ-9 Score - - 7  Difficult doing work/chores - - Somewhat difficult  Some  recent data might be hidden      Social History   Tobacco Use  Smoking Status Former   Packs/day: 1.00   Years: 1.00   Pack years: 1.00   Types: Cigarettes  Smokeless Tobacco Never  Tobacco Comments   states he only tried as a teenager   BP Readings from Last 3 Encounters:  04/26/20 (!) 145/88  04/05/20 130/82  02/09/20 118/82   Pulse Readings from Last 3 Encounters:  04/26/20 84  04/05/20 85  02/09/20 84   Wt Readings from Last 3 Encounters:  04/26/20 270 lb (122.5 kg)  04/05/20 262 lb 6 oz (119 kg)  02/09/20 257 lb (116.6 kg)   BMI Readings from Last 3 Encounters:  04/26/20 34.67 kg/m  04/05/20 35.58 kg/m  02/09/20 34.86 kg/m    Assessment/Interventions: Review of patient past medical history, allergies, medications, health status, including review of consultants reports, laboratory and other test data, was performed as part of comprehensive evaluation and provision of chronic care management services.   SDOH:   (Social Determinants of Health) assessments and interventions performed: Yes SDOH Interventions    Flowsheet Row Most Recent Value  SDOH Interventions   Financial Strain Interventions Intervention Not Indicated  Transportation Interventions Intervention Not Indicated      SDOH Screenings   Alcohol Screen: Low Risk    Last Alcohol Screening Score (AUDIT): 0  Depression (PHQ2-9): Low Risk    PHQ-2 Score: 0  Financial Resource Strain: Low Risk    Difficulty of Paying Living Expenses: Not hard at all  Food Insecurity: No Food Insecurity   Worried About Charity fundraiser in the Last Year: Never true   Ran Out of Food in the Last Year: Never true  Housing: Low Risk    Last Housing Risk Score: 0  Physical Activity: Inactive   Days of Exercise per Week: 0 days   Minutes of Exercise per Session: 0 min  Social Connections: Socially Isolated   Frequency of Communication with Friends and Family: Never   Frequency of Social Gatherings with Friends and Family: Never   Attends Religious Services: More than 4 times per year   Active Member of [employer] or Organizations: No   Attends Music therapist: Never   Marital Status: Divorced  Stress: No Stress Concern Present   Feeling of Stress : Not at all  Tobacco Use: Medium Risk   Smoking Tobacco Use: Former   Smokeless Tobacco Use: Never  Transportation Needs: No Data processing manager (Medical): No   Lack of Transportation (Non-Medical): No   Patient reports his biggest concern right now is with his memory and his friend Olen Cordial, over the phone, confirmed this. He has the same conversations almost word for word over and over again and he has not been always taking his medications all the time. He also doesn't always remember what day it is. This has been happening more drastically over the last 3 months.  His friend Olen Cordial is there for him when he needs it and he has signed everything over to him. They have been  friends for 3 years now and he has helped him out a lot.  He has not noticed much improvement in his sleep with the nortriptyline. He has trouble falling asleep and has cut back on caffiene but his mind continues to run.   He still works at [employer] and he works 5 days a week for 4 hours a day.  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  He mostly eats whatever he wants but tries to stay away from fast food and limits potato chips. He loves fruit, tries to eat lots of vegetables and eats a variety of meat, mostly chicken, ground pork, and some hamburger mixed with ground chicken. He does not eat restaurant food.  He is now taking 2 of the ropinirole every night because one wasn't working and he is struggling to remember to take his medications. Right now, he is working on trying to find a place to go because his current place is too small. He cannot fit his CPAP machine and he had a prescription for a bed but he couldn't use it because it was too big.   CCM Care Plan  Allergies  Allergen Reactions   Delsym [Dextromethorphan] Other (See Comments)    Gets loopy/memory loss   Aspirin     GI upset Other reaction(s): Vomiting   Mucinex D [Pseudoephedrine-Guaifenesin Er] Hypertension    Medications Reviewed Today     Reviewed by Viona Gilmore, [facility] (Pharmacist) on 07/20/20 at [phone]  Med List Status: <None>   Medication Order Taking? Sig Documenting Provider Last Dose Status Informant  Erenumab-aooe (AIMOVIG) 140 MG/ML SOAJ [id number] Yes Inject 140 mg into the skin every 30 (thirty) days. Suzzanne Cloud, NP Taking Active   meloxicam (MOBIC) 15 MG tablet [id number] Yes TAKE 1 TABLET DAILY. IF NEEDED FOR PAIN. Martinique, Betty G, MD Taking Active   MULTAQ 400 MG tablet [id number] Yes TAKE 1 TABLET TWICE DAILY WITH MEALS Minus Breeding, MD Taking Active   nortriptyline (PAMELOR) 50 MG capsule [id number] Yes Take 2  capsules (100 mg total) by mouth at bedtime. Suzzanne Cloud, NP Taking Active   pravastatin (PRAVACHOL) 20 MG tablet [id number] No Take 1 tablet (20 mg total) by mouth daily.  Patient not taking: Reported on 07/20/2020   Minus Breeding, MD Not Taking Active   rOPINIRole (REQUIP) 0.25 MG tablet [id number] Yes TAKE 1 TABLET (0.25 MG TOTAL) BY MOUTH AT BEDTIME. Martinique, Betty G, MD Taking Active   SUMAtriptan 6 MG/0.5ML Darden Palmer [id number] Yes INJECT 6 MG INTO THE SKIN DAILY AS NEEDED AT MIGRAINE ONSET. MAY REPEAT 1 TIME AFTER 1 HOUR IF NEEDED McCue, Jessica, NP Taking Active   topiramate (TOPAMAX) 50 MG tablet [id number] Yes Take one tablet in the AM and two tablets in the PM. Suzzanne Cloud, NP Taking Active             Patient Active Problem List   Diagnosis Date Noted   Educated about COVID-19 virus infection 07/22/2019   Generalized osteoarthritis of multiple sites 04/19/2019   Fatigue 09/21/2018   ED (erectile dysfunction) 07/06/2018   Allergic rhinitis 03/09/2018   Insomnia 03/06/2018   DJD (degenerative joint disease), lumbosacral 06/19/2017   Bradycardia 04/14/2017   OSA (obstructive sleep apnea) 04/10/2017   Syncope 01/02/2017   PVC (premature ventricular contraction) 01/02/2017   Class 2 obesity with body mass index (BMI) of 35.0 to 35.9 in adult 11/13/2016   Chest pain, unspecified 10/16/2016   Hyperlipidemia 10/16/2016   Radicular pain of left lower extremity 01/23/2016   RLS (restless legs syndrome) 01/23/2016   Hemiballismus 01/17/2015   Migraine without aura 01/19/2013   Migraine 08/03/2010   Hip pain, left 04/26/2010   Hemorrhoids, internal, with bleeding 04/26/2010   Daytime hypersomnolence 11/14/2009   REACTIVE HYPOGLYCEMIA 04/13/2008   WEIGHT GAIN  04/13/2008   SCHIZOTYPAL PERSONALITY DISORDER 07/07/2007   Low back pain with radiation 11/06/2006   Seizures (Center) 11/06/2006   Headache, unspecified headache type 11/06/2006    Immunization History  Administered  Date(s) Administered   Influenza Split 12/03/2010   Influenza Whole 12/04/2006, 11/17/2007, 01/11/2009   Influenza,inj,Quad PF,6+ Mos 10/25/2018   Td 12/04/2006    Conditions to be addressed/monitored:  Hyperlipidemia, GERD and Migraines, Personality disorder  Care Plan : [place]  Updates made by Viona Gilmore, [place] since 08/04/2020 12:00 AM     Problem: Problem: Hyperlipidemia, GERD and Migraines, Personality disorder      Long-Range Goal: Patient-Specific Goal   Start Date: 07/20/2020  Expected End Date: 07/20/2021  This Visit's Progress: On track  Priority: High  Note:   Current Barriers:  Unable to independently monitor therapeutic efficacy Unable to achieve control of cholesterol  Unable to self administer medications as prescribed  Pharmacist Clinical Goal(s):  Patient will achieve adherence to monitoring guidelines and medication adherence to achieve therapeutic efficacy achieve ability to self administer medications as prescribed through use of an alarm as evidenced by patient report through collaboration with PharmD and provider.   Interventions: 1:1 collaboration with Martinique, Betty G, MD regarding development and update of comprehensive plan of care as evidenced by provider attestation and co-signature Inter-disciplinary care team collaboration (see longitudinal plan of care) Comprehensive medication review performed; medication list updated in electronic medical record  Hyperlipidemia: (LDL goal < 100) -Controlled -Current treatment: Pravastatin 20 mg 1 tablet daily - not taking -Medications previously tried: none  -Current dietary patterns: not eating out much; eating leaner meats -Current exercise habits: pushing carts at work; no structured exercsie -Educated on Cholesterol goals;  Benefits of statin for ASCVD risk reduction; Importance of limiting foods high in cholesterol; Exercise goal of 150 minutes per week; -Counseled on diet and  exercise extensively Recommended restarting pravastatin and contacting cardiology for a refill.  PVCs (Goal: prevent heart events) -Controlled -Current treatment: Rate control: Multaq 400 mg 1 tablet twice daily with meals -Medications previously tried: none -Home BP and HR readings: does not check at home  -Counseled on  importance of monitoring HR at home -Recommended to continue current medication  Personality disorder/insomnia (Goal: minimize symptoms and improve sleep) -Uncontrolled -Current treatment: Notriptyline 50 mg 2 capsules daily at bedtime -Medications previously tried/failed: n/a -Educated on Benefits of medication for symptom control Benefits of cognitive-behavioral therapy with or without medication -Counseled on practicing good sleep hygiene by setting a sleep schedule and maintaining it, avoid excessive napping, following a nightly routine, avoiding screen time for 30-60 minutes before going to bed, and making the bedroom a cool, quiet and dark space  Migraines (Goal: minimize symptoms and severity of migraines) -Controlled -Current treatment  Aimovig 140 mg/mL inject into the skin every 30 days Sumatriptan 0.6 mg/0.5 mL inject into the skin as needed Topiramate 50 mg 1 tablet in the morning and 2 tablets in the evening -Medications previously tried: n/a  -Recommended to continue current medication  Restless legs syndrome (Goal: minimize symptoms) -Controlled -Current treatment  Ropinirole 0.25 mg 1 tablet at bedtime - taking 2 tablets -Medications previously tried: none  -Counseled on getting an updated prescription for 2 tablets daily  Osteoarthritis (Goal: minimize symptoms) -Not ideally controlled -Current treatment  Meloxicam 15 mg 1 tablet daily as needed - taking daily -Medications previously tried: n/a  -Educated on risk for bleeding with NSAIDs given history of ulcers. Recommended Tylenol for pain.  Health Maintenance -Vaccine gaps: shingles,  COVID, tetanus -Current therapy:  Multivitamin 1 tablet daily -Educated on Cost vs benefit of each product must be carefully weighed by individual consumer -Patient is satisfied with current therapy and denies issues -Recommended to continue current medication  Patient Goals/Self-Care Activities Patient will:  - take medications as prescribed focus on medication adherence by setting an alarm for medication reminders target a minimum of 150 minutes of moderate intensity exercise weekly engage in dietary modifications by limiting intake of sweets  Follow Up Plan: The care management team will reach out to the patient again over the next 30 days.         Medication Assistance: None required.  Patient affirms current coverage meets needs.  Compliance/Adherence/Medication fill history: Care Gaps: Shingrix, COVID, Hep C screening  Star-Rating Drugs: Rosuvastatin - last filled 2021  Patient's preferred pharmacy is:  [facility], [address] [place] [address] Phone: [phone] Fax: [phone]  Uses pill box? Yes Pt endorses 50% compliance  We discussed: Benefits of medication synchronization, packaging and delivery as well as enhanced pharmacist oversight with [employer]. Patient decided to: Continue current medication management strategy  Care Plan and Follow Up Patient Decision:  Patient agrees to Care Plan and Follow-up.  Plan: The care management team will reach out to the patient again over the next 30 days.  Jeni Salles, PharmD [facility] Clinical Pharmacist [place] [facility] [place]

## 2020-07-20 NOTE — Telephone Encounter (Signed)
*  STAT* If patient is at the pharmacy, call can be transferred to refill team.   1. Which medications need to be refilled? (please list name of each medication and dose if known)  pravastatin (PRAVACHOL) 20 MG tablet  2. Which pharmacy/location (including street and city if local pharmacy) is medication to be sent to? [place], [place]  3. Do they need a 30 day or 90 day supply? 90 with refills  Patient is scheduled to see Dr. Percival Spanish 08/16/20 at 8:40 am

## 2020-07-26 ENCOUNTER — Telehealth: Payer: Self-pay | Admitting: Cardiology

## 2020-07-26 ENCOUNTER — Other Ambulatory Visit: Payer: Self-pay

## 2020-07-26 DIAGNOSIS — E782 Mixed hyperlipidemia: Secondary | ICD-10-CM

## 2020-07-26 MED ORDER — PRAVASTATIN SODIUM 20 MG PO TABS: 20.0000 mg | ORAL_TABLET | Freq: Every day | ORAL | 3 refills | Status: DC

## 2020-07-26 MED ORDER — PRAVASTATIN SODIUM 20 MG PO TABS: 20.0000 mg | ORAL_TABLET | Freq: Every day | ORAL | 1 refills | Status: DC

## 2020-07-26 NOTE — Telephone Encounter (Signed)
*  STAT* If patient is at the pharmacy, call can be transferred to refill team.   1. Which medications need to be refilled? (please list name of each medication and dose if known) pravastatin (PRAVACHOL) 20 MG tablet  2. Which pharmacy/location (including street and city if local pharmacy) is medication to be sent to? [place], [place]  3. Do they need a 30 day or 90 day supply? 90 day supply

## 2020-08-02 DIAGNOSIS — M47816 Spondylosis without myelopathy or radiculopathy, lumbar region: Secondary | ICD-10-CM | POA: Diagnosis not present

## 2020-08-03 ENCOUNTER — Other Ambulatory Visit: Payer: Self-pay

## 2020-08-04 ENCOUNTER — Encounter: Payer: Self-pay | Admitting: Family Medicine

## 2020-08-04 ENCOUNTER — Ambulatory Visit (INDEPENDENT_AMBULATORY_CARE_PROVIDER_SITE_OTHER): Payer: Medicare HMO | Admitting: Family Medicine

## 2020-08-04 VITALS — BP 132/80 | HR 81 | Resp 16 | Ht 74.0 in | Wt 270.0 lb

## 2020-08-04 DIAGNOSIS — Z125 Encounter for screening for malignant neoplasm of prostate: Secondary | ICD-10-CM | POA: Diagnosis not present

## 2020-08-04 DIAGNOSIS — E782 Mixed hyperlipidemia: Secondary | ICD-10-CM

## 2020-08-04 DIAGNOSIS — Z1159 Encounter for screening for other viral diseases: Secondary | ICD-10-CM | POA: Diagnosis not present

## 2020-08-04 DIAGNOSIS — Z0279 Encounter for issue of other medical certificate: Secondary | ICD-10-CM

## 2020-08-04 DIAGNOSIS — Z6835 Body mass index (BMI) 35.0-35.9, adult: Secondary | ICD-10-CM

## 2020-08-04 DIAGNOSIS — S0990XA Unspecified injury of head, initial encounter: Secondary | ICD-10-CM | POA: Diagnosis not present

## 2020-08-04 DIAGNOSIS — Z0000 Encounter for general adult medical examination without abnormal findings: Secondary | ICD-10-CM

## 2020-08-04 DIAGNOSIS — R350 Frequency of micturition: Secondary | ICD-10-CM

## 2020-08-04 DIAGNOSIS — R079 Chest pain, unspecified: Secondary | ICD-10-CM

## 2020-08-04 LAB — URINALYSIS, ROUTINE W REFLEX MICROSCOPIC
Bilirubin Urine: NEGATIVE
Hgb urine dipstick: NEGATIVE
Ketones, ur: NEGATIVE
Leukocytes,Ua: NEGATIVE
Nitrite: NEGATIVE
RBC / HPF: NONE SEEN (ref 0–?)
Specific Gravity, Urine: 1.025 (ref 1.000–1.030)
Total Protein, Urine: NEGATIVE
Urine Glucose: NEGATIVE
Urobilinogen, UA: 0.2 (ref 0.0–1.0)
pH: 6 (ref 5.0–8.0)

## 2020-08-04 LAB — COMPREHENSIVE METABOLIC PANEL
ALT: 23 U/L (ref 0–53)
AST: 20 U/L (ref 0–37)
Albumin: 4.5 g/dL (ref 3.5–5.2)
Alkaline Phosphatase: 62 U/L (ref 39–117)
BUN: 25 mg/dL — ABNORMAL HIGH (ref 6–23)
CO2: 24 mEq/L (ref 19–32)
Calcium: 9.4 mg/dL (ref 8.4–10.5)
Chloride: 109 mEq/L (ref 96–112)
Creatinine, Ser: 0.93 mg/dL (ref 0.40–1.50)
GFR: 92.77 mL/min (ref >60.00)
Glucose, Bld: 83 mg/dL (ref 70–99)
Potassium: 4.5 mEq/L (ref 3.5–5.1)
Sodium: 141 mEq/L (ref 135–145)
Total Bilirubin: 0.4 mg/dL (ref 0.2–1.2)
Total Protein: 7 g/dL (ref 6.0–8.3)

## 2020-08-04 LAB — HEMOGLOBIN A1C: Hgb A1c MFr Bld: 5.3 % (ref 4.6–6.5)

## 2020-08-04 LAB — LIPID PANEL
Cholesterol: 146 mg/dL (ref 0–200)
HDL: 38.1 mg/dL — ABNORMAL LOW (ref >39.00)
LDL Cholesterol: 84 mg/dL (ref 0–99)
NonHDL: 107.71
Total CHOL/HDL Ratio: 4
Triglycerides: 119 mg/dL (ref 0.0–149.0)
VLDL: 23.8 mg/dL (ref 0.0–40.0)

## 2020-08-04 LAB — PSA: PSA: 0.19 ng/mL (ref 0.10–4.00)

## 2020-08-04 NOTE — Patient Instructions (Addendum)
A few things to remember from today's visit:   Mixed hyperlipidemia - Plan: Comprehensive metabolic panel, Lipid panel  Class 2 severe obesity due to excess calories with serious comorbidity and body mass index (BMI) of 35.0 to 35.9 in adult [facility]), Chronic  Encounter for HCV screening test for low risk patient - Plan: Hepatitis C antibody  Routine general medical examination at a health care facility  Prostate cancer screening - Plan: PSA  Urine frequency - Plan: Urinalysis, Routine w reflex microscopic, Hemoglobin A1c  If you need refills please call your pharmacy. Do not use My Chart to request refills or for acute issues that need immediate attention.    At least 150 minutes of moderate exercise per week, daily brisk walking for 15-30 min is a good exercise option. Healthy diet low in saturated (animal) fats and sweets and consisting of fresh fruits and vegetables, lean meats such as fish and white chicken and whole grains.  - Vaccines:  Tdap vaccine every 10 years.  Shingles vaccine recommended at age 24, could be given after 55 years of age but not sure about insurance coverage.  Pneumonia vaccines: Pneumovax at 35  -Screening recommendations for low/normal risk males:  Screening for diabetes at age 109 and every 3 years. Earlier screening if cardiovascular risk factors.   Lipid screening at 35 and every 3 years. Screening starts in younger males with cardiovascular risk factors.  Colon cancer screening is now at age 43 but your insurance may not cover until age 25 .screening is recommended age 110.  Prostate cancer screening: some controversy, starts usually at 88: Rectal exam and PSA.  Aortic Abdominal Aneurism once between 65 and 44 years old if ever smoker.  Also recommended:  Dental visit- Brush and floss your teeth twice daily; visit your dentist twice a year. Eye doctor- Get an eye exam at least every 2 years. Helmet use- Always wear a helmet when riding a  bicycle, motorcycle, rollerblading or skateboarding. Safe sex- If you may be exposed to sexually transmitted infections, use a condom. Seat belts- Seat belts can save your live; always wear one. Smoke/Carbon Monoxide detectors- These detectors need to be installed on the appropriate level of your home. Replace batteries at least once a year. Skin cancer- When out in the sun please cover up and use sunscreen 15 SPF or higher. Violence- If anyone is threatening or hurting you, please tell your healthcare provider.  Drink alcohol in moderation- Limit alcohol intake to one drink or less per day. Never drink and drive.  Please be sure medication list is accurate. If a new problem present, please set up appointment sooner than planned today.           Why follow it? Research shows. Those who follow the Mediterranean diet have a reduced risk of heart disease  The diet is associated with a reduced incidence of Parkinson's and Alzheimer's diseases People following the diet may have longer life expectancies and lower rates of chronic diseases  The Dietary Guidelines for Americans recommends the Mediterranean diet as an eating plan to promote health and prevent disease  What Is the Mediterranean Diet?  Healthy eating plan based on typical foods and recipes of Mediterranean-style cooking The diet is primarily a plant based diet; these foods should make up a majority of meals   Starches - Plant based foods should make up a majority of meals - They are an important sources of vitamins, minerals, energy, antioxidants, and fiber - Choose whole grains,  foods high in fiber and minimally processed items  - Typical grain sources include wheat, oats, barley, corn, brown rice, bulgar, farro, millet, polenta, couscous  - Various types of beans include chickpeas, lentils, fava beans, black beans, white beans   Fruits  Veggies - Large quantities of antioxidant rich fruits & veggies; 6 or more servings  -  Vegetables can be eaten raw or lightly drizzled with oil and cooked  - Vegetables common to the traditional Mediterranean Diet include: artichokes, arugula, beets, broccoli, brussel sprouts, cabbage, carrots, celery, collard greens, cucumbers, eggplant, kale, leeks, lemons, lettuce, mushrooms, okra, onions, peas, peppers, potatoes, pumpkin, radishes, rutabaga, shallots, spinach, sweet potatoes, turnips, zucchini - Fruits common to the Mediterranean Diet include: apples, apricots, avocados, cherries, clementines, dates, figs, grapefruits, grapes, melons, nectarines, oranges, peaches, pears, pomegranates, strawberries, tangerines  Fats - Replace butter and margarine with healthy oils, such as olive oil, canola oil, and tahini  - Limit nuts to no more than a handful a day  - Nuts include walnuts, almonds, pecans, pistachios, pine nuts  - Limit or avoid candied, honey roasted or heavily salted nuts - Olives are central to the [employer] - can be eaten whole or used in a variety of dishes   Meats Protein - Limiting red meat: no more than a few times a month - When eating red meat: choose lean cuts and keep the portion to the size of deck of cards - Eggs: approx. 0 to 4 times a week  - Fish and lean poultry: at least 2 a week  - Healthy protein sources include, chicken, [place], lean beef, lamb - Increase intake of seafood such as tuna, salmon, trout, mackerel, shrimp, scallops - Avoid or limit high fat processed meats such as sausage and bacon  Dairy - Include moderate amounts of low fat dairy products  - Focus on healthy dairy such as fat free yogurt, skim milk, low or reduced fat cheese - Limit dairy products higher in fat such as whole or 2% milk, cheese, ice cream  Alcohol - Moderate amounts of red wine is ok  - No more than 5 oz daily for women (all ages) and men older than age 62  - No more than 10 oz of wine daily for men younger than 24  Other - Limit sweets and other desserts  - Use  herbs and spices instead of salt to flavor foods  - Herbs and spices common to the traditional Mediterranean Diet include: basil, bay leaves, chives, cloves, cumin, fennel, garlic, lavender, marjoram, mint, oregano, parsley, pepper, rosemary, sage, savory, sumac, tarragon, thyme   It's not just a diet, it's a lifestyle:  The Mediterranean diet includes lifestyle factors typical of those in the region  Foods, drinks and meals are best eaten with others and savored Daily physical activity is important for overall good health This could be strenuous exercise like running and aerobics This could also be more leisurely activities such as walking, housework, yard-work, or taking the stairs Moderation is the key; a balanced and healthy diet accommodates most foods and drinks Consider portion sizes and frequency of consumption of certain foods   Meal Ideas & Options:  Breakfast:  Whole wheat toast or whole wheat English muffins with peanut butter & hard boiled egg Steel cut oats topped with apples & cinnamon and skim milk  Fresh fruit: banana, strawberries, melon, berries, peaches  Smoothies: strawberries, bananas, greek yogurt, peanut butter Low fat greek yogurt with blueberries and granola  Egg white  omelet with spinach and mushrooms Breakfast couscous: whole wheat couscous, apricots, skim milk, cranberries  Sandwiches:  Hummus and grilled vegetables (peppers, zucchini, squash) on whole wheat bread   Grilled chicken on whole wheat pita with lettuce, tomatoes, cucumbers or tzatziki   salad on whole wheat bread: tuna salad made with greek yogurt, olives, red peppers, capers, green onions Garlic rosemary lamb pita: lamb sauted with garlic, rosemary, salt & pepper; add lettuce, cucumber, greek yogurt to pita - flavor with lemon juice and black pepper  Seafood:  Mediterranean grilled salmon, seasoned with garlic, basil, parsley, lemon juice and black pepper Shrimp, lemon, and spinach  whole-grain pasta salad made with low fat greek yogurt  Seared scallops with lemon orzo  Seared tuna steaks seasoned salt, pepper, coriander topped with tomato mixture of olives, tomatoes, olive oil, minced garlic, parsley, green onions and cappers  Meats:  Herbed greek chicken salad with kalamata olives, cucumber, feta  Red bell peppers stuffed with spinach, bulgur, lean ground beef (or lentils) & topped with feta   Kebabs: skewers of chicken, tomatoes, onions, zucchini, squash  [place] burgers: made with red onions, mint, dill, lemon juice, feta cheese topped with roasted red peppers Vegetarian Cucumber salad: cucumbers, artichoke hearts, celery, red onion, feta cheese, tossed in olive oil & lemon juice  Hummus and whole grain pita points with a greek salad (lettuce, tomato, feta, olives, cucumbers, red onion) Lentil soup with celery, carrots made with vegetable broth, garlic, salt and pepper  Tabouli salad: parsley, bulgur, mint, scallions, cucumbers, tomato, radishes, lemon juice, olive oil, salt and pepper.

## 2020-08-04 NOTE — Patient Instructions (Signed)
Hi Steven Vincent,  It was great to get to meet you in person! Below is a summary of some of the topics we discussed.   Please reach out to me if you have any questions or need anything before our follow up!  Best, Steven Vincent  Steven Vincent, PharmD, Steven Vincent [facility] [place] [phone]   Visit Information   Goals Addressed   None    Patient Care Plan: CCM Pharmacy Care Plan     Problem Identified: Problem: Hyperlipidemia, GERD and Migraines, Personality disorder      Long-Range Goal: Patient-Specific Goal   Start Date: 07/20/2020  Expected End Date: 07/20/2021  This Visit's Progress: On track  Priority: High  Note:   Current Barriers:  Unable to independently monitor therapeutic efficacy Unable to achieve control of cholesterol  Unable to self administer medications as prescribed  Pharmacist Clinical Goal(s):  Patient will achieve adherence to monitoring guidelines and medication adherence to achieve therapeutic efficacy achieve ability to self administer medications as prescribed through use of an alarm as evidenced by patient report through collaboration with PharmD and provider.   Interventions: 1:1 collaboration with Steven Vincent, Steven G, MD regarding development and update of comprehensive plan of care as evidenced by provider attestation and co-signature Inter-disciplinary care team collaboration (see longitudinal plan of care) Comprehensive medication review performed; medication list updated in electronic medical record  Hyperlipidemia: (LDL goal < 100) -Controlled -Current treatment: Pravastatin 20 mg 1 tablet daily - not taking -Medications previously tried: none  -Current dietary patterns: not eating out much; eating leaner meats -Current exercise habits: pushing carts at work; no structured exercsie -Educated on Cholesterol goals;  Benefits of statin for ASCVD risk reduction; Importance of limiting foods high in  cholesterol; Exercise goal of 150 minutes per week; -Counseled on diet and exercise extensively Recommended restarting pravastatin and contacting cardiology for a refill.  PVCs (Goal: prevent heart events) -Controlled -Current treatment: Rate control: Multaq 400 mg 1 tablet twice daily with meals -Medications previously tried: none -Home BP and HR readings: does not check at home  -Counseled on  importance of monitoring HR at home -Recommended to continue current medication  Personality disorder/insomnia (Goal: minimize symptoms and improve sleep) -Uncontrolled -Current treatment: Notriptyline 50 mg 2 capsules daily at bedtime -Medications previously tried/failed: n/a -Educated on Benefits of medication for symptom control Benefits of cognitive-behavioral therapy with or without medication -Counseled on practicing good sleep hygiene by setting a sleep schedule and maintaining it, avoid excessive napping, following a nightly routine, avoiding screen time for 30-60 minutes before going to bed, and making the bedroom a cool, quiet and dark space  Migraines (Goal: minimize symptoms and severity of migraines) -Controlled -Current treatment  Aimovig 140 mg/mL inject into the skin every 30 days Sumatriptan 0.6 mg/0.5 mL inject into the skin as needed Topiramate 50 mg 1 tablet in the morning and 2 tablets in the evening -Medications previously tried: n/a  -Recommended to continue current medication  Restless legs syndrome (Goal: minimize symptoms) -Controlled -Current treatment  Ropinirole 0.25 mg 1 tablet at bedtime - taking 2 tablets -Medications previously tried: none  -Counseled on getting an updated prescription for 2 tablets daily  Osteoarthritis (Goal: minimize symptoms) -Not ideally controlled -Current treatment  Meloxicam 15 mg 1 tablet daily as needed - taking daily -Medications previously tried: n/a  -Educated on risk for bleeding with NSAIDs given history of ulcers.  Recommended Tylenol for pain.   Health Maintenance -Vaccine gaps: shingles, COVID, tetanus -Current therapy:  Multivitamin 1 tablet daily -Educated on Cost vs benefit of each product must be carefully weighed by individual consumer -Patient is satisfied with current therapy and denies issues -Recommended to continue current medication  Patient Goals/Self-Care Activities Patient will:  - take medications as prescribed focus on medication adherence by setting an alarm for medication reminders target a minimum of 150 minutes of moderate intensity exercise weekly engage in dietary modifications by limiting intake of sweets  Follow Up Plan: The care management team will reach out to the patient again over the next 30 days.       Mr. Folson was given information about Chronic Care Management services today including:  CCM service includes personalized support from designated clinical staff supervised by his physician, including individualized plan of care and coordination with other care providers 24/7 contact phone numbers for assistance for urgent and routine care needs. Standard insurance, coinsurance, copays and deductibles apply for chronic care management only during months in which we provide at least 20 minutes of these services. Most insurances cover these services at 100%, however patients may be responsible for any copay, coinsurance and/or deductible if applicable. This service may help you avoid the need for more expensive face-to-face services. Only one practitioner may furnish and bill the service in a calendar month. The patient may stop CCM services at any time (effective at the end of the month) by phone call to the office staff.  Patient agreed to services and verbal consent obtained.   The patient verbalized understanding of instructions, educational materials, and care plan provided today and agreed to receive a mailed copy of patient instructions, educational  materials, and care plan.  The pharmacy team will reach out to the patient again over the next 30 days.   Steven Vincent, [facility]

## 2020-08-04 NOTE — Progress Notes (Signed)
HPI: [name] is a 55 y.o.male here today for his routine physical examination.  Last CPE: 11/12/17. He lives alone.  He is planning on applying for low income housing once he turns 71.    He was homeless for a few days. Currently he is sleeping on his couch, he is not able to wear her his CPAP. Sleeping about 4 hours.  Regular exercise 3 or more times per week: He does not exercise regularly but he is active at work.  Following a healthy diet: States that he is just eating salads and baked chicken. He has gained some weight since his last visit.  Chronic medical problems: Insomnia, hyperlipidemia, chronic back pain, migraines, OSA, hearing loss, PVCs, schizotypal personality disorder, and allergic rhinitis among some.  He follows with psychiatrists,neurologist,and orthopedist regularly.   Immunization History  Administered Date(s) Administered   Influenza Split 12/03/2010   Influenza Whole 12/04/2006, 11/17/2007, 01/11/2009   Influenza,inj,Quad PF,6+ Mos 10/25/2018   Td 12/04/2006   -Hep C screening: Never. Last colon cancer screening: 09/21/2015. Last prostate ca screening: PSA was 0.2 on 03/17/2019. Urinary frequency, nocturia x3 (chronic). Negative for dysuria and gross hematuria.  Former smoker, smoked in his 98s.  -Concerns and/or follow up today:  Hyperlipidemia: He just started pravastatin 20 mg yesterday.  Lab Results  Component Value Date   CHOL 124 07/07/2018   HDL 28.90 (L) 07/07/2018   LDLCALC 74 10/16/2016   LDLDIRECT 70.0 07/07/2018   TRIG 228.0 (H) 07/07/2018   CHOLHDL 4 07/07/2018   Lab Results  Component Value Date   CREATININE 0.89 01/13/2020   BUN 19 01/13/2020   NA 140 01/13/2020   K 4.0 01/13/2020   CL 111 01/13/2020   CO2 21 (L) 01/13/2020   -Requesting a prescription for "hero" medication dispenser.  2 to 3 days of mid chest achy pain, it does "hurt a lot." Pain is constant, no radiated. No associated dyspnea,  palpitations, or diaphoresis. No history of trauma. Heartburn yesterday.  Adenosine stress test on 02/09/20:  The left ventricular ejection fraction is mildly decreased (45-54%). Nuclear stress EF: 52%. There was no ST segment deviation noted during stress. The study is normal. This is a low risk study wiht no evidence of ischemia.  PVC's on Multaq 400 mg bid. He follows with cardiologist.  He also would like for me to check his scalp, small wound. On 07/31/2020 around 6:30 pm he had an injury at work. He states that hit his head against a metal structure,shelf (?). According to pt, he reported it and he was told to ask pcp to "take a look." Lesion is healing well, no pain.  Has been applying hydrogen peroxide daily.  Review of Systems  Constitutional:  Positive for fatigue. Negative for activity change, appetite change and fever.  HENT:  Negative for mouth sores, nosebleeds and sore throat.   Eyes:  Negative for redness and visual disturbance.  Respiratory:  Negative for cough and wheezing.   Cardiovascular:  Negative for leg swelling.  Gastrointestinal:  Negative for abdominal pain, blood in stool, nausea and vomiting.  Endocrine: Negative for cold intolerance, heat intolerance, polydipsia, polyphagia and polyuria.  Genitourinary:  Positive for frequency. Negative for decreased urine volume, genital sores and testicular pain.  Musculoskeletal:  Positive for arthralgias and back pain.  Skin:  Negative for color change and rash.  Allergic/Immunologic: Positive for environmental allergies.  Neurological:  Negative for dizziness, syncope, weakness and headaches.  Hematological:  Negative for adenopathy.  Does not bruise/bleed easily.  Psychiatric/Behavioral:  Negative for confusion and hallucinations. The patient is nervous/anxious.     Current Outpatient Medications on File Prior to Visit  Medication Sig Dispense Refill   Erenumab-aooe (AIMOVIG) 140 MG/ML SOAJ Inject 140 mg into  the skin every 30 (thirty) days. 3 mL 3   meloxicam (MOBIC) 15 MG tablet TAKE 1 TABLET DAILY. IF NEEDED FOR PAIN. 90 tablet 0   MULTAQ 400 MG tablet TAKE 1 TABLET TWICE DAILY WITH MEALS 180 tablet 3   nortriptyline (PAMELOR) 50 MG capsule Take 2 capsules (100 mg total) by mouth at bedtime. 180 capsule 3   pravastatin (PRAVACHOL) 20 MG tablet Take 1 tablet (20 mg total) by mouth daily. 90 tablet 3   rOPINIRole (REQUIP) 0.25 MG tablet TAKE 1 TABLET (0.25 MG TOTAL) BY MOUTH AT BEDTIME. 90 tablet 1   SUMAtriptan 6 MG/0.5ML SOAJ INJECT 6 MG INTO THE SKIN DAILY AS NEEDED AT MIGRAINE ONSET. MAY REPEAT 1 TIME AFTER 1 HOUR IF NEEDED 0.5 mL 0   topiramate (TOPAMAX) 50 MG tablet Take one tablet in the AM and two tablets in the PM. 270 tablet 3   No current facility-administered medications on file prior to visit.   Past Medical History:  Diagnosis Date   Arthritis    Baker's cyst    Headache(784.0)    Knee pain    Low back pain    Migraines    OSA (obstructive sleep apnea) 04/10/2017   Personality disorder (HCC)    Seizures ([place])     Past Surgical History:  Procedure Laterality Date   TOOTH EXTRACTION  12/2014   lower left    Allergies  Allergen Reactions   Delsym [Dextromethorphan] Other (See Comments)    Gets loopy/memory loss   Aspirin     GI upset Other reaction(s): Vomiting   Mucinex D [Pseudoephedrine-Guaifenesin Er] Hypertension    Family History  Problem Relation Age of Onset   Heart disease Father        defibrilator   Hyperlipidemia Father    Hypertension Father    Alzheimer's disease Father    Other Father        stents in legs   Aneurysm Mother        brain   Hypertension Brother    Stroke Brother        X2    Social History   Socioeconomic History   Marital status: Divorced    Spouse name: Not on file   Number of children: 0   Years of education: Not on file   Highest education level: Not on file  Occupational History   Occupation: disabled  Tobacco  Use   Smoking status: Former    Packs/day: 1.00    Years: 1.00    Pack years: 1.00    Types: Cigarettes   Smokeless tobacco: Never   Tobacco comments:    states he only tried as a teenager  Scientific laboratory technician Use: Never used  Substance and Sexual Activity   Alcohol use: No   Drug use: No   Sexual activity: Not Currently  Other Topics Concern   Not on file  Social History Narrative   03/16/18: 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 Wife of 16 years left him about 2 years ago, and pt. has had difficulty adjusting.   Says his brother Francee Piccolo, who lives in [place], is his main contact, source of support, as well as his dog.  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 States he uses painting as additional therapy.    Social Determinants of Health   Financial Resource Strain: Low Risk    Difficulty of Paying Living Expenses: Not hard at all  Food Insecurity: No Food Insecurity   Worried About Charity fundraiser in the Last Year: Never true   [place] in the Last Year: Never true  Transportation Needs: No Transportation Needs   Lack of Transportation (Medical): No   Lack of Transportation (Non-Medical): No  Physical Activity: Inactive   Days of Exercise per Week: 0 days   Minutes of Exercise per Session: 0 min  Stress: No Stress Concern Present   Feeling of Stress : Not at all  Social Connections: Socially Isolated   Frequency of Communication with Friends and Family: Never   Frequency of Social Gatherings with Friends and Family: Never   Attends Religious Services: More than 4 times per year   Active Member of Clubs or Organizations: No   Attends Archivist Meetings: Never   Marital Status: Divorced   Vitals:   08/04/20 0844  BP: 132/80  Pulse: 81  Resp: 16  SpO2: 98%   Body mass index is 34.67 kg/m.  Wt Readings from Last 3 Encounters:  08/04/20 270 lb  (122.5 kg)  04/26/20 270 lb (122.5 kg)  04/05/20 262 lb 6 oz (119 kg)   Physical Exam Vitals and nursing note reviewed.  Constitutional:      General: He is not in acute distress.    Appearance: He is well-developed.  HENT:     Head: Atraumatic.      Right Ear: Tympanic membrane, ear canal and external ear normal.     Left Ear: Tympanic membrane, ear canal and external ear normal.  Eyes:     Conjunctiva/sclera: Conjunctivae normal.     Pupils: Pupils are equal, round, and reactive to light.  Neck:     Thyroid: No thyromegaly.     Trachea: No tracheal deviation.  Cardiovascular:     Rate and Rhythm: Normal rate and regular rhythm.     Pulses:          Dorsalis pedis pulses are 2+ on the right side and 2+ on the left side.     Heart sounds: No murmur heard. Pulmonary:     Effort: Pulmonary effort is normal. No respiratory distress.     Breath sounds: Normal breath sounds.  Chest:     Chest wall: No tenderness.  Breasts:    Right: No supraclavicular adenopathy.     Left: No supraclavicular adenopathy.  Abdominal:     Palpations: Abdomen is soft. There is no hepatomegaly or mass.     Tenderness: There is no abdominal tenderness.  Genitourinary:    Comments: No concerns. Musculoskeletal:        General: No tenderness.     Cervical back: Normal range of motion.     Comments: No signs of synovitis.  Lymphadenopathy:     Cervical: No cervical adenopathy.     Upper Body:     Right upper body: No supraclavicular adenopathy.     Left upper body: No supraclavicular adenopathy.  Skin:    General: Skin is warm.     Findings: No erythema.     Comments: Supperficial lacerations on left pretibial area. Right-sided parietal linear wound, healing well, mild edema, not tender.  Neurological:     Mental Status: He is alert and oriented to person, place, and time.     Cranial Nerves: No cranial nerve deficit.     Sensory: No sensory deficit.     Coordination: Coordination normal.      Gait: Gait normal.     Deep Tendon Reflexes:     Reflex Scores:      Bicep reflexes are 2+ on the right side and 2+ on the left side.      Patellar reflexes are 2+ on the right side and 2+ on the left side.  ASSESSMENT AND PLAN:  Mr.Nassim was seen today for annual exam.  Diagnoses and all orders for this visit: Orders Placed This Encounter  Procedures   Comprehensive metabolic panel   Lipid panel   PSA   Hepatitis C antibody   Urinalysis, Routine w reflex microscopic   Hemoglobin A1c   Lab Results  Component Value Date   HGBA1C 5.3 08/04/2020   Lab Results  Component Value Date   CHOL 146 08/04/2020   HDL 38.10 (L) 08/04/2020   LDLCALC 84 08/04/2020   LDLDIRECT 70.0 07/07/2018   TRIG 119.0 08/04/2020   CHOLHDL 4 08/04/2020   Lab Results  Component Value Date   PSA 0.19 08/04/2020   PSA 0.23 03/17/2019    Lab Results  Component Value Date   CREATININE 0.93 08/04/2020   BUN 25 (H) 08/04/2020   NA 141 08/04/2020   K 4.5 08/04/2020   CL 109 08/04/2020   CO2 24 08/04/2020   Lab Results  Component Value Date   ALT 23 08/04/2020   AST 20 08/04/2020   ALKPHOS 62 08/04/2020   BILITOT 0.4 08/04/2020   Routine general medical examination at a health care facility We discussed the importance of regular physical activity and healthy diet for prevention of chronic illness and/or complications. Preventive guidelines reviewed. Vaccination up to date.  Next CPE in a year.  The 10-year ASCVD risk score Mikey Bussing DC Brooke Bonito., et al., 2013) is: 5.2%   Values used to calculate the score:     Age: 62 years     Sex: Male     Is Non-Hispanic African American: No     Diabetic: No     Tobacco smoker: No     Systolic Blood Pressure: 782 mmHg     Is BP treated: No     HDL Cholesterol: 38.1 mg/dL     Total Cholesterol: 146 mg/dL  Class 2 severe obesity due to excess calories with serious comorbidity and body mass index (BMI) of 35.0 to 35.9 in adult [facility]) We discussed benefits  of wt loss as well as adverse effects of obesity. Consistency with healthy diet and physical activity recommended. Weight Watchers is a good option as well as daily brisk walking for 15-30 min as tolerated.  Mixed hyperlipidemia Continue Pravastatin 20 mg daily and low fat diet.  Encounter for HCV screening test for low risk patient -     Hepatitis C antibody  Prostate cancer screening -     PSA  Urine frequency Possible causes discussed, ? BPH. Further recommendations according to UA and PSA.  Chest pain, unspecified type Possible etiologies discussed. Hx and examination today do not suggest a serious process. Instructed to arrange appt with his cardiologist.  Scalp injury, initial encounter Area is healing well. Keep wound clean with soap and water. Monitor for signs of infection.  Return in 6 months (on 02/03/2021).   Malka So  [place], [place]. [place] office.  A few things to remember from today's visit:   Mixed hyperlipidemia - Plan: Comprehensive metabolic panel, Lipid panel  Class 2 severe obesity due to excess calories with serious comorbidity and body mass index (BMI) of 35.0 to 35.9 in adult [facility]), Chronic  Encounter for HCV screening test for low risk patient - Plan: Hepatitis C antibody  Routine general medical examination at a health care facility  Prostate cancer screening - Plan: PSA  Urine frequency - Plan: Urinalysis, Routine w reflex microscopic, Hemoglobin A1c  If you need refills please call your pharmacy. Do not use My Chart to request refills or for acute issues that need immediate attention.    At least 150 minutes of moderate exercise per week, daily brisk walking for 15-30 min is a good exercise option. Healthy diet low in saturated (animal) fats and sweets and consisting of fresh fruits and vegetables, lean meats such as fish and white chicken and whole grains.  - Vaccines:  Tdap vaccine every 10 years.  Shingles  vaccine recommended at age 25, could be given after 55 years of age but not sure about insurance coverage.  Pneumonia vaccines: Pneumovax at 83  -Screening recommendations for low/normal risk males:  Screening for diabetes at age 57 and every 3 years. Earlier screening if cardiovascular risk factors.   Lipid screening at 35 and every 3 years. Screening starts in younger males with cardiovascular risk factors.  Colon cancer screening is now at age 38 but your insurance may not cover until age 61 .screening is recommended age 62.  Prostate cancer screening: some controversy, starts usually at 11: Rectal exam and PSA.  Aortic Abdominal Aneurism once between 77 and 48 years old if ever smoker.  Also recommended:  Dental visit- Brush and floss your teeth twice daily; visit your dentist twice a year. Eye doctor- Get an eye exam at least every 2 years. Helmet use- Always wear a helmet when riding a bicycle, motorcycle, rollerblading or skateboarding. Safe sex- If you may be exposed to sexually transmitted infections, use a condom. Seat belts- Seat belts can save your live; always wear one. Smoke/Carbon Monoxide detectors- These detectors need to be installed on the appropriate level of your home. Replace batteries at least once a year. Skin cancer- When out in the sun please cover up and use sunscreen 15 SPF or higher. Violence- If anyone is threatening or hurting you, please tell your healthcare provider.  Drink alcohol in moderation- Limit alcohol intake to one drink or less per day. Never drink and drive.  Please be sure medication list is accurate. If a new problem present, please set up appointment sooner than planned today.       Why follow it? Research shows. Those who follow the Mediterranean diet have a reduced risk of heart disease  The diet is associated with a reduced incidence of Parkinson's and Alzheimer's diseases People following the diet may have longer life  expectancies and lower rates of chronic diseases  The Dietary Guidelines for Americans recommends the Mediterranean diet as an eating plan to promote health and prevent disease  What Is the Mediterranean Diet?  Healthy eating plan based on typical foods and recipes of Mediterranean-style cooking The diet is primarily a plant based diet; these foods should make up a majority of meals   Starches - Plant based foods should make up a majority of meals - They are an important sources of vitamins, minerals, energy, antioxidants,  and fiber - Choose whole grains, foods high in fiber and minimally processed items  - Typical grain sources include wheat, oats, barley, corn, brown rice, bulgar, farro, millet, polenta, couscous  - Various types of beans include chickpeas, lentils, fava beans, black beans, white beans   Fruits  Veggies - Large quantities of antioxidant rich fruits & veggies; 6 or more servings  - Vegetables can be eaten raw or lightly drizzled with oil and cooked  - Vegetables common to the traditional Mediterranean Diet include: artichokes, arugula, beets, broccoli, brussel sprouts, cabbage, carrots, celery, collard greens, cucumbers, eggplant, kale, leeks, lemons, lettuce, mushrooms, okra, onions, peas, peppers, potatoes, pumpkin, radishes, rutabaga, shallots, spinach, sweet potatoes, turnips, zucchini - Fruits common to the Mediterranean Diet include: apples, apricots, avocados, cherries, clementines, dates, figs, grapefruits, grapes, melons, nectarines, oranges, peaches, pears, pomegranates, strawberries, tangerines  Fats - Replace butter and margarine with healthy oils, such as olive oil, canola oil, and tahini  - Limit nuts to no more than a handful a day  - Nuts include walnuts, almonds, pecans, pistachios, pine nuts  - Limit or avoid candied, honey roasted or heavily salted nuts - Olives are central to the [employer] - can be eaten whole or used in a variety of dishes   Meats  Protein - Limiting red meat: no more than a few times a month - When eating red meat: choose lean cuts and keep the portion to the size of deck of cards - Eggs: approx. 0 to 4 times a week  - Fish and lean poultry: at least 2 a week  - Healthy protein sources include, chicken, [place], lean beef, lamb - Increase intake of seafood such as tuna, salmon, trout, mackerel, shrimp, scallops - Avoid or limit high fat processed meats such as sausage and bacon  Dairy - Include moderate amounts of low fat dairy products  - Focus on healthy dairy such as fat free yogurt, skim milk, low or reduced fat cheese - Limit dairy products higher in fat such as whole or 2% milk, cheese, ice cream  Alcohol - Moderate amounts of red wine is ok  - No more than 5 oz daily for women (all ages) and men older than age 61  - No more than 10 oz of wine daily for men younger than 44  Other - Limit sweets and other desserts  - Use herbs and spices instead of salt to flavor foods  - Herbs and spices common to the traditional Mediterranean Diet include: basil, bay leaves, chives, cloves, cumin, fennel, garlic, lavender, marjoram, mint, oregano, parsley, pepper, rosemary, sage, savory, sumac, tarragon, thyme   It's not just a diet, it's a lifestyle:  The Mediterranean diet includes lifestyle factors typical of those in the region  Foods, drinks and meals are best eaten with others and savored Daily physical activity is important for overall good health This could be strenuous exercise like running and aerobics This could also be more leisurely activities such as walking, housework, yard-work, or taking the stairs Moderation is the key; a balanced and healthy diet accommodates most foods and drinks Consider portion sizes and frequency of consumption of certain foods   Meal Ideas & Options:  Breakfast:  Whole wheat toast or whole wheat English muffins with peanut butter & hard boiled egg Steel cut oats topped with apples &  cinnamon and skim milk  Fresh fruit: banana, strawberries, melon, berries, peaches  Smoothies: strawberries, bananas, greek yogurt, peanut butter Low fat greek yogurt with  blueberries and granola  Egg white omelet with spinach and mushrooms Breakfast couscous: whole wheat couscous, apricots, skim milk, cranberries  Sandwiches:  Hummus and grilled vegetables (peppers, zucchini, squash) on whole wheat bread   Grilled chicken on whole wheat pita with lettuce, tomatoes, cucumbers or tzatziki   salad on whole wheat bread: tuna salad made with greek yogurt, olives, red peppers, capers, green onions Garlic rosemary lamb pita: lamb sauted with garlic, rosemary, salt & pepper; add lettuce, cucumber, greek yogurt to pita - flavor with lemon juice and black pepper  Seafood:  Mediterranean grilled salmon, seasoned with garlic, basil, parsley, lemon juice and black pepper Shrimp, lemon, and spinach whole-grain pasta salad made with low fat greek yogurt  Seared scallops with lemon orzo  Seared tuna steaks seasoned salt, pepper, coriander topped with tomato mixture of olives, tomatoes, olive oil, minced garlic, parsley, green onions and cappers  Meats:  Herbed greek chicken salad with kalamata olives, cucumber, feta  Red bell peppers stuffed with spinach, bulgur, lean ground beef (or lentils) & topped with feta   Kebabs: skewers of chicken, tomatoes, onions, zucchini, squash  [place] burgers: made with red onions, mint, dill, lemon juice, feta cheese topped with roasted red peppers Vegetarian Cucumber salad: cucumbers, artichoke hearts, celery, red onion, feta cheese, tossed in olive oil & lemon juice  Hummus and whole grain pita points with a greek salad (lettuce, tomato, feta, olives, cucumbers, red onion) Lentil soup with celery, carrots made with vegetable broth, garlic, salt and pepper  Tabouli salad: parsley, bulgur, mint, scallions, cucumbers, tomato, radishes, lemon juice, olive oil, salt and  pepper.

## 2020-08-07 LAB — HEPATITIS C ANTIBODY
Hepatitis C Ab: NONREACTIVE
SIGNAL TO CUT-OFF: 0 (ref <1.00)

## 2020-08-09 ENCOUNTER — Telehealth: Payer: Self-pay | Admitting: Family Medicine

## 2020-08-09 DIAGNOSIS — F819 Developmental disorder of scholastic skills, unspecified: Secondary | ICD-10-CM | POA: Diagnosis not present

## 2020-08-09 NOTE — Telephone Encounter (Signed)
Patient called wanting to know the results from his appointment on 06/10. I advised the patient that the results was sent to him in the mail on 06/14.  He also wanted to know if heat can cause your stomach to feel queasy because he works out in the heat and he drinks a lot of water and his left side has been getting muscle cramps.  Please advise

## 2020-08-10 ENCOUNTER — Telehealth: Payer: Self-pay | Admitting: Family Medicine

## 2020-08-10 NOTE — Telephone Encounter (Signed)
Patient is calling and wanted to see of the provider can increase the dosage for rOPINIRole (REQUIP) 0.25 MG tablet to 2 tablets a day, please advise. CB is [phone]

## 2020-08-14 NOTE — Telephone Encounter (Signed)
I called and spoke with patient. We went over information below & he verbalized understanding.

## 2020-08-14 NOTE — Telephone Encounter (Signed)
Heat exposure could cause nausea and muscle cramps. Avoid working out in the middle of the day when temps are usually higher, do so early in the morning and/or indoors. Continue adequate hydration. Thanks, BJ

## 2020-08-14 NOTE — Telephone Encounter (Signed)
I called and spoke with patient. He verbalized understanding of message below.

## 2020-08-14 NOTE — Telephone Encounter (Signed)
For now I prefer to continue same dose of Requip.  If problem is worse, he could discuss symptoms with his neurologist. We can also re-evaluate problem in 2-3 months. Thanks, BJ

## 2020-08-15 ENCOUNTER — Telehealth: Payer: Self-pay | Admitting: Neurology

## 2020-08-15 MED ORDER — ROPINIROLE HCL 0.5 MG PO TABS: 0.5000 mg | ORAL_TABLET | Freq: Every day | ORAL | 1 refills | Status: DC

## 2020-08-15 MED ORDER — NORTRIPTYLINE HCL 25 MG PO CAPS: 25.0000 mg | ORAL_CAPSULE | Freq: Every day | ORAL | 0 refills | Status: DC

## 2020-08-15 MED ORDER — NORTRIPTYLINE HCL 50 MG PO CAPS: 50.0000 mg | ORAL_CAPSULE | Freq: Every day | ORAL | 3 refills | Status: DC

## 2020-08-15 NOTE — Progress Notes (Signed)
Cardiology Office Note   Date:  08/16/2020   ID:  Jodi Kappes, DOB 1965-08-16, MRN [id number]  PCP:  Martinique, Betty G, MD  Cardiologist:   Minus Breeding, MD   Chief Complaint  Patient presents with   PVCs       History of Present Illness: Steven Vincent is a 55 y.o. male who presents for follow up of PVCs and bradycardia.   POET (Plain Old Exercise Treadmill)  in January 2018 was normal. Holter in October 2018 showed frequent PVCs with burden 16%. Average HR 66. Slowest HR 40 while asleep. Beta blocker was reduced and eventually stopped. Echo was normal.   In 2019 Lexiscan  Myoview showed a small fixed apical defect and was otherwise normal. He had repeat Holter in December 2019 showing again frequent PVCs with a burden of 26%. 4 beat run NSVT. Minimum HR 58.  He was seen in the ED on March 9 with concern about slow HR, chest pain, and fatigue. Noted to have pulse of 40 by pulse ox reading. ECG showed frequent PVCs with bigeminy and couplets.   He was eventually treated with Multaq.  He had much improvement in symptoms.    At the last visit he had chest pain.   He had a low risk perfusion study.  Since I last saw him he moved to [place] but moved back.  He is working for [employer] and pushes [employer]. The patient denies any new symptoms such as chest discomfort, neck or arm discomfort. There has been no new shortness of breath, PND or orthopnea. There have been no reported palpitations, presyncope or syncope.  He had some intermittent chest discomfort sporadically and has some pain in his groin intermittently but otherwise feels well.  In particular he is not feeling his PVCs.      Past Medical History:  Diagnosis Date   Arthritis    Baker's cyst    Headache(784.0)    Knee pain    Low back pain    Migraines    OSA (obstructive sleep apnea) 04/10/2017   Personality disorder ([place])    Seizures ([place])     Past Surgical History:  Procedure Laterality Date   TOOTH  EXTRACTION  12/2014   lower left     Current Outpatient Medications  Medication Sig Dispense Refill   Erenumab-aooe (AIMOVIG) 140 MG/ML SOAJ Inject 140 mg into the skin every 30 (thirty) days. 3 mL 3   meloxicam (MOBIC) 15 MG tablet TAKE 1 TABLET DAILY. IF NEEDED FOR PAIN. 90 tablet 0   MULTAQ 400 MG tablet TAKE 1 TABLET TWICE DAILY WITH MEALS 180 tablet 3   nortriptyline (PAMELOR) 25 MG capsule Take 1 capsule (25 mg total) by mouth at bedtime. Take 1 at night for 2 weeks then stop 15 capsule 0   nortriptyline (PAMELOR) 50 MG capsule Take 1 capsule (50 mg total) by mouth at bedtime. 90 capsule 3   pravastatin (PRAVACHOL) 20 MG tablet Take 1 tablet (20 mg total) by mouth daily. 90 tablet 3   rOPINIRole (REQUIP) 0.5 MG tablet Take 1 tablet (0.5 mg total) by mouth at bedtime. 90 tablet 1   SUMAtriptan 6 MG/0.5ML SOAJ INJECT 6 MG INTO THE SKIN DAILY AS NEEDED AT MIGRAINE ONSET. MAY REPEAT 1 TIME AFTER 1 HOUR IF NEEDED 0.5 mL 0   topiramate (TOPAMAX) 50 MG tablet Take one tablet in the AM and two tablets in the PM. 270 tablet 3   No  current facility-administered medications for this visit.    Allergies:   Delsym [dextromethorphan], Aspirin, and Mucinex d [pseudoephedrine-guaifenesin er]    ROS:  Please see the history of present illness.   Otherwise, review of systems are positive for none.   All other systems are reviewed and negative.    PHYSICAL EXAM: VS:  BP 125/76   Pulse 84   Ht 6\' 2"  (1.88 m)   Wt 269 lb (122 kg)   SpO2 99%   BMI 34.54 kg/m  , BMI Body mass index is 34.54 kg/m. GENERAL:  Well appearing NECK:  No jugular venous distention, waveform within normal limits, carotid upstroke brisk and symmetric, no bruits, no thyromegaly LUNGS:  Clear to auscultation bilaterally CHEST:  Unremarkable HEART:  PMI not displaced or sustained,S1 and S2 within normal limits, no S3, no S4, no clicks, no rubs, no murmurs ABD:  Flat, positive bowel sounds normal in frequency in pitch, no  bruits, no rebound, no guarding, no midline pulsatile mass, no hepatomegaly, no splenomegaly EXT:  2 plus pulses throughout, no edema, no cyanosis no clubbing    EKG:  EKG is  ordered today. The ekg ordered today demonstrates normal sinus rhythm, rate 77, axis within normal limits, intervals within normal limits, no acute ST-T wave changes.   Recent Labs: 01/13/2020: Hemoglobin 14.4; Platelets 161 02/09/2020: TSH 3.04 08/04/2020: ALT 23; BUN 25; Creatinine, Ser 0.93; Potassium 4.5; Sodium 141    Lipid Panel    Component Value Date/Time   CHOL 146 08/04/2020 0953   TRIG 119.0 08/04/2020 0953   TRIG 231 (HH) 12/27/2005 1005   HDL 38.10 (L) 08/04/2020 0953   CHOLHDL 4 08/04/2020 0953   VLDL 23.8 08/04/2020 0953   LDLCALC 84 08/04/2020 0953   LDLDIRECT 70.0 07/07/2018 0833      Wt Readings from Last 3 Encounters:  08/16/20 269 lb (122 kg)  08/04/20 270 lb (122.5 kg)  04/26/20 270 lb (122.5 kg)      Other studies Reviewed: Additional studies/ records that were reviewed today include: Labs Review of the above records demonstrates: NA   ASSESSMENT AND PLAN:  Frequent PVCs:    He is tolerating the Multaq.  EKG was unchanged from previous.  Up-to-date with labs.  No change in therapy.   OSA:   He wears CPAP.    CHEST PAIN: His chest pain is a stable pattern.   It is nonanginal.  No change in therapy.  FATIGUE:   He is not anemic.   This is on blood work late last year.  He also had thyroid studies were unremarkable.  No clear etiology is identified.      Current medicines are reviewed at length with the patient today.  The patient does not have concerns regarding medicines.  The following changes have been made:  None  Labs/ tests ordered today include:   No orders of the defined types were placed in this encounter.    Disposition:   FU with APP in six months.     Signed, Minus Breeding, MD  08/16/2020 9:31 AM    [facility]

## 2020-08-15 NOTE — Telephone Encounter (Signed)
I called the patient.  He is having constipation on nortriptyline at 100 mg at night.  He is taking it for sleep but it is not helping.  We can try to reduce the dose, he claims that he has been on this medication for quite some time, we will go to 75 mg daily for 2 weeks and then go to 50 mg daily.  I will send in a 2-week supply of the 25 mg capsules.  He is still having a lot of troubles with restless legs, will go to 0.5 mg of the Mirapex.

## 2020-08-15 NOTE — Addendum Note (Signed)
Addended by: Kathrynn Ducking on: 08/15/2020 04:10 PM   Modules accepted: Orders

## 2020-08-15 NOTE — Telephone Encounter (Signed)
Pt has called to report that the nortriptyline (PAMELOR) 50 MG capsule makes him constipated. Pt also wants to discuss an an increase in his RLS medication due to left side of body twitching. Please call

## 2020-08-16 ENCOUNTER — Ambulatory Visit (INDEPENDENT_AMBULATORY_CARE_PROVIDER_SITE_OTHER): Payer: Medicare HMO | Admitting: Cardiology

## 2020-08-16 ENCOUNTER — Other Ambulatory Visit: Payer: Self-pay

## 2020-08-16 ENCOUNTER — Encounter: Payer: Self-pay | Admitting: Cardiology

## 2020-08-16 VITALS — BP 125/76 | HR 84 | Ht 74.0 in | Wt 269.0 lb

## 2020-08-16 DIAGNOSIS — R5383 Other fatigue: Secondary | ICD-10-CM | POA: Diagnosis not present

## 2020-08-16 DIAGNOSIS — R079 Chest pain, unspecified: Secondary | ICD-10-CM | POA: Diagnosis not present

## 2020-08-16 DIAGNOSIS — I493 Ventricular premature depolarization: Secondary | ICD-10-CM

## 2020-08-16 NOTE — Patient Instructions (Signed)
Medication Instructions:  Your physician recommends that you continue on your current medications as directed. Please refer to the Current Medication list given to you today.  *If you need a refill on your cardiac medications before your next appointment, please call your pharmacy*   Lab Work: None ordered.   Testing/Procedures: None ordered.   Follow-Up: At [facility], you and your health needs are our priority.  As part of our continuing mission to provide you with exceptional heart care, we have created designated Provider Care Teams.  These Care Teams include your primary Cardiologist (physician) and Advanced Practice Providers (APPs -  Physician Assistants and Nurse Practitioners) who all work together to provide you with the care you need, when you need it.  We recommend signing up for the patient portal called "MyChart".  Sign up information is provided on this After Visit Summary.  MyChart is used to connect with patients for Virtual Visits (Telemedicine).  Patients are able to view lab/test results, encounter notes, upcoming appointments, etc.  Non-urgent messages can be sent to your provider as well.   To learn more about what you can do with MyChart, go to [email or web address].    Your next appointment:   6 month(s)  The format for your next appointment:   In Person  Provider:   You will see one of the following Advanced Practice Providers on your designated Care Team:   Rosaria Ferries, PA-C Jory Sims, DNP, ANP

## 2020-08-21 ENCOUNTER — Telehealth: Payer: Self-pay | Admitting: Neurology

## 2020-08-21 MED ORDER — NORTRIPTYLINE HCL 50 MG PO CAPS: 50.0000 mg | ORAL_CAPSULE | Freq: Every day | ORAL | 3 refills | Status: DC

## 2020-08-21 NOTE — Telephone Encounter (Signed)
Pt states [facility] has reached out to him asking he calls the office to ask that his nortriptyline (PAMELOR) 75 MG capsule & rOPINIRole (REQUIP) 0.5 MG tablet be called in again, something happened on their end

## 2020-08-21 NOTE — Addendum Note (Signed)
Addended by: Brandon Melnick on: 08/21/2020 11:57 AM   Modules accepted: Orders

## 2020-08-21 NOTE — Telephone Encounter (Signed)
I relayed to pt I contacted [employer]lia [facility] at [facility] order pharmacy.  They have the nortriptyline 25mg  caps and the ropinirole 0.5mg  tabs prescriptions.  The nortriptyline 50mg  caps was a no print.  I relayed to pt will resend the nortriptyline 50mg  caps.  He is to take one 25mg  and one 50mg  cap (total 75mg ) po qhs for 2 wks, then will go to 50mg  po qhs. He verbalized understanding.  He is to call back if questions. He will continue the 100mg  nortriptyline po qhs until he receives both strengths (to taper the dose).  He verbalized understanding.

## 2020-08-24 ENCOUNTER — Other Ambulatory Visit: Payer: Self-pay | Admitting: Family Medicine

## 2020-08-24 DIAGNOSIS — M5410 Radiculopathy, site unspecified: Secondary | ICD-10-CM

## 2020-08-25 NOTE — Addendum Note (Signed)
Addended by: Merri Ray A on: 08/25/2020 11:37 AM   Modules accepted: Orders

## 2020-08-29 ENCOUNTER — Telehealth: Payer: Self-pay | Admitting: Family Medicine

## 2020-08-29 MED ORDER — MELOXICAM 15 MG PO TABS: ORAL_TABLET | ORAL | 2 refills | Status: DC

## 2020-08-29 NOTE — Telephone Encounter (Signed)
The patient wanted to let Dr. Martinique know that they are slowly taking him off of nortriptyline (PAMELOR) 50 MG capsule and they won't be putting him back on that medication.  FYI

## 2020-08-29 NOTE — Telephone Encounter (Signed)
fyi

## 2020-08-29 NOTE — Addendum Note (Signed)
Addended by: Nathanial Millman E on: 08/29/2020 10:00 AM   Modules accepted: Orders

## 2020-09-01 DIAGNOSIS — G4733 Obstructive sleep apnea (adult) (pediatric): Secondary | ICD-10-CM | POA: Diagnosis not present

## 2020-09-05 ENCOUNTER — Telehealth: Payer: Self-pay | Admitting: Pharmacist

## 2020-09-05 NOTE — Telephone Encounter (Signed)
Called patient and left voicemail to return the call to follow up from previous CCM visit.  Will try again to call patient later.

## 2020-09-21 ENCOUNTER — Telehealth: Payer: Self-pay | Admitting: Pharmacist

## 2020-09-21 NOTE — Chronic Care Management (AMB) (Signed)
Chronic Care Management Pharmacy Assistant   Name: Steven Vincent  MRN: [id number] DOB: 05-Dec-1965  Reason for Encounter: General Assessment Call   Conditions to be addressed/monitored: HLD  Recent office visits:  08-04-2020 Martinique, Betty G, MD (PCP) - Patient presents for Routine general medical examination and other concerns.No medication changes.  Recent consult visits:  08-16-2020 Minus Breeding, MD ( Cardiology) - Patient presented for premature ventricular contraction and other concerns. No medication changes.  08-02-2020 Normajean Glasgow (Physical Med) - Patient presented for PR INJ DX/THER AGNT PARAVERT FACET JOINT ,IMG GUIDE,LUMBAR/SAC, [place] [facility] visits:  None in previous 6 months  Medications: Outpatient Encounter Medications as of 09/21/2020  Medication Sig   Erenumab-aooe (AIMOVIG) 140 MG/ML SOAJ Inject 140 mg into the skin every 30 (thirty) days.   meloxicam (MOBIC) 15 MG tablet TAKE 1 TABLET EVERY DAY AS NEEDED FOR PAIN   MULTAQ 400 MG tablet TAKE 1 TABLET TWICE DAILY WITH MEALS   nortriptyline (PAMELOR) 25 MG capsule Take 1 capsule (25 mg total) by mouth at bedtime. Take 1 at night for 2 weeks then stop   nortriptyline (PAMELOR) 50 MG capsule Take 1 capsule (50 mg total) by mouth at bedtime.   pravastatin (PRAVACHOL) 20 MG tablet Take 1 tablet (20 mg total) by mouth daily.   rOPINIRole (REQUIP) 0.5 MG tablet Take 1 tablet (0.5 mg total) by mouth at bedtime.   SUMAtriptan 6 MG/0.5ML SOAJ INJECT 6 MG INTO THE SKIN DAILY AS NEEDED AT MIGRAINE ONSET. MAY REPEAT 1 TIME AFTER 1 HOUR IF NEEDED   topiramate (TOPAMAX) 50 MG tablet Take one tablet in the AM and two tablets in the PM.   No facility-administered encounter medications on file as of 09/21/2020.   09/21/2020 Name: Ola Leeder MRN: [id number] DOB: [birth year] Steven Vincent is a 55 y.o. year old male who is a primary care patient of Martinique, Malka So, MD.  Comprehensive medication review performed;  Spoke to patient regarding cholesterol  Lipid Panel    Component Value Date/Time   CHOL 146 08/04/2020 0953   TRIG 119.0 08/04/2020 0953   TRIG 231 (HH) 12/27/2005 1005   HDL 38.10 (L) 08/04/2020 0953   LDLCALC 84 08/04/2020 0953   LDLDIRECT 70.0 07/07/2018 0833    10-year ASCVD risk score: The 10-year ASCVD risk score Mikey Bussing DC Jr., et al., 2013) is: 4.8%   Values used to calculate the score:     Age: 27 years     Sex: Male     Is Non-Hispanic African American: No     Diabetic: No     Tobacco smoker: No     Systolic Blood Pressure: 0000000 mmHg     Is BP treated: No     HDL Cholesterol: 38.1 mg/dL     Total Cholesterol: 146 mg/dL  Current antihyperlipidemic regimen:  Pravastatin 20 mg 1 tablet daily - in morning Previous antihyperlipidemic medications tried: Patent reports none ASCVD risk enhancing conditions: None What recent interventions/DTPs have been made by any provider to improve Cholesterol control since last CPP Visit: Patient reports none Any recent hospitalizations or ED visits since last visit with CPP? No What diet changes have been made to improve Cholesterol?  Patient reports he will have for breakfast cereal and eggs, has been having just eggs lately as he has had some work done on his tooth and has to eat something soft. 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 For dinner he will usually have spaghetti or something similar. What exercise is being done to improve Cholesterol?  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 He will also be competing in the [employer] for golf in September.  Adherence Review: Does the patient have >5 day gap between last estimated fill dates? No   Notes:  Patient reports he has been sleeping better advised by 8 pm he is out for the night, often has to get up to use the bathroom and is reminded of his restless leg syndrome on the left side. He  also reports he has been started on a CPAP and finds it difficult being on his face at night but has been using it. Patient reports he has issues reading and understanding writing, and has been trying to understand how to better shop for his meals and would benefit from some label reading education materials that he can understand to better portion and choose what he is eating. Will research what materials I can forward to him via mail. Patient also reminded of upcoming appointment with Dr [name]   Care Gaps:  CCM F/U Call - Scheduled 01-16-2021 9am AWV - Scheduled 04-06-2021 1:45 COVID vaccines - Overdue Pneumovax - Overdue Zoster vaccine - Overdue   Star Rating Drugs:  Pravastatin '20mg'$  - Last filled 08-14-2020 90DS at [place] Pharmacist Assistant [phone]

## 2020-09-25 DIAGNOSIS — F2089 Other schizophrenia: Secondary | ICD-10-CM | POA: Diagnosis not present

## 2020-09-25 DIAGNOSIS — F819 Developmental disorder of scholastic skills, unspecified: Secondary | ICD-10-CM | POA: Diagnosis not present

## 2020-09-28 ENCOUNTER — Other Ambulatory Visit: Payer: Self-pay

## 2020-09-29 ENCOUNTER — Telehealth: Payer: Self-pay | Admitting: Family Medicine

## 2020-09-29 ENCOUNTER — Ambulatory Visit: Payer: Medicare HMO | Admitting: Family Medicine

## 2020-09-29 NOTE — Telephone Encounter (Signed)
Form received

## 2020-09-29 NOTE — Telephone Encounter (Signed)
Completed, on provider's desk to be reviewed & signed.

## 2020-09-29 NOTE — Telephone Encounter (Signed)
[employer] Ath[employer] Medical Form to be filled out- gave form to Judson Roch.  Call [phone] upon completion.

## 2020-10-02 NOTE — Telephone Encounter (Signed)
Noted, will get provider to sign form before pt arrives.

## 2020-10-02 NOTE — Telephone Encounter (Signed)
PT states that he was told it be ready today and is on his way here to pickup the form. Please advise.

## 2020-10-03 DIAGNOSIS — L270 Generalized skin eruption due to drugs and medicaments taken internally: Secondary | ICD-10-CM | POA: Diagnosis not present

## 2020-10-03 DIAGNOSIS — L299 Pruritus, unspecified: Secondary | ICD-10-CM | POA: Diagnosis not present

## 2020-10-10 ENCOUNTER — Ambulatory Visit (INDEPENDENT_AMBULATORY_CARE_PROVIDER_SITE_OTHER): Payer: Medicare HMO | Admitting: Pulmonary Disease

## 2020-10-10 ENCOUNTER — Encounter: Payer: Self-pay | Admitting: Pulmonary Disease

## 2020-10-10 ENCOUNTER — Telehealth: Payer: Self-pay | Admitting: Neurology

## 2020-10-10 ENCOUNTER — Other Ambulatory Visit: Payer: Self-pay

## 2020-10-10 VITALS — BP 126/72 | HR 73 | Ht 74.0 in | Wt 270.6 lb

## 2020-10-10 DIAGNOSIS — G4733 Obstructive sleep apnea (adult) (pediatric): Secondary | ICD-10-CM | POA: Diagnosis not present

## 2020-10-10 NOTE — Patient Instructions (Signed)
Will arrange for home sleep study and call with results  Ask your dentist about whether you could be a candidate for an oral appliance to treat sleep apnea  Follow up in 6 months

## 2020-10-10 NOTE — Progress Notes (Signed)
[facility]  Chief Complaint  Patient presents with   Follow-up    F/U for OSA. States he has not using his cpap machine due to his living situation.     Constitutional:  BP 126/72   Pulse 73   Ht '6\' 2"'$  (1.88 m)   Wt 270 lb 9.6 oz (122.7 kg)   SpO2 97% Comment: on RA  BMI 34.74 kg/m   Past Medical History:  HA, Low back pain, Migraine HA, Seizures, Personality disorder  Past Surgical History:  His  has a past surgical history that includes Tooth extraction (12/2014).  Brief Summary:  Steven Vincent is a 55 y.o. male former smoker with obstructive sleep apnea.      Subjective:   He is living in a makeshift home and has limited access to electricity.  This will be his living situation for the foreseeable future.  He hasn't been able to use CPAP as a result, but still has machine.  He still snores and stops breathing at night.  He is restless at night and always sleepy and tired during the day.  Physical Exam:   Appearance - well kempt   ENMT - no sinus tenderness, no oral exudate, no LAN, Mallampati 4 airway, no stridor, wears partial dentures  Respiratory - equal breath sounds bilaterally, no wheezing or rales  CV - s1s2 regular rate and rhythm, no murmurs  Ext - no clubbing, no edema  Skin - no rashes  Psych - normal mood and affect    Sleep Tests:  PSG 02/15/17 >> AHI 6.9, SpO2 low 85%.  Supine AHI 10.5, REM AHI 24.3. HST 02/26/18 >> AHI 11.6, SpO2 low 79% HST 07/07/18 >> AHI 7.7, SpO2 low 83% Auto CPAP 11/20/19 to 12/19/19 >> used on 12 of 30 nights with average 2 hrs 9 min.  Average AHI 4.3 with median CPAP 11 and 95 th percentile CPAP 14 cm H2O CPAP titration 01/31/20 >> CPAP 13 >> AHI 0, no REM.  Cardiac Tests:  Echo 04/28/19 >> EF 60 to 65%, grade 1 DD, ascending aorta 40 mm  Social History:  He  reports that he has quit smoking. His smoking use included cigarettes. He has a 1.00 pack-year smoking  history. He has never used smokeless tobacco. He reports that he does not drink alcohol and does not use drugs.  Family History:  His family history includes Alzheimer's disease in his father; Aneurysm in his mother; Heart disease in his father; Hyperlipidemia in his father; Hypertension in his brother and father; Other in his father; Stroke in his brother.     Assessment/Plan:   Obstructive sleep apnea. - he has not been able to use CPAP due to his living situation and lack of electricity supply - reviewed alternative options for treating sleep apnea - will arrange for home sleep study to determine if he might be a candidate for an Inspire device - he will check with his dentist about whether he is a candidate for an oral appliance   Insomnia. - gets nortriptyline from his neurologist   Frequent PVCs. - he has appointment with Dr. Percival Spanish with [facility] cardiology in December 2021 - he has intermittent dyspnea on exertion which I suspect is related to deconditioning; I advised him to d/w PCP or cardiology if this progresses  Time Spent Involved in Patient Care on Day of Examination:  23 minutes  Follow up:   Patient Instructions  Will arrange for home sleep  study and call with results  Ask your dentist about whether you could be a candidate for an oral appliance to treat sleep apnea  Follow up in 6 months   Medication List:   Allergies as of 10/10/2020       Reactions   Delsym [dextromethorphan] Other (See Comments)   Gets loopy/memory loss   Aspirin    GI upset Other reaction(s): Vomiting   Mucinex D [pseudoephedrine-guaifenesin Er] Hypertension        Medication List        Accurate as of October 10, 2020  9:44 AM. If you have any questions, ask your nurse or doctor.          STOP taking these medications    nortriptyline 25 MG capsule Commonly known as: PAMELOR Stopped by: [name], MD   nortriptyline 50 MG capsule Commonly known as:  PAMELOR Stopped by: [name], MD       TAKE these medications    Aimovig 140 MG/ML Soaj Generic drug: Erenumab-aooe Inject 140 mg into the skin every 30 (thirty) days.   meloxicam 15 MG tablet Commonly known as: MOBIC TAKE 1 TABLET EVERY DAY AS NEEDED FOR PAIN   Multaq 400 MG tablet Generic drug: dronedarone TAKE 1 TABLET TWICE DAILY WITH MEALS   pravastatin 20 MG tablet Commonly known as: PRAVACHOL Take 1 tablet (20 mg total) by mouth daily.   rOPINIRole 0.5 MG tablet Commonly known as: Requip Take 1 tablet (0.5 mg total) by mouth at bedtime.   SUMAtriptan 6 MG/0.5ML Soaj INJECT 6 MG INTO THE SKIN DAILY AS NEEDED AT MIGRAINE ONSET. MAY REPEAT 1 TIME AFTER 1 HOUR IF NEEDED   topiramate 50 MG tablet Commonly known as: TOPAMAX Take one tablet in the AM and two tablets in the PM.        Signature:  [name], MD [place] Pager - [phone] 10/10/2020, 9:44 AM

## 2020-10-10 NOTE — Telephone Encounter (Signed)
90-day x 1 sent 08/15/20 by Dr. Jannifer Franklin. Called patient back. He will contact [employer] to schedule delivery.

## 2020-10-10 NOTE — Telephone Encounter (Signed)
Pt is needing a refill sent in for his rOPINIRole (REQUIP) 0.5 MG tablet to the [facility].

## 2020-10-11 ENCOUNTER — Telehealth: Payer: Self-pay | Admitting: Pulmonary Disease

## 2020-10-11 NOTE — Telephone Encounter (Signed)
Sharyn Lull from [facility] is calling to get more information in regards to the pt wearing a CPAP at night and see if he qualifies for a new procedure but he was referring to a mouth guard and she said she was just wanting to know exactly what Dr. [name] was instructing the pt to do.  In OV from yesterday the AVS states; "whether you could be a candidate for an oral appliance to treat sleep apnea"  Pls regard; [phone]

## 2020-10-11 NOTE — Telephone Encounter (Signed)
Spoke with Sharyn Lull at [facility].  They will schedule Mr. Janak for oral appliance assessment.  Please fax my office note and previous sleep studies to [phone] ATTN: Sharyn Lull.

## 2020-10-11 NOTE — Telephone Encounter (Signed)
I have faxed over the last sleep study and last OV note from Dr. [name] to [place] at [place].

## 2020-10-25 DIAGNOSIS — M25561 Pain in right knee: Secondary | ICD-10-CM | POA: Diagnosis not present

## 2020-10-25 DIAGNOSIS — M25562 Pain in left knee: Secondary | ICD-10-CM | POA: Diagnosis not present

## 2020-11-06 ENCOUNTER — Other Ambulatory Visit: Payer: Self-pay | Admitting: Orthopaedic Surgery

## 2020-11-06 DIAGNOSIS — Z01818 Encounter for other preprocedural examination: Secondary | ICD-10-CM

## 2020-11-07 ENCOUNTER — Telehealth: Payer: Self-pay

## 2020-11-07 NOTE — Telephone Encounter (Signed)
Received fax from [name] for pre-operative risk assessment for left knee patella femoral replacement.   Neurologic clearance given by Dr. Jannifer Franklin and faxed back to # [phone]. Confirmation received.

## 2020-11-14 NOTE — Patient Instructions (Addendum)
DUE TO COVID-19 ONLY ONE VISITOR IS ALLOWED TO COME WITH YOU AND STAY IN THE WAITING ROOM ONLY DURING PRE OP AND PROCEDURE.   **NO VISITORS ARE ALLOWED IN THE SHORT STAY AREA OR RECOVERY ROOM!!**  IF YOU WILL BE ADMITTED INTO THE HOSPITAL YOU ARE ALLOWED ONLY TWO SUPPORT PEOPLE DURING VISITATION HOURS ONLY (10AM -8PM)   The support person(s) may change daily. The support person(s) must pass our screening, gel in and out, and wear a mask at all times, including in the patient's room. Patients must also wear a mask when staff or their support person are in the room.  No visitors under the age of 70. Any visitor under the age of 70 must be accompanied by an adult.    COVID SWAB TESTING MUST BE COMPLETED ON:  11/24/20 **MUST PRESENT COMPLETED FORM AT TESTING SITE**    Steven  Vincent (backside of the building) Open 8am-3pm. No appointment needed. You are not required to quarantine, however you are required to wear a well-fitted mask when you are out and around people not in your household.  Hand Hygiene often Do NOT share personal items Notify your provider if you are in close contact with someone who has COVID or you develop fever 100.4 or greater, new onset of sneezing, cough, sore throat, shortness of breath or body aches.       Your procedure is scheduled on: 11/28/20   Report to [facility] Main Entrance    Report to admitting at 7:00 AM   Call this number if you have problems the morning of surgery [phone]   Do not eat food :After Midnight.   May have liquids until 6:45 AM day of surgery  CLEAR LIQUID DIET  Foods Allowed                                                                     Foods Excluded  Water, Black Coffee and tea (no milk or creamer)           liquids that you cannot  Plain Jell-O in any flavor  (No red)                                    see through such as: Fruit ices (not with fruit pulp)                                             milk, soups, orange juice              Iced Popsicles (No red)                                                All solid food  Apple juices Sports drinks like Gatorade (No red) Lightly seasoned clear broth or consume(fat free) Sugar     The day of surgery:  Drink ONE (1) Pre-Surgery Clear Ensure by 6:45 am the morning of surgery. Drink in one sitting. Do not sip.  This drink was given to you during your hospital  pre-op appointment visit. Nothing else to drink after completing the  Pre-Surgery Clear Ensure.          If you have questions, please contact your surgeon's office.     Oral Hygiene is also important to reduce your risk of infection.                                    Remember - BRUSH YOUR TEETH THE MORNING OF SURGERY WITH YOUR REGULAR TOOTHPASTE   Take these medicines the morning of surgery with A SIP OF WATER: Pravastatin, Topamax, Multaq                              You may not have any metal on your body including jewelry, and body piercing             Do not wear lotions, powders, cologne, or deodorant              Men may shave face and neck.   Do not bring valuables to the hospital. [place].   Contacts, dentures or bridgework may not be worn into surgery.   Bring small overnight bag day of surgery.    Please read over the following fact sheets you were given: IF YOU HAVE QUESTIONS ABOUT YOUR PRE-OP INSTRUCTIONS PLEASE CALL [facility] - Preparing for Surgery Before surgery, you can play an important role.  Because skin is not sterile, your skin needs to be as free of germs as possible.  You can reduce the number of germs on your skin by washing with CHG (chlorahexidine gluconate) soap before surgery.  CHG is an antiseptic cleaner which kills germs and bonds with the skin to continue killing germs even after washing. Please DO NOT use if you have an  allergy to CHG or antibacterial soaps.  If your skin becomes reddened/irritated stop using the CHG and inform your nurse when you arrive at Short Stay. Do not shave (including legs and underarms) for at least 48 hours prior to the first CHG shower.  You may shave your face/neck.  Please follow these instructions carefully:  1.  Shower with CHG Soap the night before surgery and the  morning of surgery.  2.  If you choose to wash your hair, wash your hair first as usual with your normal  shampoo.  3.  After you shampoo, rinse your hair and body thoroughly to remove the shampoo.                             4.  Use CHG as you would any other liquid soap.  You can apply chg directly to the skin and wash.  Gently with a scrungie or clean washcloth.  5.  Apply the CHG Soap to your body ONLY FROM THE NECK DOWN.   Do   not use on  face/ open                           Wound or open sores. Avoid contact with eyes, ears mouth and   genitals (private parts).                       Wash face,  Genitals (private parts) with your normal soap.             6.  Wash thoroughly, paying special attention to the area where your    surgery  will be performed.  7.  Thoroughly rinse your body with warm water from the neck down.  8.  DO NOT shower/wash with your normal soap after using and rinsing off the CHG Soap.                9.  Pat yourself dry with a clean towel.            10.  Wear clean pajamas.            11.  Place clean sheets on your bed the night of your first shower and do not  sleep with pets. Day of Surgery : Do not apply any lotions/deodorants the morning of surgery.  Please wear clean clothes to the hospital/surgery center.  FAILURE TO FOLLOW THESE INSTRUCTIONS MAY RESULT IN THE CANCELLATION OF YOUR SURGERY  PATIENT SIGNATURE_________________________________  NURSE SIGNATURE__________________________________  ________________________________________________________________________  WHAT IS A BLOOD  TRANSFUSION? Blood Transfusion Information  A transfusion is the replacement of blood or some of its parts. Blood is made up of multiple cells which provide different functions. Red blood cells carry oxygen and are used for blood loss replacement. White blood cells fight against infection. Platelets control bleeding. Plasma helps clot blood. Other blood products are available for specialized needs, such as hemophilia or other clotting disorders. BEFORE THE TRANSFUSION  Who gives blood for transfusions?  Healthy volunteers who are fully evaluated to make sure their blood is safe. This is blood bank blood. Transfusion therapy is the safest it has ever been in the practice of medicine. Before blood is taken from a donor, a complete history is taken to make sure that person has no history of diseases nor engages in risky social behavior (examples are intravenous drug use or sexual activity with multiple partners). The donor's travel history is screened to minimize risk of transmitting infections, such as malaria. The donated blood is tested for signs of infectious diseases, such as HIV and hepatitis. The blood is then tested to be sure it is compatible with you in order to minimize the chance of a transfusion reaction. If you or a relative donates blood, this is often done in anticipation of surgery and is not appropriate for emergency situations. It takes many days to process the donated blood. RISKS AND COMPLICATIONS Although transfusion therapy is very safe and saves many lives, the main dangers of transfusion include:  Getting an infectious disease. Developing a transfusion reaction. This is an allergic reaction to something in the blood you were given. Every precaution is taken to prevent this. The decision to have a blood transfusion has been considered carefully by your caregiver before blood is given. Blood is not given unless the benefits outweigh the risks. AFTER THE TRANSFUSION Right after  receiving a blood transfusion, you will usually feel much better and more energetic. This is especially true if your red blood cells have  gotten low (anemic). The transfusion raises the level of the red blood cells which carry oxygen, and this usually causes an energy increase. The nurse administering the transfusion will monitor you carefully for complications. HOME CARE INSTRUCTIONS  No special instructions are needed after a transfusion. You may find your energy is better. Speak with your caregiver about any limitations on activity for underlying diseases you may have. SEEK MEDICAL CARE IF:  Your condition is not improving after your transfusion. You develop redness or irritation at the intravenous (IV) site. SEEK IMMEDIATE MEDICAL CARE IF:  Any of the following symptoms occur over the next 12 hours: Shaking chills. You have a temperature by mouth above 102 F (38.9 C), not controlled by medicine. Chest, back, or muscle pain. People around you feel you are not acting correctly or are confused. Shortness of breath or difficulty breathing. Dizziness and fainting. You get a rash or develop hives. You have a decrease in urine output. Your urine turns a dark color or changes to pink, red, or brown. Any of the following symptoms occur over the next 10 days: You have a temperature by mouth above 102 F (38.9 C), not controlled by medicine. Shortness of breath. Weakness after normal activity. The white part of the eye turns yellow (jaundice). You have a decrease in the amount of urine or are urinating less often. Your urine turns a dark color or changes to pink, red, or brown. Document Released: 02/09/2000 Document Revised: 05/06/2011 Document Reviewed: 09/28/2007 ExitCare Patient Information 2014 [place].  _______________________________________________________________________   Incentive Spirometer  An incentive spirometer is a tool that can help keep your lungs clear and  active. This tool measures how well you are filling your lungs with each breath. Taking long deep breaths may help reverse or decrease the chance of developing breathing (pulmonary) problems (especially infection) following: A long period of time when you are unable to move or be active. BEFORE THE PROCEDURE  If the spirometer includes an indicator to show your best effort, your nurse or respiratory therapist will set it to a desired goal. If possible, sit up straight or lean slightly forward. Try not to slouch. Hold the incentive spirometer in an upright position. INSTRUCTIONS FOR USE  Sit on the edge of your bed if possible, or sit up as far as you can in bed or on a chair. Hold the incentive spirometer in an upright position. Breathe out normally. Place the mouthpiece in your mouth and seal your lips tightly around it. Breathe in slowly and as deeply as possible, raising the piston or the ball toward the top of the column. Hold your breath for 3-5 seconds or for as long as possible. Allow the piston or ball to fall to the bottom of the column. Remove the mouthpiece from your mouth and breathe out normally. Rest for a few seconds and repeat Steps 1 through 7 at least 10 times every 1-2 hours when you are awake. Take your time and take a few normal breaths between deep breaths. The spirometer may include an indicator to show your best effort. Use the indicator as a goal to work toward during each repetition. After each set of 10 deep breaths, practice coughing to be sure your lungs are clear. If you have an incision (the cut made at the time of surgery), support your incision when coughing by placing a pillow or rolled up towels firmly against it. Once you are able to get out of bed, walk around indoors and  cough well. You may stop using the incentive spirometer when instructed by your caregiver.  RISKS AND COMPLICATIONS Take your time so you do not get dizzy or light-headed. If you are in pain,  you may need to take or ask for pain medication before doing incentive spirometry. It is harder to take a deep breath if you are having pain. AFTER USE Rest and breathe slowly and easily. It can be helpful to keep track of a log of your progress. Your caregiver can provide you with a simple table to help with this. If you are using the spirometer at home, follow these instructions: [place] IF:  You are having difficultly using the spirometer. You have trouble using the spirometer as often as instructed. Your pain medication is not giving enough relief while using the spirometer. You develop fever of 100.5 F (38.1 C) or higher. SEEK IMMEDIATE MEDICAL CARE IF:  You cough up bloody sputum that had not been present before. You develop fever of 102 F (38.9 C) or greater. You develop worsening pain at or near the incision site. MAKE SURE YOU:  Understand these instructions. Will watch your condition. Will get help right away if you are not doing well or get worse. Document Released: 06/24/2006 Document Revised: 05/06/2011 Document Reviewed: 08/25/2006 [facility] Patient Information 2014 [place], [place].   ________________________________________________________________________

## 2020-11-14 NOTE — Progress Notes (Addendum)
COVID swab appointment: 11/24/20  COVID Vaccine Completed: No Date COVID Vaccine completed: Has received booster: COVID vaccine manufacturer: [employer]   Date of COVID positive in last 90 days: No  PCP - Betty Martinique, MD Cardiologist - Minus Breeding, MD Electrophysiologist - Allegra Lai, MD  Chest x-ray - N/a EKG - 08/16/20 Epic Stress Test - 02/09/20 Epic ECHO - 04/28/19 Epic Cardiac Cath - N/a Pacemaker/ICD device last checked: N/a Spinal Cord Stimulator: N/a  Sleep Study - yes positive,  CPAP - doesn't use machine, has one  Fasting Blood Sugar - N/a Checks Blood Sugar _____ times a day  Blood Thinner Instructions: N/a Aspirin Instructions: Last Dose:  Activity level: Can go up a flight of stairs and perform activities of daily living without stopping and without symptoms of chest pain or shortness of breath. Chronic SOB    Anesthesia review: Cardiac clearance?, OSA, PVCs  Patient denies shortness of breath, fever, cough and chest pain at PAT appointment   Patient verbalized understanding of instructions that were given to them at the PAT appointment. Patient was also instructed that they will need to review over the PAT instructions again at home before surgery.

## 2020-11-15 ENCOUNTER — Telehealth: Payer: Self-pay | Admitting: Cardiology

## 2020-11-15 ENCOUNTER — Encounter (HOSPITAL_COMMUNITY)
Admission: RE | Admit: 2020-11-15 | Discharge: 2020-11-15 | Disposition: A | Discharge status: Home / Self Care | Payer: Medicare HMO | Source: Ambulatory Visit | Attending: Orthopaedic Surgery | Admitting: Orthopaedic Surgery

## 2020-11-15 ENCOUNTER — Encounter (HOSPITAL_COMMUNITY): Payer: Self-pay

## 2020-11-15 ENCOUNTER — Ambulatory Visit (HOSPITAL_COMMUNITY)
Admission: RE | Admit: 2020-11-15 | Discharge: 2020-11-15 | Disposition: A | Discharge status: Home / Self Care | Payer: Medicare HMO | Source: Ambulatory Visit | Attending: Orthopaedic Surgery | Admitting: Orthopaedic Surgery

## 2020-11-15 DIAGNOSIS — R569 Unspecified convulsions: Secondary | ICD-10-CM | POA: Diagnosis not present

## 2020-11-15 DIAGNOSIS — I493 Ventricular premature depolarization: Secondary | ICD-10-CM | POA: Diagnosis not present

## 2020-11-15 DIAGNOSIS — M1712 Unilateral primary osteoarthritis, left knee: Secondary | ICD-10-CM | POA: Insufficient documentation

## 2020-11-15 DIAGNOSIS — Z01818 Encounter for other preprocedural examination: Secondary | ICD-10-CM | POA: Diagnosis not present

## 2020-11-15 DIAGNOSIS — G4733 Obstructive sleep apnea (adult) (pediatric): Secondary | ICD-10-CM | POA: Insufficient documentation

## 2020-11-15 DIAGNOSIS — R0600 Dyspnea, unspecified: Secondary | ICD-10-CM | POA: Diagnosis not present

## 2020-11-15 DIAGNOSIS — Z791 Long term (current) use of non-steroidal anti-inflammatories (NSAID): Secondary | ICD-10-CM | POA: Insufficient documentation

## 2020-11-15 DIAGNOSIS — Z87891 Personal history of nicotine dependence: Secondary | ICD-10-CM | POA: Insufficient documentation

## 2020-11-15 DIAGNOSIS — Z79899 Other long term (current) drug therapy: Secondary | ICD-10-CM | POA: Diagnosis not present

## 2020-11-15 HISTORY — DX: Cardiac arrhythmia, unspecified: I49.9

## 2020-11-15 HISTORY — DX: Malignant (primary) neoplasm, unspecified: C80.1

## 2020-11-15 LAB — TYPE AND SCREEN
ABO/RH(D): O NEG
Antibody Screen: NEGATIVE

## 2020-11-15 LAB — BASIC METABOLIC PANEL
Anion gap: 6 (ref 5–15)
BUN: 22 mg/dL — ABNORMAL HIGH (ref 6–20)
CO2: 22 mmol/L (ref 22–32)
Calcium: 9.2 mg/dL (ref 8.9–10.3)
Chloride: 112 mmol/L — ABNORMAL HIGH (ref 98–111)
Creatinine, Ser: 0.92 mg/dL (ref 0.61–1.24)
GFR, Estimated: >60 mL/min (ref >60)
Glucose, Bld: 90 mg/dL (ref 70–99)
Potassium: 4 mmol/L (ref 3.5–5.1)
Sodium: 140 mmol/L (ref 135–145)

## 2020-11-15 LAB — URINALYSIS, ROUTINE W REFLEX MICROSCOPIC
Bilirubin Urine: NEGATIVE
Glucose, UA: NEGATIVE mg/dL
Hgb urine dipstick: NEGATIVE
Ketones, ur: NEGATIVE mg/dL
Leukocytes,Ua: NEGATIVE
Nitrite: NEGATIVE
Protein, ur: NEGATIVE mg/dL
Specific Gravity, Urine: 1.015 (ref 1.005–1.030)
pH: 6 (ref 5.0–8.0)

## 2020-11-15 LAB — APTT: aPTT: 31 seconds (ref 24–36)

## 2020-11-15 LAB — CBC WITH DIFFERENTIAL/PLATELET
Abs Immature Granulocytes: 0.03 10*3/uL (ref 0.00–0.07)
Basophils Absolute: 0 10*3/uL (ref 0.0–0.1)
Basophils Relative: 1 %
Eosinophils Absolute: 0.4 10*3/uL (ref 0.0–0.5)
Eosinophils Relative: 5 %
HCT: 42.4 % (ref 39.0–52.0)
Hemoglobin: 15 g/dL (ref 13.0–17.0)
Immature Granulocytes: 0 %
Lymphocytes Relative: 36 %
Lymphs Abs: 2.5 10*3/uL (ref 0.7–4.0)
MCH: 32.5 pg (ref 26.0–34.0)
MCHC: 35.4 g/dL (ref 30.0–36.0)
MCV: 92 fL (ref 80.0–100.0)
Monocytes Absolute: 0.7 10*3/uL (ref 0.1–1.0)
Monocytes Relative: 11 %
Neutro Abs: 3.3 10*3/uL (ref 1.7–7.7)
Neutrophils Relative %: 47 %
Platelets: 166 10*3/uL (ref 150–400)
RBC: 4.61 MIL/uL (ref 4.22–5.81)
RDW: 12.5 % (ref 11.5–15.5)
WBC: 6.9 10*3/uL (ref 4.0–10.5)
nRBC: 0 % (ref 0.0–0.2)

## 2020-11-15 LAB — PROTIME-INR
INR: 1.1 (ref 0.8–1.2)
Prothrombin Time: 14.3 seconds (ref 11.4–15.2)

## 2020-11-15 LAB — SURGICAL PCR SCREEN
MRSA, PCR: NEGATIVE
Staphylococcus aureus: POSITIVE — AB

## 2020-11-15 NOTE — Telephone Encounter (Signed)
   Name: Steven Vincent  DOB: [birth year]  MRN: [id number]   Primary Cardiologist: Minus Breeding, MD  Chart reviewed as part of pre-operative protocol coverage. Patient was contacted 11/15/2020 in reference to pre-operative risk assessment for pending surgery as outlined below.  [name] was last seen on 07/2020 by Dr. Percival Spanish. Notes reviewed. Historically followed for frequent PVCs. Last echo 04/2019 EF 60-65%. Last monitor 04/2017 with PVC burden remaining low per Dr. Macky Lower interpretation (8.2%, previously referenced at 26%). Last stress test 01/2020 was low risk, EF 52%. Reached out to pt for assessment of stability and got VM. LTMCB.  The patient is not on any blood thinners that require holding from our standpoint. Await callback.  Charlie Pitter, PA-C 11/15/2020, 12:54 PM

## 2020-11-15 NOTE — Progress Notes (Signed)
PCR result came back STAPH +. Results routed to Dr. Rhona Raider

## 2020-11-15 NOTE — Telephone Encounter (Signed)
   Name: Steven Vincent  DOB: 09/21/1965  MRN: [id number]   Primary Cardiologist: Minus Breeding, MD  Chart reviewed as part of pre-operative protocol coverage. Patient was contacted 11/15/2020 in reference to pre-operative risk assessment for pending surgery as outlined below.  Steven Vincent was last seen on 07/2020 by Dr. Percival Spanish. Notes reviewed. Historically followed for frequent PVCs. Last echo 04/2019 EF 60-65%. Last monitor 04/2017 with PVC burden remaining low per Dr. Macky Lower interpretation (8.2%, previously referenced at 26%). Last stress test 01/2020 was low risk, EF 52%. I reached out to patient for update on how he is doing. The patient affirms he has been doing well without any new cardiac symptoms. He feels he is doing well. He has chronic unchanged mild dyspnea for several years per his report without any recent progression. He has not had any chest pain, palpitations or syncope. He is able to remain active with walking and pushing carts and denies any new or accelerating symptoms. Therefore, based on ACC/AHA guidelines, the patient would be at acceptable risk for the planned procedure without further cardiovascular testing. The patient was advised that if he develops new symptoms prior to surgery to contact our office to arrange for a follow-up visit, and he verbalized understanding.  The patient is not on any blood thinners that require holding from our standpoint.   I will route this recommendation to the requesting party via Epic fax function and remove from pre-op pool. Please call with questions.  Charlie Pitter, PA-C 11/15/2020, 3:58 PM

## 2020-11-15 NOTE — Telephone Encounter (Signed)
Patient is returning call.  °

## 2020-11-15 NOTE — Telephone Encounter (Signed)
   [facility] Pre-operative Risk Assessment    Request for surgical clearance:  What type of surgery is being performed?  Left Knee Patellofemoral Replacement   When is this surgery scheduled?  11/28/20   What type of clearance is required (medical clearance vs. Pharmacy clearance to hold med vs. Both)?  Both   Are there any medications that need to be held prior to surgery and how long? Their office is requesting our recommendation.   Practice name and name of physician performing surgery?  [facility]  Dr. Rhona Raider   What is your office phone number? [phone]    7.   What is your office fax number? [phone]  8.   Anesthesia type (None, local, MAC, general) ?  Spinal   Steven Vincent 11/15/2020, 11:45 AM

## 2020-11-16 NOTE — Progress Notes (Signed)
Anesthesia Chart Review   Case: [id number] Date/Time: 11/28/20 0930   Procedure: LEFT KNEE PATELLAFEMORL REPLACEMENT (Left: Knee)   Anesthesia type: Spinal   Pre-op diagnosis: LEFT KNEE PATELLAFEMORAL DEGENERATIVE JOINT DISEASE   Location: [facility] ROOM 06 / [facility]   Surgeons: Melrose Nakayama, MD       DISCUSSION:55 y.o. former smoker with h/o OSA, seizures, PVCs, left knee patellofemoral djd scheduled for above procedure 11/28/2020 with Dr. Melrose Nakayama.   Per cardiology preoperative evaluation 11/15/2020, "Chart reviewed as part of pre-operative protocol coverage. Patient was contacted 11/15/2020 in reference to pre-operative risk assessment for pending surgery as outlined below.  Steven Vincent was last seen on 07/2020 by Dr. Percival Spanish. Notes reviewed. Historically followed for frequent PVCs. Last echo 04/2019 EF 60-65%. Last monitor 04/2017 with PVC burden remaining low per Dr. Macky Lower interpretation (8.2%, previously referenced at 26%). Last stress test 01/2020 was low risk, EF 52%. I reached out to patient for update on how he is doing. The patient affirms he has been doing well without any new cardiac symptoms. He feels he is doing well. He has chronic unchanged mild dyspnea for several years per his report without any recent progression. He has not had any chest pain, palpitations or syncope. He is able to remain active with walking and pushing carts and denies any new or accelerating symptoms. Therefore, based on ACC/AHA guidelines, the patient would be at acceptable risk for the planned procedure without further cardiovascular testing. The patient was advised that if he develops new symptoms prior to surgery to contact our office to arrange for a follow-up visit, and he verbalized understanding."  Anticipate pt can proceed with planned procedure barring acute status change.   VS: BP (!) 147/87   Pulse 64   Temp 36.7 C (Oral)   Resp 18   Ht 6\' 2"  (1.88 m)   Wt 123.5 kg   SpO2 99%   BMI  34.96 kg/m   PROVIDERS: Martinique, Betty G, MD  Minus Breeding, MD is Cardiologist  LABS: Labs reviewed: Acceptable for surgery. (all labs ordered are listed, but only abnormal results are displayed)  Labs Reviewed  SURGICAL PCR SCREEN - Abnormal; Notable for the following components:      Result Value   Staphylococcus aureus POSITIVE (*)    All other components within normal limits  BASIC METABOLIC PANEL - Abnormal; Notable for the following components:   Chloride 112 (*)    BUN 22 (*)    All other components within normal limits  CBC WITH DIFFERENTIAL/PLATELET  PROTIME-INR  APTT  URINALYSIS, ROUTINE W REFLEX MICROSCOPIC  TYPE AND SCREEN     IMAGES:   EKG: 08/16/20 Rate 77 bpm   CV: Stress Test 02/09/2020 The left ventricular ejection fraction is mildly decreased (45-54%). Nuclear stress EF: 52%. There was no ST segment deviation noted during stress. The study is normal. This is a low risk study wiht no evidence of ischemia.  Echo 04/28/2019  1. Left ventricular ejection fraction, by estimation, is 60 to 65%. Left  ventricular ejection fraction by PLAX is 63 %. The left ventricle has  normal function. The left ventricle has no regional wall motion  abnormalities. Left ventricular diastolic  parameters are consistent with Grade I diastolic dysfunction (impaired  relaxation). Elevated left ventricular end-diastolic pressure.   2. Right ventricular systolic function is normal. The right ventricular  size is normal. There is normal pulmonary artery systolic pressure.   3. The mitral valve is normal in structure  and function. Trivial mitral  valve regurgitation. No evidence of mitral stenosis.   4. The aortic valve is normal in structure and function. Aortic valve  regurgitation is not visualized. No aortic stenosis is present.   5. Aortic dilatation noted. There is mild dilatation of the ascending  aorta measuring 40 mm.   6. The inferior vena cava is normal in size  with greater than 50%  respiratory variability, suggesting right atrial pressure of 3 mmHg. 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   Dysrhythmia    bradycardia   Headache(784.0)    Knee pain    Low back pain    Migraines    OSA (obstructive sleep apnea) 04/10/2017   Personality disorder ([place])    Seizures ([place])     Past Surgical History:  Procedure Laterality Date   TOOTH EXTRACTION  12/2014   lower left    MEDICATIONS:  diclofenac (FLECTOR) 1.3 % PTCH   Erenumab-aooe (AIMOVIG) 140 MG/ML SOAJ   meloxicam (MOBIC) 15 MG tablet   MULTAQ 400 MG tablet   pravastatin (PRAVACHOL) 20 MG tablet   rOPINIRole (REQUIP) 0.5 MG tablet   SUMAtriptan 6 MG/0.5ML SOAJ   topiramate (TOPAMAX) 50 MG tablet   No current facility-administered medications for this encounter.    Konrad Felix Ward, PA-C [facility] Pre-Surgical Testing [phone]

## 2020-11-16 NOTE — Anesthesia Preprocedure Evaluation (Addendum)
Anesthesia Evaluation  Patient identified by MRN, date of birth, ID band Patient awake    Reviewed: Allergy & Precautions, NPO status , Patient's Chart, lab work & pertinent test results  Airway Mallampati: II  TM Distance: >3 FB Neck ROM: Full    Dental  (+) Missing, Dental Advisory Given,    Pulmonary sleep apnea , former smoker,    breath sounds clear to auscultation       Cardiovascular + dysrhythmias  Rhythm:Regular Rate:Normal     Neuro/Psych  Headaches, Seizures -, Well Controlled,  PSYCHIATRIC DISORDERS    GI/Hepatic negative GI ROS, Neg liver ROS,   Endo/Other  negative endocrine ROS  Renal/GU negative Renal ROS     Musculoskeletal   Abdominal Normal abdominal exam  (+)   Peds  Hematology   Anesthesia Other Findings   Reproductive/Obstetrics                           Anesthesia Physical Anesthesia Plan  ASA: 3  Anesthesia Plan: Spinal   Post-op Pain Management:  Regional for Post-op pain   Induction: Intravenous  PONV Risk Score and Plan: 2 and Ondansetron and Propofol infusion  Airway Management Planned: Natural Airway and Simple Face Mask  Additional Equipment: None  Intra-op Plan:   Post-operative Plan:   Informed Consent: I have reviewed the patients History and Physical, chart, labs and discussed the procedure including the risks, benefits and alternatives for the proposed anesthesia with the patient or authorized representative who has indicated his/her understanding and acceptance.     Dental advisory given  Plan Discussed with: CRNA  Anesthesia Plan Comments: (See PAT note 11/15/2020, Konrad Felix Ward, PA-C  Lab Results      Component                Value               Date                      WBC                      6.9                 11/15/2020                HGB                      15.0                11/15/2020                HCT                       42.4                11/15/2020                MCV                      92.0                11/15/2020                PLT                      166  11/15/2020           )      Anesthesia Quick Evaluation

## 2020-11-24 ENCOUNTER — Other Ambulatory Visit: Payer: Self-pay | Admitting: Orthopaedic Surgery

## 2020-11-24 LAB — SARS CORONAVIRUS 2 (TAT 6-24 HRS): SARS Coronavirus 2: NEGATIVE

## 2020-11-27 ENCOUNTER — Encounter (HOSPITAL_COMMUNITY): Payer: Self-pay | Admitting: Orthopaedic Surgery

## 2020-11-27 MED ORDER — TRANEXAMIC ACID 1000 MG/10ML IV SOLN
2000.0000 mg | INTRAVENOUS | Status: DC
  Filled 2020-11-27: qty 20

## 2020-11-27 NOTE — H&P (Signed)
Steven Vincent is an 55 y.o. male.   Chief Complaint: left knee pain HPI: Steven Vincent persists with anterior knee pain on both sides.  We have injected him for years in both knees and he seems to get a few months of relief in general.  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  He has trouble resting at night and cannot do stairs.    Radiographs:  X-rays that were ordered, performed, and interpreted by me tan 10/25/20 of each knee demonstrate advanced patellofemoral degenerative changes with subchondral sclerosis and osteophyte formation.  His other compartments appeared benign bilaterally.  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   Dysrhythmia    bradycardia   Headache(784.0)    Knee pain    Low back pain    Migraines    OSA (obstructive sleep apnea) 04/10/2017   Personality disorder ([place])    Seizures ([place])     Past Surgical History:  Procedure Laterality Date   TOOTH EXTRACTION  12/2014   lower left    Family History  Problem Relation Age of Onset   Heart disease Father        defibrilator   Hyperlipidemia Father    Hypertension Father    Alzheimer's disease Father    Other Father        stents in legs   Aneurysm Mother        brain   Hypertension Brother    Stroke Brother        X2   Social History:  reports that he has quit smoking. His smoking use included cigarettes. He has a 1.00 pack-year smoking history. He has never used smokeless tobacco. He reports that he does not drink alcohol and does not use drugs.  Allergies:  Allergies  Allergen Reactions   Delsym [Dextromethorphan] Other (See Comments)    Gets loopy/memory loss   Aspirin     GI upset Other reaction(s): Vomiting   Mucinex D [Pseudoephedrine-Guaifenesin Er] Hypertension   Ibuprofen Other (See Comments)    Stomach irritation    No medications prior to admission.     No results found for this or any previous visit (from the past 48 hour(s)). No results found.  Review of Systems  Musculoskeletal:  Positive for arthralgias.       Left knee  All other systems reviewed and are negative.  There were no vitals taken for this visit. Physical Exam Constitutional:      Appearance: Normal appearance.  HENT:     Head: Normocephalic and atraumatic.     Nose: Nose normal.     Mouth/Throat:     Pharynx: Oropharynx is clear.  Eyes:     Extraocular Movements: Extraocular movements intact.  Cardiovascular:     Rate and Rhythm: Normal rate and regular rhythm.  Pulmonary:     Effort: Pulmonary effort is normal.  Abdominal:     Palpations: Abdomen is soft.  Musculoskeletal:     Comments: Both knees move about 0-120.  He has patellofemoral pain and crepitation.  I do not feel an effusion today.  Hip motion is full and straight leg raise is negative.  He walks with an altered gait.  Sensation and motor function are intact in his feet with palpable pulses on both sides.   Neurological:  General: No focal deficit present.     Mental Status: He is alert and oriented to person, place, and time. Mental status is at baseline.  Psychiatric:        Mood and Affect: Mood normal.        Behavior: Behavior normal.     Assessment/Plan Assessment:  left patellofemoral DJD with MRI 2018, Orthovisc 2019, injected most recently 06/08/20  Plan: Steven Vincent has some terrible knee pain which is not responding as well to the cortisone injections.  He has arthritis confined to the patellofemoral portion of the joints.  He is having to take oral anti-inflammatories which is not something he should do with his renal issues.  I told him we could perform patellofemoral replacement at least on the left.  We would probably do this at the main hospital and keep him overnight considering he does not have much help at home.  He thinks he can arrange for friends from his church to be with  him for awhile.  I reviewed risk of anesthesia, infection, DVT and the importance of some postoperative PT.   Steven Sachs Hagop Mccollam, PA-C 11/27/2020, 5:42 PM

## 2020-11-28 ENCOUNTER — Observation Stay (HOSPITAL_COMMUNITY)
Admission: RE | Admit: 2020-11-28 | Discharge: 2020-11-29 | Disposition: A | Discharge status: Home / Self Care | Payer: Medicare HMO | Attending: Orthopaedic Surgery | Admitting: Orthopaedic Surgery

## 2020-11-28 ENCOUNTER — Encounter (HOSPITAL_COMMUNITY)
Admission: RE | Disposition: A | Discharge status: Home / Self Care | Payer: Self-pay | Source: Home / Self Care | Attending: Orthopaedic Surgery

## 2020-11-28 ENCOUNTER — Encounter (HOSPITAL_COMMUNITY): Payer: Self-pay | Admitting: Orthopaedic Surgery

## 2020-11-28 ENCOUNTER — Ambulatory Visit (HOSPITAL_COMMUNITY): Payer: Medicare HMO | Admitting: Anesthesiology

## 2020-11-28 ENCOUNTER — Other Ambulatory Visit: Payer: Self-pay

## 2020-11-28 ENCOUNTER — Ambulatory Visit (HOSPITAL_COMMUNITY): Payer: Medicare HMO | Admitting: Physician Assistant

## 2020-11-28 DIAGNOSIS — G8918 Other acute postprocedural pain: Secondary | ICD-10-CM | POA: Diagnosis not present

## 2020-11-28 DIAGNOSIS — G4733 Obstructive sleep apnea (adult) (pediatric): Secondary | ICD-10-CM | POA: Diagnosis not present

## 2020-11-28 DIAGNOSIS — Z85828 Personal history of other malignant neoplasm of skin: Secondary | ICD-10-CM | POA: Insufficient documentation

## 2020-11-28 DIAGNOSIS — M1712 Unilateral primary osteoarthritis, left knee: Principal | ICD-10-CM | POA: Insufficient documentation

## 2020-11-28 DIAGNOSIS — Z87891 Personal history of nicotine dependence: Secondary | ICD-10-CM | POA: Diagnosis not present

## 2020-11-28 DIAGNOSIS — G43909 Migraine, unspecified, not intractable, without status migrainosus: Secondary | ICD-10-CM | POA: Diagnosis not present

## 2020-11-28 HISTORY — PX: TOTAL KNEE ARTHROPLASTY: SHX125

## 2020-11-28 LAB — ABO/RH: ABO/RH(D): O NEG

## 2020-11-28 SURGERY — ARTHROPLASTY, KNEE, TOTAL
Anesthesia: Spinal | Site: Knee | Laterality: Left

## 2020-11-28 MED ORDER — METOCLOPRAMIDE HCL 5 MG PO TABS: 5.0000 mg | ORAL_TABLET | Freq: Three times a day (TID) | ORAL | Status: DC | PRN

## 2020-11-28 MED ORDER — MIDAZOLAM HCL 5 MG/5ML IJ SOLN
INTRAMUSCULAR | Status: DC | PRN
  Administered 2020-11-28: 1 mg via INTRAVENOUS

## 2020-11-28 MED ORDER — METHOCARBAMOL 500 MG IVPB - SIMPLE MED
INTRAVENOUS | Status: AC
  Filled 2020-11-28: qty 50

## 2020-11-28 MED ORDER — BUPIVACAINE IN DEXTROSE 0.75-8.25 % IT SOLN
INTRATHECAL | Status: DC | PRN
  Administered 2020-11-28: 1.6 mL via INTRATHECAL

## 2020-11-28 MED ORDER — ONDANSETRON HCL 4 MG/2ML IJ SOLN
INTRAMUSCULAR | Status: AC
  Filled 2020-11-28: qty 2

## 2020-11-28 MED ORDER — MIDAZOLAM HCL 2 MG/2ML IJ SOLN: 1.0000 mg | Freq: Once | INTRAMUSCULAR | Status: AC

## 2020-11-28 MED ORDER — MORPHINE SULFATE (PF) 2 MG/ML IV SOLN: 0.5000 mg | INTRAVENOUS | Status: DC | PRN

## 2020-11-28 MED ORDER — ORAL CARE MOUTH RINSE: 15.0000 mL | Freq: Once | OROMUCOSAL | Status: AC

## 2020-11-28 MED ORDER — BUPIVACAINE LIPOSOME 1.3 % IJ SUSP
INTRAMUSCULAR | Status: DC | PRN
  Administered 2020-11-28: 20 mL

## 2020-11-28 MED ORDER — BUPIVACAINE LIPOSOME 1.3 % IJ SUSP
INTRAMUSCULAR | Status: AC
  Filled 2020-11-28: qty 10

## 2020-11-28 MED ORDER — PROPOFOL 500 MG/50ML IV EMUL
INTRAVENOUS | Status: DC | PRN
  Administered 2020-11-28: 80 ug/kg/min via INTRAVENOUS

## 2020-11-28 MED ORDER — ONDANSETRON HCL 4 MG/2ML IJ SOLN
INTRAMUSCULAR | Status: DC | PRN
  Administered 2020-11-28: 4 mg via INTRAVENOUS

## 2020-11-28 MED ORDER — ONDANSETRON HCL 4 MG/2ML IJ SOLN: 4.0000 mg | Freq: Four times a day (QID) | INTRAMUSCULAR | Status: DC | PRN

## 2020-11-28 MED ORDER — DRONEDARONE HCL 400 MG PO TABS
400.0000 mg | ORAL_TABLET | Freq: Two times a day (BID) | ORAL | Status: DC
  Administered 2020-11-28 – 2020-11-29 (×2): 400 mg via ORAL
  Filled 2020-11-28 (×2): qty 1

## 2020-11-28 MED ORDER — TRANEXAMIC ACID 1000 MG/10ML IV SOLN
INTRAVENOUS | Status: DC | PRN
  Administered 2020-11-28: 2000 mg via TOPICAL

## 2020-11-28 MED ORDER — PROPOFOL 10 MG/ML IV BOLUS
INTRAVENOUS | Status: DC | PRN
  Administered 2020-11-28: 20 mg via INTRAVENOUS

## 2020-11-28 MED ORDER — DOCUSATE SODIUM 100 MG PO CAPS
100.0000 mg | ORAL_CAPSULE | Freq: Two times a day (BID) | ORAL | Status: DC
  Administered 2020-11-28 – 2020-11-29 (×2): 100 mg via ORAL
  Filled 2020-11-28 (×2): qty 1

## 2020-11-28 MED ORDER — HYDROCODONE-ACETAMINOPHEN 5-325 MG PO TABS: 1.0000 | ORAL_TABLET | ORAL | Status: DC | PRN

## 2020-11-28 MED ORDER — PROPOFOL 10 MG/ML IV BOLUS
INTRAVENOUS | Status: AC
  Filled 2020-11-28: qty 20

## 2020-11-28 MED ORDER — DIPHENHYDRAMINE HCL 12.5 MG/5ML PO ELIX: 12.5000 mg | ORAL_SOLUTION | ORAL | Status: DC | PRN

## 2020-11-28 MED ORDER — PHENOL 1.4 % MT LIQD: 1.0000 | OROMUCOSAL | Status: DC | PRN

## 2020-11-28 MED ORDER — LACTATED RINGERS IV SOLN: INTRAVENOUS | Status: DC

## 2020-11-28 MED ORDER — PROMETHAZINE HCL 25 MG/ML IJ SOLN: 6.2500 mg | INTRAMUSCULAR | Status: DC | PRN

## 2020-11-28 MED ORDER — TOPIRAMATE 25 MG PO TABS
50.0000 mg | ORAL_TABLET | Freq: Every day | ORAL | Status: DC
  Administered 2020-11-29: 50 mg via ORAL
  Filled 2020-11-28: qty 2

## 2020-11-28 MED ORDER — SODIUM CHLORIDE (PF) 0.9 % IJ SOLN
INTRAMUSCULAR | Status: DC | PRN
  Administered 2020-11-28: 50 mL

## 2020-11-28 MED ORDER — BUPIVACAINE-EPINEPHRINE 0.5% -1:200000 IJ SOLN
INTRAMUSCULAR | Status: DC | PRN
  Administered 2020-11-28: 30 mL

## 2020-11-28 MED ORDER — METHOCARBAMOL 500 MG PO TABS: 500.0000 mg | ORAL_TABLET | Freq: Four times a day (QID) | ORAL | Status: DC | PRN

## 2020-11-28 MED ORDER — ACETAMINOPHEN 325 MG PO TABS
325.0000 mg | ORAL_TABLET | Freq: Once | ORAL | Status: AC | PRN
  Administered 2020-11-28: 650 mg via ORAL

## 2020-11-28 MED ORDER — AMISULPRIDE (ANTIEMETIC) 5 MG/2ML IV SOLN: 10.0000 mg | Freq: Once | INTRAVENOUS | Status: DC | PRN

## 2020-11-28 MED ORDER — ACETAMINOPHEN 10 MG/ML IV SOLN: 1000.0000 mg | Freq: Once | INTRAVENOUS | Status: DC | PRN

## 2020-11-28 MED ORDER — DEXAMETHASONE SODIUM PHOSPHATE 10 MG/ML IJ SOLN
INTRAMUSCULAR | Status: AC
  Filled 2020-11-28: qty 1

## 2020-11-28 MED ORDER — LIDOCAINE 2% (20 MG/ML) 5 ML SYRINGE
INTRAMUSCULAR | Status: DC | PRN
  Administered 2020-11-28 (×2): 50 mg via INTRAVENOUS

## 2020-11-28 MED ORDER — PRAVASTATIN SODIUM 20 MG PO TABS
20.0000 mg | ORAL_TABLET | Freq: Every day | ORAL | Status: DC
  Administered 2020-11-29: 20 mg via ORAL
  Filled 2020-11-28: qty 1

## 2020-11-28 MED ORDER — BUPIVACAINE LIPOSOME 1.3 % IJ SUSP: 20.0000 mL | Freq: Once | INTRAMUSCULAR | Status: DC

## 2020-11-28 MED ORDER — BISACODYL 5 MG PO TBEC: 5.0000 mg | DELAYED_RELEASE_TABLET | Freq: Every day | ORAL | Status: DC | PRN

## 2020-11-28 MED ORDER — ALUM & MAG HYDROXIDE-SIMETH 200-200-20 MG/5ML PO SUSP: 30.0000 mL | ORAL | Status: DC | PRN

## 2020-11-28 MED ORDER — ONDANSETRON HCL 4 MG PO TABS: 4.0000 mg | ORAL_TABLET | Freq: Four times a day (QID) | ORAL | Status: DC | PRN

## 2020-11-28 MED ORDER — DEXAMETHASONE SODIUM PHOSPHATE 10 MG/ML IJ SOLN
INTRAMUSCULAR | Status: DC | PRN
  Administered 2020-11-28: 4 mg via INTRAVENOUS

## 2020-11-28 MED ORDER — ACETAMINOPHEN 500 MG PO TABS
500.0000 mg | ORAL_TABLET | Freq: Four times a day (QID) | ORAL | Status: DC
  Administered 2020-11-28 – 2020-11-29 (×3): 500 mg via ORAL
  Filled 2020-11-28 (×3): qty 1

## 2020-11-28 MED ORDER — CHLORHEXIDINE GLUCONATE 0.12 % MT SOLN
15.0000 mL | Freq: Once | OROMUCOSAL | Status: AC
Start: 2020-11-28 — End: 2020-11-28
  Administered 2020-11-28: 15 mL via OROMUCOSAL

## 2020-11-28 MED ORDER — LIDOCAINE HCL (PF) 2 % IJ SOLN
INTRAMUSCULAR | Status: AC
  Filled 2020-11-28: qty 5

## 2020-11-28 MED ORDER — ACETAMINOPHEN 325 MG PO TABS: 325.0000 mg | ORAL_TABLET | Freq: Four times a day (QID) | ORAL | Status: DC | PRN

## 2020-11-28 MED ORDER — PHENYLEPHRINE HCL-NACL 20-0.9 MG/250ML-% IV SOLN
INTRAVENOUS | Status: DC | PRN
  Administered 2020-11-28: 15 ug/min via INTRAVENOUS

## 2020-11-28 MED ORDER — METHOCARBAMOL 500 MG IVPB - SIMPLE MED
500.0000 mg | Freq: Four times a day (QID) | INTRAVENOUS | Status: DC | PRN
  Administered 2020-11-28: 500 mg via INTRAVENOUS
  Filled 2020-11-28: qty 50

## 2020-11-28 MED ORDER — CEFAZOLIN SODIUM-DEXTROSE 2-4 GM/100ML-% IV SOLN
2.0000 g | Freq: Four times a day (QID) | INTRAVENOUS | Status: AC
  Administered 2020-11-28 (×2): 2 g via INTRAVENOUS
  Filled 2020-11-28 (×2): qty 100

## 2020-11-28 MED ORDER — MIDAZOLAM HCL 2 MG/2ML IJ SOLN
INTRAMUSCULAR | Status: AC
  Filled 2020-11-28: qty 2

## 2020-11-28 MED ORDER — FENTANYL CITRATE PF 50 MCG/ML IJ SOSY
PREFILLED_SYRINGE | INTRAMUSCULAR | Status: AC
  Administered 2020-11-28: 50 ug via INTRAVENOUS
  Filled 2020-11-28: qty 2

## 2020-11-28 MED ORDER — TRANEXAMIC ACID-NACL 1000-0.7 MG/100ML-% IV SOLN
1000.0000 mg | INTRAVENOUS | Status: AC
  Administered 2020-11-28: 1000 mg via INTRAVENOUS
  Filled 2020-11-28: qty 100

## 2020-11-28 MED ORDER — TOPIRAMATE 100 MG PO TABS
100.0000 mg | ORAL_TABLET | Freq: Every day | ORAL | Status: DC
  Administered 2020-11-28: 100 mg via ORAL
  Filled 2020-11-28: qty 1

## 2020-11-28 MED ORDER — CEFAZOLIN IN SODIUM CHLORIDE 3-0.9 GM/100ML-% IV SOLN
3.0000 g | INTRAVENOUS | Status: AC
  Administered 2020-11-28: 3 g via INTRAVENOUS
  Filled 2020-11-28: qty 100

## 2020-11-28 MED ORDER — MENTHOL 3 MG MT LOZG: 1.0000 | LOZENGE | OROMUCOSAL | Status: DC | PRN

## 2020-11-28 MED ORDER — MEPERIDINE HCL 50 MG/ML IJ SOLN: 6.2500 mg | INTRAMUSCULAR | Status: DC | PRN

## 2020-11-28 MED ORDER — HYDROCODONE-ACETAMINOPHEN 7.5-325 MG PO TABS: 1.0000 | ORAL_TABLET | ORAL | Status: DC | PRN

## 2020-11-28 MED ORDER — ACETAMINOPHEN 325 MG PO TABS
ORAL_TABLET | ORAL | Status: AC
  Filled 2020-11-28: qty 1

## 2020-11-28 MED ORDER — HYDROMORPHONE HCL 1 MG/ML IJ SOLN: 0.2500 mg | INTRAMUSCULAR | Status: DC | PRN

## 2020-11-28 MED ORDER — 0.9 % SODIUM CHLORIDE (POUR BTL) OPTIME
TOPICAL | Status: DC | PRN
  Administered 2020-11-28: 1000 mL

## 2020-11-28 MED ORDER — ROPIVACAINE HCL 5 MG/ML IJ SOLN
INTRAMUSCULAR | Status: DC | PRN
  Administered 2020-11-28: 30 mL via PERINEURAL

## 2020-11-28 MED ORDER — STERILE WATER FOR IRRIGATION IR SOLN
Status: DC | PRN
  Administered 2020-11-28: 2000 mL

## 2020-11-28 MED ORDER — KETOROLAC TROMETHAMINE 15 MG/ML IJ SOLN
INTRAMUSCULAR | Status: AC
  Filled 2020-11-28: qty 1

## 2020-11-28 MED ORDER — TRANEXAMIC ACID-NACL 1000-0.7 MG/100ML-% IV SOLN
1000.0000 mg | Freq: Once | INTRAVENOUS | Status: AC
Start: 2020-11-28 — End: 2020-11-29
  Administered 2020-11-28: 1000 mg via INTRAVENOUS
  Filled 2020-11-28: qty 100

## 2020-11-28 MED ORDER — FENTANYL CITRATE PF 50 MCG/ML IJ SOSY: 50.0000 ug | PREFILLED_SYRINGE | Freq: Once | INTRAMUSCULAR | Status: AC

## 2020-11-28 MED ORDER — METOCLOPRAMIDE HCL 5 MG/ML IJ SOLN: 5.0000 mg | Freq: Three times a day (TID) | INTRAMUSCULAR | Status: DC | PRN

## 2020-11-28 MED ORDER — MIDAZOLAM HCL 2 MG/2ML IJ SOLN
INTRAMUSCULAR | Status: AC
  Administered 2020-11-28: 2 mg via INTRAVENOUS
  Filled 2020-11-28: qty 2

## 2020-11-28 MED ORDER — KETOROLAC TROMETHAMINE 15 MG/ML IJ SOLN
15.0000 mg | Freq: Four times a day (QID) | INTRAMUSCULAR | Status: AC
  Administered 2020-11-28 – 2020-11-29 (×4): 15 mg via INTRAVENOUS
  Filled 2020-11-28 (×3): qty 1

## 2020-11-28 MED ORDER — POVIDONE-IODINE 10 % EX SWAB
2.0000 "application " | Freq: Once | CUTANEOUS | Status: AC
  Administered 2020-11-28: 2 via TOPICAL

## 2020-11-28 MED ORDER — ACETAMINOPHEN 160 MG/5ML PO SOLN: 325.0000 mg | Freq: Once | ORAL | Status: AC | PRN

## 2020-11-28 MED ORDER — ASPIRIN 81 MG PO CHEW
81.0000 mg | CHEWABLE_TABLET | Freq: Two times a day (BID) | ORAL | Status: DC
  Administered 2020-11-29: 81 mg via ORAL
  Filled 2020-11-28 (×2): qty 1

## 2020-11-28 MED ORDER — BUPIVACAINE-EPINEPHRINE (PF) 0.25% -1:200000 IJ SOLN
INTRAMUSCULAR | Status: AC
  Filled 2020-11-28: qty 30

## 2020-11-28 MED ORDER — SODIUM CHLORIDE 0.9 % IR SOLN
Status: DC | PRN
  Administered 2020-11-28: 1000 mL

## 2020-11-28 MED ORDER — ROPINIROLE HCL 0.5 MG PO TABS
0.5000 mg | ORAL_TABLET | Freq: Every day | ORAL | Status: DC
  Administered 2020-11-28: 0.5 mg via ORAL
  Filled 2020-11-28: qty 1

## 2020-11-28 SURGICAL SUPPLY — 52 items
BAG COUNTER SPONGE SURGICOUNT (BAG) ×2 IMPLANT
BAG DECANTER FOR FLEXI CONT (MISCELLANEOUS) ×2 IMPLANT
BAG ZIPLOCK 12X15 (MISCELLANEOUS) ×2 IMPLANT
BLADE SAGITTAL 25.0X1.19X90 (BLADE) ×2 IMPLANT
BLADE SAW SGTL 11.0X1.19X90.0M (BLADE) ×2 IMPLANT
BLADE SURG SZ10 CARB STEEL (BLADE) ×2 IMPLANT
BNDG ELASTIC 6X10 VLCR STRL LF (GAUZE/BANDAGES/DRESSINGS) ×2 IMPLANT
BNDG ELASTIC 6X5.8 VLCR STR LF (GAUZE/BANDAGES/DRESSINGS) ×2 IMPLANT
BOOTIES KNEE HIGH SLOAN (MISCELLANEOUS) ×2 IMPLANT
BOWL SMART MIX CTS (DISPOSABLE) ×2 IMPLANT
BUR SURG PFJ MILL NEXGEN (BURR) ×1 IMPLANT
BURR SURG PFJ MILL NEXGEN (BURR) ×2
CEMENT HV SMART SET (Cement) ×2 IMPLANT
COMP FEM NEXGEN SZ4 +3.5 LT (Knees) ×2 IMPLANT
COMPONENT FEM NEXGN SZ4 +3.5LT (Knees) ×1 IMPLANT
COVER SURGICAL LIGHT HANDLE (MISCELLANEOUS) ×2 IMPLANT
CUFF TOURN SGL QUICK 34 (TOURNIQUET CUFF) ×2
CUFF TRNQT CYL 34X4.125X (TOURNIQUET CUFF) ×1 IMPLANT
DECANTER SPIKE VIAL GLASS SM (MISCELLANEOUS) ×4 IMPLANT
DRAPE INCISE IOBAN 66X45 STRL (DRAPES) ×6 IMPLANT
DRAPE SHEET LG 3/4 BI-LAMINATE (DRAPES) ×2 IMPLANT
DRAPE U-SHAPE 47X51 STRL (DRAPES) ×2 IMPLANT
DRSG AQUACEL AG ADV 3.5X10 (GAUZE/BANDAGES/DRESSINGS) ×2 IMPLANT
DURAPREP 26ML APPLICATOR (WOUND CARE) ×4 IMPLANT
ELECT REM PT RETURN 15FT ADLT (MISCELLANEOUS) ×2 IMPLANT
GLOVE SRG 8 PF TXTR STRL LF DI (GLOVE) ×2 IMPLANT
GLOVE SURG ENC MOIS LTX SZ8 (GLOVE) ×4 IMPLANT
GLOVE SURG UNDER POLY LF SZ8 (GLOVE) ×4
GOWN STRL REUS W/TWL XL LVL3 (GOWN DISPOSABLE) ×4 IMPLANT
HANDPIECE INTERPULSE COAX TIP (DISPOSABLE) ×2
HOLDER FOLEY CATH W/STRAP (MISCELLANEOUS) IMPLANT
HOOD PEEL AWAY FLYTE STAYCOOL (MISCELLANEOUS) ×6 IMPLANT
KIT TURNOVER KIT A (KITS) ×2 IMPLANT
MANIFOLD NEPTUNE II (INSTRUMENTS) ×2 IMPLANT
NEEDLE HYPO 22GX1.5 SAFETY (NEEDLE) ×2 IMPLANT
NS IRRIG 1000ML POUR BTL (IV SOLUTION) ×2 IMPLANT
PACK TOTAL KNEE CUSTOM (KITS) ×2 IMPLANT
PAD ARMBOARD 7.5X6 YLW CONV (MISCELLANEOUS) ×2 IMPLANT
PROTECTOR NERVE ULNAR (MISCELLANEOUS) ×2 IMPLANT
SCREW HEADED 33MM KNEE (MISCELLANEOUS) ×6 IMPLANT
SET HNDPC FAN SPRY TIP SCT (DISPOSABLE) ×1 IMPLANT
STEM POLY PAT PLY 38M KNEE (Knees) ×2 IMPLANT
SUT ETHIBOND NAB CT1 #1 30IN (SUTURE) ×4 IMPLANT
SUT VIC AB 0 CT1 36 (SUTURE) ×2 IMPLANT
SUT VIC AB 2-0 CT1 27 (SUTURE) ×2
SUT VIC AB 2-0 CT1 TAPERPNT 27 (SUTURE) ×1 IMPLANT
SUT VICRYL AB 3-0 FS1 BRD 27IN (SUTURE) ×2 IMPLANT
SUT VLOC 180 0 24IN GS25 (SUTURE) ×2 IMPLANT
TRAY FOLEY MTR SLVR 16FR STAT (SET/KITS/TRAYS/PACK) IMPLANT
WATER STERILE IRR 1000ML POUR (IV SOLUTION) ×2 IMPLANT
WRAP KNEE MAXI GEL POST OP (GAUZE/BANDAGES/DRESSINGS) ×2 IMPLANT
YANKAUER SUCT BULB TIP NO VENT (SUCTIONS) ×2 IMPLANT

## 2020-11-28 NOTE — Anesthesia Procedure Notes (Signed)
Spinal  Start time: 11/28/2020 9:55 AM End time: 11/28/2020 9:57 AM Reason for block: surgical anesthesia Staffing Performed: anesthesiologist  Anesthesiologist: Effie Berkshire, MD Preanesthetic Checklist Completed: patient identified, IV checked, site marked, risks and benefits discussed, surgical consent, monitors and equipment checked, pre-op evaluation and timeout performed Spinal Block Patient position: sitting Prep: DuraPrep and site prepped and draped Location: L3-4 Injection technique: single-shot Needle Needle type: Pencan  Needle gauge: 24 G Needle length: 10 cm Needle insertion depth: 10 cm Additional Notes Patient tolerated well. No immediate complications.

## 2020-11-28 NOTE — Plan of Care (Signed)
  Problem: Education: Goal: Knowledge of General Education information will improve Description: Including pain rating scale, medication(s)/side effects and non-pharmacologic comfort measures Outcome: Progressing   Problem: Activity: Goal: Risk for activity intolerance will decrease Outcome: Progressing   Problem: Elimination: Goal: Will not experience complications related to bowel motility Outcome: Progressing   

## 2020-11-28 NOTE — Transfer of Care (Signed)
Immediate Anesthesia Transfer of Care Note  Patient: Steven Vincent  Procedure(s) Performed: Procedure(s): LEFT KNEE PATELLAFEMORL REPLACEMENT (Left)  Patient Location: PACU  Anesthesia Type:Spinal  Level of Consciousness:  sedated, patient cooperative and responds to stimulation  Airway & Oxygen Therapy:Patient Spontanous Breathing and Patient connected to face mask oxgen  Post-op Assessment:  Report given to PACU RN and Post -op Vital signs reviewed and stable  Post vital signs:  Reviewed and stable  Last Vitals:  Vitals:   11/28/20 0830 11/28/20 0835  BP: (!) 141/94 120/77  Pulse: 64 64  Resp: 16 11  Temp:    SpO2: 53% 91%    Complications: No apparent anesthesia complications

## 2020-11-28 NOTE — Plan of Care (Signed)

## 2020-11-28 NOTE — Evaluation (Signed)
Physical Therapy Evaluation Patient Details Name: Steven Vincent MRN: [id number] DOB: September 03, 1965 Today's Date: 11/28/2020  History of Present Illness  Pt is 54 yo male admitted s/p L knee patellafemoral replacement on 11/28/20.  He has hx of srthritis, OSA, personality disorder, and seizures  Clinical Impression  Pt is s/p L patellafemoral replacement resulting in the deficits listed below (see PT Problem List). At baseline, pt lives alone and is very independent.  Reports he "goes, goes, goes, not used to sitting still."  Today, pt reported increased pain but no overt signs of pain.  He transferred with min guard and ambulated 200' well with min cues.  Expected to progress well. Pt will benefit from skilled PT to increase their independence and safety with mobility to allow discharge to the venue listed below.         Recommendations for follow up therapy are one component of a multi-disciplinary discharge planning process, led by the attending physician.  Recommendations may be updated based on patient status, additional functional criteria and insurance authorization.  Follow Up Recommendations Follow surgeon's recommendation for DC plan and follow-up therapies;Supervision for mobility/OOB    Equipment Recommendations  Rolling walker with 5" wheels    Recommendations for Other Services       Precautions / Restrictions Precautions Precautions: None Restrictions Other Position/Activity Restrictions: WBAT      Mobility  Bed Mobility Overal bed mobility: Needs Assistance Bed Mobility: Supine to Sit     Supine to sit: Supervision          Transfers Overall transfer level: Needs assistance Equipment used: Rolling walker (2 wheeled) Transfers: Sit to/from Stand Sit to Stand: From elevated surface;Min guard         General transfer comment: cues for hand placement and L LE management  Ambulation/Gait Ambulation/Gait assistance: Min guard Gait Distance (Feet): 200  Feet Assistive device: Rolling walker (2 wheeled) Gait Pattern/deviations: Step-through pattern Gait velocity: decreased   General Gait Details: Min cues for posture and RW use  Stairs            Wheelchair Mobility    Modified Rankin (Stroke Patients Only)       Balance Overall balance assessment: Needs assistance Sitting-balance support: No upper extremity supported Sitting balance-Leahy Scale: Good     Standing balance support: No upper extremity supported;Bilateral upper extremity supported Standing balance-Leahy Scale: Fair Standing balance comment: RW to ambulate; no UE to static stand                             Pertinent Vitals/Pain Pain Assessment: 0-10 Pain Score: 6  Pain Location: L knee Pain Descriptors / Indicators: Discomfort;Sore Pain Intervention(s): Limited activity within patient's tolerance;Monitored during session;Repositioned;Ice applied    Home Living Family/patient expects to be discharged to:: Private residence Living Arrangements: Alone Available Help at Discharge: Friend(s);Available 24 hours/day (church members to assist) Type of Home: Apartment (studio apt) Home Access: Stairs to enter Entrance Stairs-Rails: Chemical engineer of Steps: 2 Home Layout: One level Home Equipment: Grab bars - toilet;Cane - single point Additional Comments: shower to small for seat    Prior Function Level of Independence: Independent         Comments: Pt very independent ; works at [employer]        Extremity/Trunk Assessment   Upper Extremity Assessment Upper Extremity Assessment: Overall WFL for tasks assessed    Lower Extremity  Assessment Lower Extremity Assessment: LLE deficits/detail LLE Deficits / Details: Expected post op changes; ROM : knee 0 to 90 degrees; MMT: ankle 5/5, knee 3/5, hip 3/5    Cervical / Trunk Assessment Cervical / Trunk Assessment: Normal  Communication    Communication: No difficulties  Cognition Arousal/Alertness: Awake/alert Behavior During Therapy: WFL for tasks assessed/performed Overall Cognitive Status: Within Functional Limits for tasks assessed                                        General Comments      Exercises Total Joint Exercises Ankle Circles/Pumps: AROM;5 reps;Both;Seated Quad Sets: AROM;Seated;Both;5 reps   Assessment/Plan    PT Assessment Patient needs continued PT services  PT Problem List Decreased strength;Decreased mobility;Decreased safety awareness;Decreased range of motion;Decreased activity tolerance;Decreased balance;Decreased knowledge of use of DME;Pain       PT Treatment Interventions DME instruction;Therapeutic activities;Gait training;Therapeutic exercise;Patient/family education;Modalities;Stair training;Balance training;Functional mobility training    PT Goals (Current goals can be found in the Care Plan section)  Acute Rehab PT Goals Patient Stated Goal: return home PT Goal Formulation: With patient Time For Goal Achievement: 12/12/20 Potential to Achieve Goals: Good    Frequency 7X/week   Barriers to discharge        Co-evaluation               AM-PAC PT "6 Clicks" Mobility  Outcome Measure Help needed turning from your back to your side while in a flat bed without using bedrails?: A Little Help needed moving from lying on your back to sitting on the side of a flat bed without using bedrails?: A Little Help needed moving to and from a bed to a chair (including a wheelchair)?: A Little Help needed standing up from a chair using your arms (e.g., wheelchair or bedside chair)?: A Little Help needed to walk in hospital room?: A Little Help needed climbing 3-5 steps with a railing? : A Little 6 Click Score: 18    End of Session Equipment Utilized During Treatment: Gait belt Activity Tolerance: Patient tolerated treatment well Patient left: with call bell/phone  within reach;with chair alarm set;in chair Nurse Communication: Mobility status PT Visit Diagnosis: Other abnormalities of gait and mobility (R26.89);Muscle weakness (generalized) (M62.81)    Time: 9373-4287 PT Time Calculation (min) (ACUTE ONLY): 22 min   Charges:   PT Evaluation $PT Eval Low Complexity: 1 Low          Beautiful [place], PT Acute Rehab Services Pager [phone] [facility] [phone]   Karlton Lemon 11/28/2020, 5:18 PM

## 2020-11-28 NOTE — Op Note (Signed)
PREOP DIAGNOSIS: DJD LEFT KNEE patellofemoral POSTOP DIAGNOSIS:  same PROCEDURE: LEFT patellofemoral replacement ANESTHESIA: Spinal and MAC ATTENDING SURGEON: Hessie Dibble ASSISTANT: Steven Dolly PA  INDICATIONS FOR PROCEDURE: Steven Vincent is a 55 y.o. male who has struggled for a long time with pain due to degenerative arthritis of the left knee.  The patient has failed many conservative non-operative measures and at this point has pain which limits the ability to sleep and walk.  The patient is offered partial knee replacement.  Informed operative consent was obtained after discussion of possible risks of anesthesia, infection, neurovascular injury, DVT, and death.  The importance of the post-operative rehabilitation protocol to optimize result was stressed extensively with the patient.  SUMMARY OF FINDINGS AND PROCEDURE:  Steven Vincent was taken to the operative suite where under the above anesthesia a left knee patellofemoral replacement was performed.  There were advanced degenerative changes and the bone quality was excellent.  We used the [employer] system and placed size 4 left Gender [employer] trochlear component and a 38 polyethylene patella.  Steven Dolly PA-C assisted throughout and was invaluable to the completion of the case in that he helped retract and maintain exposure while I placed the components.  He also helped close thereby minimizing OR time.  The patient was admitted for appropriate post-op care to include perioperative antibiotics and mechanical and pharmacologic measures for DVT prophylaxis.  DESCRIPTION OF PROCEDURE:  Steven Vincent was taken to the operative suite where the above anesthesia was applied.  The patient was positioned supine and prepped and draped in normal sterile fashion.  An appropriate time out was performed.  After the administration of kefzol pre-op antibiotic the leg was elevated and exsanguinated and a tourniquet inflated.  A standard  longitudinal incision was made on the anterior knee.  Dissection was carried down to the extensor mechanism.  All appropriate anti-infective measures were used including the pre-operative antibiotic, betadine impregnated drape, and closed hooded exhaust systems for each member of the surgical team.  A medial parapatellar incision was made in the extensor mechanism and the knee cap flipped and the knee flexed.  An intramedullary guide was placed in the femur and was utilized to make an anterior cut flush with the anterior cortex of the femur. A second guide was placed an a dremel device used to create the appropriate footprint for the trochlear component.  This sized to a 4. The patella sized to a 38 and was cut to a thickness of 46m.  The patellar guide was used to create three drill holes. A trial reduction was done and the knee easily came to full extension and the patella tracked well on flexion.  The trial components were removed and all bones were cleaned with pulsatile lavage and then dried thoroughly.  Cement was mixed and was pressurized onto the bones followed by placement of the aforementioned components.  Excess cement was trimmed and pressure was held on the components until the cement had hardened.  The tourniquet was deflated and a small amount of bleeding was controlled with cautery and pressure.  The knee was irrigated thoroughly.  The extensor mechanism was re-approximated with #1 ethibond in interrupted fashion.  The knee was flexed and the repair was solid.  The subcutaneous tissues were re-approximated with #0 and #2-0 vicryl and the skin closed with a subcuticular stitch and steristrips.  A sterile dressing was applied.  Intraoperative fluids, EBL, and tourniquet time can be obtained from anesthesia records.  DISPOSITION:  The patient was taken to recovery room in stable condition and admitted for appropriate post-op care to include peri-operative antibiotic and DVT prophylaxis with  mechanical and pharmacologic measures.  Hessie Dibble 11/28/2020, 11:34 AM

## 2020-11-28 NOTE — Interval H&P Note (Signed)
History and Physical Interval Note:  11/28/2020 8:58 AM  Steven Vincent  has presented today for surgery, with the diagnosis of LEFT KNEE PATELLAFEMORAL DEGENERATIVE JOINT DISEASE.  The various methods of treatment have been discussed with the patient and family. After consideration of risks, benefits and other options for treatment, the patient has consented to  Procedure(s): LEFT KNEE PATELLAFEMORL REPLACEMENT (Left) as a surgical intervention.  The patient's history has been reviewed, patient examined, no change in status, stable for surgery.  I have reviewed the patient's chart and labs.  Questions were answered to the patient's satisfaction.     Hessie Dibble

## 2020-11-28 NOTE — Progress Notes (Signed)
Assisted Dr. Hollis with left, ultrasound guided, adductor canal block. Side rails up, monitors on throughout procedure. See vital signs in flow sheet. Tolerated Procedure well.  

## 2020-11-28 NOTE — Anesthesia Postprocedure Evaluation (Signed)
Anesthesia Post Note  Patient: Steven Vincent  Procedure(s) Performed: LEFT KNEE PATELLAFEMORL REPLACEMENT (Left: Knee)     Patient location during evaluation: PACU Anesthesia Type: Spinal Level of consciousness: oriented and awake and alert Pain management: pain level controlled Vital Signs Assessment: post-procedure vital signs reviewed and stable Respiratory status: spontaneous breathing, respiratory function stable and patient connected to nasal cannula oxygen Cardiovascular status: blood pressure returned to baseline and stable Postop Assessment: no headache, no backache and no apparent nausea or vomiting Anesthetic complications: no   No notable events documented.  Last Vitals:  Vitals:   11/28/20 1330 11/28/20 1400  BP: (!) 129/97 (!) 134/96  Pulse: 60 65  Resp: (!) 9 12  Temp: 36.4 C   SpO2: 100% 100%    Last Pain:  Vitals:   11/28/20 1300  TempSrc:   PainSc: 0-No pain                 Effie Berkshire

## 2020-11-28 NOTE — Anesthesia Procedure Notes (Signed)
Anesthesia Regional Block: Adductor canal block   Pre-Anesthetic Checklist: , timeout performed,  Correct Patient, Correct Site, Correct Laterality,  Correct Procedure, Correct Position, site marked,  Risks and benefits discussed,  Surgical consent,  Pre-op evaluation,  At surgeon's request and post-op pain management  Laterality: Left  Prep: chloraprep       Needles:  Injection technique: Single-shot  Needle Type: Echogenic Stimulator Needle     Needle Length: 9cm  Needle Gauge: 21     Additional Needles:   Procedures:,,,, ultrasound used (permanent image in chart),,    Narrative:  Start time: 11/28/2020 8:35 AM End time: 11/28/2020 8:40 AM Injection made incrementally with aspirations every 5 mL.  Performed by: Personally  Anesthesiologist: Effie Berkshire, MD  Additional Notes: Patient tolerated the procedure well. Local anesthetic introduced in an incremental fashion under minimal resistance after negative aspirations. No paresthesias were elicited. After completion of the procedure, no acute issues were identified and patient continued to be monitored by RN.

## 2020-11-29 ENCOUNTER — Other Ambulatory Visit (HOSPITAL_COMMUNITY): Payer: Self-pay

## 2020-11-29 ENCOUNTER — Telehealth: Payer: Self-pay | Admitting: Pulmonary Disease

## 2020-11-29 ENCOUNTER — Encounter (HOSPITAL_COMMUNITY): Payer: Self-pay | Admitting: Orthopaedic Surgery

## 2020-11-29 DIAGNOSIS — Z87891 Personal history of nicotine dependence: Secondary | ICD-10-CM | POA: Diagnosis not present

## 2020-11-29 DIAGNOSIS — Z85828 Personal history of other malignant neoplasm of skin: Secondary | ICD-10-CM | POA: Diagnosis not present

## 2020-11-29 DIAGNOSIS — M1712 Unilateral primary osteoarthritis, left knee: Secondary | ICD-10-CM | POA: Diagnosis not present

## 2020-11-29 MED ORDER — TRAMADOL HCL 50 MG PO TABS
50.0000 mg | ORAL_TABLET | Freq: Four times a day (QID) | ORAL | 0 refills | Status: DC | PRN
  Filled 2020-11-29: qty 30, 8d supply, fill #0

## 2020-11-29 MED ORDER — ASPIRIN EC 81 MG PO TBEC
81.0000 mg | DELAYED_RELEASE_TABLET | Freq: Two times a day (BID) | ORAL | 0 refills | Status: DC
  Filled 2020-11-29: qty 45, 23d supply, fill #0

## 2020-11-29 MED ORDER — TIZANIDINE HCL 2 MG PO TABS
2.0000 mg | ORAL_TABLET | Freq: Four times a day (QID) | ORAL | 1 refills | Status: AC | PRN
  Filled 2020-11-29: qty 30, 8d supply, fill #0

## 2020-11-29 NOTE — Telephone Encounter (Signed)
Liyah from [facility] and [place] state patient's insurance showed no out of network benefits. Patient denied paying the $2,800.00. [place] phone number is [phone].

## 2020-11-29 NOTE — Progress Notes (Signed)
Subjective: 1 Day Post-Op Procedure(s) (LRB): LEFT KNEE PATELLAFEMORL REPLACEMENT (Left)  Patient doing very well. No real pain. Looking forward to going home today.  Activity level:  wbat Diet tolerance:  ok Voiding:  ok Patient reports pain as mild.    Objective: Vital signs in last 24 hours: Temp:  [97.5 F (36.4 C)-99.1 F (37.3 C)] 98 F (36.7 C) (10/05 0542) Pulse Rate:  [59-78] 62 (10/05 0542) Resp:  [7-20] 16 (10/05 0542) BP: (118-141)/(77-100) 133/81 (10/05 0542) SpO2:  [98 %-100 %] 100 % (10/05 0542) Weight:  [123.5 kg] 123.5 kg (10/04 1538)  Labs: No results for input(s): HGB in the last 72 hours. No results for input(s): WBC, RBC, HCT, PLT in the last 72 hours. No results for input(s): NA, K, CL, CO2, BUN, CREATININE, GLUCOSE, CALCIUM in the last 72 hours. No results for input(s): LABPT, INR in the last 72 hours.  Physical Exam:  Neurologically intact ABD soft Neurovascular intact Sensation intact distally Intact pulses distally Dorsiflexion/Plantar flexion intact Incision: dressing C/D/I and no drainage No cellulitis present Compartment soft  Assessment/Plan:  1 Day Post-Op Procedure(s) (LRB): LEFT KNEE PATELLAFEMORL REPLACEMENT (Left) Advance diet Up with therapy Discharge home with home health today after PT. Follow up in office 2 weeks post op. Continue on 81mg  EC asa BID for dvt prevention.   Larwance Sachs Hesston Hitchens 11/29/2020, 8:07 AM

## 2020-11-29 NOTE — Telephone Encounter (Signed)
Call returned to patient, confirmed DOB. Patient is all over the place. He is not able to give me a reason as to why he called. Initially he states the dentist does not accept his insurance then says they told him he is not a candidate for a oral device. The patient is wanting to know what is going on. I asked if he contacted his dentist office since they are the ones doing the evaluation. He states no. I asked why he has not contacted them and he stated because he wanted [place] to do it.   Call made to [facility]. They are going to have their manager look into this and call us back. Unfortunately they are not able to tell whether or not this has been addressed at his OV or not. Will update [place] when they call back.   When they call back if he has not been evaluated can they go ahead and get him scheduled to be evaluated for the oral device.

## 2020-11-29 NOTE — Progress Notes (Signed)
Physical Therapy Treatment Patient Details Name: Steven Vincent MRN: [id number] DOB: 05/06/1965 Today's Date: 11/29/2020   History of Present Illness Pt is 55 yo male admitted s/p L knee patellafemoral replacement on 11/28/20.  He has hx of srthritis, OSA, personality disorder, and seizures    PT Comments    Pt is POD # 1 and is progressing well.  He ambulated 200'x2 with supervision and RW and performed stair setup similar to d/c destination.  Pt demonstrates safe gait & transfers in order to return home from PT perspective once discharged by MD.  While in hospital, will continue to benefit from PT for skilled therapy to advance mobility and exercises.      Recommendations for follow up therapy are one component of a multi-disciplinary discharge planning process, led by the attending physician.  Recommendations may be updated based on patient status, additional functional criteria and insurance authorization.  Follow Up Recommendations  Follow surgeon's recommendation for DC plan and follow-up therapies;Supervision for mobility/OOB     Equipment Recommendations  Rolling walker with 5" wheels    Recommendations for Other Services       Precautions / Restrictions Precautions Precautions: None Restrictions Weight Bearing Restrictions: No Other Position/Activity Restrictions: WBAT     Mobility  Bed Mobility Overal bed mobility: Needs Assistance Bed Mobility: Supine to Sit     Supine to sit: Supervision          Transfers Overall transfer level: Needs assistance Equipment used: Rolling walker (2 wheeled) Transfers: Sit to/from Stand Sit to Stand: Supervision         General transfer comment: cues for L LE management; performed x 3  Ambulation/Gait Ambulation/Gait assistance: Min guard Gait Distance (Feet): 200 Feet (200'x2) Assistive device: Rolling walker (2 wheeled) Gait Pattern/deviations: Step-through pattern Gait velocity: decreased   General Gait  Details: Min cues for posture and RW use   Stairs Stairs: Yes Stairs assistance: Min guard Stair Management: One rail Right;Two rails;With cane;Step to pattern;Forwards Number of Stairs: 6 General stair comments: Started with 2 rails progressing to 1 rail on R and then to 3 steps with cane.  Min guard and cues for sequencing initially.  Performed safely.  Pt staying with friend who had 2 x2 steps with no rails and 2 steps with R rail   Wheelchair Mobility    Modified Rankin (Stroke Patients Only)       Balance Overall balance assessment: Needs assistance Sitting-balance support: No upper extremity supported Sitting balance-Leahy Scale: Good     Standing balance support: No upper extremity supported;Bilateral upper extremity supported Standing balance-Leahy Scale: Fair Standing balance comment: RW to ambulate; no UE to static stand; stairs with single support                            Cognition Arousal/Alertness: Awake/alert Behavior During Therapy: WFL for tasks assessed/performed Overall Cognitive Status: Within Functional Limits for tasks assessed                                        Exercises Total Joint Exercises Ankle Circles/Pumps: AROM;Both;10 reps;Supine Quad Sets: AROM;Both;10 reps;Supine Towel Squeeze: AROM;Both;10 reps;Supine Heel Slides: AROM;Left;10 reps;Supine Hip ABduction/ADduction: AROM;Left;10 reps;Supine Long Arc Quad: AROM;Left;10 reps;Seated Knee Flexion: AROM;Left;10 reps;Seated Goniometric ROM: L knee 0 to 85 degrees Other Exercises Other Exercises: Cues for exercise form and slow controlled motions  General Comments  Educated on safe ice use, no pivots, car transfers, resting with leg straight, safety (supervision with mobiltiy/stairs). Also, encouraged walking every 1-2 hours during day. Educated on HEP with focus on mobility the first weeks. Discussed doing exercises within pain control and if pain increasing  could decreased ROM, reps, and stop exercises as needed. Encouraged to perform quad sets and ankle pumps frequently for blood flow and to promote full knee extension.       Pertinent Vitals/Pain Pain Assessment: 0-10 Pain Score: 3  Pain Location: L knee Pain Descriptors / Indicators: Discomfort;Sore Pain Intervention(s): Limited activity within patient's tolerance;Monitored during session;Ice applied;Premedicated before session    Home Living                      Prior Function            PT Goals (current goals can now be found in the care plan section) Progress towards PT goals: Progressing toward goals    Frequency    7X/week      PT Plan Current plan remains appropriate    Co-evaluation              AM-PAC PT "6 Clicks" Mobility   Outcome Measure  Help needed turning from your back to your side while in a flat bed without using bedrails?: None Help needed moving from lying on your back to sitting on the side of a flat bed without using bedrails?: None Help needed moving to and from a bed to a chair (including a wheelchair)?: A Little Help needed standing up from a chair using your arms (e.g., wheelchair or bedside chair)?: A Little Help needed to walk in hospital room?: A Little Help needed climbing 3-5 steps with a railing? : A Little 6 Click Score: 20    End of Session Equipment Utilized During Treatment: Gait belt Activity Tolerance: Patient tolerated treatment well Patient left: with call bell/phone within reach;with chair alarm set;in chair Nurse Communication: Mobility status PT Visit Diagnosis: Other abnormalities of gait and mobility (R26.89);Muscle weakness (generalized) (M62.81)     Time: 5329-9242 PT Time Calculation (min) (ACUTE ONLY): 29 min  Charges:  $Gait Training: 8-22 mins $Therapeutic Exercise: 8-22 mins                     Abran Richard, PT Acute Rehab Services Pager [phone] [facility] 11/29/2020, 11:26 AM

## 2020-11-29 NOTE — Discharge Summary (Signed)
Patient ID: Steven Vincent MRN: [id number] DOB/AGE: 07-13-1965 55 y.o.  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 male who was admitted 11/28/2020 for operative treatment of<principal problem not specified>. Patient has severe unremitting pain that affects sleep, daily activities, and work/hobbies. After pre-op clearance the patient was taken to the operating room on 11/28/2020 and underwent  Procedure(s): LEFT KNEE PATELLAFEMORL REPLACEMENT.    Patient was given perioperative antibiotics:  Anti-infectives (From admission, onward)    Start     Dose/Rate Route Frequency Ordered Stop   11/28/20 1630  ceFAZolin (ANCEF) IVPB 2g/100 mL premix        2 g 200 mL/hr over 30 Minutes Intravenous Every 6 hours 11/28/20 1540 11/29/20 0755   11/28/20 0745  ceFAZolin (ANCEF) IVPB 3g/100 mL premix        3 g 200 mL/hr over 30 Minutes Intravenous On call to O.R. 11/28/20 4098 11/28/20 0957        Patient was given sequential compression devices, early ambulation, and chemoprophylaxis to prevent DVT.  Patient benefited maximally from hospital stay and there were no complications.    Recent vital signs: Patient Vitals for the past 24 hrs:  BP Temp Temp src Pulse Resp SpO2 Height Weight  11/29/20 0542 133/81 98 F (36.7 C) -- 62 16 100 % -- --  11/29/20 0126 136/77 97.7 F (36.5 C) Oral 69 16 99 % -- --   11/28/20 2050 124/86 99.1 F (37.3 C) Oral 76 16 98 % -- --  11/28/20 1735 (!) 138/95 98 F (36.7 C) Oral 68 18 98 % -- --  11/28/20 1538 134/88 97.7 F (36.5 C) Oral 67 20 100 % 6\' 2"  (1.88 m) 123.5 kg  11/28/20 1515 (!) 127/95 -- -- 72 15 99 % -- --  11/28/20 1500 (!) 127/95 97.6 F (36.4 C) -- 67 13 99 % -- --  11/28/20 1445 (!) 132/93 -- -- 66 12 100 % -- --  11/28/20 1430 (!) 139/94 -- -- 64 11 100 % -- --  11/28/20 1415 (!) 138/100 -- -- 71 13 100 % -- --  11/28/20 1400 (!) 134/96 -- -- 65 12 100 % -- --  11/28/20 1345 (!) 129/98 -- -- 62 13 100 % -- --  11/28/20 1330 (!) 129/97 97.6 F (36.4 C) -- 60 (!) 9 100 % -- --  11/28/20 1315 (!) 125/98 -- -- 61 13 100 % -- --  11/28/20 1300 118/82 -- -- 63 (!) 8 100 % -- --  11/28/20 1240 -- (!) 97.5 F (36.4 C) -- 62 15 100 % -- --  11/28/20 1230 -- -- -- (!) 59 (!) 8 100 % -- --  11/28/20 1220 -- -- -- 65 (!) 8 100 % -- --  11/28/20 1210 -- -- -- 66 (!) 7  100 % -- --  11/28/20 1200 (!) 141/98 -- -- 71 (!) 9 100 % -- --  11/28/20 1151 130/86 97.8 F (36.6 C) -- 78 (!) 8 100 % -- --  11/28/20 0835 120/77 -- -- 64 11 98 % -- --  11/28/20 0830 (!) 141/94 -- -- 64 16 99 % -- --     Recent laboratory studies: No results for input(s): WBC, HGB, HCT, PLT, NA, K, CL, CO2, BUN, CREATININE, GLUCOSE, INR, CALCIUM in the last 72 hours.  Invalid input(s): PT, 2   Discharge Medications:   Allergies as of 11/29/2020       Reactions   Delsym [dextromethorphan] Other (See Comments)   Gets loopy/memory loss   Aspirin    GI upset Other reaction(s): Vomiting   Mucinex D [pseudoephedrine-guaifenesin Er] Hypertension   Ibuprofen Other (See Comments)   Stomach irritation        Medication List     STOP taking these medications    meloxicam 15 MG tablet Commonly known as: MOBIC       TAKE these medications    Aimovig 140 MG/ML Soaj Generic drug: Erenumab-aooe Inject 140 mg into the skin every 30 (thirty) days.   aspirin EC  81 MG tablet Take 1 tablet (81 mg total) by mouth 2 (two) times daily after a meal. Swallow whole.   diclofenac 1.3 % Ptch Commonly known as: FLECTOR Place 1 patch onto the skin daily as needed for pain.   Multaq 400 MG tablet Generic drug: dronedarone TAKE 1 TABLET TWICE DAILY WITH MEALS   pravastatin 20 MG tablet Commonly known as: PRAVACHOL Take 1 tablet (20 mg total) by mouth daily.   rOPINIRole 0.5 MG tablet Commonly known as: Requip Take 1 tablet (0.5 mg total) by mouth at bedtime.   SUMAtriptan 6 MG/0.5ML Soaj INJECT 6 MG INTO THE SKIN DAILY AS NEEDED AT MIGRAINE ONSET. MAY REPEAT 1 TIME AFTER 1 HOUR IF NEEDED   tiZANidine 2 MG tablet Commonly known as: ZANAFLEX Take 1 tablet (2 mg total) by mouth every 6 (six) hours as needed for muscle spasms.   topiramate 50 MG tablet Commonly known as: TOPAMAX Take one tablet in the AM and two tablets in the PM.   traMADol 50 MG tablet Commonly known as: Ultram Take 1 tablet (50 mg total) by mouth every 6 (six) hours as needed for moderate pain or severe pain (post op pain).               Durable Medical Equipment  (From admission, onward)           Start     Ordered   11/28/20 1541  DME Walker rolling  Once       Question:  Patient needs a walker to treat with the following condition  Answer:  Primary osteoarthritis of left knee   11/28/20 1540   11/28/20 1541  DME 3 n 1  Once        11/28/20 1540   11/28/20 1541  DME Bedside commode  Once       Question:  Patient needs a bedside commode to treat with the following condition  Answer:  Primary osteoarthritis of left knee   11/28/20 1540            Diagnostic Studies: DG Chest 2 View  Result Date: 11/15/2020 CLINICAL DATA:  Preop for left knee replacement. EXAM: CHEST - 2 VIEW COMPARISON:  None. FINDINGS: The heart size and mediastinal contours are  within normal limits. Both lungs are clear. The visualized skeletal structures are unremarkable. IMPRESSION:  No active cardiopulmonary disease. Electronically Signed   By: Marijo Conception M.D.   On: 11/15/2020 16:06    Disposition: Discharge disposition: 01-Home or Self Care       Discharge Instructions     Call MD / Call 911   Complete by: As directed    If you experience chest pain or shortness of breath, CALL 911 and be transported to the hospital emergency room.  If you develope a fever above 101 F, pus (white drainage) or increased drainage or redness at the wound, or calf pain, call your surgeon's office.   Constipation Prevention   Complete by: As directed    Drink plenty of fluids.  Prune juice may be helpful.  You may use a stool softener, such as Colace (over the counter) 100 mg twice a day.  Use MiraLax (over the counter) for constipation as needed.   Diet - low sodium heart healthy   Complete by: As directed    Discharge instructions   Complete by: As directed    INSTRUCTIONS AFTER JOINT REPLACEMENT   Remove items at home which could result in a fall. This includes throw rugs or furniture in walking pathways ICE to the affected joint every three hours while awake for 30 minutes at a time, for at least the first 3-5 days, and then as needed for pain and swelling.  Continue to use ice for pain and swelling. You may notice swelling that will progress down to the foot and ankle.  This is normal after surgery.  Elevate your leg when you are not up walking on it.   Continue to use the breathing machine you got in the hospital (incentive spirometer) which will help keep your temperature down.  It is common for your temperature to cycle up and down following surgery, especially at night when you are not up moving around and exerting yourself.  The breathing machine keeps your lungs expanded and your temperature down.   DIET:  As you were doing prior to hospitalization, we recommend a well-balanced diet.  DRESSING / WOUND CARE / SHOWERING  You may shower 3 days after surgery, but keep the  wounds dry during showering.  You may use an occlusive plastic wrap (Press'n Seal for example), NO SOAKING/SUBMERGING IN THE BATHTUB.  If the bandage gets wet, change with a clean dry gauze.  If the incision gets wet, pat the wound dry with a clean towel.  ACTIVITY  Increase activity slowly as tolerated, but follow the weight bearing instructions below.   No driving for 6 weeks or until further direction given by your physician.  You cannot drive while taking narcotics.  No lifting or carrying greater than 10 lbs. until further directed by your surgeon. Avoid periods of inactivity such as sitting longer than an hour when not asleep. This helps prevent blood clots.  You may return to work once you are authorized by your doctor.     WEIGHT BEARING   Weight bearing as tolerated with assist device (walker, cane, etc) as directed, use it as long as suggested by your surgeon or therapist, typically at least 4-6 weeks.   EXERCISES  Results after joint replacement surgery are often greatly improved when you follow the exercise, range of motion and muscle strengthening exercises prescribed by your doctor. Safety measures are also important to protect the joint from further injury. Any time any of these  exercises cause you to have increased pain or swelling, decrease what you are doing until you are comfortable again and then slowly increase them. If you have problems or questions, call your caregiver or physical therapist for advice.   Rehabilitation is important following a joint replacement. After just a few days of immobilization, the muscles of the leg can become weakened and shrink (atrophy).  These exercises are designed to build up the tone and strength of the thigh and leg muscles and to improve motion. Often times heat used for twenty to thirty minutes before working out will loosen up your tissues and help with improving the range of motion but do not use heat for the first two weeks following  surgery (sometimes heat can increase post-operative swelling).   These exercises can be done on a training (exercise) mat, on the floor, on a table or on a bed. Use whatever works the best and is most comfortable for you.    Use music or television while you are exercising so that the exercises are a pleasant break in your day. This will make your life better with the exercises acting as a break in your routine that you can look forward to.   Perform all exercises about fifteen times, three times per day or as directed.  You should exercise both the operative leg and the other leg as well.  Exercises include:   Quad Sets - Tighten up the muscle on the front of the thigh (Quad) and hold for 5-10 seconds.   Straight Leg Raises - With your knee straight (if you were given a brace, keep it on), lift the leg to 60 degrees, hold for 3 seconds, and slowly lower the leg.  Perform this exercise against resistance later as your leg gets stronger.  Leg Slides: Lying on your back, slowly slide your foot toward your buttocks, bending your knee up off the floor (only go as far as is comfortable). Then slowly slide your foot back down until your leg is flat on the floor again.  Angel Wings: Lying on your back spread your legs to the side as far apart as you can without causing discomfort.  Hamstring Strength:  Lying on your back, push your heel against the floor with your leg straight by tightening up the muscles of your buttocks.  Repeat, but this time bend your knee to a comfortable angle, and push your heel against the floor.  You may put a pillow under the heel to make it more comfortable if necessary.   A rehabilitation program following joint replacement surgery can speed recovery and prevent re-injury in the future due to weakened muscles. Contact your doctor or a physical therapist for more information on knee rehabilitation.    CONSTIPATION  Constipation is defined medically as fewer than three stools per  week and severe constipation as less than one stool per week.  Even if you have a regular bowel pattern at home, your normal regimen is likely to be disrupted due to multiple reasons following surgery.  Combination of anesthesia, postoperative narcotics, change in appetite and fluid intake all can affect your bowels.   YOU MUST use at least one of the following options; they are listed in order of increasing strength to get the job done.  They are all available over the counter, and you may need to use some, POSSIBLY even all of these options:    Drink plenty of fluids (prune juice may be helpful) and high fiber foods  Colace 100 mg by mouth twice a day  Senokot for constipation as directed and as needed Dulcolax (bisacodyl), take with full glass of water  Miralax (polyethylene glycol) once or twice a day as needed.  If you have tried all these things and are unable to have a bowel movement in the first 3-4 days after surgery call either your surgeon or your primary doctor.    If you experience loose stools or diarrhea, hold the medications until you stool forms back up.  If your symptoms do not get better within 1 week or if they get worse, check with your doctor.  If you experience "the worst abdominal pain ever" or develop nausea or vomiting, please contact the office immediately for further recommendations for treatment.   ITCHING:  If you experience itching with your medications, try taking only a single pain pill, or even half a pain pill at a time.  You can also use Benadryl over the counter for itching or also to help with sleep.   TED HOSE STOCKINGS:  Use stockings on both legs until for at least 2 weeks or as directed by physician office. They may be removed at night for sleeping.  MEDICATIONS:  See your medication summary on the "After Visit Summary" that nursing will review with you.  You may have some home medications which will be placed on hold until you complete the course of blood  thinner medication.  It is important for you to complete the blood thinner medication as prescribed.  PRECAUTIONS:  If you experience chest pain or shortness of breath - call 911 immediately for transfer to the hospital emergency department.   If you develop a fever greater that 101 F, purulent drainage from wound, increased redness or drainage from wound, foul odor from the wound/dressing, or calf pain - CONTACT YOUR SURGEON.                                                   FOLLOW-UP APPOINTMENTS:  If you do not already have a post-op appointment, please call the office for an appointment to be seen by your surgeon.  Guidelines for how soon to be seen are listed in your "After Visit Summary", but are typically between 1-4 weeks after surgery.  OTHER INSTRUCTIONS:   Knee Replacement:  Do not place pillow under knee, focus on keeping the knee straight while resting. CPM instructions: 0-90 degrees, 2 hours in the morning, 2 hours in the afternoon, and 2 hours in the evening. Place foam block, curve side up under heel at all times except when in CPM or when walking.  DO NOT modify, tear, cut, or change the foam block in any way.  POST-OPERATIVE OPIOID TAPER INSTRUCTIONS: It is important to wean off of your opioid medication as soon as possible. If you do not need pain medication after your surgery it is ok to stop day one. Opioids include: Codeine, Hydrocodone(Norco, Vicodin), Oxycodone(Percocet, oxycontin) and hydromorphone amongst others.  Long term and even short term use of opiods can cause: Increased pain response Dependence Constipation Depression Respiratory depression And more.  Withdrawal symptoms can include Flu like symptoms Nausea, vomiting And more Techniques to manage these symptoms Hydrate well Eat regular healthy meals Stay active Use relaxation techniques(deep breathing, meditating, yoga) Do Not substitute Alcohol to help with tapering If you have been  on opioids for  less than two weeks and do not have pain than it is ok to stop all together.  Plan to wean off of opioids This plan should start within one week post op of your joint replacement. Maintain the same interval or time between taking each dose and first decrease the dose.  Cut the total daily intake of opioids by one tablet each day Next start to increase the time between doses. The last dose that should be eliminated is the evening dose.     MAKE SURE YOU:  Understand these instructions.  Get help right away if you are not doing well or get worse.    Thank you for letting us be a part of your medical care team.  It is a privilege we respect greatly.  We hope these instructions will help you stay on track for a fast and full recovery!   Increase activity slowly as tolerated   Complete by: As directed    Post-operative opioid taper instructions:   Complete by: As directed    POST-OPERATIVE OPIOID TAPER INSTRUCTIONS: It is important to wean off of your opioid medication as soon as possible. If you do not need pain medication after your surgery it is ok to stop day one. Opioids include: Codeine, Hydrocodone(Norco, Vicodin), Oxycodone(Percocet, oxycontin) and hydromorphone amongst others.  Long term and even short term use of opiods can cause: Increased pain response Dependence Constipation Depression Respiratory depression And more.  Withdrawal symptoms can include Flu like symptoms Nausea, vomiting And more Techniques to manage these symptoms Hydrate well Eat regular healthy meals Stay active Use relaxation techniques(deep breathing, meditating, yoga) Do Not substitute Alcohol to help with tapering If you have been on opioids for less than two weeks and do not have pain than it is ok to stop all together.  Plan to wean off of opioids This plan should start within one week post op of your joint replacement. Maintain the same interval or time between taking each dose and first  decrease the dose.  Cut the total daily intake of opioids by one tablet each day Next start to increase the time between doses. The last dose that should be eliminated is the evening dose.             Signed: Larwance Sachs Dailee Manalang 11/29/2020, 8:14 AM

## 2020-11-29 NOTE — Telephone Encounter (Signed)
Called patient but he did not answer. Left message for him to call us back.  

## 2020-12-01 DIAGNOSIS — F609 Personality disorder, unspecified: Secondary | ICD-10-CM | POA: Diagnosis not present

## 2020-12-01 DIAGNOSIS — K5900 Constipation, unspecified: Secondary | ICD-10-CM | POA: Diagnosis not present

## 2020-12-01 DIAGNOSIS — M545 Low back pain, unspecified: Secondary | ICD-10-CM | POA: Diagnosis not present

## 2020-12-01 DIAGNOSIS — G43909 Migraine, unspecified, not intractable, without status migrainosus: Secondary | ICD-10-CM | POA: Diagnosis not present

## 2020-12-01 DIAGNOSIS — G4733 Obstructive sleep apnea (adult) (pediatric): Secondary | ICD-10-CM | POA: Diagnosis not present

## 2020-12-01 DIAGNOSIS — Z471 Aftercare following joint replacement surgery: Secondary | ICD-10-CM | POA: Diagnosis not present

## 2020-12-01 DIAGNOSIS — Z96652 Presence of left artificial knee joint: Secondary | ICD-10-CM | POA: Diagnosis not present

## 2020-12-01 DIAGNOSIS — M7120 Synovial cyst of popliteal space [Baker], unspecified knee: Secondary | ICD-10-CM | POA: Diagnosis not present

## 2020-12-01 DIAGNOSIS — Z85828 Personal history of other malignant neoplasm of skin: Secondary | ICD-10-CM | POA: Diagnosis not present

## 2020-12-04 DIAGNOSIS — M545 Low back pain, unspecified: Secondary | ICD-10-CM | POA: Diagnosis not present

## 2020-12-04 DIAGNOSIS — G43909 Migraine, unspecified, not intractable, without status migrainosus: Secondary | ICD-10-CM | POA: Diagnosis not present

## 2020-12-04 DIAGNOSIS — F609 Personality disorder, unspecified: Secondary | ICD-10-CM | POA: Diagnosis not present

## 2020-12-04 DIAGNOSIS — Z96652 Presence of left artificial knee joint: Secondary | ICD-10-CM | POA: Diagnosis not present

## 2020-12-04 DIAGNOSIS — Z471 Aftercare following joint replacement surgery: Secondary | ICD-10-CM | POA: Diagnosis not present

## 2020-12-04 DIAGNOSIS — G4733 Obstructive sleep apnea (adult) (pediatric): Secondary | ICD-10-CM | POA: Diagnosis not present

## 2020-12-04 DIAGNOSIS — M7120 Synovial cyst of popliteal space [Baker], unspecified knee: Secondary | ICD-10-CM | POA: Diagnosis not present

## 2020-12-04 DIAGNOSIS — K5900 Constipation, unspecified: Secondary | ICD-10-CM | POA: Diagnosis not present

## 2020-12-04 DIAGNOSIS — Z85828 Personal history of other malignant neoplasm of skin: Secondary | ICD-10-CM | POA: Diagnosis not present

## 2020-12-06 DIAGNOSIS — Z96652 Presence of left artificial knee joint: Secondary | ICD-10-CM | POA: Diagnosis not present

## 2020-12-06 DIAGNOSIS — K5900 Constipation, unspecified: Secondary | ICD-10-CM | POA: Diagnosis not present

## 2020-12-06 DIAGNOSIS — F609 Personality disorder, unspecified: Secondary | ICD-10-CM | POA: Diagnosis not present

## 2020-12-06 DIAGNOSIS — Z471 Aftercare following joint replacement surgery: Secondary | ICD-10-CM | POA: Diagnosis not present

## 2020-12-06 DIAGNOSIS — M545 Low back pain, unspecified: Secondary | ICD-10-CM | POA: Diagnosis not present

## 2020-12-06 DIAGNOSIS — G4733 Obstructive sleep apnea (adult) (pediatric): Secondary | ICD-10-CM | POA: Diagnosis not present

## 2020-12-06 DIAGNOSIS — M7120 Synovial cyst of popliteal space [Baker], unspecified knee: Secondary | ICD-10-CM | POA: Diagnosis not present

## 2020-12-06 DIAGNOSIS — G43909 Migraine, unspecified, not intractable, without status migrainosus: Secondary | ICD-10-CM | POA: Diagnosis not present

## 2020-12-06 DIAGNOSIS — Z85828 Personal history of other malignant neoplasm of skin: Secondary | ICD-10-CM | POA: Diagnosis not present

## 2020-12-08 ENCOUNTER — Other Ambulatory Visit: Payer: Self-pay

## 2020-12-08 ENCOUNTER — Ambulatory Visit: Payer: Medicare HMO

## 2020-12-08 DIAGNOSIS — Z96652 Presence of left artificial knee joint: Secondary | ICD-10-CM | POA: Diagnosis not present

## 2020-12-08 DIAGNOSIS — G43909 Migraine, unspecified, not intractable, without status migrainosus: Secondary | ICD-10-CM | POA: Diagnosis not present

## 2020-12-08 DIAGNOSIS — M7120 Synovial cyst of popliteal space [Baker], unspecified knee: Secondary | ICD-10-CM | POA: Diagnosis not present

## 2020-12-08 DIAGNOSIS — G4733 Obstructive sleep apnea (adult) (pediatric): Secondary | ICD-10-CM

## 2020-12-08 DIAGNOSIS — Z471 Aftercare following joint replacement surgery: Secondary | ICD-10-CM | POA: Diagnosis not present

## 2020-12-08 DIAGNOSIS — F609 Personality disorder, unspecified: Secondary | ICD-10-CM | POA: Diagnosis not present

## 2020-12-08 DIAGNOSIS — M545 Low back pain, unspecified: Secondary | ICD-10-CM | POA: Diagnosis not present

## 2020-12-08 DIAGNOSIS — Z85828 Personal history of other malignant neoplasm of skin: Secondary | ICD-10-CM | POA: Diagnosis not present

## 2020-12-08 DIAGNOSIS — K5900 Constipation, unspecified: Secondary | ICD-10-CM | POA: Diagnosis not present

## 2020-12-11 DIAGNOSIS — M1712 Unilateral primary osteoarthritis, left knee: Secondary | ICD-10-CM | POA: Diagnosis not present

## 2020-12-12 DIAGNOSIS — G43909 Migraine, unspecified, not intractable, without status migrainosus: Secondary | ICD-10-CM | POA: Diagnosis not present

## 2020-12-12 DIAGNOSIS — G4733 Obstructive sleep apnea (adult) (pediatric): Secondary | ICD-10-CM | POA: Diagnosis not present

## 2020-12-12 DIAGNOSIS — Z85828 Personal history of other malignant neoplasm of skin: Secondary | ICD-10-CM | POA: Diagnosis not present

## 2020-12-12 DIAGNOSIS — R2681 Unsteadiness on feet: Secondary | ICD-10-CM | POA: Diagnosis not present

## 2020-12-12 DIAGNOSIS — Z471 Aftercare following joint replacement surgery: Secondary | ICD-10-CM | POA: Diagnosis not present

## 2020-12-12 DIAGNOSIS — K5900 Constipation, unspecified: Secondary | ICD-10-CM | POA: Diagnosis not present

## 2020-12-12 DIAGNOSIS — F609 Personality disorder, unspecified: Secondary | ICD-10-CM | POA: Diagnosis not present

## 2020-12-12 DIAGNOSIS — M7120 Synovial cyst of popliteal space [Baker], unspecified knee: Secondary | ICD-10-CM | POA: Diagnosis not present

## 2020-12-12 DIAGNOSIS — M545 Low back pain, unspecified: Secondary | ICD-10-CM | POA: Diagnosis not present

## 2020-12-12 DIAGNOSIS — M25562 Pain in left knee: Secondary | ICD-10-CM | POA: Diagnosis not present

## 2020-12-12 DIAGNOSIS — R531 Weakness: Secondary | ICD-10-CM | POA: Diagnosis not present

## 2020-12-12 DIAGNOSIS — Z96652 Presence of left artificial knee joint: Secondary | ICD-10-CM | POA: Diagnosis not present

## 2020-12-14 ENCOUNTER — Telehealth: Payer: Self-pay | Admitting: Pulmonary Disease

## 2020-12-14 DIAGNOSIS — G4733 Obstructive sleep apnea (adult) (pediatric): Secondary | ICD-10-CM

## 2020-12-14 NOTE — Telephone Encounter (Signed)
HST 12/08/20 >> AHI 21.3, SpO2 low 60%   Please let him know his home sleep study shows moderate sleep apnea.  Based on this he meets criteria for assessment to get an Inspire device.  If is still interest in pursuing this option, then please have [facility] provide him information for ENT to learn more about Inspire device for sleep apnea treatment.

## 2020-12-15 ENCOUNTER — Other Ambulatory Visit: Payer: Self-pay

## 2020-12-15 ENCOUNTER — Ambulatory Visit (INDEPENDENT_AMBULATORY_CARE_PROVIDER_SITE_OTHER): Payer: Medicare HMO | Admitting: Family Medicine

## 2020-12-15 ENCOUNTER — Encounter: Payer: Self-pay | Admitting: Family Medicine

## 2020-12-15 VITALS — BP 128/80 | HR 58 | Temp 98.5°F | Resp 16 | Ht 74.0 in | Wt 274.1 lb

## 2020-12-15 DIAGNOSIS — M25561 Pain in right knee: Secondary | ICD-10-CM | POA: Diagnosis not present

## 2020-12-15 DIAGNOSIS — G8929 Other chronic pain: Secondary | ICD-10-CM

## 2020-12-15 DIAGNOSIS — H00012 Hordeolum externum right lower eyelid: Secondary | ICD-10-CM

## 2020-12-15 DIAGNOSIS — Z6835 Body mass index (BMI) 35.0-35.9, adult: Secondary | ICD-10-CM

## 2020-12-15 DIAGNOSIS — Z96652 Presence of left artificial knee joint: Secondary | ICD-10-CM | POA: Diagnosis not present

## 2020-12-15 DIAGNOSIS — M25562 Pain in left knee: Secondary | ICD-10-CM | POA: Diagnosis not present

## 2020-12-15 DIAGNOSIS — R2681 Unsteadiness on feet: Secondary | ICD-10-CM | POA: Diagnosis not present

## 2020-12-15 DIAGNOSIS — G40909 Epilepsy, unspecified, not intractable, without status epilepticus: Secondary | ICD-10-CM | POA: Diagnosis not present

## 2020-12-15 DIAGNOSIS — R531 Weakness: Secondary | ICD-10-CM | POA: Diagnosis not present

## 2020-12-15 MED ORDER — ERYTHROMYCIN 5 MG/GM OP OINT: 1.0000 "application " | TOPICAL_OINTMENT | Freq: Every day | OPHTHALMIC | 0 refills | Status: AC

## 2020-12-15 NOTE — Patient Instructions (Addendum)
A few things to remember from today's visit:  Hordeolum of right lower eyelid, unspecified hordeolum type Local heat a few times through the day. Apply antibiotic at night. If it gets worse we may need to send you to ophthalmologist.  If you need refills please call your pharmacy. Do not use My Chart to request refills or for acute issues that need immediate attention.    Please be sure medication list is accurate. If a new problem present, please set up appointment sooner than planned today.

## 2020-12-15 NOTE — Progress Notes (Signed)
Chief Complaint  Patient presents with   Cyst    Painful red spot under the right eye that appeared Wednesday night.    HPI: [name] is a 55 y.o. male with hx of migraine,HLD, schizotypal personality disorder, and chronic pain who is here today complaining of a painful red spot that is under the right eye. It appeared on Wednesday night.  Sudden onset. Small erythematous lesion on edge of lower eye lid. No hx of trauma.  Eye Problem  The right eye is affected. This is a new problem. The current episode started in the past 7 days. The pain is at a severity of 2/10. The pain is mild. There is No known exposure to pink eye. He Does not wear contacts. Associated symptoms include eye redness, a foreign body sensation and itching. Pertinent negatives include no blurred vision, eye discharge, double vision, fever, nausea, photophobia, recent URI or vomiting. He has tried nothing for the symptoms.  Today it seems better.  States that he underwent left knee patella femoral replacement since his last visit. He is recovering well, doing PT,and following with ortho. Having right knee pain, states that the plan is to have right knee surgery once he recovers from left knee surgery. He is not exercising regularly. Trying to follow dietary recommendations. Has gained some wt. 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  He also had sleep study. Following with Dr [name].  Review of Systems  Constitutional:  Positive for fatigue. Negative for activity change, appetite change and fever.  HENT:  Negative for mouth sores and sore throat.   Eyes:  Positive for redness and itching. Negative for blurred vision, double vision, photophobia and discharge.  Respiratory:  Negative for cough and wheezing.   Cardiovascular:  Negative for chest pain and leg swelling.   Gastrointestinal:  Negative for nausea and vomiting.  Musculoskeletal:  Positive for arthralgias and back pain.  Skin:  Negative for pallor and rash.  Rest see pertinent positives and negatives per HPI.  Current Outpatient Medications on File Prior to Visit  Medication Sig Dispense Refill   aspirin EC 81 MG tablet Take 1 tablet (81 mg total) by mouth 2 (two) times daily after a meal. Swallow whole. 45 tablet 0   diclofenac (FLECTOR) 1.3 % PTCH Place 1 patch onto the skin daily as needed for pain.     Erenumab-aooe (AIMOVIG) 140 MG/ML SOAJ Inject 140 mg into the skin every 30 (thirty) days. 3 mL 3   MULTAQ 400 MG tablet TAKE 1 TABLET TWICE DAILY WITH MEALS 180 tablet 3   nortriptyline (PAMELOR) 50 MG capsule Take 50 mg by mouth at bedtime.     pravastatin (PRAVACHOL) 20 MG tablet Take 1 tablet (20 mg total) by mouth daily. 90 tablet 3   rOPINIRole (REQUIP) 0.5 MG tablet Take 1 tablet (0.5 mg total) by mouth at bedtime. 90 tablet 1   SUMAtriptan 6 MG/0.5ML SOAJ INJECT 6 MG INTO THE SKIN DAILY AS NEEDED AT MIGRAINE ONSET. MAY REPEAT 1 TIME AFTER 1 HOUR IF NEEDED 0.5 mL 0   tiZANidine (ZANAFLEX) 2 MG tablet Take 1 tablet (2 mg total) by mouth every 6 (six) hours as needed for muscle spasms. 30 tablet 1   topiramate (TOPAMAX) 50 MG tablet Take one tablet in the AM and two tablets in the PM. 270 tablet 3  traMADol (ULTRAM) 50 MG tablet Take 1 tablet (50 mg total) by mouth every 6 (six) hours as needed for moderate pain or severe pain (post op pain). 30 tablet 0   No current facility-administered medications on file prior to visit.   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   Dysrhythmia    bradycardia   Headache(784.0)    Knee pain    Low back pain    Migraines    OSA (obstructive sleep apnea) 04/10/2017   Personality disorder (HCC)    Seizures (HCC)    Allergies  Allergen Reactions   Delsym [Dextromethorphan] Other  (See Comments)    Gets loopy/memory loss   Aspirin     GI upset Other reaction(s): Vomiting   Mucinex D [Pseudoephedrine-Guaifenesin Er] Hypertension   Ibuprofen Other (See Comments)    Stomach irritation    Social History   Socioeconomic History   Marital status: Divorced    Spouse name: Not on file   Number of children: 0   Years of education: Not on file   Highest education level: Not on file  Occupational History   Occupation: disabled  Tobacco Use   Smoking status: Former    Packs/day: 1.00    Years: 1.00    Pack years: 1.00    Types: Cigarettes   Smokeless tobacco: Never   Tobacco comments:    states he only tried as a teenager  Scientific laboratory technician Use: Never used  Substance and Sexual Activity   Alcohol use: No   Drug use: No   Sexual activity: Not Currently  Other Topics Concern   Not on file  Social History Narrative   03/16/18: 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 Wife of 16 years left him about 2 years ago, and pt. has had difficulty adjusting.   Says his brother Steven Vincent, who lives in [place], is his main contact, source of support, as well as his dog.    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 States he uses painting as additional therapy.    Social Determinants of Health   Financial Resource Strain: Low Risk    Difficulty of Paying Living Expenses: Not hard at all  Food Insecurity: No Food Insecurity   Worried About Charity fundraiser in the Last Year: Never true   [place] in the Last Year: Never true  Transportation Needs: No Transportation Needs   Lack of Transportation (Medical): No   Lack of Transportation (Non-Medical): No  Physical Activity: Inactive   Days of Exercise per Week: 0 days   Minutes of Exercise per Session: 0 min  Stress: No Stress Concern Present   Feeling of Stress : Not at all  Social Connections: Socially Isolated    Frequency of Communication with Friends and Family: Never   Frequency of Social Gatherings with Friends and Family: Never   Attends Religious Services: More than 4 times per year   Active Member of Clubs or Organizations: No   Attends Archivist Meetings: Never   Marital Status: Divorced    Vitals:   12/15/20 1013  BP: 128/80  Pulse: (!) 58  Resp: 16  Temp: 98.5 F (36.9 C)  SpO2: 97%   Wt Readings from Last 3 Encounters:  12/15/20 274 lb 2 oz (124.3  kg)  11/28/20 272 lb 4.3 oz (123.5 kg)  11/15/20 272 lb 5 oz (123.5 kg)   Body mass index is 35.2 kg/m.  Physical Exam Vitals and nursing note reviewed.  Constitutional:      General: He is not in acute distress.    Appearance: He is well-developed.  HENT:     Head: Normocephalic and atraumatic.     Mouth/Throat:     Mouth: Mucous membranes are moist.     Pharynx: Oropharynx is clear.  Eyes:     General:        Right eye: Hordeolum present. No foreign body or discharge.     Conjunctiva/sclera: Conjunctivae normal.     Right eye: Right conjunctiva is not injected. No exudate or hemorrhage.    Pupils: Pupils are equal, round, and reactive to light.      Comments: 1 mm pustular lesion, not tender.   Neck:     Trachea: No tracheal deviation.  Cardiovascular:     Rate and Rhythm: Bradycardia present.  Pulmonary:     Effort: Pulmonary effort is normal. No respiratory distress.     Breath sounds: Normal breath sounds.  Lymphadenopathy:     Head:     Right side of head: No preauricular adenopathy.     Cervical: No cervical adenopathy.  Skin:    General: Skin is warm.     Findings: No erythema or rash.  Neurological:     General: No focal deficit present.     Mental Status: He is alert and oriented to person, place, and time.     Comments: Mildly antalgic gait, not assisted.  Psychiatric:     Comments: Well groomed, good eye contact.   ASSESSMENT AND PLAN:  [name] was seen today for cyst.  Diagnoses  and all orders for this visit:  Hordeolum of right lower eyelid, unspecified hordeolum type Hx and examination do not suggest a serious process. Local dry heat a few times per day, recommend microwaving rice in a sock until warm (not hot) and place it on affected area for 5 min at the time. Topical abx oint at night. Monitor for new symptoms. Instructed about warning signs.  -     erythromycin ophthalmic ointment; Place 1 application into the right eye at bedtime for 7 days.  Chronic pain of right knee Wt loss may help. Following with ortho.  Class 2 severe obesity due to excess calories with serious comorbidity and body mass index (BMI) of 35.0 to 35.9 in adult [facility]) Has gained 4 Lb since 07/2020. We discussed benefits of wt loss as well as adverse effects of obesity. Consistency with healthy diet and low impact exercise at least 3 times per week.encouraged.  Return if symptoms worsen or fail to improve.   Kimberlynn Lumbra G. Martinique, MD  [facility]. [place] office.

## 2020-12-18 NOTE — Telephone Encounter (Signed)
Spoke with patient  regarding HST results. They verbalized understanding. No further questions.  Nothing further needed at this time.   

## 2020-12-19 DIAGNOSIS — R531 Weakness: Secondary | ICD-10-CM | POA: Diagnosis not present

## 2020-12-19 DIAGNOSIS — M25562 Pain in left knee: Secondary | ICD-10-CM | POA: Diagnosis not present

## 2020-12-19 DIAGNOSIS — R2681 Unsteadiness on feet: Secondary | ICD-10-CM | POA: Diagnosis not present

## 2020-12-19 DIAGNOSIS — Z96652 Presence of left artificial knee joint: Secondary | ICD-10-CM | POA: Diagnosis not present

## 2020-12-21 DIAGNOSIS — Z96652 Presence of left artificial knee joint: Secondary | ICD-10-CM | POA: Diagnosis not present

## 2020-12-21 DIAGNOSIS — F2089 Other schizophrenia: Secondary | ICD-10-CM | POA: Diagnosis not present

## 2020-12-21 DIAGNOSIS — M25562 Pain in left knee: Secondary | ICD-10-CM | POA: Diagnosis not present

## 2020-12-21 DIAGNOSIS — R2681 Unsteadiness on feet: Secondary | ICD-10-CM | POA: Diagnosis not present

## 2020-12-21 DIAGNOSIS — R531 Weakness: Secondary | ICD-10-CM | POA: Diagnosis not present

## 2020-12-22 DIAGNOSIS — R531 Weakness: Secondary | ICD-10-CM | POA: Diagnosis not present

## 2020-12-22 DIAGNOSIS — M25562 Pain in left knee: Secondary | ICD-10-CM | POA: Diagnosis not present

## 2020-12-22 DIAGNOSIS — Z96652 Presence of left artificial knee joint: Secondary | ICD-10-CM | POA: Diagnosis not present

## 2020-12-22 DIAGNOSIS — R2681 Unsteadiness on feet: Secondary | ICD-10-CM | POA: Diagnosis not present

## 2020-12-26 DIAGNOSIS — R2681 Unsteadiness on feet: Secondary | ICD-10-CM | POA: Diagnosis not present

## 2020-12-26 DIAGNOSIS — R531 Weakness: Secondary | ICD-10-CM | POA: Diagnosis not present

## 2020-12-26 DIAGNOSIS — Z96652 Presence of left artificial knee joint: Secondary | ICD-10-CM | POA: Diagnosis not present

## 2020-12-26 DIAGNOSIS — M25562 Pain in left knee: Secondary | ICD-10-CM | POA: Diagnosis not present

## 2020-12-29 ENCOUNTER — Encounter (HOSPITAL_BASED_OUTPATIENT_CLINIC_OR_DEPARTMENT_OTHER): Payer: Self-pay

## 2020-12-29 ENCOUNTER — Telehealth: Payer: Self-pay | Admitting: Cardiology

## 2020-12-29 NOTE — Telephone Encounter (Signed)
Please advise 

## 2020-12-29 NOTE — Telephone Encounter (Signed)
Patient called and said that his Social Security pulled his check and he does not get a government check anymore. The patient got a letter in the mail yesterday that states he needs to prove why he needs to be getting social security.  He needs a letter from all of his Doctors to help support his chase. He does not have a job and would like to get any help that he needs   Please let the patient know what our office can do to help him

## 2020-12-29 NOTE — Telephone Encounter (Signed)
Letter created, will fax to [place] office!

## 2021-01-01 ENCOUNTER — Telehealth: Payer: Self-pay

## 2021-01-01 NOTE — Telephone Encounter (Signed)
Spoke with pt, he has picked up the letter.

## 2021-01-01 NOTE — Telephone Encounter (Signed)
Patient called requesting a letter that proves he is disabled and that he is medically in need of SSI because his SSI check was recently discontinued letter is need before the end of the month patient asked for a call back

## 2021-01-01 NOTE — Telephone Encounter (Signed)
Okay for letter

## 2021-01-02 ENCOUNTER — Encounter: Payer: Self-pay | Admitting: Family Medicine

## 2021-01-02 DIAGNOSIS — M545 Low back pain, unspecified: Secondary | ICD-10-CM | POA: Diagnosis not present

## 2021-01-02 NOTE — Telephone Encounter (Signed)
Pt is aware that letter is up front & ready for pick up.

## 2021-01-02 NOTE — Telephone Encounter (Signed)
Letter created, will notify pt it is ready for pick up.

## 2021-01-06 ENCOUNTER — Other Ambulatory Visit: Payer: Self-pay | Admitting: Neurology

## 2021-01-10 DIAGNOSIS — J343 Hypertrophy of nasal turbinates: Secondary | ICD-10-CM | POA: Diagnosis not present

## 2021-01-10 DIAGNOSIS — G4733 Obstructive sleep apnea (adult) (pediatric): Secondary | ICD-10-CM | POA: Diagnosis not present

## 2021-01-11 ENCOUNTER — Other Ambulatory Visit: Payer: Self-pay | Admitting: Neurology

## 2021-01-15 ENCOUNTER — Telehealth: Payer: Self-pay | Admitting: Pharmacist

## 2021-01-15 NOTE — Chronic Care Management (AMB) (Signed)
    Chronic Care Management Pharmacy Assistant   Name: Steven Vincent  MRN: [id number] DOB: 1965/04/14  01/15/21 APPOINTMENT REMINDER   Called Patient No answer, left message of appointment on 01/16/21 at 9 via telephone visit with Jeni Salles, Pharm D.   Notified to have all medications, supplements, blood pressure and/or blood sugar logs available during appointment and to return call if need to reschedule.   Care Gaps: Covid Vaccines - Overdue PNA Vaccines - Overdue Zoster Vaccines - Overdue Flu Vaccines - Overdue AWV- 2/22  Star Rating Drug: Pravastatin (Pravachol) 20 mg - Last filled 10/10/20 90 DS at [facility]  Any gaps in medications fill history? None    Medications: Outpatient Encounter Medications as of 01/15/2021  Medication Sig   aspirin EC 81 MG tablet Take 1 tablet (81 mg total) by mouth 2 (two) times daily after a meal. Swallow whole.   diclofenac (FLECTOR) 1.3 % PTCH Place 1 patch onto the skin daily as needed for pain.   Erenumab-aooe (AIMOVIG) 140 MG/ML SOAJ Inject 140 mg into the skin every 30 (thirty) days.   MULTAQ 400 MG tablet TAKE 1 TABLET TWICE DAILY WITH MEALS   nortriptyline (PAMELOR) 50 MG capsule Take 50 mg by mouth at bedtime.   pravastatin (PRAVACHOL) 20 MG tablet Take 1 tablet (20 mg total) by mouth daily.   rOPINIRole (REQUIP) 0.5 MG tablet Take 1 tablet (0.5 mg total) by mouth at bedtime.   SUMAtriptan 6 MG/0.5ML SOAJ INJECT 6 MG INTO THE SKIN DAILY AS NEEDED AT MIGRAINE ONSET. MAY REPEAT 1 TIME AFTER 1 HOUR IF NEEDED   tiZANidine (ZANAFLEX) 2 MG tablet Take 1 tablet (2 mg total) by mouth every 6 (six) hours as needed for muscle spasms.   topiramate (TOPAMAX) 50 MG tablet Take one tablet in the AM and two tablets in the PM.   traMADol (ULTRAM) 50 MG tablet Take 1 tablet (50 mg total) by mouth every 6 (six) hours as needed for moderate pain or severe pain (post op pain).   No facility-administered encounter medications on file as of  01/15/2021.  [place] Clinical Pharmacist Assistant [phone]

## 2021-01-16 ENCOUNTER — Ambulatory Visit (INDEPENDENT_AMBULATORY_CARE_PROVIDER_SITE_OTHER): Payer: Medicare HMO | Admitting: Pharmacist

## 2021-01-16 DIAGNOSIS — J309 Allergic rhinitis, unspecified: Secondary | ICD-10-CM

## 2021-01-16 DIAGNOSIS — E782 Mixed hyperlipidemia: Secondary | ICD-10-CM

## 2021-01-16 DIAGNOSIS — F2089 Other schizophrenia: Secondary | ICD-10-CM | POA: Diagnosis not present

## 2021-01-16 NOTE — Patient Instructions (Addendum)
Hi Kazim,  It was great to catch up with you!  Please reach out to me if you have any questions or need anything!  Best, Maddie  Jeni Salles, PharmD, [facility] Pharmacist [place] [facility] [place]   Visit Information   Goals Addressed   None    Patient Care Plan: CCM Pharmacy Care Plan     Problem Identified: Problem: Hyperlipidemia, GERD and Migraines, Personality disorder      Long-Range Goal: Patient-Specific Goal   Start Date: 07/20/2020  Expected End Date: 07/20/2021  Recent Progress: On track  Priority: High  Note:   Current Barriers:  Unable to independently monitor therapeutic efficacy Unable to self administer medications as prescribed  Pharmacist Clinical Goal(s):  Patient will achieve adherence to monitoring guidelines and medication adherence to achieve therapeutic efficacy achieve ability to self administer medications as prescribed through use of an alarm as evidenced by patient report through collaboration with PharmD and provider.   Interventions: 1:1 collaboration with Martinique, Betty G, MD regarding development and update of comprehensive plan of care as evidenced by provider attestation and co-signature Inter-disciplinary care team collaboration (see longitudinal plan of care) Comprehensive medication review performed; medication list updated in electronic medical record  Hyperlipidemia: (LDL goal < 100) -Controlled -Current treatment: Pravastatin 20 mg 1 tablet daily  -Medications previously tried: none  -Current dietary patterns: not eating out much; eating leaner meats -Current exercise habits: pushing carts at work; no structured exercsie -Educated on Cholesterol goals;  Benefits of statin for ASCVD risk reduction; Importance of limiting foods high in cholesterol; Exercise goal of 150 minutes per week; -Counseled on diet and exercise extensively  PVCs (Goal: prevent heart events) -Controlled -Current  treatment: Rate control: Multaq 400 mg 1 tablet twice daily with meals -Medications previously tried: none -Home BP and HR readings: does not check at home  -Counseled on  importance of monitoring HR at home -Recommended to continue current medication  Personality disorder/insomnia (Goal: minimize symptoms and improve sleep) -Uncontrolled -Current treatment: No medications -Medications previously tried/failed: notriptyline (constipation and not effective) -Educated on Benefits of medication for symptom control Benefits of cognitive-behavioral therapy with or without medication -Counseled on practicing good sleep hygiene by setting a sleep schedule and maintaining it, avoid excessive napping, following a nightly routine, avoiding screen time for 30-60 minutes before going to bed, and making the bedroom a cool, quiet and dark space  Migraines (Goal: minimize symptoms and severity of migraines) -Controlled -Current treatment  Aimovig 140 mg/mL inject into the skin every 30 days Sumatriptan 0.6 mg/0.5 mL inject into the skin as needed Topiramate 50 mg 1 tablet in the morning and 2 tablets in the evening -Medications previously tried: n/a  -Recommended to continue current medication  Restless legs syndrome (Goal: minimize symptoms) -Controlled -Current treatment  Ropinirole 0.25 mg 1 tablet at bedtime - taking 2 tablets -Medications previously tried: none  -Counseled on getting an updated prescription for 2 tablets daily  Osteoarthritis (Goal: minimize symptoms) -Not ideally controlled -Current treatment  Meloxicam 15 mg 1 tablet daily as needed - taking daily -Medications previously tried: n/a  -Educated on risk for bleeding with NSAIDs given history of ulcers. Recommended Tylenol for pain.   Health Maintenance -Vaccine gaps: shingles, COVID, tetanus -Current therapy:  Multivitamin 1 tablet daily -Educated on Cost vs benefit of each product must be carefully weighed by  individual consumer -Patient is satisfied with current therapy and denies issues -Recommended to continue current medication  Patient Goals/Self-Care Activities Patient will:  -  take medications as prescribed focus on medication adherence by setting an alarm for medication reminders target a minimum of 150 minutes of moderate intensity exercise weekly engage in dietary modifications by limiting intake of sweets  Follow Up Plan: The care management team will reach out to the patient again over the next 30 days.        Patient verbalizes understanding of instructions provided today and agrees to view in [place].  The pharmacy team will reach out to the patient again over the next 30 days.   Viona Gilmore, [facility]

## 2021-01-16 NOTE — Progress Notes (Signed)
Chronic Care Management Pharmacy Note  01/16/2021 Name:  Ronson Hagins MRN:  [id number] DOB:  04-Jan-1966  Summary: Patient is improving with taking medications regularly  Recommendations/Changes made from today's visit: -Recommend social work referral for assistance with housing -Recommended to discuss aspirin therapy with cardiology -Recommended dietician referral  Plan: -Follow up with referral to dietician  Subjective: Jaquise Faux is an 55 y.o. year old male who is a primary patient of Martinique, Malka So, MD.  The CCM team was consulted for assistance with disease management and care coordination needs.    Engaged with patient by telephone for follow up visit in response to provider referral for pharmacy case management and/or care coordination services.   Consent to Services:  The patient was given information about Chronic Care Management services, agreed to services, and gave verbal consent prior to initiation of services.  Please see initial visit note for detailed documentation.   Patient Care Team: Martinique, Betty G, MD as PCP - General (Family Medicine) Minus Breeding, MD as PCP - Cardiology (Cardiology) Constance Haw, MD as PCP - Electrophysiology (Cardiology) Norma Fredrickson, MD as Consulting Physician (Psychiatry) Minus Breeding, MD as Consulting Physician (Cardiology) Phylliss Bob, MD as Consulting Physician (Orthopedic Surgery) Kathrynn Ducking, MD as Consulting Physician (Neurology) Viona Gilmore, [facility] as Pharmacist (Pharmacist)  Recent office visits: 12/15/20 Betty Martinique MD: Patient presented for cyst under eye. Prescribed erythromycin ointment.  08-04-2020 Martinique, Betty G, MD- Patient presents for Routine general medical examination and other concerns.No medication changes.  Recent consult visits: 01/10/21 Melida Quitter, MD (ENT): Patient presented for initial sleep apnea consult.   10/25/20 Melrose Nakayama (ortho): Patient presented  for pain in right knee. Unable to access notes.  10/10/20 [name], MD (pulmonary): Patient presented for OSA follow up. Plan for sleep study.  09/25/20 Norma Fredrickson (psychiatry): Patient presented for schizophrenia. Unable to access notes.  09/25/20 Benjiman Core (Psychotherapy) -  Patient presented for developmental disorder of scholastic skills.    08-16-2020 Minus Breeding, MD ( Cardiology) - Patient presented for premature ventricular contraction and other concerns. No medication changes.   08-02-2020 Normajean Glasgow (Physical Med) - Patient presented for PR INJ DX/THER AGNT PARAVERT FACET JOINT ,IMG GUIDE,LUMBAR/SAC, 1ST LEVEL.   05/15/20 Rainey Pines (General Practice) - seen for adjustment disorder with anxiety and mild intellectual disabilities. No medication changes or follow up noted.    05/08/20 Inocencio Homes MD (Podiatry) - seen for left foot drop. No medication changes or follow up noted.    04/26/20 Butler Denmark NP (Neurology) -  Seen for migraine without aura and without status migrainosus. No medication changes. Follow up in 1 year.   Hospital visits: 10/4-10/5/22 Patient admitted to [facility] for left knee patella femoral replacement.    Objective:  Lab Results  Component Value Date   CREATININE 0.92 11/15/2020   BUN 22 (H) 11/15/2020   GFR 92.77 08/04/2020   GFRNONAA >60 11/15/2020   GFRAA 90 04/19/2019   NA 140 11/15/2020   K 4.0 11/15/2020   CALCIUM 9.2 11/15/2020   CO2 22 11/15/2020   GLUCOSE 90 11/15/2020    Lab Results  Component Value Date/Time   HGBA1C 5.3 08/04/2020 09:53 AM   HGBA1C 4.9 09/21/2018 03:51 PM   GFR 92.77 08/04/2020 09:53 AM   GFR 86.33 09/30/2017 10:19 AM    Last diabetic Eye exam: No results found for: HMDIABEYEEXA  Last diabetic Foot exam: No results found for: HMDIABFOOTEX   Lab  Results  Component Value Date   CHOL 146 08/04/2020   HDL 38.10 (L) 08/04/2020   LDLCALC 84 08/04/2020   LDLDIRECT 70.0 07/07/2018    TRIG 119.0 08/04/2020   CHOLHDL 4 08/04/2020    Hepatic Function Latest Ref Rng & Units 08/04/2020 07/07/2018 02/21/2016  Total Protein 6.0 - 8.3 g/dL 7.0 7.3 6.3  Albumin 3.5 - 5.2 g/dL 4.5 4.5 4.1  AST 0 - 37 U/L '20 24 21  ' ALT 0 - 53 U/L '23 25 21  ' Alk Phosphatase 39 - 117 U/L 62 71 78  Total Bilirubin 0.2 - 1.2 mg/dL 0.4 0.5 0.4  Bilirubin, Direct 0.0 - 0.3 mg/dL - 0.1 -    Lab Results  Component Value Date/Time   TSH 3.04 02/09/2020 03:11 PM   TSH 5.86 (H) 01/25/2020 10:35 AM   FREET4 1.5 02/09/2020 03:11 PM   FREET4 1.07 03/17/2017 11:02 AM    CBC Latest Ref Rng & Units 11/15/2020 01/13/2020 05/04/2018  WBC 4.0 - 10.5 K/uL 6.9 7.1 6.6  Hemoglobin 13.0 - 17.0 g/dL 15.0 14.4 13.9  Hematocrit 39.0 - 52.0 % 42.4 40.4 42.2  Platelets 150 - 400 K/uL 166 161 161    Lab Results  Component Value Date/Time   VD25OH 45.27 10/16/2016 09:00 AM    Clinical ASCVD: No  The 10-year ASCVD risk score (Arnett DK, et al., 2019) is: 6.5%   Values used to calculate the score:     Age: 62 years     Sex: Male     Is Non-Hispanic African American: No     Diabetic: No     Tobacco smoker: No     Systolic Blood Pressure: 122 mmHg     Is BP treated: No     HDL Cholesterol: 38.1 mg/dL     Total Cholesterol: 146 mg/dL    Depression screen [facility] 2/9 04/05/2020 03/17/2019 03/16/2018  Decreased Interest 0 0 0  Down, Depressed, Hopeless 0 0 0  PHQ - 2 Score 0 0 0  Altered sleeping - - 3  Tired, decreased energy - - 3  Change in appetite - - 1  Feeling bad or failure about yourself  - - 0  Trouble concentrating - - 0  Moving slowly or fidgety/restless - - 0  Suicidal thoughts - - 0  PHQ-9 Score - - 7  Difficult doing work/chores - - Somewhat difficult  Some recent data might be hidden      Social History   Tobacco Use  Smoking Status Former   Packs/day: 1.00   Years: 1.00   Pack years: 1.00   Types: Cigarettes  Smokeless Tobacco Never  Tobacco Comments   states he only tried as a  teenager   BP Readings from Last 3 Encounters:  12/15/20 128/80  11/29/20 (!) 133/94  11/15/20 (!) 147/87   Pulse Readings from Last 3 Encounters:  12/15/20 (!) 58  11/29/20 70  11/15/20 64   Wt Readings from Last 3 Encounters:  12/15/20 274 lb 2 oz (124.3 kg)  11/28/20 272 lb 4.3 oz (123.5 kg)  11/15/20 272 lb 5 oz (123.5 kg)   BMI Readings from Last 3 Encounters:  12/15/20 35.20 kg/m  11/28/20 34.96 kg/m  11/15/20 34.96 kg/m    Assessment/Interventions: Review of patient past medical history, allergies, medications, health status, including review of consultants reports, laboratory and other test data, was performed as part of comprehensive evaluation and provision of chronic care management services.   SDOH:  (Social Determinants of  Health) assessments and interventions performed: No   SDOH Screenings   Alcohol Screen: Low Risk    Last Alcohol Screening Score (AUDIT): 0  Depression (PHQ2-9): Low Risk    PHQ-2 Score: 0  Financial Resource Strain: Low Risk    Difficulty of Paying Living Expenses: Not hard at all  Food Insecurity: No Food Insecurity   Worried About Charity fundraiser in the Last Year: Never true   Ran Out of Food in the Last Year: Never true  Housing: Low Risk    Last Housing Risk Score: 0  Physical Activity: Inactive   Days of Exercise per Week: 0 days   Minutes of Exercise per Session: 0 min  Social Connections: Socially Isolated   Frequency of Communication with Friends and Family: Never   Frequency of Social Gatherings with Friends and Family: Never   Attends Religious Services: More than 4 times per year   Active Member of [employer] or Organizations: No   Attends Music therapist: Never   Marital Status: Divorced  Stress: No Stress Concern Present   Feeling of Stress : Not at all  Tobacco Use: Medium Risk   Smoking Tobacco Use: Former   Smokeless Tobacco Use: Never   Passive Exposure: Not on Pensions consultant Needs: No  Transportation Needs   Lack of Transportation (Medical): No   Lack of Transportation (Non-Medical): No    CCM Care Plan  Allergies  Allergen Reactions   Delsym [Dextromethorphan] Other (See Comments)    Gets loopy/memory loss   Aspirin     GI upset Other reaction(s): Vomiting   Mucinex D [Pseudoephedrine-Guaifenesin Er] Hypertension   Ibuprofen Other (See Comments)    Stomach irritation    Medications Reviewed Today     Reviewed by Viona Gilmore, [facility] (Pharmacist) on 01/16/21 at [place] List Status: <None>   Medication Order Taking? Sig Documenting Provider Last Dose Status Informant  aspirin EC 81 MG tablet [id number]  Take 1 tablet (81 mg total) by mouth 2 (two) times daily after a meal. Swallow whole. Loni Dolly, PA-C  Active   diclofenac (FLECTOR) 1.3 % [facility] [id number]  Place 1 patch onto the skin daily as needed for pain. [provider]  Active Self  Erenumab-aooe (AIMOVIG) 140 MG/ML SOAJ [id number]  Inject 140 mg into the skin every 30 (thirty) days. Suzzanne Cloud, NP  Active Self  MULTAQ 400 MG tablet [id number]  TAKE 1 TABLET TWICE DAILY WITH MEALS Minus Breeding, MD  Active Self    Discontinued 01/16/21 [phone] (Patient Preference)   pravastatin (PRAVACHOL) 20 MG tablet [id number]  Take 1 tablet (20 mg total) by mouth daily. Minus Breeding, MD  Active Self  rOPINIRole (REQUIP) 0.5 MG tablet [id number]  TAKE 1 TABLET AT BEDTIME Britt Bottom, MD  Active   SUMAtriptan 6 MG/0.5ML Darden Palmer [id number]  INJECT 6 MG INTO THE SKIN DAILY AS NEEDED AT MIGRAINE ONSET. MAY REPEAT 1 TIME AFTER 1 HOUR IF NEEDED McCue, Jessica, NP  Active Self  tiZANidine (ZANAFLEX) 2 MG tablet [id number]  Take 1 tablet (2 mg total) by mouth every 6 (six) hours as needed for muscle spasms. Loni Dolly, PA-C  Active   topiramate (TOPAMAX) 50 MG tablet [id number]  Take one tablet in the AM and two tablets in the PM. Suzzanne Cloud, NP  Active Self  traMADol (ULTRAM) 50 MG tablet [id number]  Take 1  tablet (50 mg total) by mouth every 6 (six) hours as needed for  moderate pain or severe pain (post op pain). Loni Dolly, PA-C  Active             Patient Active Problem List   Diagnosis Date Noted   Primary osteoarthritis of left knee 11/28/2020   Educated about COVID-19 virus infection 07/22/2019   Generalized osteoarthritis of multiple sites 04/19/2019   Fatigue 09/21/2018   ED (erectile dysfunction) 07/06/2018   Allergic rhinitis 03/09/2018   Insomnia 03/06/2018   DJD (degenerative joint disease), lumbosacral 06/19/2017   Bradycardia 04/14/2017   OSA (obstructive sleep apnea) 04/10/2017   Syncope 01/02/2017   PVC (premature ventricular contraction) 01/02/2017   Class 2 obesity with body mass index (BMI) of 35.0 to 35.9 in adult 11/13/2016   Chest pain, unspecified 10/16/2016   Hyperlipidemia 10/16/2016   Radicular pain of left lower extremity 01/23/2016   RLS (restless legs syndrome) 01/23/2016   Hemiballismus 01/17/2015   Migraine without aura 01/19/2013   Migraine 08/03/2010   Hip pain, left 04/26/2010   Hemorrhoids, internal, with bleeding 04/26/2010   Daytime hypersomnolence 11/14/2009   REACTIVE HYPOGLYCEMIA 04/13/2008   WEIGHT GAIN 04/13/2008   SCHIZOTYPAL PERSONALITY DISORDER 07/07/2007   Low back pain with radiation 11/06/2006   Seizures ([place]) 11/06/2006   Headache, unspecified headache type 11/06/2006    Immunization History  Administered Date(s) Administered   Influenza Split 12/03/2010   Influenza Whole 12/04/2006, 11/17/2007, 01/11/2009   Influenza,inj,Quad PF,6+ Mos 10/25/2018   Td 12/04/2006   Patient is still on a waiting list for his housing. He has temporary housing right now.  Patient lost his job with [employer] after the surgery because they were not helping him succeed.  Patient does try to eat 3 meals a day and is working on weight loss. He does drink a lot of water during the day.   Conditions to be addressed/monitored:  Hyperlipidemia,  GERD, and Migraines, Personality disorder, Insomnia  Conditions addressed this visit: Hyperlipidemia, insomnia   Care Plan : [place]  Updates made by Viona Gilmore, [employer] since 01/16/2021 12:00 AM     Problem: Problem: Hyperlipidemia, GERD and Migraines, Personality disorder      Long-Range Goal: Patient-Specific Goal   Start Date: 07/20/2020  Expected End Date: 07/20/2021  Recent Progress: On track  Priority: High  Note:   Current Barriers:  Unable to independently monitor therapeutic efficacy Unable to self administer medications as prescribed  Pharmacist Clinical Goal(s):  Patient will achieve adherence to monitoring guidelines and medication adherence to achieve therapeutic efficacy achieve ability to self administer medications as prescribed through use of an alarm as evidenced by patient report through collaboration with PharmD and provider.   Interventions: 1:1 collaboration with Martinique, Betty G, MD regarding development and update of comprehensive plan of care as evidenced by provider attestation and co-signature Inter-disciplinary care team collaboration (see longitudinal plan of care) Comprehensive medication review performed; medication list updated in electronic medical record  Hyperlipidemia: (LDL goal < 100) -Controlled -Current treatment: Pravastatin 20 mg 1 tablet daily  -Medications previously tried: none  -Current dietary patterns: not eating out much; eating leaner meats -Current exercise habits: pushing carts at work; no structured exercsie -Educated on Cholesterol goals;  Benefits of statin for ASCVD risk reduction; Importance of limiting foods high in cholesterol; Exercise goal of 150 minutes per week; -Counseled on diet and exercise extensively  PVCs (Goal: prevent heart events) -Controlled -Current treatment: Rate control: Multaq 400 mg 1 tablet twice daily with meals -Medications previously tried: none -Home BP  and HR readings:  does not check at home  -Counseled on  importance of monitoring HR at home -Recommended to continue current medication  Personality disorder/insomnia (Goal: minimize symptoms and improve sleep) -Uncontrolled -Current treatment: No medications -Medications previously tried/failed: notriptyline (constipation and not effective) -Educated on Benefits of medication for symptom control Benefits of cognitive-behavioral therapy with or without medication -Counseled on practicing good sleep hygiene by setting a sleep schedule and maintaining it, avoid excessive napping, following a nightly routine, avoiding screen time for 30-60 minutes before going to bed, and making the bedroom a cool, quiet and dark space  Migraines (Goal: minimize symptoms and severity of migraines) -Controlled -Current treatment  Aimovig 140 mg/mL inject into the skin every 30 days Sumatriptan 0.6 mg/0.5 mL inject into the skin as needed Topiramate 50 mg 1 tablet in the morning and 2 tablets in the evening -Medications previously tried: n/a  -Recommended to continue current medication  Restless legs syndrome (Goal: minimize symptoms) -Controlled -Current treatment  Ropinirole 0.25 mg 1 tablet at bedtime - taking 2 tablets -Medications previously tried: none  -Counseled on getting an updated prescription for 2 tablets daily  Osteoarthritis (Goal: minimize symptoms) -Not ideally controlled -Current treatment  Meloxicam 15 mg 1 tablet daily as needed - taking daily -Medications previously tried: n/a  -Educated on risk for bleeding with NSAIDs given history of ulcers. Recommended Tylenol for pain.   Health Maintenance -Vaccine gaps: shingles, COVID, tetanus -Current therapy:  Multivitamin 1 tablet daily -Educated on Cost vs benefit of each product must be carefully weighed by individual consumer -Patient is satisfied with current therapy and denies issues -Recommended to continue current medication  Patient  Goals/Self-Care Activities Patient will:  - take medications as prescribed focus on medication adherence by setting an alarm for medication reminders target a minimum of 150 minutes of moderate intensity exercise weekly engage in dietary modifications by limiting intake of sweets  Follow Up Plan: The care management team will reach out to the patient again over the next 30 days.         Medication Assistance: None required.  Patient affirms current coverage meets needs.  Compliance/Adherence/Medication fill history: Care Gaps: Shingrix, COVID, influenza, Prevnar 20  Star-Rating Drugs: Pravastatin (Pravachol) 20 mg - Last [facility]/28/20 90 DS at [facility]  Patient's preferred pharmacy is:  [id number]: [phone]: [phone] [place]: [phone]: [phone] [place]Phone: [phone]: [phone] Uses pill box? Yes Pt endorses 80% compliance  We discussed: Benefits of medication synchronization, packaging and delivery as well as enhanced pharmacist oversight with [employer]. Patient decided to: Continue current medication management strategy  Care Plan and Follow Up Patient Decision:  Patient agrees to Care Plan and Follow-up.  Plan: The care management team will reach out to the patient again over the next 30 days.  MJeni Salles PharmD BVirginia Gay HospitalClinical Pharmacist [place]Malver[place]at [place]

## 2021-01-17 ENCOUNTER — Telehealth: Payer: Self-pay

## 2021-01-17 DIAGNOSIS — E782 Mixed hyperlipidemia: Secondary | ICD-10-CM

## 2021-01-17 DIAGNOSIS — Z6835 Body mass index (BMI) 35.0-35.9, adult: Secondary | ICD-10-CM

## 2021-01-17 NOTE — Telephone Encounter (Signed)
-----   Message from Betty G Martinique, MD sent at 01/17/2021  3:25 PM EST ----- Regarding: RE: Dietician/weight management referral It is ok to place referral for nutritionist as requested. Thanks, BJ ----- Message ----- From: Viona Gilmore, [facility] Sent: 01/16/2021   9:44 AM EST To: Betty G Martinique, MD, Rodrigo Ran, CMA Subject: Dietician/weight management referral           Hi,  While speaking with Barbaraann Rondo, he requested some help with losing weight. I know it may not be covered through his insurance and I told him that but can you put in a referral for either a dietician or weight management or both? I think he would benefit from either one.  Thank you! Maddie

## 2021-01-22 ENCOUNTER — Telehealth: Payer: Self-pay | Admitting: Adult Health

## 2021-01-22 DIAGNOSIS — M25562 Pain in left knee: Secondary | ICD-10-CM | POA: Diagnosis not present

## 2021-01-22 DIAGNOSIS — Z9889 Other specified postprocedural states: Secondary | ICD-10-CM | POA: Diagnosis not present

## 2021-01-22 MED ORDER — SUMATRIPTAN SUCCINATE 6 MG/0.5ML ~~LOC~~ SOAJ: SUBCUTANEOUS | 0 refills | Status: DC

## 2021-01-22 NOTE — Addendum Note (Signed)
Addended by: Verlin Grills on: 01/22/2021 11:42 AM   Modules accepted: Orders

## 2021-01-22 NOTE — Telephone Encounter (Signed)
Pt requesting refill for SUMAtriptan 6 MG/0.5ML SOAJ. Pharmacy [id number] [address], [place].

## 2021-01-22 NOTE — Telephone Encounter (Signed)
Refill has been sent.  °

## 2021-01-24 DIAGNOSIS — J309 Allergic rhinitis, unspecified: Secondary | ICD-10-CM

## 2021-01-24 DIAGNOSIS — K219 Gastro-esophageal reflux disease without esophagitis: Secondary | ICD-10-CM

## 2021-01-24 DIAGNOSIS — E782 Mixed hyperlipidemia: Secondary | ICD-10-CM

## 2021-01-24 DIAGNOSIS — G4700 Insomnia, unspecified: Secondary | ICD-10-CM

## 2021-01-24 DIAGNOSIS — G43909 Migraine, unspecified, not intractable, without status migrainosus: Secondary | ICD-10-CM | POA: Diagnosis not present

## 2021-01-24 DIAGNOSIS — F609 Personality disorder, unspecified: Secondary | ICD-10-CM

## 2021-01-24 DIAGNOSIS — G2581 Restless legs syndrome: Secondary | ICD-10-CM | POA: Diagnosis not present

## 2021-01-24 DIAGNOSIS — M1990 Unspecified osteoarthritis, unspecified site: Secondary | ICD-10-CM

## 2021-01-25 ENCOUNTER — Encounter: Payer: Self-pay | Admitting: Cardiology

## 2021-01-29 ENCOUNTER — Ambulatory Visit (INDEPENDENT_AMBULATORY_CARE_PROVIDER_SITE_OTHER): Payer: Medicare HMO | Admitting: Cardiology

## 2021-01-29 ENCOUNTER — Other Ambulatory Visit: Payer: Self-pay

## 2021-01-29 ENCOUNTER — Encounter: Payer: Self-pay | Admitting: Cardiology

## 2021-01-29 VITALS — BP 132/80 | HR 65 | Ht 74.0 in | Wt 274.0 lb

## 2021-01-29 DIAGNOSIS — R0602 Shortness of breath: Secondary | ICD-10-CM

## 2021-01-29 NOTE — Progress Notes (Signed)
Cardiology Office Note   Date:  01/29/2021   ID:  Steven Vincent, DOB 1965-03-31, MRN [id number]  PCP:  Martinique, Betty G, MD  Cardiologist:   Minus Breeding, MD   Chief Complaint  Patient presents with   Shortness of Breath        History of Present Illness: Steven Vincent is a 55 y.o. male who presents for follow up of PVCs and bradycardia.   POET (Plain Old Exercise Treadmill)  in January 2018 was normal. Holter in October 2018 showed frequent PVCs with burden 16%. Average HR 66. Slowest HR 40 while asleep. Beta blocker was reduced and eventually stopped. Echo was normal.   In 2019 Lexiscan  Myoview showed a small fixed apical defect and was otherwise normal. He had repeat Holter in December 2019 showing again frequent PVCs with a burden of 26%. 4 beat run NSVT. Minimum HR 58.  He was seen in the ED on March 9 with concern about slow HR, chest pain, and fatigue. Noted to have pulse of 40 by pulse ox reading. ECG showed frequent PVCs with bigeminy and couplets.   He was eventually treated with Multaq.  He had much improvement in symptoms.    Late last year he  had chest pain.   He had a low risk perfusion study.    He still short of breath.  However, he says he can climb a few flights of stairs before he gets short of breath.  His symptoms are somewhat vague but he is not describing PND or orthopnea.  He is really not volunteering new chest discomfort.  He does feels palpitations and he says his heart rate goes down to the 30s but I think this is under counting premature patients.  He has not had any presyncope or syncope.  His biggest complaints are really social issues.  He is having trouble getting a job assigned or housing.  Sounds like he lives in some substandard housing currently.  It does sound like he gets his medications.  He is being considered for hypoglossal nerve stimulator for treatment of his sleep apnea as he cannot use CPAP.   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 Swallow whole.     diclofenac (FLECTOR) 1.3 % PTCH Place 1 patch onto the skin daily as needed for pain.     Erenumab-aooe (AIMOVIG) 140 MG/ML SOAJ Inject 140 mg into the skin every 30 (thirty) days. 3 mL 3   meloxicam (MOBIC) 15 MG tablet      MULTAQ 400 MG tablet TAKE 1 TABLET TWICE DAILY WITH MEALS 180 tablet 3   pravastatin (PRAVACHOL) 20 MG tablet Take 1 tablet (20 mg total)  by mouth daily. 90 tablet 3   rOPINIRole (REQUIP) 0.5 MG tablet TAKE 1 TABLET AT BEDTIME 90 tablet 1   SUMAtriptan 6 MG/0.5ML SOAJ INJECT 6 MG INTO THE SKIN DAILY AS NEEDED AT MIGRAINE ONSET. MAY REPEAT 1 TIME AFTER 1 HOUR IF NEEDED 0.5 mL 0   tiZANidine (ZANAFLEX) 2 MG tablet Take 1 tablet (2 mg total) by mouth every 6 (six) hours as needed for muscle spasms. 30 tablet 1   topiramate (TOPAMAX) 50 MG tablet Take one tablet in the AM and two tablets in the PM. 270 tablet 3   traMADol (ULTRAM) 50 MG tablet Take 1 tablet (50 mg total) by mouth every 6 (six) hours as needed for moderate pain or severe pain (post op pain). 30 tablet 0   No current facility-administered medications for this visit.    Allergies:   Delsym [dextromethorphan], Aspirin, Mucinex d [pseudoephedrine-guaifenesin er], and Ibuprofen    ROS:  Please see the history of present illness.    Otherwise, review of systems are positive for none.   All other systems are reviewed and negative.    PHYSICAL EXAM: VS:  BP 132/80   Pulse 65   Ht 6\' 2"  (1.88 m)   Wt 274 lb (124.3 kg)   SpO2 96%   BMI 35.18 kg/m  , BMI Body mass index is 35.18 kg/m. GENERAL:  Well appearing NECK:  No jugular venous distention, waveform within normal limits, carotid upstroke brisk and symmetric, no bruits, no thyromegaly LUNGS:  Clear to auscultation bilaterally CHEST:  Unremarkable HEART:  PMI not displaced or sustained,S1 and S2 within normal limits, no S3, no S4, no clicks, no rubs, no murmurs ABD:  Flat, positive bowel sounds normal in frequency in pitch, no bruits, no rebound, no guarding, no midline pulsatile mass, no hepatomegaly, no splenomegaly EXT:  2 plus pulses throughout, no edema, no cyanosis no clubbing   EKG:  EKG is   ordered today. The ekg ordered today demonstrates normal sinus rhythm, rate 68, axis within normal limits, intervals with mildly increased QT, no acute ST-T wave changes.   Recent Labs: 02/09/2020: TSH 3.04 08/04/2020: ALT 23 11/15/2020: BUN 22; Creatinine, Ser 0.92; Hemoglobin 15.0; Platelets 166; Potassium 4.0; Sodium 140    Lipid Panel    Component Value Date/Time   CHOL 146 08/04/2020 0953   TRIG 119.0 08/04/2020 0953   TRIG 231 (HH) 12/27/2005 1005   HDL 38.10 (L) 08/04/2020 0953   CHOLHDL 4 08/04/2020 0953   VLDL 23.8 08/04/2020 0953   LDLCALC 84 08/04/2020 0953   LDLDIRECT 70.0 07/07/2018 0833      Wt Readings from Last 3 Encounters:  01/29/21 274 lb (124.3 kg)  12/15/20 274 lb 2 oz (124.3 kg)  11/28/20 272 lb 4.3 oz (123.5 kg)      Other studies Reviewed: Additional studies/ records that were reviewed today include: Labs Review of the above records demonstrates: See elsewhere   ASSESSMENT AND PLAN:  Frequent PVCs:    He is tolerating the Multaq.    I will check routine blood work today.  He continues his Multaq.  I will check  electrolytes today.  OSA:   He has been referred for hypoglossal nerve stimulator.  CHEST PAIN:   He had a negative perfusion study late last year.  No further work-up is suggested.  FATIGUE:     He has had a work up of this and there is no clear etiology.  I suspect this is related to his  sleep apnea.  SOB: I will check a BNP level.  However, not strongly suspecting a cardiac etiology.  I will defer consideration of any further pulmonary work-up to Dr. [name].   Interestingly he did drop his oxygen sat walking around although he did get short of breath.  He did drop his heart rate which is probably increased ectopy.  I would like to look at this with a POET (Plain Old Exercise Treadmill)    Current medicines are reviewed at length with the patient today.  The patient does not have concerns regarding medicines.  The following changes have been made:  None  Labs/ tests ordered today include:   Orders Placed This Encounter  Procedures   Brain natriuretic peptide   Basic metabolic panel   CBC   Magnesium   EXERCISE TOLERANCE TEST (ETT)   EKG 12-Lead      Disposition:   FU with APP in six months.    Signed, Minus Breeding, MD  01/29/2021 11:30 AM    [facility]

## 2021-01-29 NOTE — Patient Instructions (Addendum)
Medication Instructions:  The current medical regimen is effective;  continue present plan and medications.  *If you need a refill on your cardiac medications before your next appointment, please call your pharmacy*   Lab Work: BNP, CMET, CBC, MAG today  If you have labs (blood work) drawn today and your tests are completely normal, you will receive your results only by: [place] (if you have MyChart) OR A paper copy in the mail If you have any lab test that is abnormal or we need to change your treatment, we will call you to review the results.  Testing: Your physician has requested that you have an exercise tolerance test, this is a screening tool to track your fitness level. This test evaluates the your exercise capacity by measuring cardiovascular response to exercise, the stress response is induced by exercise (exercise-treadmill).  Graded exercise test is also known as maximal exercise test or stress EKG test  . Please also follow instruction sheet given.    Follow-Up: At [facility], you and your health needs are our priority.  As part of our continuing mission to provide you with exceptional heart care, we have created designated Provider Care Teams.  These Care Teams include your primary Cardiologist (physician) and Advanced Practice Providers (APPs -  Physician Assistants and Nurse Practitioners) who all work together to provide you with the care you need, when you need it.  We recommend signing up for the patient portal called "MyChart".  Sign up information is provided on this After Visit Summary.  MyChart is used to connect with patients for Virtual Visits (Telemedicine).  Patients are able to view lab/test results, encounter notes, upcoming appointments, etc.  Non-urgent messages can be sent to your provider as well.   To learn more about what you can do with MyChart, go to [email or web address].    Your next appointment:   6 month(s)  The format for your next  appointment:   In Person  Provider:   Coletta Memos, FNP, Fabian Sharp, PA-C, Sande Rives, PA-C, Caron Presume, PA-C, Jory Sims, DNP, ANP, or Almyra Deforest, PA-C

## 2021-01-29 NOTE — Addendum Note (Signed)
Addended by: Caprice Beaver T on: 01/29/2021 12:52 PM   Modules accepted: Orders

## 2021-01-30 ENCOUNTER — Telehealth (HOSPITAL_COMMUNITY): Payer: Self-pay | Admitting: *Deleted

## 2021-01-30 ENCOUNTER — Ambulatory Visit (HOSPITAL_COMMUNITY)
Admission: RE | Admit: 2021-01-30 | Discharge: 2021-01-30 | Disposition: A | Discharge status: Home / Self Care | Payer: Medicare HMO | Source: Ambulatory Visit | Attending: Internal Medicine | Admitting: Internal Medicine

## 2021-01-30 DIAGNOSIS — R0602 Shortness of breath: Secondary | ICD-10-CM | POA: Insufficient documentation

## 2021-01-30 LAB — EXERCISE TOLERANCE TEST
Angina Index: 0
Duke Treadmill Score: 6
Estimated workload: 7
Exercise duration (min): 6 min
Exercise duration (sec): 0 s
MPHR: 165 {beats}/min
Peak HR: 129 {beats}/min
Percent HR: 78 %
Rest HR: 66 {beats}/min
ST Depression (mm): 0 mm

## 2021-01-30 LAB — BASIC METABOLIC PANEL
BUN/Creatinine Ratio: 21 — ABNORMAL HIGH (ref 9–20)
BUN: 18 mg/dL (ref 6–24)
CO2: 22 mmol/L (ref 20–29)
Calcium: 9.5 mg/dL (ref 8.7–10.2)
Chloride: 108 mmol/L — ABNORMAL HIGH (ref 96–106)
Creatinine, Ser: 0.86 mg/dL (ref 0.76–1.27)
Glucose: 98 mg/dL (ref 70–99)
Potassium: 4.6 mmol/L (ref 3.5–5.2)
Sodium: 142 mmol/L (ref 134–144)
eGFR: 102 mL/min/{1.73_m2} (ref >59)

## 2021-01-30 LAB — BRAIN NATRIURETIC PEPTIDE: BNP: 39.2 pg/mL (ref 0.0–100.0)

## 2021-01-30 LAB — CBC
Hematocrit: 43.5 % (ref 37.5–51.0)
Hemoglobin: 15 g/dL (ref 13.0–17.7)
MCH: 32.1 pg (ref 26.6–33.0)
MCHC: 34.5 g/dL (ref 31.5–35.7)
MCV: 93 fL (ref 79–97)
Platelets: 167 10*3/uL (ref 150–450)
RBC: 4.68 x10E6/uL (ref 4.14–5.80)
RDW: 12.5 % (ref 11.6–15.4)
WBC: 6.8 10*3/uL (ref 3.4–10.8)

## 2021-01-30 LAB — MAGNESIUM: Magnesium: 2 mg/dL (ref 1.6–2.3)

## 2021-01-30 NOTE — Addendum Note (Signed)
Addended by: Minus Breeding on: 01/30/2021 03:19 PM   Modules accepted: Orders

## 2021-01-30 NOTE — Telephone Encounter (Signed)
Close encounter 

## 2021-01-31 ENCOUNTER — Telehealth: Payer: Self-pay | Admitting: Cardiology

## 2021-01-31 IMAGING — CR CHEST - 2 VIEW
2 series · 2 of 2 positions shown · non-contrast
Comparison: 02/21/2016

CLINICAL DATA: Chest heaviness starting this morning. Confusion.
Former smoker.

EXAM:
CHEST - 2 VIEW

[w chest pa]
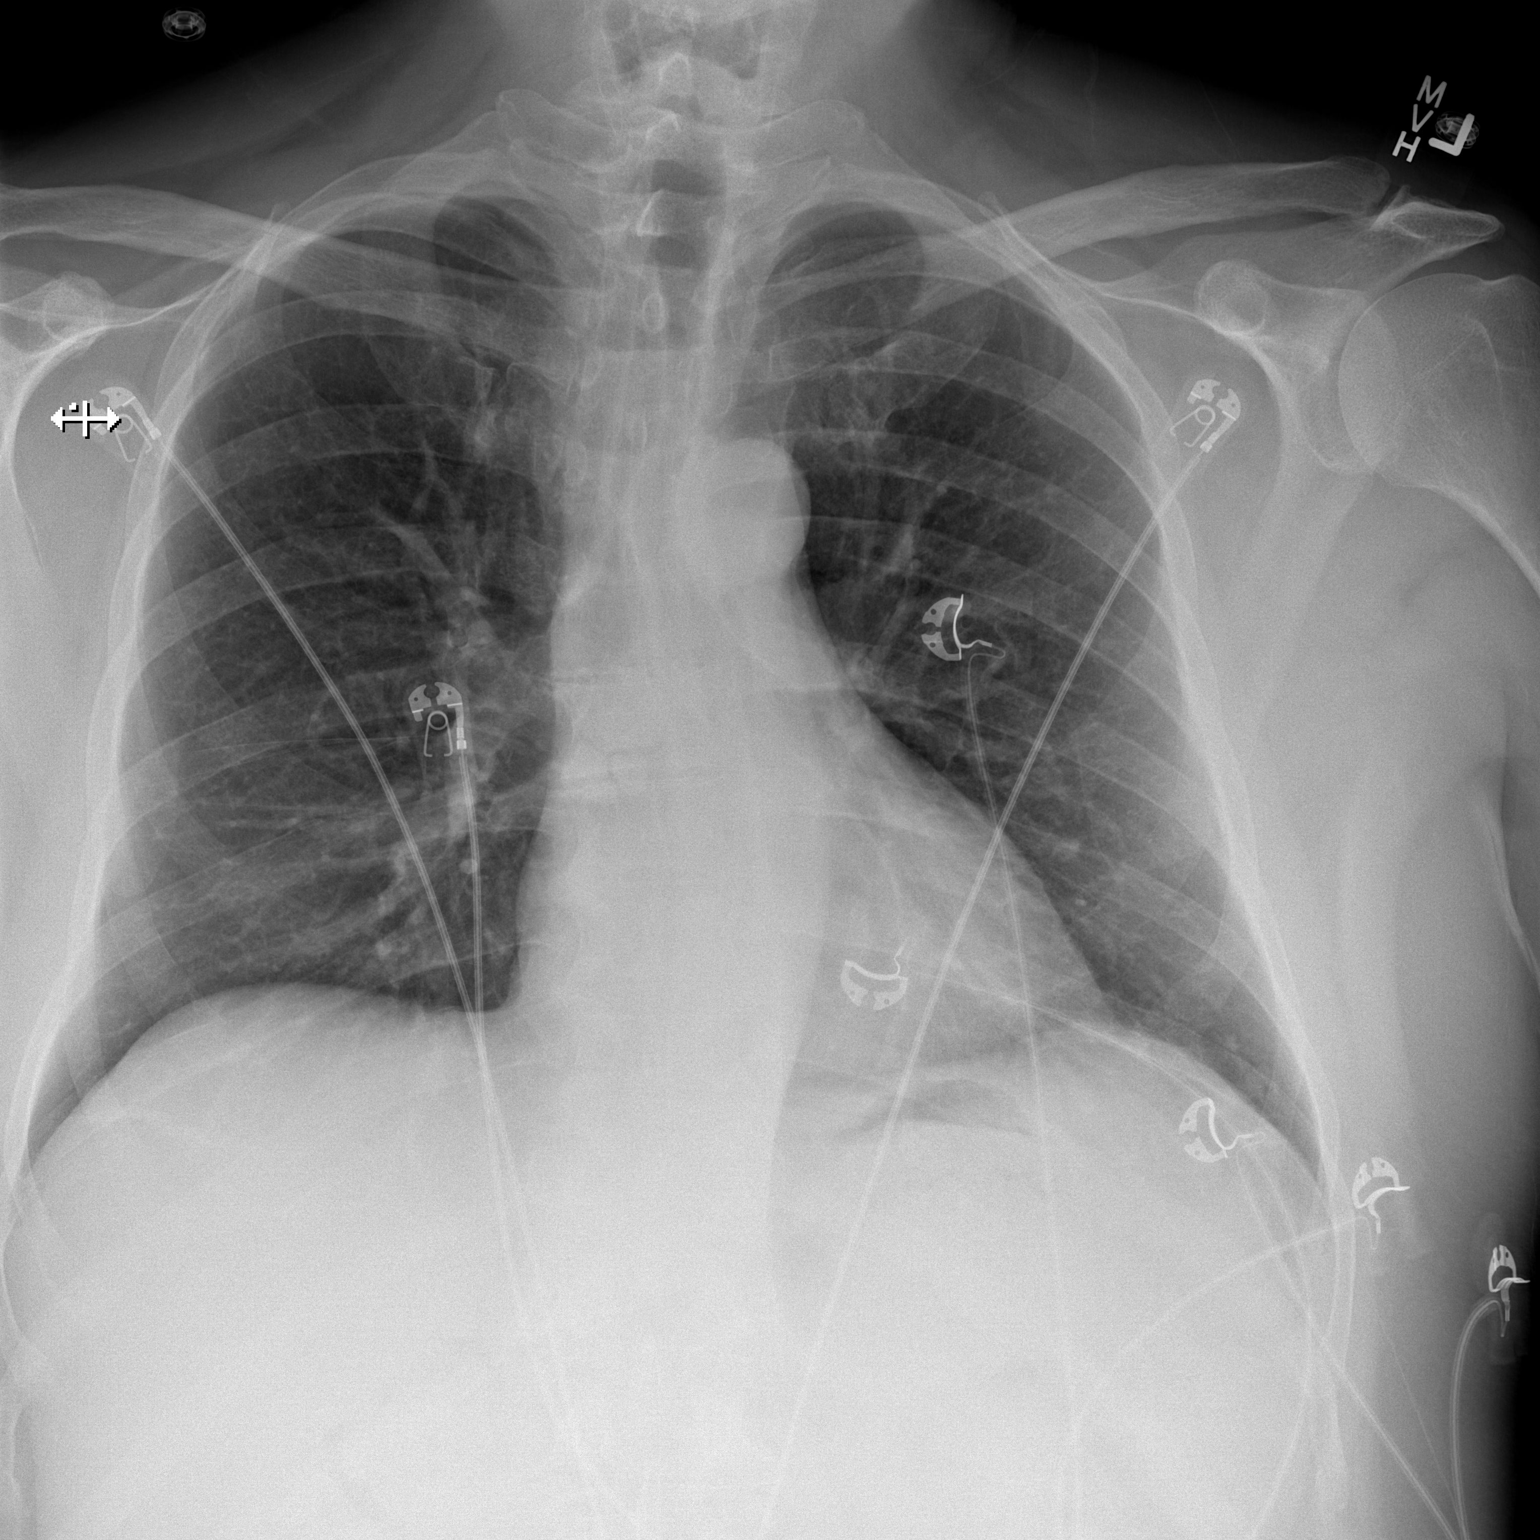

[w chest lat]
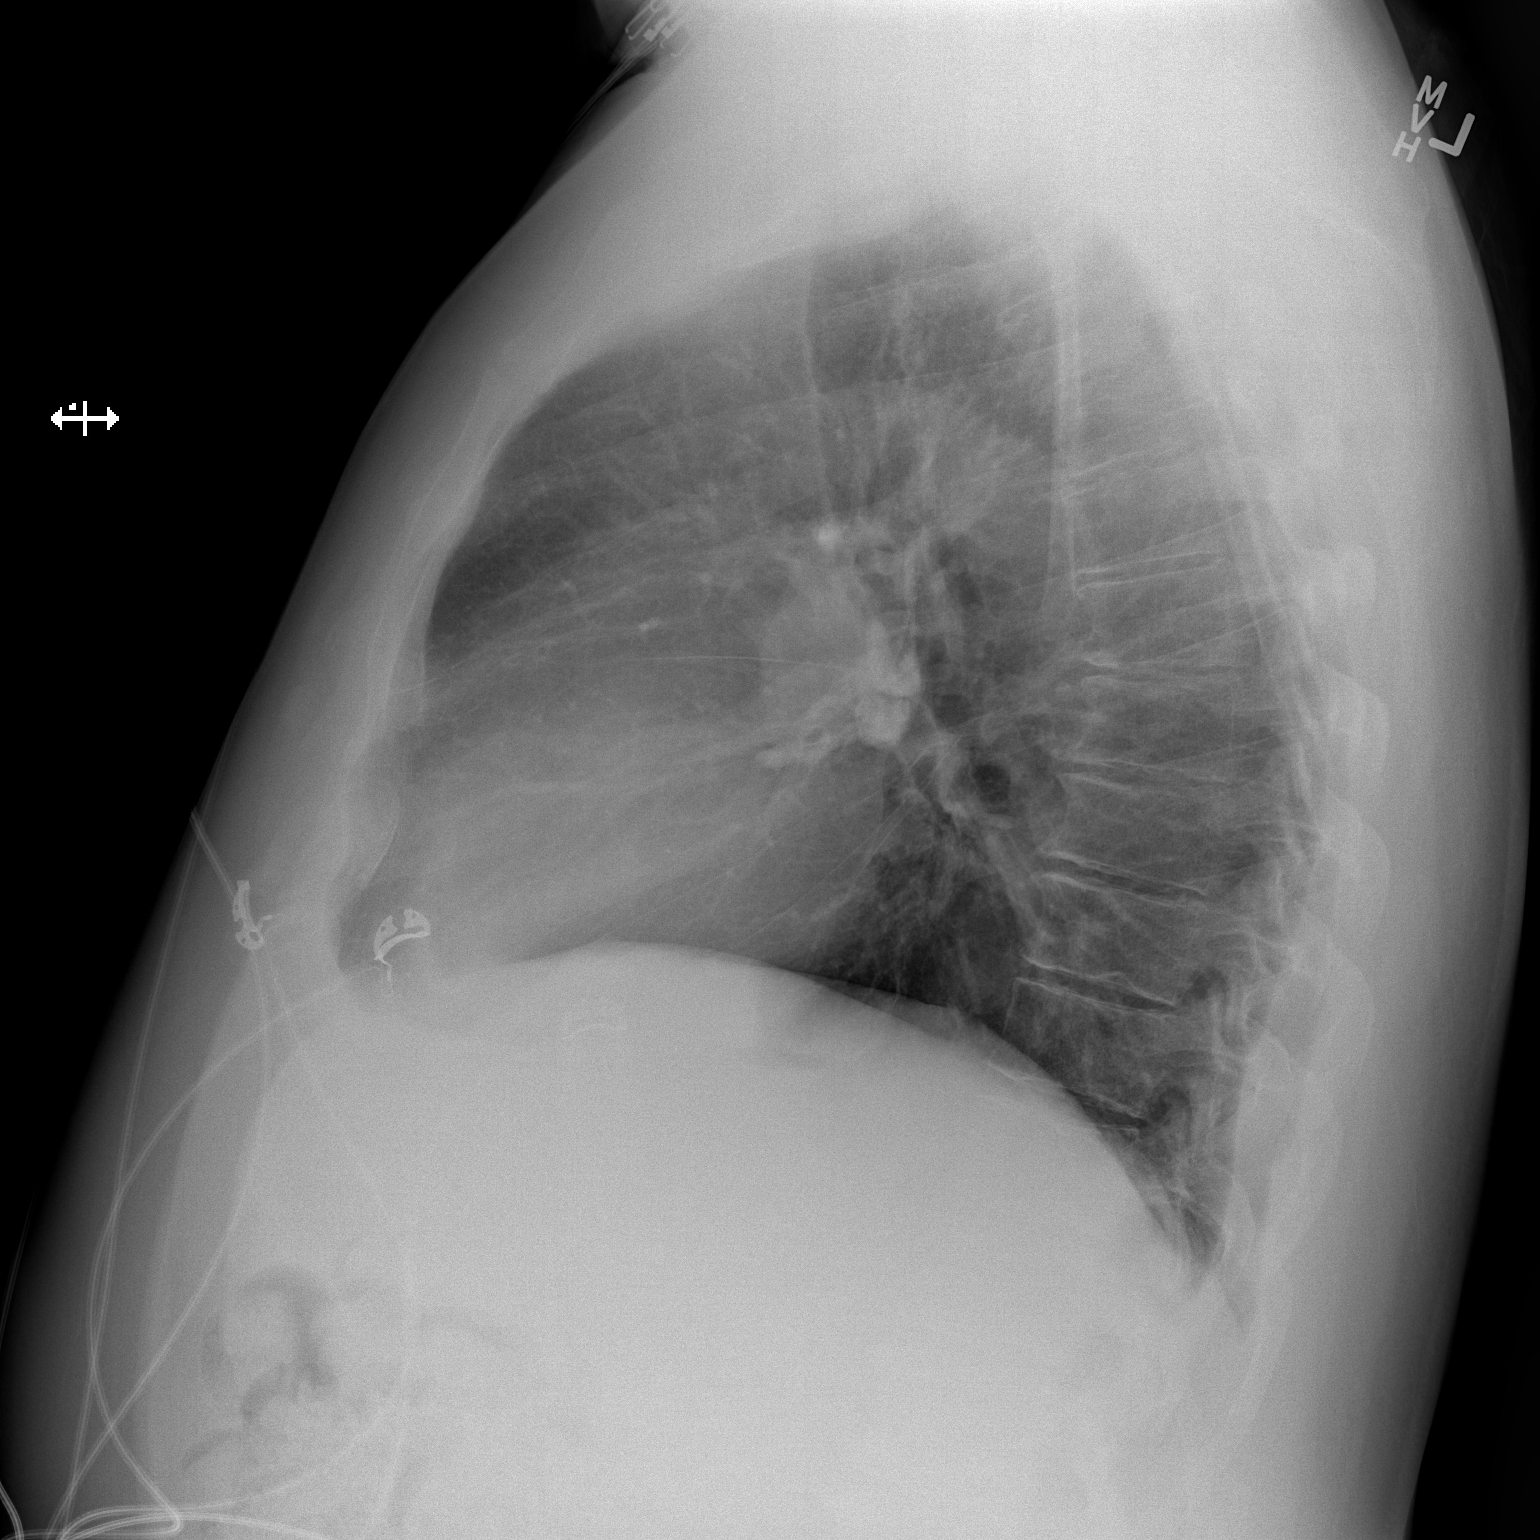

[2 of 2 positions shown; findings below may reference images not displayed]

FINDINGS: The heart size and mediastinal contours are within normal limits.
Both lungs are clear. The visualized skeletal structures are
unremarkable.
IMPRESSION: No active cardiopulmonary disease.

## 2021-01-31 NOTE — Telephone Encounter (Signed)
Spoke with patient and told him lab work was okay and that stress results are not available yet.

## 2021-01-31 NOTE — Telephone Encounter (Signed)
Patient calling for lab and stress test results.

## 2021-02-01 ENCOUNTER — Encounter: Payer: Self-pay | Admitting: *Deleted

## 2021-02-02 ENCOUNTER — Ambulatory Visit: Payer: Self-pay | Admitting: Licensed Clinical Social Worker

## 2021-02-02 ENCOUNTER — Telehealth: Payer: Self-pay | Admitting: Cardiology

## 2021-02-02 DIAGNOSIS — M1712 Unilateral primary osteoarthritis, left knee: Secondary | ICD-10-CM

## 2021-02-02 DIAGNOSIS — F21 Schizotypal disorder: Secondary | ICD-10-CM

## 2021-02-02 DIAGNOSIS — R569 Unspecified convulsions: Secondary | ICD-10-CM

## 2021-02-02 DIAGNOSIS — M47817 Spondylosis without myelopathy or radiculopathy, lumbosacral region: Secondary | ICD-10-CM

## 2021-02-02 NOTE — Telephone Encounter (Signed)
Follow Up:     Patient is calling to see if his Stress Test results is ready?

## 2021-02-02 NOTE — Telephone Encounter (Signed)
Pt informed of providers result & recommendations. Pt verbalized understanding. No further questions .  Forwarded to PCP via [facility] fax function.

## 2021-02-02 NOTE — Telephone Encounter (Signed)
Called pt back in regards to results from stress test. Pt is upset that he was not allowed to finish due to his hands shaking. Pt states that he was very much willing to complete the test but was told that he could not per tech administering study. Pt states that his goal is to get back to good condition that he was in pre- pandemic. Pt states that he was just upset. Pt verbalizes understanding.

## 2021-02-02 NOTE — Telephone Encounter (Signed)
Steven Vincent is calling back about his stress test stating the tech is the one that said he could not finish it due to his hands shaking. He is offended by the words said about his results when he is not the one that did not want to finish completing it. 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 He stated several times he will retake the test until he's falling off of the machine to prove he's capable, but he will not redo it laying down. He feels it is wrong he was told the results were "poor" due to it being uncompleted.

## 2021-02-06 ENCOUNTER — Other Ambulatory Visit: Payer: Self-pay | Admitting: Otolaryngology

## 2021-02-06 NOTE — Chronic Care Management (AMB) (Signed)
Chronic Care Management    Clinical Social Work Note  02/06/2021 Name: Steven Vincent MRN: [id number] DOB: 1966-02-21  Betty Brooks is a 55 y.o. year old male who is a primary care patient of [place], Malka So, MD. The CCM team was consulted to assist the patient with chronic disease management and/or care coordination needs related to: [employer] .   Engaged with patient by telephone for initial visit in response to provider referral for social work chronic care management and care coordination services.   Consent to Services:  The patient was given the following information about Chronic Care Management services today, agreed to services, and gave verbal consent: 1. CCM service includes personalized support from designated clinical staff supervised by the primary care provider, including individualized plan of care and coordination with other care providers 2. 24/7 contact phone numbers for assistance for urgent and routine care needs. 3. Service will only be billed when office clinical staff spend 20 minutes or more in a month to coordinate care. 4. Only one practitioner may furnish and bill the service in a calendar month. 5.The patient may stop CCM services at any time (effective at the end of the month) by phone call to the office staff. 6. The patient will be responsible for cost sharing (co-pay) of up to 20% of the service fee (after annual deductible is met). Patient agreed to services and consent obtained.  Patient agreed to services and consent obtained.   Consent to Services:  The patient was given information about Care Management services, agreed to services, and gave verbal consent prior to initiation of services.  Please see initial visit note for detailed documentation.   Patient agreed to services today and consent obtained.  Engaged with patient by phone in response to provider referral for social work care coordination services:  Assessment/Interventions:  Assessed patient's previous and current treatment, coping skills, support system and barriers to care. Patient is interested in assistance with obtaining safe and affordable housing. CCM LCSW provided validation and encouragement. Resources provided and Care Guide referral completed to assist with additional housing resources.  See Care Plan below for interventions and patient self-care activities.  Recent life changes or stressors: Housing   Recommendation: Patient may benefit from, and is in agreement work with LCSW to address care coordination needs and will continue to work with the clinical team to address health care and disease management related needs.   Follow up Plan: Patient would like continued follow-up from CCM LCSW.  per patient's request will follow up in 2 weeks.  Will call office if needed prior to next encounter.  SDOH (Social Determinants of Health) assessments and interventions performed:    Advanced Directives Status: Not addressed in this encounter.  CCM Care Plan  Allergies  Allergen Reactions   Delsym [Dextromethorphan] Other (See Comments)    Gets loopy/memory loss   Aspirin     GI upset Other reaction(s): Vomiting   Mucinex D [Pseudoephedrine-Guaifenesin Er] Hypertension   Ibuprofen Other (See Comments)    Stomach irritation    Outpatient Encounter Medications as of 02/02/2021  Medication Sig   aspirin EC 81 MG tablet Take 81 mg by mouth daily. Swallow whole.   diclofenac (FLECTOR) 1.3 % PTCH Place 1 patch onto the skin daily as needed for pain.   Erenumab-aooe (AIMOVIG) 140 MG/ML SOAJ Inject 140 mg into the skin every 30 (thirty) days.   meloxicam (MOBIC) 15 MG tablet    MULTAQ 400 MG tablet TAKE 1  TABLET TWICE DAILY WITH MEALS   pravastatin (PRAVACHOL) 20 MG tablet Take 1 tablet (20 mg total) by mouth daily.   rOPINIRole (REQUIP) 0.5 MG tablet TAKE 1 TABLET AT BEDTIME   SUMAtriptan 6 MG/0.5ML SOAJ INJECT 6 MG INTO THE SKIN DAILY AS NEEDED AT MIGRAINE  ONSET. MAY REPEAT 1 TIME AFTER 1 HOUR IF NEEDED   tiZANidine (ZANAFLEX) 2 MG tablet Take 1 tablet (2 mg total) by mouth every 6 (six) hours as needed for muscle spasms.   topiramate (TOPAMAX) 50 MG tablet Take one tablet in the AM and two tablets in the PM.   traMADol (ULTRAM) 50 MG tablet Take 1 tablet (50 mg total) by mouth every 6 (six) hours as needed for moderate pain or severe pain (post op pain).   No facility-administered encounter medications on file as of 02/02/2021.    Patient Active Problem List   Diagnosis Date Noted   Primary osteoarthritis of left knee 11/28/2020   Educated about COVID-19 virus infection 07/22/2019   Generalized osteoarthritis of multiple sites 04/19/2019   Fatigue 09/21/2018   ED (erectile dysfunction) 07/06/2018   Allergic rhinitis 03/09/2018   Insomnia 03/06/2018   DJD (degenerative joint disease), lumbosacral 06/19/2017   Bradycardia 04/14/2017   OSA (obstructive sleep apnea) 04/10/2017   Syncope 01/02/2017   PVC (premature ventricular contraction) 01/02/2017   Class 2 obesity with body mass index (BMI) of 35.0 to 35.9 in adult 11/13/2016   Chest pain, unspecified 10/16/2016   Hyperlipidemia 10/16/2016   Radicular pain of left lower extremity 01/23/2016   RLS (restless legs syndrome) 01/23/2016   Hemiballismus 01/17/2015   Migraine without aura 01/19/2013   Migraine 08/03/2010   Hip pain, left 04/26/2010   Hemorrhoids, internal, with bleeding 04/26/2010   Daytime hypersomnolence 11/14/2009   REACTIVE HYPOGLYCEMIA 04/13/2008   WEIGHT GAIN 04/13/2008   SCHIZOTYPAL PERSONALITY DISORDER 07/07/2007   Low back pain with radiation 11/06/2006   Seizures ([place]) 11/06/2006   Headache, unspecified headache type 11/06/2006    Conditions to be addressed/monitored: HLD, Osteoarthritis, and Schizotypal Personality Disorder ; Housing barriers  Care Plan : LCSW Plan of Care  Updates made by Rebekah Chesterfield, LCSW since 02/06/2021 12:00 AM     Problem:  Barriers to Treatment      Goal: Barriers to Treatment   Start Date: 02/02/2021  This Visit's Progress: On track  Priority: High  Note:   Current barriers:    Housing barriers and Mental Health Concerns  Clinical Goals: Patient will work with CCM LCSW to address needs related to obtaining independent and safe housing Clinical Interventions:  Assessment of needs, barriers , agencies contacted, as well as how impacting  Patient is interested in housing for seniors 55+. He has worked with [employer] and [employer] with [employer]. Patient endorses frustration that he has not obtained assistance, despite working with various agencies Patient reports limited support system, stating "I have no family or friends. I have nothing" Patient is grieving the loss of service animal Patient participates in therapy, next appt scheduled for Jan 2023   Active listening / Reflection utilized  Emotional Support Provided Provided psychoeducation for mental health needs  Verbalization of feelings encouraged  Made referral to Care Guide for local housing resources Review various resources, discussed options and provided patient information about  Housing resources  1:1 collaboration with primary care provider regarding development and update of comprehensive plan of care as evidenced by provider attestation and co-signature Inter-disciplinary care team collaboration (see longitudinal plan  of care) Patient Goals/Self-Care Activities: Over the next 120 days Contact PCP office with any questions or concerns Attend scheduled appointments Continue with therapy Utilize resources discussed        Christa See, MSW, LCSW Financial controller at [email or web address] Phone [phone] 9:36 AM

## 2021-02-06 NOTE — Patient Instructions (Signed)
Visit Information  Thank you for taking time to visit with me today. Please don't hesitate to contact me if I can be of assistance to you before our next scheduled telephone appointment.  Following are the goals we discussed today:  Patient Goals/Self-Care Activities: Over the next 120 days Contact PCP office with any questions or concerns Attend scheduled appointments Continue with therapy Utilize resources discussed  Please call the care guide team at [phone] if you need to cancel or reschedule your appointment.   If you are experiencing a Mental Health or [place] or need someone to talk to, please call the Suicide and Crisis Lifeline: 988 call 911   Following is a copy of your full plan of care:  Care Plan : LCSW Plan of Care  Updates made by Rebekah Chesterfield, LCSW since 02/06/2021 12:00 AM     Problem: Barriers to Treatment      Goal: Barriers to Treatment   Start Date: 02/02/2021  This Visit's Progress: On track  Priority: High  Note:   Current barriers:    Housing barriers and Mental Health Concerns  Clinical Goals: Patient will work with CCM LCSW to address needs related to obtaining independent and safe housing Clinical Interventions:  Assessment of needs, barriers , agencies contacted, as well as how impacting  Patient is interested in housing for seniors 55+. He has worked with [employer] and [employer] with [employer]. Patient endorses frustration that he has not obtained assistance, despite working with various agencies Patient reports limited support system, stating "I have no family or friends. I have nothing" Patient is grieving the loss of service animal Patient participates in therapy, next appt scheduled for Jan 2023   Active listening / Reflection utilized  Emotional Support Provided Provided psychoeducation for mental health needs  Verbalization of feelings encouraged  Made referral to Care Guide for local housing resources Review various  resources, discussed options and provided patient information about  Housing resources  1:1 collaboration with primary care provider regarding development and update of comprehensive plan of care as evidenced by provider attestation and co-signature Inter-disciplinary care team collaboration (see longitudinal plan of care) Patient Goals/Self-Care Activities: Over the next 120 days Contact PCP office with any questions or concerns Attend scheduled appointments Continue with therapy Utilize resources discussed       Steven Vincent was given information about Care Management services by the embedded care coordination team including:  Care Management services include personalized support from designated clinical staff supervised by his physician, including individualized plan of care and coordination with other care providers 24/7 contact phone numbers for assistance for urgent and routine care needs. The patient may stop CCM services at any time (effective at the end of the month) by phone call to the office staff.  Patient agreed to services and verbal consent obtained.   Patient verbalizes understanding of instructions provided today and agrees to view in [place].   Telephone follow up appointment with care management team member scheduled for: 2 weeks from 02/02/21  Christa See, MSW, LCSW [place] at [email or web address] Phone [phone] 9:40 AM

## 2021-02-15 ENCOUNTER — Ambulatory Visit: Payer: Medicare HMO | Admitting: Cardiology

## 2021-02-23 ENCOUNTER — Encounter (HOSPITAL_BASED_OUTPATIENT_CLINIC_OR_DEPARTMENT_OTHER): Payer: Self-pay | Admitting: Otolaryngology

## 2021-03-01 ENCOUNTER — Ambulatory Visit: Payer: Medicare HMO | Admitting: Cardiology

## 2021-03-02 ENCOUNTER — Telehealth: Payer: Self-pay | Admitting: Family Medicine

## 2021-03-02 NOTE — Telephone Encounter (Signed)
Patient called because he is having congestion and drainage with mucus that he is coughing up. Patient wants to know if there is anything that Dr.Jordan can call in to help him through the weekend as Tylenol Sinus is no longer working . Patient does have appointment scheduled for Monday at 7:30     Please advise

## 2021-03-02 NOTE — Telephone Encounter (Signed)
Has appt on Monday

## 2021-03-05 ENCOUNTER — Encounter: Payer: Self-pay | Admitting: Family Medicine

## 2021-03-05 ENCOUNTER — Telehealth (INDEPENDENT_AMBULATORY_CARE_PROVIDER_SITE_OTHER): Payer: Medicare HMO | Admitting: Family Medicine

## 2021-03-05 VITALS — Ht 74.0 in

## 2021-03-05 DIAGNOSIS — R059 Cough, unspecified: Secondary | ICD-10-CM

## 2021-03-05 DIAGNOSIS — J309 Allergic rhinitis, unspecified: Secondary | ICD-10-CM

## 2021-03-05 MED ORDER — FLUTICASONE PROPIONATE 50 MCG/ACT NA SUSP: 1.0000 | Freq: Two times a day (BID) | NASAL | 3 refills | Status: DC

## 2021-03-05 MED ORDER — BENZONATATE 100 MG PO CAPS: 200.0000 mg | ORAL_CAPSULE | Freq: Two times a day (BID) | ORAL | 0 refills | Status: AC | PRN

## 2021-03-05 MED ORDER — GUAIFENESIN 200 MG PO TABS: 200.0000 mg | ORAL_TABLET | ORAL | 0 refills | Status: DC | PRN

## 2021-03-05 NOTE — Progress Notes (Signed)
Virtual Visit via Telephone Note I connected with Steven Vincent on 03/05/21 at  7:30 AM EST by telephone and verified that I am speaking with the correct person using two identifiers.   I discussed the limitations, risks, security and privacy concerns of performing an evaluation and management service by telephone and the availability of in person appointments. I also discussed with the patient that there may be a patient responsible charge related to this service. The patient expressed understanding and agreed to proceed.  Location patient: home Location provider: work or office Participants present for the call: patient, provider Patient did not have a visit in the prior 7 days to address this/these issue(s).  Chief Complaint  Patient presents with   Cough    Drainage down into throat, coughing up mucus.   Nasal Congestion   History of Present Illness: Steven Vincent is a 56 y.o.male with hx of migraine headaches, chronic back pain, hyperlipidemia, OSA, and schizotypal personality disorder complaining of 10 days of nasal congestion, rhinorrhea, postnasal drainage, and productive cough. Clearly sputum, denies hemoptysis. Cough interferes with his sleep. 2 days ago chest wall pain with coughing spells.  Negative for fever, chills, body aches, unusual fatigue, headache, sore throat, ageusia, anosmia, wheezing, dyspnea, abdominal pain, nausea, vomiting, changes in bowel habits, or skin rash. Symptoms are stable.  He has tried Tylenol sinus. Negative for sick contacts or recent travel.  States that he is planning on getting Inspire device placement for OSA.  Observations/Objective: Patient sounds cheerful and well on the phone. I do not appreciate any SOB. Speech and thought processing are grossly intact. Patient reported vitals:Ht 6\' 2"  (1.88 m)    BMI 34.67 kg/m   Assessment and Plan:  1. Allergic rhinitis, unspecified seasonality, unspecified trigger History does not  suggest an infectious process. Recommend nasal saline irrigations as needed. Flonase nasal spray daily as needed. Follow-up as needed.  - fluticasone (FLONASE) 50 MCG/ACT nasal spray; Place 1 spray into both nostrils 2 (two) times daily.  Dispense: 16 g; Refill: 3  2. Cough, unspecified type Plain Mucinex may help with mucus viscosity and therefore with cough. Symptomatic treatment with benzonatate, mainly at bedtime. I do not think imaging is needed at this time, offered chest x-ray here in the office, he prefers to wait. Monitor for new symptoms. Clearly instructed about warning signs.  - guaiFENesin 200 MG tablet; Take 1 tablet (200 mg total) by mouth every 4 (four) hours as needed for cough or to loosen phlegm.  Dispense: 30 suppository; Refill: 0 - benzonatate (TESSALON) 100 MG capsule; Take 2 capsules (200 mg total) by mouth 2 (two) times daily as needed for up to 10 days.  Dispense: 30 capsule; Refill: 0  Follow Up Instructions:  Return if symptoms worsen or fail to improve.  I did not refer this patient for an OV in the next 24 hours for this/these issue(s).  I discussed the assessment and treatment plan with the patient. The patient was provided an opportunity to ask questions and all were answered. The patient agreed with the plan and demonstrated an understanding of the instructions.   The patient was advised to call back or seek an in-person evaluation if the symptoms worsen or if the condition fails to improve as anticipated.  I provided  12 minutes of non-face-to-face time during this encounter.  Maribel Luis G. Martinique, MD  [facility]. [place] office.

## 2021-03-06 ENCOUNTER — Telehealth: Payer: Self-pay

## 2021-03-06 NOTE — Telephone Encounter (Signed)
° °  Telephone encounter was:  Unsuccessful.  03/06/2021 Name: Steven Vincent MRN: [id number] DOB: 03-Apr-1965  Unsuccessful outbound call made today to assist with: Housing   Outreach Attempt:  1st Attempt  A HIPAA compliant voice message was left requesting a return call.  Instructed patient to call back at [phone] at their earliest convenience.  [facility] management  [place], [place]  Main Phone: [phone]   E-mail: [name]  Website: [email or web address]

## 2021-03-06 NOTE — Anesthesia Preprocedure Evaluation (Addendum)
Anesthesia Evaluation  Patient identified by MRN, date of birth, ID band Patient awake    Reviewed: Allergy & Precautions, NPO status , Patient's Chart, lab work & pertinent test results  History of Anesthesia Complications Negative for: history of anesthetic complications  Airway Mallampati: IV  TM Distance: >3 FB Neck ROM: Full    Dental  (+) Missing,    Pulmonary sleep apnea , former smoker,    Pulmonary exam normal        Cardiovascular negative cardio ROS Normal cardiovascular exam     Neuro/Psych  Headaches, Seizures - (last 20 years ago),  Schizotypal personality disorder    GI/Hepatic negative GI ROS, Neg liver ROS,   Endo/Other  negative endocrine ROS  Renal/GU negative Renal ROS  negative genitourinary   Musculoskeletal  (+) Arthritis ,   Abdominal   Peds  Hematology negative hematology ROS (+)   Anesthesia Other Findings Day of surgery medications reviewed with patient.  Reproductive/Obstetrics negative OB ROS                            Anesthesia Physical Anesthesia Plan  ASA: 3  Anesthesia Plan: MAC   Post-op Pain Management: Minimal or no pain anticipated   Induction:   PONV Risk Score and Plan: 1 and Propofol infusion and Treatment may vary due to age or medical condition  Airway Management Planned: Natural Airway  Additional Equipment: None  Intra-op Plan:   Post-operative Plan:   Informed Consent: I have reviewed the patients History and Physical, chart, labs and discussed the procedure including the risks, benefits and alternatives for the proposed anesthesia with the patient or authorized representative who has indicated his/her understanding and acceptance.       Plan Discussed with: CRNA  Anesthesia Plan Comments:        Anesthesia Quick Evaluation

## 2021-03-07 ENCOUNTER — Ambulatory Visit (HOSPITAL_COMMUNITY)
Admission: RE | Admit: 2021-03-07 | Discharge: 2021-03-07 | Disposition: A | Discharge status: Home / Self Care | Payer: Medicare HMO | Attending: Otolaryngology | Admitting: Otolaryngology

## 2021-03-07 ENCOUNTER — Other Ambulatory Visit: Payer: Self-pay

## 2021-03-07 ENCOUNTER — Encounter (HOSPITAL_BASED_OUTPATIENT_CLINIC_OR_DEPARTMENT_OTHER): Payer: Self-pay | Admitting: Otolaryngology

## 2021-03-07 ENCOUNTER — Ambulatory Visit (HOSPITAL_BASED_OUTPATIENT_CLINIC_OR_DEPARTMENT_OTHER): Payer: Medicare HMO | Admitting: Certified Registered"

## 2021-03-07 ENCOUNTER — Encounter (HOSPITAL_BASED_OUTPATIENT_CLINIC_OR_DEPARTMENT_OTHER)
Admission: RE | Disposition: A | Discharge status: Home / Self Care | Payer: Self-pay | Source: Home / Self Care | Attending: Otolaryngology

## 2021-03-07 DIAGNOSIS — G4733 Obstructive sleep apnea (adult) (pediatric): Secondary | ICD-10-CM | POA: Insufficient documentation

## 2021-03-07 DIAGNOSIS — M1990 Unspecified osteoarthritis, unspecified site: Secondary | ICD-10-CM | POA: Diagnosis not present

## 2021-03-07 DIAGNOSIS — Z87891 Personal history of nicotine dependence: Secondary | ICD-10-CM | POA: Diagnosis not present

## 2021-03-07 HISTORY — PX: DRUG INDUCED ENDOSCOPY: SHX6808

## 2021-03-07 SURGERY — DRUG INDUCED SLEEP ENDOSCOPY
Anesthesia: Monitor Anesthesia Care | Site: Nose

## 2021-03-07 MED ORDER — EPINEPHRINE PF 1 MG/ML IJ SOLN
INTRAMUSCULAR | Status: AC
  Filled 2021-03-07: qty 2

## 2021-03-07 MED ORDER — LACTATED RINGERS IV SOLN: INTRAVENOUS | Status: DC

## 2021-03-07 MED ORDER — OXYMETAZOLINE HCL 0.05 % NA SOLN
NASAL | Status: DC | PRN
  Administered 2021-03-07: 1 via TOPICAL

## 2021-03-07 MED ORDER — PROPOFOL 500 MG/50ML IV EMUL
INTRAVENOUS | Status: DC | PRN
  Administered 2021-03-07: 35 ug/kg/min via INTRAVENOUS

## 2021-03-07 MED ORDER — OXYMETAZOLINE HCL 0.05 % NA SOLN
NASAL | Status: AC
  Filled 2021-03-07: qty 30

## 2021-03-07 MED ORDER — LIDOCAINE 2% (20 MG/ML) 5 ML SYRINGE
INTRAMUSCULAR | Status: DC | PRN
  Administered 2021-03-07: 60 mg via INTRAVENOUS

## 2021-03-07 SURGICAL SUPPLY — 13 items
CANISTER SUCT 1200ML W/VALVE (MISCELLANEOUS) ×1 IMPLANT
GLOVE SURG ENC MOIS LTX SZ7.5 (GLOVE) ×2 IMPLANT
KIT CLEAN ENDO (MISCELLANEOUS) ×2 IMPLANT
NDL HYPO 27GX1-1/4 (NEEDLE) IMPLANT
NEEDLE HYPO 27GX1-1/4 (NEEDLE) IMPLANT
PACK BASIN DAY SURGERY FS (CUSTOM PROCEDURE TRAY) ×2 IMPLANT
PATTIES SURGICAL .5 X3 (DISPOSABLE) ×2 IMPLANT
SHEET MEDIUM DRAPE 40X70 STRL (DRAPES) IMPLANT
SOL ANTI FOG 6CC (MISCELLANEOUS) ×1 IMPLANT
SOLUTION ANTI FOG 6CC (MISCELLANEOUS) ×1
SYR CONTROL 10ML LL (SYRINGE) IMPLANT
TOWEL GREEN STERILE FF (TOWEL DISPOSABLE) ×2 IMPLANT
TUBE CONNECTING 20X1/4 (TUBING) ×2 IMPLANT

## 2021-03-07 NOTE — H&P (Addendum)
Steven Vincent is an 56 y.o. male.   Chief Complaint: Sleep apnea HPI: 56 year old male with obstructive sleep apnea unable to tolerate CPAP.  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   Dysrhythmia    bradycardia   Headache(784.0)    Knee pain    Low back pain    Migraines    OSA (obstructive sleep apnea) 04/10/2017   Personality disorder ([place])    Seizures ([place])     Past Surgical History:  Procedure Laterality Date   TOOTH EXTRACTION  12/2014   lower left   TOTAL KNEE ARTHROPLASTY Left 11/28/2020   Procedure: LEFT KNEE PATELLAFEMORL REPLACEMENT;  Surgeon: Melrose Nakayama, MD;  Location: [facility];  Service: Orthopedics;  Laterality: Left;    Family History  Problem Relation Age of Onset   Heart disease Father        defibrilator   Hyperlipidemia Father    Hypertension Father    Alzheimer's disease Father    Other Father        stents in legs   Aneurysm Mother        brain   Hypertension Brother    Stroke Brother        X2   Social History:  reports that he quit smoking about 23 years ago. His smoking use included cigarettes. He has a 1.00 pack-year smoking history. He has never used smokeless tobacco. He reports that he does not drink alcohol and does not use drugs.  Allergies:  Allergies  Allergen Reactions   Delsym [Dextromethorphan] Other (See Comments)    Gets loopy/memory loss   Aspirin     GI upset Other reaction(s): Vomiting   Mucinex D [Pseudoephedrine-Guaifenesin Er] Hypertension   Ibuprofen Other (See Comments)    Stomach irritation    Medications Prior to Admission  Medication Sig Dispense Refill   benzonatate (TESSALON) 100 MG capsule Take 2 capsules (200 mg total) by mouth 2 (two) times daily as needed for up to 10 days. 30 capsule 0   Erenumab-aooe (AIMOVIG) 140 MG/ML SOAJ Inject 140 mg into the skin every 30 (thirty) days. 3 mL 3   fluticasone (FLONASE) 50 MCG/ACT nasal spray  Place 1 spray into both nostrils 2 (two) times daily. 16 g 3   meloxicam (MOBIC) 15 MG tablet      MULTAQ 400 MG tablet TAKE 1 TABLET TWICE DAILY WITH MEALS 180 tablet 3   pravastatin (PRAVACHOL) 20 MG tablet Take 1 tablet (20 mg total) by mouth daily. 90 tablet 3   rOPINIRole (REQUIP) 0.5 MG tablet TAKE 1 TABLET AT BEDTIME 90 tablet 1   SUMAtriptan 6 MG/0.5ML SOAJ INJECT 6 MG INTO THE SKIN DAILY AS NEEDED AT MIGRAINE ONSET. MAY REPEAT 1 TIME AFTER 1 HOUR IF NEEDED 0.5 mL 0   tiZANidine (ZANAFLEX) 2 MG tablet Take 1 tablet (2 mg total) by mouth every 6 (six) hours as needed for muscle spasms. 30 tablet 1   topiramate (TOPAMAX) 50 MG tablet Take one tablet in the AM and two tablets in the PM. 270 tablet 3   traMADol (ULTRAM) 50 MG tablet Take 1 tablet (50 mg total) by mouth every 6 (six) hours as needed for moderate pain or severe pain (post op pain). 30 tablet 0   aspirin EC 81 MG tablet Take 81 mg by mouth daily. Swallow whole.     diclofenac (  FLECTOR) 1.3 % PTCH Place 1 patch onto the skin daily as needed for pain.     guaiFENesin 200 MG tablet Take 1 tablet (200 mg total) by mouth every 4 (four) hours as needed for cough or to loosen phlegm. 30 suppository 0    No results found for this or any previous visit (from the past 48 hour(s)). No results found.  Review of Systems  All other systems reviewed and are negative.  Blood pressure 125/90, pulse 71, temperature 98.3 F (36.8 C), temperature source Oral, resp. rate 17, height 6\' 2"  (1.88 m), weight 124.1 kg, SpO2 95 %. Physical Exam Constitutional:      Appearance: Normal appearance. He is normal weight.  HENT:     Head: Normocephalic and atraumatic.     Right Ear: External ear normal.     Left Ear: External ear normal.     Nose: Nose normal.     Mouth/Throat:     Mouth: Mucous membranes are moist.     Pharynx: Oropharynx is clear.  Eyes:     Extraocular Movements: Extraocular movements intact.     Conjunctiva/sclera:  Conjunctivae normal.     Pupils: Pupils are equal, round, and reactive to light.  Cardiovascular:     Rate and Rhythm: Normal rate.  Pulmonary:     Effort: Pulmonary effort is normal.  Musculoskeletal:     Cervical back: Normal range of motion.  Skin:    General: Skin is warm and dry.  Neurological:     General: No focal deficit present.     Mental Status: He is alert and oriented to person, place, and time.  Psychiatric:        Mood and Affect: Mood normal.        Behavior: Behavior normal.        Thought Content: Thought content normal.        Judgment: Judgment normal.     Assessment/Plan Obstructive sleep apnea and BMI 35.13.  To OR for sleep endoscopy.  Melida Quitter, MD 03/07/2021, 8:34 AM

## 2021-03-07 NOTE — Transfer of Care (Signed)
Immediate Anesthesia Transfer of Care Note  Patient: Steven Vincent  Procedure(s) Performed: DRUG INDUCED SLEEP ENDOSCOPY (Nose)  Patient Location: PACU  Anesthesia Type:MAC  Level of Consciousness: awake, alert , oriented and patient cooperative  Airway & Oxygen Therapy: Patient Spontanous Breathing  Post-op Assessment: Report given to RN and Post -op Vital signs reviewed and stable  Post vital signs: Reviewed and stable  Last Vitals:  Vitals Value Taken Time  BP    Temp    Pulse 67 03/07/21 0902  Resp 18 03/07/21 0902  SpO2 97 % 03/07/21 0902  Vitals shown include unvalidated device data.  Last Pain:  Vitals:   03/07/21 0715  TempSrc: Oral  PainSc: 0-No pain         Complications: No notable events documented.

## 2021-03-07 NOTE — Anesthesia Procedure Notes (Signed)
Procedure Name: MAC Date/Time: 03/07/2021 8:50 AM Performed by: Signe Colt, CRNA Pre-anesthesia Checklist: Patient identified, Emergency Drugs available, Suction available, Patient being monitored and Timeout performed Patient Re-evaluated:Patient Re-evaluated prior to induction Oxygen Delivery Method: Simple face mask

## 2021-03-07 NOTE — Anesthesia Postprocedure Evaluation (Signed)
Anesthesia Post Note  Patient: Steven Vincent  Procedure(s) Performed: DRUG INDUCED SLEEP ENDOSCOPY (Nose)     Patient location during evaluation: PACU Anesthesia Type: MAC Level of consciousness: awake and alert and oriented Pain management: pain level controlled Vital Signs Assessment: post-procedure vital signs reviewed and stable Respiratory status: spontaneous breathing, nonlabored ventilation and respiratory function stable Cardiovascular status: blood pressure returned to baseline Postop Assessment: no apparent nausea or vomiting Anesthetic complications: no   No notable events documented.  Last Vitals:  Vitals:   03/07/21 0715 03/07/21 0902  BP: 125/90 (!) 153/102  Pulse: 71 65  Resp: 17 20  Temp: 36.8 C (!) 36.3 C  SpO2: 95% 97%    Last Pain:  Vitals:   03/07/21 0902  TempSrc:   PainSc: 0-No pain                 Marthenia Rolling

## 2021-03-07 NOTE — Brief Op Note (Signed)
03/07/2021  9:04 AM  PATIENT:  Steven Vincent  56 y.o. male  PRE-OPERATIVE DIAGNOSIS:  obstructive sleep apnea  POST-OPERATIVE DIAGNOSIS:  obstructive sleep apnea  PROCEDURE:  Procedure(s): DRUG INDUCED SLEEP ENDOSCOPY (N/A)  SURGEON:  Surgeon(s) and Role:    Melida Quitter, MD - Primary  PHYSICIAN ASSISTANT:   ASSISTANTS: none   ANESTHESIA:   IV sedation  EBL:  None   BLOOD ADMINISTERED:none  DRAINS: none   LOCAL MEDICATIONS USED:  NONE  SPECIMEN:  No Specimen  DISPOSITION OF SPECIMEN:  N/A  COUNTS:  YES  TOURNIQUET:  * No tourniquets in log *  DICTATION: .Note written in EPIC  PLAN OF CARE: Discharge to home after PACU  PATIENT DISPOSITION:  PACU - hemodynamically stable.   Delay start of Pharmacological VTE agent (>24hrs) due to surgical blood loss or risk of bleeding: no

## 2021-03-07 NOTE — Op Note (Signed)
Preop diagnosis: Obstructive sleep apnea Postop diagnosis: same Procedure: Drug-induced sleep endoscopy Surgeon: Redmond Baseman Anesth: IV sedation Compl: None Findings: There is 50% anterior-posterior collapse at the velum making him a candidate for hypoglossal nerve stimulator placement.  There was also significant anterior-posterior collapse at the tongue base.  There was a small papilloma on the posterior surface of the uvula. Description:  After discussing risks, benefits, and alternatives, the patient was brought to the operative suite and placed on the operative table in the supine position.  Anesthesia was induced and the patient was given light sedation to simulate natural sleep. When the proper level was reached, an Afrin-soaked pledget was placed in the right nasal passage for a couple of minutes and then removed.  The fiberoptic laryngoscope was then passed to view the pharynx and larynx.  Findings are noted above and the exam was recorded.  After completion, the scope was removed and the patient was returned to anesthesia for wakeup and was moved to the recovery room in stable condition.

## 2021-03-07 NOTE — Discharge Instructions (Signed)

## 2021-03-08 ENCOUNTER — Encounter (HOSPITAL_BASED_OUTPATIENT_CLINIC_OR_DEPARTMENT_OTHER): Payer: Self-pay | Admitting: Otolaryngology

## 2021-03-09 ENCOUNTER — Telehealth: Payer: Self-pay | Admitting: Pharmacist

## 2021-03-09 DIAGNOSIS — F819 Developmental disorder of scholastic skills, unspecified: Secondary | ICD-10-CM | POA: Diagnosis not present

## 2021-03-09 NOTE — Chronic Care Management (AMB) (Signed)
Chronic Care Management Pharmacy Assistant   Name: Macrae Wiegman  MRN: [id number] DOB: December 20, 1965  Reason for Encounter: Disease State General Assessment   Recent office visits:  03/05/21 Martinique, Betty G, MD - Patient presented for Allergic rhinitis and other concerns. Prescribed Benzonatate 200 mg, Fluticasone Propionate 50 mg and Guaifenesin 200 mg.  02/02/21 Rebekah Chesterfield, LCSW - Patient presented for CCM visit with social worker.  Recent consult visits:  01/30/21 Janina Mayo, MD - Patient presented to [facility] for Exercise Tolerance Test.  01/29/21 Minus Breeding, MD (Cardiology) - Patient presented for shortness of breath. No medication changes.  Hospital visits:  Medication Reconciliation was completed by comparing discharge summary, patients EMR and Pharmacy list, and upon discussion with patient.  Patient presented to [facility] on 03/07/21 due to Drug Induced Sleep Endoscopy. Patient was present for 4 hours.   New?Medications Started at [facility] Discharge:?? -started  None  Medication Changes at Hospital Discharge: -Changed  None  Medications Discontinued at Hospital Discharge: -Stopped  None  Medications that remain the same after Hospital Discharge:??  -All other medications will remain the same.    Medications: Outpatient Encounter Medications as of 03/09/2021  Medication Sig Note   aspirin EC 81 MG tablet Take 81 mg by mouth daily. Swallow whole. 03/07/2021: Not taking   benzonatate (TESSALON) 100 MG capsule Take 2 capsules (200 mg total) by mouth 2 (two) times daily as needed for up to 10 days.    diclofenac (FLECTOR) 1.3 % PTCH Place 1 patch onto the skin daily as needed for pain.    Erenumab-aooe (AIMOVIG) 140 MG/ML SOAJ Inject 140 mg into the skin every 30 (thirty) days.    fluticasone (FLONASE) 50 MCG/ACT nasal spray Place 1 spray into both nostrils 2 (two) times daily.    guaiFENesin 200 MG tablet Take 1 tablet  (200 mg total) by mouth every 4 (four) hours as needed for cough or to loosen phlegm. 03/07/2021: Not taking   meloxicam (MOBIC) 15 MG tablet     MULTAQ 400 MG tablet TAKE 1 TABLET TWICE DAILY WITH MEALS    pravastatin (PRAVACHOL) 20 MG tablet Take 1 tablet (20 mg total) by mouth daily.    rOPINIRole (REQUIP) 0.5 MG tablet TAKE 1 TABLET AT BEDTIME    SUMAtriptan 6 MG/0.5ML SOAJ INJECT 6 MG INTO THE SKIN DAILY AS NEEDED AT MIGRAINE ONSET. MAY REPEAT 1 TIME AFTER 1 HOUR IF NEEDED    tiZANidine (ZANAFLEX) 2 MG tablet Take 1 tablet (2 mg total) by mouth every 6 (six) hours as needed for muscle spasms.    topiramate (TOPAMAX) 50 MG tablet Take one tablet in the AM and two tablets in the PM.    traMADol (ULTRAM) 50 MG tablet Take 1 tablet (50 mg total) by mouth every 6 (six) hours as needed for moderate pain or severe pain (post op pain).    No facility-administered encounter medications on file as of 03/09/2021.  03/12/2021 Name: Laquinton Bihm MRN: [id number] DOB: November 25, 1965 Aashish Hamm is a 56 y.o. year old male who is a primary care patient of Martinique, Malka So, MD.  Comprehensive medication review performed; Spoke to patient regarding cholesterol  Lipid Panel    Component Value Date/Time   CHOL 146 08/04/2020 0953   TRIG 119.0 08/04/2020 0953   TRIG 231 (HH) 12/27/2005 1005   HDL 38.10 (L) 08/04/2020 0953   LDLCALC 84 08/04/2020 0953   LDLDIRECT 70.0 07/07/2018  0833    10-year ASCVD risk score: The 10-year ASCVD risk score (Arnett DK, et al., 2019) is: 5.6%   Values used to calculate the score:     Age: 56 years     Sex: Male     Is Non-Hispanic African American: No     Diabetic: No     Tobacco smoker: No     Systolic Blood Pressure: 494 mmHg     Is BP treated: No     HDL Cholesterol: 38.1 mg/dL     Total Cholesterol: 146 mg/dL  Current antihyperlipidemic regimen:  Pravastatin 20 mg 1 tablet daily  Previous antihyperlipidemic medications tried: None What recent  interventions/DTPs have been made by any provider to improve Cholesterol control since last CPP Visit: Patient reports none. Any recent hospitalizations or ED visits since last visit with CPP? Yes Patient reports he is doing well after the procedure, reports he was not informed of the timeline or schedule of it happening. He reports he only found out about it being scheduled as he called over to the hospital. What diet changes have been made to improve Cholesterol?  Patient reports he eats ready made meals at work often. What exercise is being done to improve Cholesterol?  Patient reports he is still working at [employer].  Adherence Review: Does the patient have >5 day gap between last estimated fill dates? No     Care Gaps: COVID Vaccine - Overdue PNA Vaccine - Overdue Zoster Vaccine - Overdue TDAP Vaccine - Overdue Flu Vaccine - Overdue CCM- 3/23 AWV- 04/05/20  Star Rating Drugs: Pravastatin (Pravachol) 20 mg - Last filled 03/06/21 90 DS at [facility] Pharmacist Assistant [phone]

## 2021-03-26 ENCOUNTER — Telehealth: Payer: Self-pay

## 2021-03-26 NOTE — Telephone Encounter (Signed)
° °  Telephone encounter was:  Successful.  03/26/2021 Name: Jamarien Rodkey MRN: [id number] DOB: [birth year]  Bryce Cheever is a 56 y.o. year old male who is a primary care patient of Martinique, Malka So, MD . The community resource team was consulted for assistance with  Housing and Service Dog information  Care guide performed the following interventions:  Patient is interested in housing for disability and service dog information. Patient would like everything by mail.  Follow Up Plan:  Care guide will outreach resources to assist patient with housing and emotional support animals  [place], [place]  Main Phone: [phone]   E-mail: [email or web address]  Website: [email or web address]

## 2021-03-27 ENCOUNTER — Other Ambulatory Visit: Payer: Self-pay | Admitting: *Deleted

## 2021-03-27 MED ORDER — MULTAQ 400 MG PO TABS: 400.0000 mg | ORAL_TABLET | Freq: Two times a day (BID) | ORAL | 3 refills | Status: DC

## 2021-03-28 DIAGNOSIS — M25561 Pain in right knee: Secondary | ICD-10-CM | POA: Diagnosis not present

## 2021-03-28 DIAGNOSIS — M545 Low back pain, unspecified: Secondary | ICD-10-CM | POA: Diagnosis not present

## 2021-04-06 ENCOUNTER — Ambulatory Visit (INDEPENDENT_AMBULATORY_CARE_PROVIDER_SITE_OTHER): Payer: Medicare HMO

## 2021-04-06 VITALS — BP 120/60 | HR 64 | Temp 97.8°F | Ht 74.0 in | Wt 281.9 lb

## 2021-04-06 DIAGNOSIS — F819 Developmental disorder of scholastic skills, unspecified: Secondary | ICD-10-CM | POA: Diagnosis not present

## 2021-04-06 DIAGNOSIS — Z0000 Encounter for general adult medical examination without abnormal findings: Secondary | ICD-10-CM | POA: Diagnosis not present

## 2021-04-06 NOTE — Progress Notes (Signed)
Subjective:   Steven Vincent is a 56 y.o. male who presents for Medicare Annual/Subsequent preventive examination.  Review of Systems         Objective:    Today's Vitals   04/06/21 1347  BP: 120/60  Pulse: 64  Temp: 97.8 F (36.6 C)  TempSrc: Oral  SpO2: 97%  Weight: 281 lb 14.4 oz (127.9 kg)  Height: 6\' 2"  (1.88 m)   Body mass index is 36.19 kg/m.  Advanced Directives 04/06/2021 03/07/2021 11/28/2020 11/15/2020 04/05/2020 01/31/2020 01/13/2020  Does Patient Have a Medical Advance Directive? Yes Yes Yes Yes Yes Yes No  Type of Paramedic of [place];Living will [place];Living will [place];Living will Living will [place];Living will Living will;Healthcare Power of Attorney -  Does patient want to make changes to medical advance directive? No - Patient declined No - Patient declined No - Guardian declined - No - Patient declined No - Patient declined -  Copy of [place] in Chart? No - copy requested No - copy requested - - No - copy requested - -  Would patient like information on creating a medical advance directive? - - - - - No - Patient declined No - Patient declined    Current Medications (verified) Outpatient Encounter Medications as of 04/06/2021  Medication Sig   aspirin EC 81 MG tablet Take 81 mg by mouth daily. Swallow whole.   diclofenac (FLECTOR) 1.3 % PTCH Place 1 patch onto the skin daily as needed for pain.   dronedarone (MULTAQ) 400 MG tablet Take 1 tablet (400 mg total) by mouth 2 (two) times daily with a meal.   Erenumab-aooe (AIMOVIG) 140 MG/ML SOAJ Inject 140 mg into the skin every 30 (thirty) days.   fluticasone (FLONASE) 50 MCG/ACT nasal spray Place 1 spray into both nostrils 2 (two) times daily.   guaiFENesin 200 MG tablet Take 1 tablet (200 mg total) by mouth every 4 (four) hours as needed for cough or to loosen phlegm.   meloxicam (MOBIC) 15 MG  tablet    pravastatin (PRAVACHOL) 20 MG tablet Take 1 tablet (20 mg total) by mouth daily.   rOPINIRole (REQUIP) 0.5 MG tablet TAKE 1 TABLET AT BEDTIME   SUMAtriptan 6 MG/0.5ML SOAJ INJECT 6 MG INTO THE SKIN DAILY AS NEEDED AT MIGRAINE ONSET. MAY REPEAT 1 TIME AFTER 1 HOUR IF NEEDED   tiZANidine (ZANAFLEX) 2 MG tablet Take 1 tablet (2 mg total) by mouth every 6 (six) hours as needed for muscle spasms.   topiramate (TOPAMAX) 50 MG tablet Take one tablet in the AM and two tablets in the PM.   traMADol (ULTRAM) 50 MG tablet Take 1 tablet (50 mg total) by mouth every 6 (six) hours as needed for moderate pain or severe pain (post op pain).   No facility-administered encounter medications on file as of 04/06/2021.    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   Dysrhythmia    bradycardia   Headache(784.0)    Knee pain    Low back pain    Migraines    OSA (obstructive sleep apnea) 04/10/2017   Personality disorder ([place])    Seizures ([place])    Past Surgical History:  Procedure Laterality Date   DRUG INDUCED  ENDOSCOPY N/A 03/07/2021   Procedure: DRUG INDUCED SLEEP ENDOSCOPY;  Surgeon: Melida Quitter, MD;  Location: [facility];  Service: ENT;  Laterality: N/A;   TOOTH EXTRACTION  12/2014   lower left   TOTAL KNEE ARTHROPLASTY Left 11/28/2020   Procedure: LEFT KNEE PATELLAFEMORL REPLACEMENT;  Surgeon: Melrose Nakayama, MD;  Location: [facility];  Service: Orthopedics;  Laterality: Left;   Family History  Problem Relation Age of Onset   Heart disease Father        defibrilator   Hyperlipidemia Father    Hypertension Father    Alzheimer's disease Father    Other Father        stents in legs   Aneurysm Mother        brain   Hypertension Brother    Stroke Brother        X2   Social  History   Socioeconomic History   Marital status: Divorced    Spouse name: Not on file   Number of children: 0   Years of education: Not on file   Highest education level: Not on file  Occupational History   Occupation: disabled  Tobacco Use   Smoking status: Former    Packs/day: 1.00    Years: 1.00    Pack years: 1.00    Types: Cigarettes    Quit date: 2000    Years since quitting: 23.1   Smokeless tobacco: Never   Tobacco comments:    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 Wife of 16 years left him about 2 years ago, and pt. has had difficulty adjusting.   Says his brother Francee Piccolo, who lives in [place], is his main contact, source of support, as well as his dog.    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 States he uses painting as additional therapy.    Social Determinants of Health   Financial Resource Strain: Medium Risk   Difficulty of Paying Living Expenses: Somewhat hard  Food Insecurity: Food Insecurity Present   Worried About Charity fundraiser in the Last Year: Often true   Arboriculturist in the Last Year: Sometimes true  Transportation Needs: No Transportation Needs   Lack of Transportation (Medical): No   Lack of Transportation (Non-Medical): No  Physical Activity: Sufficiently Active   Days of Exercise per Week: 3 days   Minutes of Exercise per Session: 120 min  Stress: No Stress Concern Present   Feeling of Stress : Not at all  Social Connections: Moderately Integrated   Frequency of Communication with Friends and Family: More than three times a week   Frequency of Social Gatherings with Friends and Family: More than  three times a week   Attends Religious Services: More than 4 times per year   Active Member of [employer] or Organizations: Yes   Attends Music therapist: More than 4 times per year   Marital Status: Divorced    Tobacco Counseling Counseling given: Not Answered Tobacco comments: states he only tried as a teenager   Clinical Intake:  Pre-visit preparation completed: Yes Diabetic? No  Activities of  Daily Living In your present state of health, do you have any difficulty performing the following activities: 04/06/2021 03/07/2021  Hearing? N N  Comment - -  Vision? N N  Difficulty concentrating or making decisions? N N  Walking or climbing stairs? N N  Dressing or bathing? N N  Doing errands, shopping? N -  Preparing Food and eating ? N -  Using the Toilet? N -  In the past six months, have you accidently leaked urine? N -  Do you have problems with loss of bowel control? N -  Managing your Medications? N -  Managing your Finances? N -  Housekeeping or managing your Housekeeping? N -  Some recent data might be hidden    Patient Care Team: [place], Betty G, MD as PCP - General (Family Medicine) Minus Breeding, MD as PCP - Cardiology (Cardiology) Constance Haw, MD as PCP - Electrophysiology (Cardiology) Norma Fredrickson, MD as Consulting Physician (Psychiatry) Minus Breeding, MD as Consulting Physician (Cardiology) Phylliss Bob, MD as Consulting Physician (Orthopedic Surgery) Kathrynn Ducking, MD (Inactive) as Consulting Physician (Neurology) Viona Gilmore, [facility] as Pharmacist (Pharmacist)  Indicate any recent Medical Services you may have received from other than [facility] providers in the past year (date may be approximate).     Assessment:   This is a routine wellness examination for [place].  Hearing/Vision screen Hearing Screening - Comments:: Wears hearing aids. Vision Screening - Comments:: Wears glasses. Followed by [facility]  Dietary issues  and exercise activities discussed: Current Exercise Habits: Home exercise routine, Type of exercise: walking, Time (Minutes): > 60, Frequency (Times/Week): 3, Weekly Exercise (Minutes/Week): 0, Intensity: Moderate, Exercise limited by: None identified   Goals Addressed               This Visit's Progress     Patient Stated (pt-stated)        secure affordable housing.       Depression Screen PHQ 2/9 Scores 04/06/2021 04/05/2020 03/17/2019 03/16/2018 03/04/2017  PHQ - 2 Score 0 0 0 0 0  PHQ- 9 Score - - - 7 -    Fall Risk Fall Risk  04/06/2021 04/05/2020 03/20/2019 03/16/2018 03/04/2017  Falls in the past year? 0 1 0 0 No  Number falls in past yr: 0 0 0 - -  Injury with Fall? 0 0 0 - -  Risk for fall due to : No Fall Risks No Fall Risks Orthopedic patient - -  Follow up - Falls evaluation completed;Falls prevention discussed Education provided - -    FALL RISK PREVENTION PERTAINING TO THE HOME:  Any stairs in or around the home? Yes  If so, are there any without handrails? No  Home free of loose throw rugs in walkways, pet beds, electrical cords, etc? Yes  Adequate lighting in your home to reduce risk of falls? Yes   ASSISTIVE DEVICES UTILIZED TO PREVENT FALLS:  Life alert? Yes  Use of a cane, walker or w/c? No  Grab bars in the bathroom? Yes  Shower chair or bench in shower? No  Elevated toilet seat or a handicapped toilet? No   TIMED UP AND GO:  Was the test performed? Yes .  Length of time to ambulate 10 feet: 5 sec.   Gait steady and fast without use of assistive device  Cognitive Function:   6CIT Screen 04/06/2021  What Year? 0 points  What month? 0 points  What time? 0 points  Count back from 20 2  points  Months in reverse 2 points  Repeat phrase 0 points  Total Score 4    Immunizations Immunization History  Administered Date(s) Administered   Influenza Split 12/03/2010   Influenza Whole 12/04/2006, 11/17/2007, 01/11/2009   Influenza,inj,Quad PF,6+ Mos  10/25/2018   Td 12/04/2006    TDAP status: Due, Education has been provided regarding the importance of this vaccine. Advised may receive this vaccine at local pharmacy or Health Dept. Aware to provide a copy of the vaccination record if obtained from local pharmacy or Health Dept. Verbalized acceptance and understanding.  Flu Vaccine status: Declined, Education has been provided regarding the importance of this vaccine but patient still declined. Advised may receive this vaccine at local pharmacy or Health Dept. Aware to provide a copy of the vaccination record if obtained from local pharmacy or Health Dept. Verbalized acceptance and understanding.  Pneumococcal vaccine status: Declined,  Education has been provided regarding the importance of this vaccine but patient still declined. Advised may receive this vaccine at local pharmacy or Health Dept. Aware to provide a copy of the vaccination record if obtained from local pharmacy or Health Dept. Verbalized acceptance and understanding.   Covid-19 vaccine status: Declined, Education has been provided regarding the importance of this vaccine but patient still declined. Advised may receive this vaccine at local pharmacy or Health Dept.or vaccine clinic. Aware to provide a copy of the vaccination record if obtained from local pharmacy or Health Dept. Verbalized acceptance and understanding.  Qualifies for Shingles Vaccine? Yes   Zostavax completed No   Shingrix Completed?: No.    Education has been provided regarding the importance of this vaccine. Patient has been advised to call insurance company to determine out of pocket expense if they have not yet received this vaccine. Advised may also receive vaccine at local pharmacy or Health Dept. Verbalized acceptance and understanding.  Screening Tests Health Maintenance  Topic Date Due   COVID-19 Vaccine (1) 04/22/2021 (Originally 02/05/1966)   INFLUENZA VACCINE  05/25/2021 (Originally 09/25/2020)    Zoster Vaccines- Shingrix (1 of 2) 07/04/2021 (Originally 08/06/1984)   TETANUS/TDAP  04/06/2022 (Originally 12/03/2016)   HIV Screening  03/17/2023 (Originally 08/06/1980)   COLONOSCOPY (Pts 45-48yrs Insurance coverage will need to be confirmed)  09/20/2025   Hepatitis C Screening  Completed   HPV VACCINES  Aged Out    Health Maintenance  There are no preventive care reminders to display for this patient.   Colorectal cancer screening: Type of screening: Colonoscopy. Completed 09/21/15. Repeat every 10 years  Lung Cancer Screening: (Low Dose CT Chest recommended if Age 17-80 years, 30 pack-year currently smoking OR have quit w/in 15years.) does qualify.    Additional Screening:  Hepatitis C Screening: does qualify; Completed 08/04/20  Vision Screening: Recommended annual ophthalmology exams for early detection of glaucoma and other disorders of the eye. Is the patient up to date with their annual eye exam?  Yes  Who is the provider or what is the name of the office in which the patient attends annual eye exams? [facility] If pt is not established with a provider, would they like to be referred to a provider to establish care? No .   Dental Screening: Recommended annual dental exams for proper oral hygiene  Community Resource Referral / Chronic Care Management:  CRR required this visit?  No   CCM required this visit?  No      Plan:     I have personally reviewed and noted the following in  the patients chart:   Medical and social history Use of alcohol, tobacco or illicit drugs  Current medications and supplements including opioid prescriptions. Patient is currently taking opioid prescriptions. Information provided to patient regarding non-opioid alternatives. Patient advised to discuss non-opioid treatment plan with their provider. Functional ability and status Nutritional status Physical activity Advanced directives List of other physicians Hospitalizations,  surgeries, and ER visits in previous 12 months Vitals Screenings to include cognitive, depression, and falls Referrals and appointments  In addition, I have reviewed and discussed with patient certain preventive protocols, quality metrics, and best practice recommendations. A written personalized care plan for preventive services as well as general preventive health recommendations were provided to patient.     Criselda Peaches, LPN   10/19/2351   Nurse Notes: None

## 2021-04-06 NOTE — Patient Instructions (Addendum)
Steven Vincent , Thank you for taking time to come for your Medicare Wellness Visit. I appreciate your ongoing commitment to your health goals. Please review the following plan we discussed and let me know if I can assist you in the future.   These are the goals we discussed:  Goals       Exercise 150 min/wk Moderate Activity      When you back is resolved, please walk at least 30 minutes x 2 per day       Obtain Safe and Affordable Housing      Timeframe:  Long-Range Goal Priority:  High Start Date: 02/02/21                      Expected End Date: 05/24/21                 Follow Up Date Within 2 weeks from 02/02/21   Patient Goals/Self-Care Activities: Over the next 120 days Contact PCP office with any questions or concerns Attend scheduled appointments Continue with therapy Utilize resources discussed      Patient Stated (pt-stated)      secure affordable housing.        This is a list of the screening recommended for you and due dates:  Health Maintenance  Topic Date Due   COVID-19 Vaccine (1) 04/22/2021*   Flu Shot  05/25/2021*   Zoster (Shingles) Vaccine (1 of 2) 07/04/2021*   Tetanus Vaccine  04/06/2022*   HIV Screening  03/17/2023*   Colon Cancer Screening  09/20/2025   Hepatitis C Screening: USPSTF Recommendation to screen - Ages 18-79 yo.  Completed   HPV Vaccine  Aged Out  *Topic was postponed. The date shown is not the original due date.   Opioid Pain Medicine Management Opioids are powerful medicines that are used to treat moderate to severe pain. When used for short periods of time, they can help you to: Sleep better. Do better in physical or occupational therapy. Feel better in the first few days after an injury. Recover from surgery. Opioids should be taken with the supervision of a trained health care provider. They should be taken for the shortest period of time possible. This is because opioids can be addictive, and the longer you take opioids, the  greater your risk of addiction. This addiction can also be called opioid use disorder. What are the risks? Using opioid pain medicines for longer than 3 days increases your risk of side effects. Side effects include: Constipation. Nausea and vomiting. Breathing difficulties (respiratory depression). Drowsiness. Confusion. Opioid use disorder. Itching. Taking opioid pain medicine for a long period of time can affect your ability to do daily tasks. It also puts you at risk for: Motor vehicle crashes. Depression. Suicide. Heart attack. Overdose, which can be life-threatening. What is a pain treatment plan? A pain treatment plan is an agreement between you and your health care provider. Pain is unique to each person, and treatments vary depending on your condition. To manage your pain, you and your health care provider need to work together. To help you do this: Discuss the goals of your treatment, including how much pain you might expect to have and how you will manage the pain. Review the risks and benefits of taking opioid medicines. Remember that a good treatment plan uses more than one approach and minimizes the chance of side effects. Be honest about the amount of medicines you take and about any drug or alcohol use.  Get pain medicine prescriptions from only one health care provider. Pain can be managed with many types of alternative treatments. Ask your health care provider to refer you to one or more specialists who can help you manage pain through: Physical or occupational therapy. Counseling (cognitive behavioral therapy). Good nutrition. Biofeedback. Massage. Meditation. Non-opioid medicine. Following a gentle exercise program. How to use opioid pain medicine Taking medicine Take your pain medicine exactly as told by your health care provider. Take it only when you need it. If your pain gets less severe, you may take less than your prescribed dose if your health care  provider approves. If you are not having pain, do nottake pain medicine unless your health care provider tells you to take it. If your pain is severe, do nottry to treat it yourself by taking more pills than instructed on your prescription. Contact your health care provider for help. Write down the times when you take your pain medicine. It is easy to become confused while on pain medicine. Writing the time can help you avoid overdose. Take other over-the-counter or prescription medicines only as told by your health care provider. Keeping yourself and others safe  While you are taking opioid pain medicine: Do not drive, use machinery, or power tools. Do not sign legal documents. Do not drink alcohol. Do not take sleeping pills. Do not supervise children by yourself. Do not do activities that require climbing or being in high places. Do not go to a lake, river, ocean, spa, or swimming pool. Do not share your pain medicine with anyone. Keep pain medicine in a locked cabinet or in a secure area where pets and children cannot reach it. Stopping your use of opioids If you have been taking opioid medicine for more than a few weeks, you may need to slowly decrease (taper) how much you take until you stop completely. Tapering your use of opioids can decrease your risk of symptoms of withdrawal, such as: Pain and cramping in the abdomen. Nausea. Sweating. Sleepiness. Restlessness. Uncontrollable shaking (tremors). Cravings for the medicine. Do not attempt to taper your use of opioids on your own. Talk with your health care provider about how to do this. Your health care provider may prescribe a step-down schedule based on how much medicine you are taking and how long you have been taking it. Getting rid of leftover pills Do not save any leftover pills. Get rid of leftover pills safely by: Taking the medicine to a prescription take-back program. This is usually offered by the county or law  enforcement. Bringing them to a pharmacy that has a drug disposal container. Flushing them down the toilet. Check the label or package insert of your medicine to see whether this is safe to do. Throwing them out in the trash. Check the label or package insert of your medicine to see whether this is safe to do. If it is safe to throw it out, remove the medicine from the original container, put it into a sealable bag or container, and mix it with used coffee grounds, food scraps, dirt, or cat litter before putting it in the trash. Follow these instructions at home: Activity Do exercises as told by your health care provider. Avoid activities that make your pain worse. Return to your normal activities as told by your health care provider. Ask your health care provider what activities are safe for you. General instructions You may need to take these actions to prevent or treat constipation: Drink enough fluid to  keep your urine pale yellow. Take over-the-counter or prescription medicines. Eat foods that are high in fiber, such as beans, whole grains, and fresh fruits and vegetables. Limit foods that are high in fat and processed sugars, such as fried or sweet foods. Keep all follow-up visits. This is important. Where to find support If you have been taking opioids for a long time, you may benefit from receiving support for quitting from a local support group or counselor. Ask your health care provider for a referral to these resources in your area. Where to find more information Centers for Disease Control and Prevention (CDC): http://www.wolf.info/ U.S. Food and Drug Administration (FDA): [place].ch Get help right away if: You may have taken too much of an opioid (overdosed). Common symptoms of an overdose: Your breathing is slower or more shallow than normal. You have a very slow heartbeat (pulse). You have slurred speech. You have nausea and vomiting. Your pupils become very small. You have other  potential symptoms: You are very confused. You faint or feel like you will faint. You have cold, clammy skin. You have blue lips or fingernails. You have thoughts of harming yourself or harming others. These symptoms may represent a serious problem that is an emergency. Do not wait to see if the symptoms will go away. Get medical help right away. Call your local emergency services (911 in the [place] Do not drive yourself to the hospital.  If you ever feel like you may hurt yourself or others, or have thoughts about taking your own life, get help right away. Go to your nearest emergency department or: Call your local emergency services (911 in the [place] Call the [facility] 978-584-0443 in the [place].). Call a suicide crisis helpline, such as the [facility] at (272)248-9505 or 988 in the [place]. This is open 24 hours a day in the [place] Text the Crisis Text Line at 813 753 0787 (in the [place].). Summary Opioid medicines can help you manage moderate to severe pain for a short period of time. A pain treatment plan is an agreement between you and your health care provider. Discuss the goals of your treatment, including how much pain you might expect to have and how you will manage the pain. If you think that you or someone else may have taken too much of an opioid, get medical help right away. This information is not intended to replace advice given to you by your health care provider. Make sure you discuss any questions you have with your health care provider. Document Revised: 09/06/2020 Document Reviewed: 05/24/2020 Elsevier Patient Education  [place] directives: Yes Patient will bring copy  Conditions/risks identified: None  Next appointment: Follow up in one year for your annual wellness visit.   Preventive Care 91 Years and Older, Male Preventive care refers to lifestyle choices and visits with your health care provider that can  promote health and wellness. What does preventive care include? A yearly physical exam. This is also called an annual well check. Dental exams once or twice a year. Routine eye exams. Ask your health care provider how often you should have your eyes checked. Personal lifestyle choices, including: Daily care of your teeth and gums. Regular physical activity. Eating a healthy diet. Avoiding tobacco and drug use. Limiting alcohol use. Practicing safe sex. Taking low doses of aspirin every day. Taking vitamin and mineral supplements as recommended by your health care provider. What happens during an annual well check? The services  and screenings done by your health care provider during your annual well check will depend on your age, overall health, lifestyle risk factors, and family history of disease. Counseling  Your health care provider may ask you questions about your: Alcohol use. Tobacco use. Drug use. Emotional well-being. Home and relationship well-being. Sexual activity. Eating habits. History of falls. Memory and ability to understand (cognition). Work and work Statistician. Screening  You may have the following tests or measurements: Height, weight, and BMI. Blood pressure. Lipid and cholesterol levels. These may be checked every 5 years, or more frequently if you are over 59 years old. Skin check. Lung cancer screening. You may have this screening every year starting at age 42 if you have a 30-pack-year history of smoking and currently smoke or have quit within the past 15 years. Fecal occult blood test (FOBT) of the stool. You may have this test every year starting at age 36. Flexible sigmoidoscopy or colonoscopy. You may have a sigmoidoscopy every 5 years or a colonoscopy every 10 years starting at age 35. Prostate cancer screening. Recommendations will vary depending on your family history and other risks. Hepatitis C blood test. Hepatitis B blood test. Sexually  transmitted disease (STD) testing. Diabetes screening. This is done by checking your blood sugar (glucose) after you have not eaten for a while (fasting). You may have this done every 1-3 years. Abdominal aortic aneurysm (AAA) screening. You may need this if you are a current or former smoker. Osteoporosis. You may be screened starting at age 17 if you are at high risk. Talk with your health care provider about your test results, treatment options, and if necessary, the need for more tests. Vaccines  Your health care provider may recommend certain vaccines, such as: Influenza vaccine. This is recommended every year. Tetanus, diphtheria, and acellular pertussis (Tdap, Td) vaccine. You may need a Td booster every 10 years. Zoster vaccine. You may need this after age 9. Pneumococcal 13-valent conjugate (PCV13) vaccine. One dose is recommended after age 17. Pneumococcal polysaccharide (PPSV23) vaccine. One dose is recommended after age 71. Talk to your health care provider about which screenings and vaccines you need and how often you need them. This information is not intended to replace advice given to you by your health care provider. Make sure you discuss any questions you have with your health care provider. Document Released: 03/10/2015 Document Revised: 11/01/2015 Document Reviewed: 12/13/2014 Elsevier Interactive Patient Education  2017 [place] Prevention in the Home Falls can cause injuries. They can happen to people of all ages. There are many things you can do to make your home safe and to help prevent falls. What can I do on the outside of my home? Regularly fix the edges of walkways and driveways and fix any cracks. Remove anything that might make you trip as you walk through a door, such as a raised step or threshold. Trim any bushes or trees on the path to your home. Use bright outdoor lighting. Clear any walking paths of anything that might make someone trip, such as  rocks or tools. Regularly check to see if handrails are loose or broken. Make sure that both sides of any steps have handrails. Any raised decks and porches should have guardrails on the edges. Have any leaves, snow, or ice cleared regularly. Use sand or salt on walking paths during winter. Clean up any spills in your garage right away. This includes oil or grease spills. What can I do in  the bathroom? Use night lights. Install grab bars by the toilet and in the tub and shower. Do not use towel bars as grab bars. Use non-skid mats or decals in the tub or shower. If you need to sit down in the shower, use a plastic, non-slip stool. Keep the floor dry. Clean up any water that spills on the floor as soon as it happens. Remove soap buildup in the tub or shower regularly. Attach bath mats securely with double-sided non-slip rug tape. Do not have throw rugs and other things on the floor that can make you trip. What can I do in the bedroom? Use night lights. Make sure that you have a light by your bed that is easy to reach. Do not use any sheets or blankets that are too big for your bed. They should not hang down onto the floor. Have a firm chair that has side arms. You can use this for support while you get dressed. Do not have throw rugs and other things on the floor that can make you trip. What can I do in the kitchen? Clean up any spills right away. Avoid walking on wet floors. Keep items that you use a lot in easy-to-reach places. If you need to reach something above you, use a strong step stool that has a grab bar. Keep electrical cords out of the way. Do not use floor polish or wax that makes floors slippery. If you must use wax, use non-skid floor wax. Do not have throw rugs and other things on the floor that can make you trip. What can I do with my stairs? Do not leave any items on the stairs. Make sure that there are handrails on both sides of the stairs and use them. Fix handrails  that are broken or loose. Make sure that handrails are as long as the stairways. Check any carpeting to make sure that it is firmly attached to the stairs. Fix any carpet that is loose or worn. Avoid having throw rugs at the top or bottom of the stairs. If you do have throw rugs, attach them to the floor with carpet tape. Make sure that you have a light switch at the top of the stairs and the bottom of the stairs. If you do not have them, ask someone to add them for you. What else can I do to help prevent falls? Wear shoes that: Do not have high heels. Have rubber bottoms. Are comfortable and fit you well. Are closed at the toe. Do not wear sandals. If you use a stepladder: Make sure that it is fully opened. Do not climb a closed stepladder. Make sure that both sides of the stepladder are locked into place. Ask someone to hold it for you, if possible. Clearly mark and make sure that you can see: Any grab bars or handrails. First and last steps. Where the edge of each step is. Use tools that help you move around (mobility aids) if they are needed. These include: Canes. Walkers. Scooters. Crutches. Turn on the lights when you go into a dark area. Replace any light bulbs as soon as they burn out. Set up your furniture so you have a clear path. Avoid moving your furniture around. If any of your floors are uneven, fix them. If there are any pets around you, be aware of where they are. Review your medicines with your doctor. Some medicines can make you feel dizzy. This can increase your chance of falling. Ask your doctor what  other things that you can do to help prevent falls. This information is not intended to replace advice given to you by your health care provider. Make sure you discuss any questions you have with your health care provider. Document Released: 12/08/2008 Document Revised: 07/20/2015 Document Reviewed: 03/18/2014 Elsevier Interactive Patient Education  2017 [employer].

## 2021-04-13 ENCOUNTER — Telehealth: Payer: Self-pay

## 2021-04-13 NOTE — Telephone Encounter (Signed)
° °  Telephone encounter was:  Successful.  04/13/2021 Name: Steven Vincent MRN: [id number] DOB: Apr 12, 1965  Steven Vincent is a 57 y.o. year old male who is a primary care patient of [place], Malka So, MD . The community resource team was consulted for assistance with  Housing and Service dogs information  Care guide performed the following interventions:  Advised mail sent: [place] for disabled individuals, [facility], [place] accessible homes and [place], and [place] for service dogs  .  Follow Up Plan:  No further follow up planned at this time. The patient has been provided with needed resources. Patient is aware to call if further resources are needed or if he has any questions or concerns.  [place] management  [place], [place]  Main Phone: [phone]   E-mail: [email or web address]  Website: [email or web address]

## 2021-04-17 ENCOUNTER — Telehealth: Payer: Self-pay | Admitting: Family Medicine

## 2021-04-17 NOTE — Telephone Encounter (Signed)
Paperwork received.

## 2021-04-17 NOTE — Telephone Encounter (Signed)
Elwin Sleight, an employment specialist, stopped by to drop off paperwork on behalf of patient that needs to be filled out by Dr,Jordan. Paperwork was placed in folder to be completed and Anderson Malta left her number to be called for pick up since she will be the one handling it.    Please advise

## 2021-04-18 ENCOUNTER — Telehealth: Payer: Self-pay | Admitting: Family Medicine

## 2021-04-18 NOTE — Telephone Encounter (Signed)
I spoke with patient. He has given [place] verbal consent to discuss his case with [facility].

## 2021-04-18 NOTE — Telephone Encounter (Signed)
I left Anderson Malta a voicemail; we need to know why pt needs the reasonable accommodation filled out.

## 2021-04-18 NOTE — Telephone Encounter (Signed)
Patient is wanting a call back about paperwork that was faxed in from [place].

## 2021-04-18 NOTE — Telephone Encounter (Signed)
Form filled out & in provider's bin to be signed.

## 2021-04-18 NOTE — Telephone Encounter (Signed)
I left Steven Vincent a voicemail letting her know that the form is completed and up front for pick up.

## 2021-04-18 NOTE — Telephone Encounter (Signed)
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

## 2021-04-20 NOTE — Telephone Encounter (Signed)
Steven Vincent is aware form is up front for pick up

## 2021-04-23 ENCOUNTER — Telehealth: Payer: Self-pay | Admitting: Pharmacist

## 2021-04-23 NOTE — Chronic Care Management (AMB) (Addendum)
° ° °  Chronic Care Management Pharmacy Assistant   Name: Steven Vincent  MRN: [id number] DOB: [birth year]  04/23/21 APPOINTMENT REMINDER    Call to patient to  Reschedule Appointment with Jeni Salles on 04/25/21, did not reach patient left voicemail for return call.   Care Gaps: COVID Vaccines - Overdue  Star Rating Drug: Pravastatin 20 mg - Last filled 03/06/21 90 DS at [facility]  Any gaps in medications fill history? None     Medications: Outpatient Encounter Medications as of 04/23/2021  Medication Sig Note   aspirin EC 81 MG tablet Take 81 mg by mouth daily. Swallow whole. 03/07/2021: Not taking   diclofenac (FLECTOR) 1.3 % PTCH Place 1 patch onto the skin daily as needed for pain.    dronedarone (MULTAQ) 400 MG tablet Take 1 tablet (400 mg total) by mouth 2 (two) times daily with a meal.    Erenumab-aooe (AIMOVIG) 140 MG/ML SOAJ Inject 140 mg into the skin every 30 (thirty) days.    fluticasone (FLONASE) 50 MCG/ACT nasal spray Place 1 spray into both nostrils 2 (two) times daily.    guaiFENesin 200 MG tablet Take 1 tablet (200 mg total) by mouth every 4 (four) hours as needed for cough or to loosen phlegm. 03/07/2021: Not taking   meloxicam (MOBIC) 15 MG tablet     pravastatin (PRAVACHOL) 20 MG tablet Take 1 tablet (20 mg total) by mouth daily.    rOPINIRole (REQUIP) 0.5 MG tablet TAKE 1 TABLET AT BEDTIME    SUMAtriptan 6 MG/0.5ML SOAJ INJECT 6 MG INTO THE SKIN DAILY AS NEEDED AT MIGRAINE ONSET. MAY REPEAT 1 TIME AFTER 1 HOUR IF NEEDED    tiZANidine (ZANAFLEX) 2 MG tablet Take 1 tablet (2 mg total) by mouth every 6 (six) hours as needed for muscle spasms.    topiramate (TOPAMAX) 50 MG tablet Take one tablet in the AM and two tablets in the PM.    traMADol (ULTRAM) 50 MG tablet Take 1 tablet (50 mg total) by mouth every 6 (six) hours as needed for moderate pain or severe pain (post op pain).    No facility-administered encounter medications on file as of 04/23/2021.    [place] Clinical Pharmacist Assistant [phone]

## 2021-04-24 NOTE — Progress Notes (Signed)
Patient returned call this morning, advised he is not currently interested in following up with MP at this time as he is doing fine with his medications having no issues and just went over them during his AWV. Advised he may reach out to me in the event anything changes.   [place] Clinical Pharmacist Assistant [phone]

## 2021-04-25 ENCOUNTER — Telehealth: Payer: Medicare HMO

## 2021-05-01 ENCOUNTER — Other Ambulatory Visit: Payer: Self-pay | Admitting: Otolaryngology

## 2021-05-01 NOTE — Progress Notes (Signed)
PATIENT: Steven Vincent DOB: 1965/06/19  REASON FOR VISIT: follow up for migraines, seizures HISTORY FROM: patient PRIMARY NEUROLOGIST: Dr. Brett Fairy   HISTORY OF PRESENT ILLNESS: Today 05/02/21 [name] here today for follow-up with history of migraines and seizures.  On Topamax and Aimovig.  He tapered off nortriptyline.  Takes Imitrex injection for acute headache. Headaches doing well, triggered by storms. Aimovig works great, no side effects. Hasn't need Imitrex injection since October when he had knee surgery, his medications are expired. He is having inspire implanted coming up. Working on weight loss. No seizures, on Topamax, last seizure was early 2000's. Requip at night helps his legs. He works at [employer] part time, 3 days a week. Living in a garage, waiting to get housing approved. His emotional support service dog passed away last year.   05/15/2020 SS: [name] is a 56 year old male with history of migraine headache and seizures.  Migraines are well controlled with Aimovig and nortriptyline.  He has Imitrex injection for acute headache, hasn't needed since he started the Aimovig.  Seizures are well controlled with Topamax. He moved to [place]. He is on CPAP for OSA, isn't using it, claims he can't breathe. Marland KitchenHe is working at [employer]. Has a bad left knee. Lives with an 75 year old woman friend, this is stressful for him, he has no other place to go. He doesn't sleep well because his bed is in storage, working on getting a place to live of his own in [place]. He has an emotional support dog. He drives a car.  Here today for evaluation unaccompanied.  HISTORY JM: Today, 07/14/2019, [name] returns for follow-up regarding migraine headache and seizures.  Migraine headaches have been well controlled with use of Aimovig and nortriptyline.  Imitrex injection for emergent use as needed. He reports last migraine in April but otherwise has not had one in a while.   Denies recurrent seizure activity with ongoing use of Topamax. He does report moving to [place] at the end of the month. He does plan on establishing care with PCP and neurology in [place]. No concerns at this time.      REVIEW OF SYSTEMS: Out of a complete 14 system review of symptoms, the patient complains only of the following symptoms, and all other reviewed systems are negative.  See HPI  ALLERGIES: Allergies  Allergen Reactions   Delsym [Dextromethorphan] Other (See Comments)    Gets loopy/memory loss   Aspirin     GI upset Other reaction(s): Vomiting   Mucinex D [Pseudoephedrine-Guaifenesin Er] Hypertension   Ibuprofen Other (See Comments)    Stomach irritation    HOME MEDICATIONS: Outpatient Medications Prior to Visit  Medication Sig Dispense Refill   diclofenac (FLECTOR) 1.3 % PTCH Place 1 patch onto the skin daily as needed for pain.     dronedarone (MULTAQ) 400 MG tablet Take 1 tablet (400 mg total) by mouth 2 (two) times daily with a meal. 180 tablet 3   Erenumab-aooe (AIMOVIG) 140 MG/ML SOAJ Inject 140 mg into the skin every 30 (thirty) days. 3 mL 3   pravastatin (PRAVACHOL) 20 MG tablet Take 1 tablet (20 mg total) by mouth daily. 90 tablet 3   rOPINIRole (REQUIP) 0.5 MG tablet TAKE 1 TABLET AT BEDTIME 90 tablet 1   SUMAtriptan 6 MG/0.5ML SOAJ INJECT 6 MG INTO THE SKIN DAILY AS NEEDED AT MIGRAINE ONSET. MAY REPEAT 1 TIME AFTER 1 HOUR IF NEEDED 0.5 mL 0   tiZANidine (ZANAFLEX)  2 MG tablet Take 1 tablet (2 mg total) by mouth every 6 (six) hours as needed for muscle spasms. 30 tablet 1   topiramate (TOPAMAX) 50 MG tablet Take one tablet in the AM and two tablets in the PM. 270 tablet 3   traMADol (ULTRAM) 50 MG tablet Take 1 tablet (50 mg total) by mouth every 6 (six) hours as needed for moderate pain or severe pain (post op pain). 30 tablet 0   aspirin EC 81 MG tablet Take 81 mg by mouth daily. Swallow whole.     fluticasone (FLONASE) 50 MCG/ACT nasal spray Place 1 spray  into both nostrils 2 (two) times daily. 16 g 3   guaiFENesin 200 MG tablet Take 1 tablet (200 mg total) by mouth every 4 (four) hours as needed for cough or to loosen phlegm. 30 suppository 0   meloxicam (MOBIC) 15 MG tablet      No facility-administered medications prior to visit.    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   Dysrhythmia    bradycardia   Headache(784.0)    Knee pain    Low back pain    Migraines    OSA (obstructive sleep apnea) 04/10/2017   Personality disorder ([place])    Seizures ([place])     PAST SURGICAL HISTORY: Past Surgical History:  Procedure Laterality Date   DRUG INDUCED ENDOSCOPY N/A 03/07/2021   Procedure: DRUG INDUCED SLEEP ENDOSCOPY;  Surgeon: Melida Quitter, MD;  Location: [facility];  Service: ENT;  Laterality: N/A;   TOOTH EXTRACTION  12/2014   lower left   TOTAL KNEE ARTHROPLASTY Left 11/28/2020   Procedure: LEFT KNEE PATELLAFEMORL REPLACEMENT;  Surgeon: Melrose Nakayama, MD;  Location: [facility];  Service: Orthopedics;  Laterality: Left;    FAMILY HISTORY: Family History  Problem Relation Age of Onset   Heart disease Father        defibrilator   Hyperlipidemia Father    Hypertension Father    Alzheimer's disease Father    Other Father        stents in legs   Aneurysm Mother        brain   Hypertension Brother    Stroke Brother        X2    SOCIAL HISTORY: Social History   Socioeconomic History   Marital status: Divorced    Spouse name: Not on file   Number of children: 0   Years of education: Not on file   Highest education level: Not on file  Occupational History   Occupation: disabled  Tobacco Use   Smoking status: Former    Packs/day: 1.00    Years: 1.00    Pack years: 1.00    Types: Cigarettes    Quit date: 2000    Years since quitting: 23.1   Smokeless tobacco: Never   Tobacco comments:    states he only tried as a  Wellsite geologist Use: Never used  Substance and Sexual Activity   Alcohol use: No   Drug use: No   Sexual activity: Not Currently  Other Topics Concern   Not on file  Social History Narrative   03/16/18: 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 Wife of 16 years left him about 2 years ago, and pt. has had difficulty adjusting.   Says his brother Francee Piccolo, who lives  in [place], is his main contact, source of support, as well as his dog.    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 States he uses painting as additional therapy.    Social Determinants of Health   Financial Resource Strain: Medium Risk   Difficulty of Paying Living Expenses: Somewhat hard  Food Insecurity: Food Insecurity Present   Worried About Charity fundraiser in the Last Year: Often true   Arboriculturist in the Last Year: Sometimes true  Transportation Needs: No Transportation Needs   Lack of Transportation (Medical): No   Lack of Transportation (Non-Medical): No  Physical Activity: Sufficiently Active   Days of Exercise per Week: 3 days   Minutes of Exercise per Session: 120 min  Stress: No Stress Concern Present   Feeling of Stress : Not at all  Social Connections: Moderately Integrated   Frequency of Communication with Friends and Family: More than three times a week   Frequency of Social Gatherings with Friends and Family: More than three times a week   Attends Religious Services: More than 4 times per year   Active Member of [employer] or Organizations: Yes   Attends Music therapist: More than 4 times per year   Marital Status: Divorced  Human resources officer Violence: Unknown   Fear of Current or Ex-Partner: No   Emotionally Abused: No   Physically Abused: Not on file   Sexually Abused: No   PHYSICAL EXAM  Vitals:   05/02/21 0814  Weight: 287 lb 8 oz (130.4 kg)  Height: '6\' 2"'$  (1.88 m)     Body mass index is 36.91 kg/m.  Generalized: Well developed, in no acute distress, well appearing Neurological examination  Mentation: Alert oriented to time, place, history taking. Follows all commands speech and language fluent Cranial nerve II-XII: Pupils were equal round reactive to light. Extraocular movements were full, visual field were full on confrontational test. Facial sensation and strength were normal.  Head turning and shoulder shrug  were normal and symmetric. Motor: The motor testing reveals 5 over 5 strength of all 4 extremities. Good symmetric motor tone is noted throughout.  Sensory: Sensory testing is intact to soft touch on all 4 extremities. No evidence of extinction is noted.  Coordination: Cerebellar testing reveals good finger-nose-finger and heel-to-shin bilaterally.  Gait and station: slight limp on the left, antalgic  Reflexes: Deep tendon reflexes are symmetric and normal bilaterally.   DIAGNOSTIC DATA (LABS, IMAGING, TESTING) - I reviewed patient records, labs, notes, testing and imaging myself where available.  Lab Results  Component Value Date   WBC 6.8 01/29/2021   HGB 15.0 01/29/2021   HCT 43.5 01/29/2021   MCV 93 01/29/2021   PLT 167 01/29/2021      Component Value Date/Time   NA 142 01/29/2021 1141   K 4.6 01/29/2021 1141   CL 108 (H) 01/29/2021 1141   CO2 22 01/29/2021 1141   GLUCOSE 98 01/29/2021 1141   GLUCOSE 90 11/15/2020 1055   BUN 18 01/29/2021 1141   CREATININE 0.86 01/29/2021 1141   CALCIUM 9.5 01/29/2021 1141   PROT 7.0 08/04/2020 0953   PROT 7.0 01/19/2014 1041   ALBUMIN 4.5 08/04/2020 0953   ALBUMIN 4.7 01/19/2014 1041   AST 20 08/04/2020 0953   ALT 23 08/04/2020 0953   ALKPHOS 62 08/04/2020 0953   BILITOT 0.4 08/04/2020 0953   GFRNONAA >60 11/15/2020 1055  GFRAA 90 04/19/2019 1526   Lab Results  Component Value Date   CHOL 146 08/04/2020   HDL 38.10 (L) 08/04/2020   LDLCALC 84 08/04/2020   LDLDIRECT 70.0  07/07/2018   TRIG 119.0 08/04/2020   CHOLHDL 4 08/04/2020   Lab Results  Component Value Date   HGBA1C 5.3 08/04/2020   Lab Results  Component Value Date   YNWGNFAO13 086 10/16/2016   Lab Results  Component Value Date   TSH 3.04 02/09/2020   ASSESSMENT AND PLAN 56 y.o. year old male  has a past medical history of Arthritis, Baker's cyst, Cancer ([place]), Dysrhythmia, Headache(784.0), Knee pain, Low back pain, Migraines, OSA (obstructive sleep apnea) (04/10/2017), Personality disorder ([place]), and Seizures ([place]). here with:  1.  Chronic migraine headaches 2.  Seizures, well controlled 3.  Restless leg symptoms   -Under good control  -Continue Aimovig 140 mg for migraine prevention  -Continue Topamax for seizure prevention/migraine prevention  -Continue Imitrex injection as needed for acute headache -Continue Requip 0.5 mg at bedtime for RLS -Has done well off the nortriptyline, is having INSPIRE implanted soon, he is excited about his upcoming book about his life story coming out -Return back in 1 year or sooner if needed, will be followed by Dr. Darrold Span, AGNP-C, DNP 05/02/2021, 8:17 AM [facility] [address], [place], [place] [address] [phone]

## 2021-05-02 ENCOUNTER — Ambulatory Visit (INDEPENDENT_AMBULATORY_CARE_PROVIDER_SITE_OTHER): Payer: Medicare HMO | Admitting: Neurology

## 2021-05-02 ENCOUNTER — Encounter: Payer: Self-pay | Admitting: Neurology

## 2021-05-02 ENCOUNTER — Other Ambulatory Visit: Payer: Self-pay

## 2021-05-02 VITALS — BP 141/93 | HR 67 | Ht 74.0 in | Wt 287.5 lb

## 2021-05-02 DIAGNOSIS — G43009 Migraine without aura, not intractable, without status migrainosus: Secondary | ICD-10-CM | POA: Diagnosis not present

## 2021-05-02 DIAGNOSIS — R569 Unspecified convulsions: Secondary | ICD-10-CM | POA: Diagnosis not present

## 2021-05-02 DIAGNOSIS — G2581 Restless legs syndrome: Secondary | ICD-10-CM | POA: Diagnosis not present

## 2021-05-02 MED ORDER — ROPINIROLE HCL 0.5 MG PO TABS: 0.5000 mg | ORAL_TABLET | Freq: Every day | ORAL | 1 refills | Status: DC

## 2021-05-02 MED ORDER — AIMOVIG 140 MG/ML ~~LOC~~ SOAJ
140.0000 mg | SUBCUTANEOUS | 3 refills | Status: DC
Start: 2021-05-02 — End: 2022-04-25

## 2021-05-02 MED ORDER — TOPIRAMATE 50 MG PO TABS: ORAL_TABLET | ORAL | 3 refills | Status: DC

## 2021-05-02 MED ORDER — SUMATRIPTAN SUCCINATE 6 MG/0.5ML ~~LOC~~ SOAJ: SUBCUTANEOUS | 1 refills | Status: DC

## 2021-05-02 NOTE — Patient Instructions (Signed)
Continue current medications, I have sent refills  ?See you back in 1 year  ?

## 2021-05-07 ENCOUNTER — Other Ambulatory Visit: Payer: Self-pay

## 2021-05-07 ENCOUNTER — Encounter (HOSPITAL_BASED_OUTPATIENT_CLINIC_OR_DEPARTMENT_OTHER): Payer: Self-pay | Admitting: Otolaryngology

## 2021-05-10 DIAGNOSIS — Z6835 Body mass index (BMI) 35.0-35.9, adult: Secondary | ICD-10-CM | POA: Diagnosis not present

## 2021-05-10 DIAGNOSIS — G4733 Obstructive sleep apnea (adult) (pediatric): Secondary | ICD-10-CM | POA: Diagnosis not present

## 2021-05-11 DIAGNOSIS — F2089 Other schizophrenia: Secondary | ICD-10-CM | POA: Diagnosis not present

## 2021-05-14 ENCOUNTER — Other Ambulatory Visit: Payer: Self-pay

## 2021-05-14 ENCOUNTER — Other Ambulatory Visit: Payer: Self-pay | Admitting: Cardiology

## 2021-05-14 ENCOUNTER — Encounter (HOSPITAL_COMMUNITY): Payer: Self-pay | Admitting: Otolaryngology

## 2021-05-14 DIAGNOSIS — E782 Mixed hyperlipidemia: Secondary | ICD-10-CM

## 2021-05-14 NOTE — Progress Notes (Signed)
PCP - Dr Betty Martinique ?Cardiologist - Dr Minus Breeding ?Neurology - Butler Denmark, NP ?Pulmonology - Dr [name] ? ?Chest x-ray - 11/15/20 (2V) ?EKG - 01/29/21 ?Stress Test - 01/30/21 ?ECHO - 04/28/19 ?Cardiac Cath - n/a ? ?ICD Pacemaker/Loop - n/a ? ?Sleep Study -  Yes ?CPAP - does not use CPAP ? ?Anesthesia review: Yes ? ?STOP now taking any Aspirin (unless otherwise instructed by your surgeon), Aleve, Naproxen, Ibuprofen, Motrin, Advil, Goody's, BC's, all herbal medications, fish oil, and all vitamins.  ? ?Coronavirus Screening ?Covid test n/a Ambulatory Surgery  ?Do you have any of the following symptoms:  ?Cough yes/no: No ?Fever (>100.68F)  yes/no: No ?Runny nose yes/no: No ?Sore throat yes/no: No ?Difficulty breathing/shortness of breath  yes/no: No ? ?Have you traveled in the last 14 days and where? yes/no: No ? ?Patient verbalized understanding of instructions that were given via. ?

## 2021-05-15 NOTE — Anesthesia Preprocedure Evaluation (Addendum)
Anesthesia Evaluation  ?Patient identified by MRN, date of birth, ID band ?Patient awake ? ? ? ?Reviewed: ?Allergy & Precautions, NPO status , Patient's Chart, lab work & pertinent test results ? ?Airway ?Mallampati: IV ? ?TM Distance: >3 FB ?Neck ROM: Full ? ? ? Dental ? ?(+) Dental Advisory Given, Missing, Poor Dentition ?  ?Pulmonary ?sleep apnea , former smoker,  ?Quit smoking 2000 ?  ?Pulmonary exam normal ? ? ? ? ? ? ? Cardiovascular ?negative cardio ROS ?Normal cardiovascular exam ? ? ?  ?Neuro/Psych ? Headaches, Seizures -,  PSYCHIATRIC DISORDERS (schizotypal personality disorder)   ? GI/Hepatic ?negative GI ROS, Neg liver ROS,   ?Endo/Other  ?Obesity BMI 35 ? Renal/GU ?negative Renal ROS  ?negative genitourinary ?  ?Musculoskeletal ? ?(+) Arthritis , Osteoarthritis,   ? Abdominal ?  ?Peds ? Hematology ?negative hematology ROS ?(+)   ?Anesthesia Other Findings ? ? Reproductive/Obstetrics ?negative OB ROS ? ?  ? ? ? ? ? ? ? ? ? ? ? ? ? ?  ?  ? ? ? ? ? ?Anesthesia Physical ?Anesthesia Plan ? ?ASA: 2 ? ?Anesthesia Plan: General  ? ?Post-op Pain Management: Tylenol PO (pre-op)*  ? ?Induction: Intravenous ? ?PONV Risk Score and Plan: 3 and Ondansetron, Dexamethasone, Midazolam and Treatment may vary due to age or medical condition ? ?Airway Management Planned: Oral ETT ? ?Additional Equipment: None ? ?Intra-op Plan:  ? ?Post-operative Plan: Extubation in OR ? ?Informed Consent: I have reviewed the patients History and Physical, chart, labs and discussed the procedure including the risks, benefits and alternatives for the proposed anesthesia with the patient or authorized representative who has indicated his/her understanding and acceptance.  ? ? ? ?Dental advisory given ? ?Plan Discussed with:  ? ?Anesthesia Plan Comments:   ? ? ? ? ? ?Anesthesia Quick Evaluation ? ?

## 2021-05-16 ENCOUNTER — Ambulatory Visit (HOSPITAL_COMMUNITY): Payer: Medicare HMO

## 2021-05-16 ENCOUNTER — Ambulatory Visit (HOSPITAL_BASED_OUTPATIENT_CLINIC_OR_DEPARTMENT_OTHER): Payer: Medicare HMO | Admitting: Anesthesiology

## 2021-05-16 ENCOUNTER — Ambulatory Visit (HOSPITAL_COMMUNITY): Payer: Medicare HMO | Admitting: Anesthesiology

## 2021-05-16 ENCOUNTER — Encounter (HOSPITAL_COMMUNITY)
Admission: RE | Disposition: A | Discharge status: Home / Self Care | Payer: Self-pay | Source: Home / Self Care | Attending: Otolaryngology

## 2021-05-16 ENCOUNTER — Ambulatory Visit (HOSPITAL_COMMUNITY)
Admission: RE | Admit: 2021-05-16 | Discharge: 2021-05-16 | Disposition: A | Discharge status: Home / Self Care | Payer: Medicare HMO | Attending: Otolaryngology | Admitting: Otolaryngology

## 2021-05-16 ENCOUNTER — Other Ambulatory Visit: Payer: Self-pay

## 2021-05-16 ENCOUNTER — Encounter (HOSPITAL_COMMUNITY): Payer: Self-pay | Admitting: Otolaryngology

## 2021-05-16 DIAGNOSIS — E669 Obesity, unspecified: Secondary | ICD-10-CM | POA: Diagnosis not present

## 2021-05-16 DIAGNOSIS — R6889 Other general symptoms and signs: Secondary | ICD-10-CM | POA: Diagnosis not present

## 2021-05-16 DIAGNOSIS — G4733 Obstructive sleep apnea (adult) (pediatric): Secondary | ICD-10-CM

## 2021-05-16 DIAGNOSIS — F21 Schizotypal disorder: Secondary | ICD-10-CM | POA: Insufficient documentation

## 2021-05-16 DIAGNOSIS — Z6834 Body mass index (BMI) 34.0-34.9, adult: Secondary | ICD-10-CM | POA: Diagnosis not present

## 2021-05-16 DIAGNOSIS — J9811 Atelectasis: Secondary | ICD-10-CM | POA: Diagnosis not present

## 2021-05-16 DIAGNOSIS — G40909 Epilepsy, unspecified, not intractable, without status epilepticus: Secondary | ICD-10-CM | POA: Diagnosis not present

## 2021-05-16 DIAGNOSIS — G43909 Migraine, unspecified, not intractable, without status migrainosus: Secondary | ICD-10-CM | POA: Insufficient documentation

## 2021-05-16 DIAGNOSIS — Z87891 Personal history of nicotine dependence: Secondary | ICD-10-CM | POA: Insufficient documentation

## 2021-05-16 DIAGNOSIS — Z79899 Other long term (current) drug therapy: Secondary | ICD-10-CM | POA: Insufficient documentation

## 2021-05-16 HISTORY — DX: Hyperlipidemia, unspecified: E78.5

## 2021-05-16 HISTORY — PX: IMPLANTATION OF HYPOGLOSSAL NERVE STIMULATOR: SHX6827

## 2021-05-16 LAB — CBC
HCT: 41.1 % (ref 39.0–52.0)
Hemoglobin: 14.8 g/dL (ref 13.0–17.0)
MCH: 33.2 pg (ref 26.0–34.0)
MCHC: 36 g/dL (ref 30.0–36.0)
MCV: 92.2 fL (ref 80.0–100.0)
Platelets: 176 10*3/uL (ref 150–400)
RBC: 4.46 MIL/uL (ref 4.22–5.81)
RDW: 13 % (ref 11.5–15.5)
WBC: 7.2 10*3/uL (ref 4.0–10.5)
nRBC: 0 % (ref 0.0–0.2)

## 2021-05-16 SURGERY — INSERTION, HYPOGLOSSAL NERVE STIMULATOR
Anesthesia: General | Site: Neck | Laterality: Right

## 2021-05-16 MED ORDER — CEFAZOLIN IN SODIUM CHLORIDE 3-0.9 GM/100ML-% IV SOLN
3.0000 g | INTRAVENOUS | Status: AC
  Administered 2021-05-16: 2 g via INTRAVENOUS
  Filled 2021-05-16: qty 100

## 2021-05-16 MED ORDER — PROPOFOL 10 MG/ML IV BOLUS
INTRAVENOUS | Status: DC | PRN
  Administered 2021-05-16 (×2): 50 mg via INTRAVENOUS
  Administered 2021-05-16: 200 mg via INTRAVENOUS

## 2021-05-16 MED ORDER — PROPOFOL 10 MG/ML IV BOLUS
INTRAVENOUS | Status: AC
  Filled 2021-05-16: qty 20

## 2021-05-16 MED ORDER — LIDOCAINE-EPINEPHRINE 1 %-1:100000 IJ SOLN
INTRAMUSCULAR | Status: AC
  Filled 2021-05-16: qty 1

## 2021-05-16 MED ORDER — PHENYLEPHRINE 40 MCG/ML (10ML) SYRINGE FOR IV PUSH (FOR BLOOD PRESSURE SUPPORT)
PREFILLED_SYRINGE | INTRAVENOUS | Status: AC
  Filled 2021-05-16: qty 10

## 2021-05-16 MED ORDER — ONDANSETRON HCL 4 MG/2ML IJ SOLN
INTRAMUSCULAR | Status: AC
  Filled 2021-05-16: qty 2

## 2021-05-16 MED ORDER — CHLORHEXIDINE GLUCONATE 0.12 % MT SOLN
OROMUCOSAL | Status: AC
  Administered 2021-05-16: 15 mL
  Filled 2021-05-16: qty 15

## 2021-05-16 MED ORDER — ONDANSETRON HCL 4 MG/2ML IJ SOLN
INTRAMUSCULAR | Status: DC | PRN
Start: 2021-05-16 — End: 2021-05-16
  Administered 2021-05-16: 4 mg via INTRAVENOUS

## 2021-05-16 MED ORDER — SUCCINYLCHOLINE CHLORIDE 200 MG/10ML IV SOSY
PREFILLED_SYRINGE | INTRAVENOUS | Status: AC
  Filled 2021-05-16: qty 10

## 2021-05-16 MED ORDER — PHENYLEPHRINE HCL-NACL 20-0.9 MG/250ML-% IV SOLN
INTRAVENOUS | Status: DC | PRN
  Administered 2021-05-16: 75 ug/min via INTRAVENOUS

## 2021-05-16 MED ORDER — LIDOCAINE 2% (20 MG/ML) 5 ML SYRINGE
INTRAMUSCULAR | Status: DC | PRN
  Administered 2021-05-16: 100 mg via INTRAVENOUS

## 2021-05-16 MED ORDER — HYDROCODONE-ACETAMINOPHEN 7.5-325 MG PO TABS: 1.0000 | ORAL_TABLET | Freq: Four times a day (QID) | ORAL | 0 refills | Status: DC | PRN

## 2021-05-16 MED ORDER — HYDROMORPHONE HCL 1 MG/ML IJ SOLN
0.2500 mg | INTRAMUSCULAR | Status: DC | PRN
  Administered 2021-05-16: 0.25 mg via INTRAVENOUS

## 2021-05-16 MED ORDER — FENTANYL CITRATE (PF) 100 MCG/2ML IJ SOLN
INTRAMUSCULAR | Status: DC | PRN
  Administered 2021-05-16 (×2): 50 ug via INTRAVENOUS
  Administered 2021-05-16: 100 ug via INTRAVENOUS

## 2021-05-16 MED ORDER — OXYCODONE HCL 5 MG/5ML PO SOLN: 5.0000 mg | Freq: Once | ORAL | Status: DC | PRN

## 2021-05-16 MED ORDER — LIDOCAINE 2% (20 MG/ML) 5 ML SYRINGE
INTRAMUSCULAR | Status: AC
  Filled 2021-05-16: qty 5

## 2021-05-16 MED ORDER — PHENYLEPHRINE 40 MCG/ML (10ML) SYRINGE FOR IV PUSH (FOR BLOOD PRESSURE SUPPORT)
PREFILLED_SYRINGE | INTRAVENOUS | Status: DC | PRN
  Administered 2021-05-16 (×2): 80 ug via INTRAVENOUS
  Administered 2021-05-16: 120 ug via INTRAVENOUS

## 2021-05-16 MED ORDER — LACTATED RINGERS IV SOLN: INTRAVENOUS | Status: DC

## 2021-05-16 MED ORDER — 0.9 % SODIUM CHLORIDE (POUR BTL) OPTIME
TOPICAL | Status: DC | PRN
  Administered 2021-05-16: 1000 mL

## 2021-05-16 MED ORDER — DEXAMETHASONE SODIUM PHOSPHATE 10 MG/ML IJ SOLN
INTRAMUSCULAR | Status: AC
  Filled 2021-05-16: qty 1

## 2021-05-16 MED ORDER — AMISULPRIDE (ANTIEMETIC) 5 MG/2ML IV SOLN: 10.0000 mg | Freq: Once | INTRAVENOUS | Status: DC | PRN

## 2021-05-16 MED ORDER — ACETAMINOPHEN 500 MG PO TABS
1000.0000 mg | ORAL_TABLET | Freq: Once | ORAL | Status: AC
  Administered 2021-05-16: 1000 mg via ORAL
  Filled 2021-05-16: qty 2

## 2021-05-16 MED ORDER — MIDAZOLAM HCL 5 MG/5ML IJ SOLN
INTRAMUSCULAR | Status: DC | PRN
  Administered 2021-05-16: 2 mg via INTRAVENOUS

## 2021-05-16 MED ORDER — HYDROMORPHONE HCL 1 MG/ML IJ SOLN
INTRAMUSCULAR | Status: AC
  Filled 2021-05-16: qty 1

## 2021-05-16 MED ORDER — MIDAZOLAM HCL 2 MG/2ML IJ SOLN
INTRAMUSCULAR | Status: AC
  Filled 2021-05-16: qty 2

## 2021-05-16 MED ORDER — SUCCINYLCHOLINE CHLORIDE 200 MG/10ML IV SOSY
PREFILLED_SYRINGE | INTRAVENOUS | Status: DC | PRN
  Administered 2021-05-16: 140 mg via INTRAVENOUS

## 2021-05-16 MED ORDER — ONDANSETRON HCL 4 MG/2ML IJ SOLN: 4.0000 mg | Freq: Once | INTRAMUSCULAR | Status: DC | PRN

## 2021-05-16 MED ORDER — LIDOCAINE-EPINEPHRINE 1 %-1:100000 IJ SOLN
INTRAMUSCULAR | Status: DC | PRN
  Administered 2021-05-16: 5 mL

## 2021-05-16 MED ORDER — DEXAMETHASONE SODIUM PHOSPHATE 4 MG/ML IJ SOLN
INTRAMUSCULAR | Status: DC | PRN
  Administered 2021-05-16: 10 mg via INTRAVENOUS

## 2021-05-16 MED ORDER — FENTANYL CITRATE (PF) 250 MCG/5ML IJ SOLN
INTRAMUSCULAR | Status: AC
Start: 2021-05-16 — End: ?
  Filled 2021-05-16: qty 5

## 2021-05-16 MED ORDER — OXYCODONE HCL 5 MG PO TABS: 5.0000 mg | ORAL_TABLET | Freq: Once | ORAL | Status: DC | PRN

## 2021-05-16 SURGICAL SUPPLY — 65 items
BAG COUNTER SPONGE SURGICOUNT (BAG) ×2 IMPLANT
BLADE CLIPPER SURG (BLADE) IMPLANT
BLADE SURG 15 STRL LF DISP TIS (BLADE) ×3 IMPLANT
BLADE SURG 15 STRL SS (BLADE) ×6
CANISTER SUCT 3000ML PPV (MISCELLANEOUS) ×2 IMPLANT
CORD BIPOLAR FORCEPS 12FT (ELECTRODE) ×2 IMPLANT
COVER PROBE W GEL 5X96 (DRAPES) ×2 IMPLANT
COVER SURGICAL LIGHT HANDLE (MISCELLANEOUS) ×2 IMPLANT
DERMABOND ADVANCED (GAUZE/BANDAGES/DRESSINGS) ×2
DERMABOND ADVANCED .7 DNX12 (GAUZE/BANDAGES/DRESSINGS) ×2 IMPLANT
DRAPE C-ARM 35X43 STRL (DRAPES) ×2 IMPLANT
DRAPE HEAD BAR (DRAPES) ×2 IMPLANT
DRAPE INCISE IOBAN 66X45 STRL (DRAPES) ×2 IMPLANT
DRAPE MICROSCOPE LEICA 54X105 (DRAPES) ×2 IMPLANT
DRAPE UTILITY XL STRL (DRAPES) ×2 IMPLANT
DRSG TEGADERM 4X4.75 (GAUZE/BANDAGES/DRESSINGS) ×6 IMPLANT
ELECT COATED BLADE 2.86 ST (ELECTRODE) ×2 IMPLANT
ELECT EMG 18 NIMS (NEUROSURGERY SUPPLIES) ×2
ELECT REM PT RETURN 9FT ADLT (ELECTROSURGICAL) ×2
ELECTRODE EMG 18 NIMS (NEUROSURGERY SUPPLIES) ×1 IMPLANT
ELECTRODE REM PT RTRN 9FT ADLT (ELECTROSURGICAL) ×1 IMPLANT
FORCEPS BIPOLAR SPETZLER 8 1.0 (NEUROSURGERY SUPPLIES) ×2 IMPLANT
GAUZE 4X4 16PLY ~~LOC~~+RFID DBL (SPONGE) ×2 IMPLANT
GAUZE SPONGE 4X4 12PLY STRL (GAUZE/BANDAGES/DRESSINGS) ×2 IMPLANT
GENERATOR PULSE INSPIRE (Generator) ×2 IMPLANT
GENERATOR PULSE INSPIRE IV (Generator) ×1 IMPLANT
GLOVE SURG ENC MOIS LTX SZ6.5 (GLOVE) IMPLANT
GLOVE SURG ENC MOIS LTX SZ7.5 (GLOVE) ×2 IMPLANT
GOWN STRL REUS W/ TWL LRG LVL3 (GOWN DISPOSABLE) ×3 IMPLANT
GOWN STRL REUS W/TWL LRG LVL3 (GOWN DISPOSABLE) ×6
KIT BASIN OR (CUSTOM PROCEDURE TRAY) ×2 IMPLANT
KIT NEURO ACCESSORY W/WRENCH (MISCELLANEOUS) IMPLANT
KIT TURNOVER KIT B (KITS) ×2 IMPLANT
LEAD SENSING RESP INSPIRE (Lead) ×2 IMPLANT
LEAD SENSING RESP INSPIRE IV (Lead) ×1 IMPLANT
LEAD SLEEP STIM INSPIRE IV/V (Lead) ×1 IMPLANT
LEAD SLEEP STIMULATION INSPIRE (Lead) ×2 IMPLANT
LOOP VESSEL MAXI BLUE (MISCELLANEOUS) ×2 IMPLANT
LOOP VESSEL MINI RED (MISCELLANEOUS) ×2 IMPLANT
MARKER SKIN DUAL TIP RULER LAB (MISCELLANEOUS) ×4 IMPLANT
NDL HYPO 25GX1X1/2 BEV (NEEDLE) ×1 IMPLANT
NEEDLE HYPO 25GX1X1/2 BEV (NEEDLE) ×2 IMPLANT
NS IRRIG 1000ML POUR BTL (IV SOLUTION) ×2 IMPLANT
PAD ARMBOARD 7.5X6 YLW CONV (MISCELLANEOUS) ×2 IMPLANT
PASSER CATH 38CM DISP (INSTRUMENTS) ×2 IMPLANT
PENCIL SMOKE EVACUATOR (MISCELLANEOUS) ×2 IMPLANT
POSITIONER HEAD DONUT 9IN (MISCELLANEOUS) ×2 IMPLANT
PROBE NERVE STIMULATOR (NEUROSURGERY SUPPLIES) ×2 IMPLANT
REMOTE CONTROL SLEEP INSPIRE (MISCELLANEOUS) ×2 IMPLANT
SET WALTER ACTIVATION W/DRAPE (SET/KITS/TRAYS/PACK) ×2 IMPLANT
SPONGE INTESTINAL PEANUT (DISPOSABLE) ×2 IMPLANT
STAPLER VISISTAT 35W (STAPLE) ×2 IMPLANT
SUT SILK 2 0 SH (SUTURE) ×2 IMPLANT
SUT SILK 3 0 REEL (SUTURE) ×2 IMPLANT
SUT SILK 3 0 SH 30 (SUTURE) ×4 IMPLANT
SUT SILK 3-0 (SUTURE) ×2
SUT SILK 3-0 RB1 30XBRD (SUTURE) ×1
SUT VIC AB 3-0 SH 27 (SUTURE) ×4
SUT VIC AB 3-0 SH 27X BRD (SUTURE) ×2 IMPLANT
SUT VIC AB 4-0 PS2 27 (SUTURE) ×4 IMPLANT
SUTURE SILK 3-0 RB1 30XBRD (SUTURE) ×1 IMPLANT
SYR 10ML LL (SYRINGE) ×2 IMPLANT
TAPE CLOTH SURG 4X10 WHT LF (GAUZE/BANDAGES/DRESSINGS) ×2 IMPLANT
TOWEL GREEN STERILE (TOWEL DISPOSABLE) ×2 IMPLANT
TRAY ENT MC OR (CUSTOM PROCEDURE TRAY) ×2 IMPLANT

## 2021-05-16 NOTE — Transfer of Care (Signed)
Immediate Anesthesia Transfer of Care Note ? ?Patient: Steven Vincent ? ?Procedure(s) Performed: IMPLANTATION OF HYPOGLOSSAL NERVE STIMULATOR (Right: Neck) ? ?Patient Location: PACU ? ?Anesthesia Type:General ? ?Level of Consciousness: drowsy ? ?Airway & Oxygen Therapy: Patient Spontanous Breathing and Patient connected to face mask oxygen ? ?Post-op Assessment: Report given to RN and Post -op Vital signs reviewed and stable ? ?Post vital signs: Reviewed and stable ? ?Last Vitals:  ?Vitals Value Taken Time  ?BP 127/94 05/16/21 1230  ?Temp 37.1 ?C 05/16/21 1145  ?Pulse 76 05/16/21 1239  ?Resp 12 05/16/21 1239  ?SpO2 94 % 05/16/21 1239  ?Vitals shown include unvalidated device data. ? ?Last Pain:  ?Vitals:  ? 05/16/21 1230  ?TempSrc:   ?PainSc: Asleep  ?   ? ?Patients Stated Pain Goal: 3 (05/16/21 1215) ? ?Complications: No notable events documented. ?

## 2021-05-16 NOTE — Anesthesia Postprocedure Evaluation (Signed)
Anesthesia Post Note ? ?Patient: Aaro Meyers ? ?Procedure(s) Performed: IMPLANTATION OF HYPOGLOSSAL NERVE STIMULATOR (Right: Neck) ? ?  ? ?Patient location during evaluation: PACU ?Anesthesia Type: General ?Level of consciousness: awake and alert ?Pain management: pain level controlled ?Vital Signs Assessment: post-procedure vital signs reviewed and stable ?Respiratory status: spontaneous breathing, nonlabored ventilation and respiratory function stable ?Cardiovascular status: blood pressure returned to baseline and stable ?Postop Assessment: no apparent nausea or vomiting ?Anesthetic complications: no ? ? ?No notable events documented. ? ?Last Vitals:  ?Vitals:  ? 05/16/21 1230 05/16/21 1330  ?BP: (!) 127/94 133/85  ?Pulse: 71 69  ?Resp: 11 11  ?Temp:  36.7 ?C  ?SpO2: 96% 93%  ?  ?Last Pain:  ?Vitals:  ? 05/16/21 1330  ?TempSrc:   ?PainSc: 0-No pain  ? ? ?  ?  ?  ?  ?  ?  ? ?Lidia Collum ? ? ? ? ?

## 2021-05-16 NOTE — H&P (Signed)
Steven Vincent is an 56 y.o. male.   ?Chief Complaint: Sleep apnea ?HPI: 56 year old male with obstructive sleep apnea who has been unable to tolerate CPAP. ? ?Past Medical History:  ?Diagnosis Date  ? Arthritis   ? Baker's cyst   ? Cancer ([place])   ? 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 Dysrhythmia   ? bradycardia  ? Headache(784.0)   ? HLD (hyperlipidemia)   ? Knee pain   ? Low back pain   ? Migraines   ? OSA (obstructive sleep apnea) 04/10/2017  ? Personality disorder ([place])   ? Seizures ([place])   ? ? ?Past Surgical History:  ?Procedure Laterality Date  ? COLONOSCOPY    ? normal  ? DRUG INDUCED ENDOSCOPY N/A 03/07/2021  ? Procedure: DRUG INDUCED SLEEP ENDOSCOPY;  Surgeon: Melida Quitter, MD;  Location: [facility];  Service: ENT;  Laterality: N/A;  ? TOOTH EXTRACTION  12/27/2014  ? lower left  ? TOTAL KNEE ARTHROPLASTY Left 11/28/2020  ? Procedure: LEFT KNEE PATELLAFEMORL REPLACEMENT;  Surgeon: Melrose Nakayama, MD;  [facility];  Service: Orthopedics;  Laterality: Left;  ? ? ?Family History  ?Problem Relation Age of Onset  ? Heart disease Father   ?     defibrilator  ? Hyperlipidemia Father   ? Hypertension Father   ? Alzheimer's disease Father   ? Other Father   ?     stents in legs  ? Aneurysm Mother   ?     brain  ? Hypertension Brother   ? Stroke Brother   ?     X2  ? ?Social History:  reports that he quit smoking about 23 years ago. His smoking use included cigarettes. He has a 1.00 pack-year smoking history. He has never used smokeless tobacco. He reports that he does not drink alcohol and does not use drugs. ? ?Allergies:  ?Allergies  ?Allergen Reactions  ? Delsym [Dextromethorphan] Other (See Comments)  ?  Gets loopy/memory loss  ? Aspirin Nausea And Vomiting  ?  GI upset ?  ? Mucinex D [Pseudoephedrine-Guaifenesin Er] Hypertension  ? Ibuprofen Other (See Comments)  ?  Stomach irritation  ? ? ?Medications Prior to Admission  ?Medication Sig Dispense Refill  ? Cholecalciferol (VITAMIN D) 50 MCG (2000  UT) tablet Take 2,000 Units by mouth daily.    ? dronedarone (MULTAQ) 400 MG tablet Take 1 tablet (400 mg total) by mouth 2 (two) times daily with a meal. 180 tablet 3  ? Erenumab-aooe (AIMOVIG) 140 MG/ML SOAJ Inject 140 mg into the skin every 30 (thirty) days. 3 mL 3  ? Melatonin 5 MG CAPS Take 5 mg by mouth at bedtime.    ? meloxicam (MOBIC) 15 MG tablet Take 15 mg by mouth daily.    ? pravastatin (PRAVACHOL) 20 MG tablet Take 1 tablet (20 mg total) by mouth daily. Schedule an appointment for further refills, 1st attempt 90 tablet 0  ? rOPINIRole (REQUIP) 0.5 MG tablet Take 1 tablet (0.5 mg total) by mouth at bedtime. 90 tablet 1  ? topiramate (TOPAMAX) 50 MG tablet Take one tablet in the AM and two tablets in the PM. 270 tablet 3  ? diclofenac (FLECTOR) 1.3 % PTCH Place 1 patch onto the skin daily as needed for pain.    ? diclofenac Sodium (VOLTAREN) 1 % GEL Apply 1 application. topically 4 (four) times daily as needed (pain).    ? SUMAtriptan 6 MG/0.5ML SOAJ INJECT 6 MG INTO THE SKIN  DAILY AS NEEDED AT MIGRAINE ONSET. MAY REPEAT 1 TIME AFTER 1 HOUR IF NEEDED 6 mL 1  ? tiZANidine (ZANAFLEX) 2 MG tablet Take 1 tablet (2 mg total) by mouth every 6 (six) hours as needed for muscle spasms. (Patient not taking: Reported on 05/10/2021) 30 tablet 1  ? ? ?Results for orders placed or performed during the hospital encounter of 05/16/21 (from the past 48 hour(s))  ?CBC     Status: None  ? Collection Time: 05/16/21  8:21 AM  ?Result Value Ref Range  ? WBC 7.2 4.0 - 10.5 K/uL  ? RBC 4.46 4.22 - 5.81 MIL/uL  ? Hemoglobin 14.8 13.0 - 17.0 g/dL  ? HCT 41.1 39.0 - 52.0 %  ? MCV 92.2 80.0 - 100.0 fL  ? MCH 33.2 26.0 - 34.0 pg  ? MCHC 36.0 30.0 - 36.0 g/dL  ? RDW 13.0 11.5 - 15.5 %  ? Platelets 176 150 - 400 K/uL  ? nRBC 0.0 0.0 - 0.2 %  ?  Comment: Performed at [facility], [place] [address], [place], [place] [address]  ? ?No results found. ? ?Review of Systems  ?All other systems reviewed and are negative. ? ?Blood pressure  138/82, pulse 68, temperature 97.8 ?F (36.6 ?C), temperature source Oral, resp. rate 18, height '6\' 2"'$  (1.88 m), weight 122.5 kg, SpO2 97 %. ?Physical Exam ?Constitutional:   ?   Appearance: Normal appearance.  ?HENT:  ?   Head: Normocephalic and atraumatic.  ?   Right Ear: External ear normal.  ?   Left Ear: External ear normal.  ?   Nose: Nose normal.  ?   Mouth/Throat:  ?   Mouth: Mucous membranes are moist.  ?   Pharynx: Oropharynx is clear.  ?Eyes:  ?   Extraocular Movements: Extraocular movements intact.  ?   Conjunctiva/sclera: Conjunctivae normal.  ?   Pupils: Pupils are equal, round, and reactive to light.  ?Cardiovascular:  ?   Rate and Rhythm: Normal rate.  ?Pulmonary:  ?   Effort: Pulmonary effort is normal.  ?Musculoskeletal:  ?   Cervical back: Normal range of motion.  ?Skin: ?   General: Skin is warm and dry.  ?Neurological:  ?   General: No focal deficit present.  ?   Mental Status: He is alert and oriented to person, place, and time.  ?Psychiatric:     ?   Mood and Affect: Mood normal.     ?   Behavior: Behavior normal.     ?   Thought Content: Thought content normal.     ?   Judgment: Judgment normal.  ?  ? ?Assessment/Plan ?Obstructive sleep apnea and BMI 34.67. ? ?To OR for hypoglossal nerve stimulator placement. ? ?Melida Quitter, MD ?05/16/2021, 8:53 AM ? ? ? ?

## 2021-05-16 NOTE — Op Note (Signed)
PREOPERATIVE DIAGNOSIS:  Obstructive sleep apnea. ?  ?POSTOPERATIVE DIAGNOSIS:  Obstructive sleep apnea. ?  ?PROCEDURE:  Placement of hypoglossal nerve stimulator including placement of sensor lead and testing of stimulator. ?  ?SURGEON:  Melida Quitter, MD ?  ?ASSISTANT:  RNFA ?  ?ANESTHESIA:  General endotracheal anesthesia. ?  ?COMPLICATIONS:  None. ?  ?INDICATIONS:  The patient is a 56 year old male with a history of obstructive sleep apnea who has not been able to tolerate CPAP.  He presents to the operating room for placement of hypoglossal nerve stimulator. ?  ?FINDINGS:  Surgical anatomy was unremarkable.  The device was tested intraoperatively and demonstrated excellent stimulation and sensor lead function. ?  ?DESCRIPTION OF PROCEDURE:  The patient was identified in the holding room, informed consent having been obtained, including discussion of risks, benefits and alternatives, the patient was brought to the operative suite and put on the operative table in  ?supine position.  Anesthesia was induced and the patient was intubated by the anesthesia team without difficulty.  The patient was given intravenous antibiotics during the case.  The eyes were taped closed and the bed was turned 180 degrees from  ?anesthesia and a shoulder roll was placed.  The right neck and right chest incisions were marked with a marking pen after measuring and injected with 1% lidocaine with 1:100,000 epinephrine.  The nerve integrity monitor was placed in the right lateral  ?tongue and floor of mouth and turned on during the case.  The right neck and chest were prepped and draped in sterile fashion.  The neck incision was made with a 15 blade scalpel and extended through subcutaneous tissue to the platysmal layer using Bovie ? electrocautery.  Dissection was then extended down the platysma layer somewhat until it was divided more inferiorly.  This allowed direct dissection down onto the lower portion of the submandibular gland and  ultimately the digastric tendon.  The tendon  ?was retracted inferiorly with two vessel loops.  Further dissection along the digastric exposed the mylohyoid muscle, which was then retracted anteriorly exposing the hypoglossal nerve.  The fascia over the nerve was then divided using bipolar  ?electrocautery to control bleeding.  The various branches of the nerve were then identified including the C1 branch and the more distal branches off of the main trunk.  Nerve stimulator was then used to identify which branches would be included as  ?protrusion branches and excluded as retrusion branches.  The break point between that on the superior surface of the nerve was then identified and the inclusion portion of the nerve was then elevated allowing pocket for cuff placement.  The cuff was then ? brought into the field and placed around the inclusion branches and properly positioned.  Saline was then injected under the cuff.  The anchor for this lead was then sutured to the digastric tendon using 3-0 silk suture in 2 positions.  The stimulating lead was  ?then fully placed in the neck and covered with a damp gauze.  The infraclavicular incision was made using a 15 blade scalpel and extended through the subcutaneous tissues using Bovie electrocautery down to the pectoralis fascia.  A pocket was then  ?created inferiorly for the generator.  2 stay sutures were then placed at the superior lateral extent of the wound using 2-0 silk suture and these were left in place.  Dissection was then extended through  ?the pectoralis major muscle overlying the second intercostal space.  Fatty tissue deep to the muscle was  swept exposing the external intercostal muscle.  This muscle was dissected more medially exposing the internal intercostal muscle.  A retractor was then gently placed between those muscles running posteriorly and the sensor lead was then placed into that pocket easily.  The anchor was then sutured with 3-0 silk suture in  3 positions.  The other anchor was then secured on the pectoralis muscle using 3-0 silk in 2 positions.  The neck incision was then uncovered and the lead unraveled.  The tunneling pocket was started with a hemostat under the platysma muscle and extended with the tunneling down over the clavicle to the generator pocket and the stimulating lead was then pulled into  ?the generator pocket with a little bit of slack left in the neck.  Both leads were then cleaned off with damp gauze and dried.  Each one was placed into the generator tightening down the screws to 2 clicks on both occasions.  Both leads were tugged and  ?found to be in good position.  The generator was then positioned into the generator pocket and testing then commenced.  After testing demonstrated good stimulation and good sensor lead function, each wound was copiously irrigated with saline.  The chest wound was closed in the deep tissues with 3-0 Vicryl suture in simple interrupted fashion and in the subcutaneous layer using 4-0 Vicryl suture in a simple interrupted fashion.  The neck incision was closed in the platysma layer with 3-0 Vicryl suture in a  ?simple interrupted fashion and then in the subcutaneous layer using 4-0 Vicryl suture in a simple interrupted fashion.  Both incisions were covered with Dermabond.  The patient was then cleaned off and drapes were removed.  The nerve integrity monitor  ?was removed.  Each incision was then covered with gauze pressure dressing.  The patient was then returned to anesthesia for wakeup and was extubated in the recovery room in stable condition. ? ?

## 2021-05-16 NOTE — Brief Op Note (Signed)
05/16/2021 ? ?11:30 AM ? ?PATIENT:  Steven Vincent  56 y.o. male ? ?PRE-OPERATIVE DIAGNOSIS:  Obstructive sleep apnea ? ?POST-OPERATIVE DIAGNOSIS:  Obstructive sleep apnea ? ?PROCEDURE:  Procedure(s): ?IMPLANTATION OF HYPOGLOSSAL NERVE STIMULATOR (Right) ? ?SURGEON:  Surgeon(s) and Role: ?   Melida Quitter, MD - Primary ? ?PHYSICIAN ASSISTANT:  ? ?ASSISTANTS: none  ? ?ANESTHESIA:   general ? ?EBL:  Minimal  ? ?BLOOD ADMINISTERED:none ? ?DRAINS: none  ? ?LOCAL MEDICATIONS USED:  LIDOCAINE  ? ?SPECIMEN:  No Specimen ? ?DISPOSITION OF SPECIMEN:  N/A ? ?COUNTS:  YES ? ?TOURNIQUET:  * No tourniquets in log * ? ?DICTATION: .Note written in EPIC ? ?PLAN OF CARE: Discharge to home after PACU ? ?PATIENT DISPOSITION:  PACU - hemodynamically stable. ?  ?Delay start of Pharmacological VTE agent (>24hrs) due to surgical blood loss or risk of bleeding: no ? ?

## 2021-05-16 NOTE — Anesthesia Procedure Notes (Signed)
Procedure Name: Intubation ?Date/Time: 05/16/2021 9:26 AM ?Performed by: Lieutenant Diego, CRNA ?Pre-anesthesia Checklist: Patient identified, Emergency Drugs available, Suction available and Patient being monitored ?Patient Re-evaluated:Patient Re-evaluated prior to induction ?Oxygen Delivery Method: Circle system utilized ?Preoxygenation: Pre-oxygenation with 100% oxygen ?Induction Type: IV induction ?Ventilation: Mask ventilation without difficulty ?Laryngoscope Size: Sabra Heck, 2 and Glidescope ?Grade View: Grade III ?Tube type: Oral ?Tube size: 8.0 mm ?Number of attempts: 2 ?Airway Equipment and Method: Stylet and Video-laryngoscopy ?Placement Confirmation: ETT inserted through vocal cords under direct vision, positive ETCO2 and breath sounds checked- equal and bilateral ?Secured at: 23 cm ?Tube secured with: Tape ?Dental Injury: Teeth and Oropharynx as per pre-operative assessment  ?Difficulty Due To: Difficulty was anticipated, Difficult Airway- due to large tongue, Difficult Airway- due to reduced neck mobility, Difficult Airway- due to anterior larynx and Difficult Airway- due to dentition ?Comments: DL with MIl 2, grade 3 view. Glide scope used second DL. ETT passes easy thru cords.  ? ? ? ? ?

## 2021-05-17 ENCOUNTER — Encounter (HOSPITAL_COMMUNITY): Payer: Self-pay | Admitting: Otolaryngology

## 2021-05-17 ENCOUNTER — Other Ambulatory Visit: Payer: Self-pay | Admitting: Neurology

## 2021-05-17 ENCOUNTER — Telehealth: Payer: Self-pay | Admitting: Pulmonary Disease

## 2021-05-18 NOTE — Telephone Encounter (Signed)
Called and spoke with patient to let him know that he would have to follow up and get cleared to go back to work but his ENT provider that put in the [place]. He expressed understanding and said he has an appointment with them on 3/30. He is now scheduled for follow up with our office on 4/20. ? ?PCC's Tammy was wondering if we need to coordinate with INSPIRE team for programming? ? ? ?

## 2021-05-18 NOTE — Telephone Encounter (Signed)
Called patient but he did not answer. Left message for him to call back.  

## 2021-05-18 NOTE — Telephone Encounter (Signed)
I think we discussed a 4 week follow up for Pulmonary .  ? ?He needs to keep recs from ENT for post op check and return to work information .  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  ? ?Will send to Dr. [name]  as well  ?Also do we need to coordinate with INSPIRE team for programming.  ? ? ?

## 2021-05-18 NOTE — Telephone Encounter (Signed)
Spoke to patient.  ?He stated that he had inspire device implanted 2 days ago. He is wanting to know when he should follow up with Dr. [name] and when he can return to work. He is a Administrator.  ?He also stated that he has had a headache for four days. He is wanting to know if it okay to take Imitrex injection.  ? ?Dr. [name], please advise. Thanks ? ? ?Tammy, please advise on Imitrex as Dr. [name] is currently unavailable.  ?

## 2021-05-19 NOTE — Telephone Encounter (Signed)
Inspire team needs to be present in office at the time of his next appointment.  Please coordinate with them. ?

## 2021-05-21 NOTE — Telephone Encounter (Signed)
PCC's can you guys assist with this? Thanks ?

## 2021-05-22 ENCOUNTER — Other Ambulatory Visit: Payer: Self-pay | Admitting: Neurology

## 2021-05-25 ENCOUNTER — Other Ambulatory Visit: Payer: Self-pay | Admitting: Neurology

## 2021-05-29 DIAGNOSIS — F819 Developmental disorder of scholastic skills, unspecified: Secondary | ICD-10-CM | POA: Diagnosis not present

## 2021-05-29 DIAGNOSIS — F2089 Other schizophrenia: Secondary | ICD-10-CM | POA: Diagnosis not present

## 2021-06-05 NOTE — Telephone Encounter (Signed)
Pt has been scheduled for 06/14/21.  ?

## 2021-06-14 ENCOUNTER — Encounter: Payer: Self-pay | Admitting: Adult Health

## 2021-06-14 ENCOUNTER — Ambulatory Visit (INDEPENDENT_AMBULATORY_CARE_PROVIDER_SITE_OTHER): Payer: Medicare HMO | Admitting: Adult Health

## 2021-06-14 DIAGNOSIS — G4733 Obstructive sleep apnea (adult) (pediatric): Secondary | ICD-10-CM | POA: Diagnosis not present

## 2021-06-14 NOTE — Progress Notes (Signed)
Reviewed and agree with assessment/plan. ? ? ?[name], MD ?[facility] ?06/14/2021, 11:37 AM ?Pager:  [phone] ? ?

## 2021-06-14 NOTE — Progress Notes (Signed)
? ? ? ?'@Patient'$  ID: Steven Vincent, male    DOB: Jan 24, 1966, 56 y.o.   MRN: [id number] ? ?Chief Complaint  ?Patient presents with  ? Follow-up  ? ? ?Referring provider: ?[place], Betty G, MD ? ?HPI: ?56 yo male followed for OSA and Insomnia .  ?Medical history significant for Seizure disorder  ?Special olympic athlete, GOLD medal .  ? ?TEST/EVENTS :  ?PSG 02/15/17 >> AHI 6.9, SpO2 low 85%.  Supine AHI 10.5, REM AHI 24.3. ?HST 02/26/18 >> AHI 11.6, SpO2 low 79% ?HST 07/07/18 >> AHI 7.7, SpO2 low 83% ?Auto CPAP 11/20/19 to 12/19/19 >> used on 12 of 30 nights with average 2 hrs 9 min.  Average AHI 4.3 with median CPAP 11 and 95 th percentile CPAP 14 cm H2O ?CPAP titration 01/31/20 >> CPAP 13 >> AHI 0, no REM. ?  ?Cardiac Tests:  ?Echo 04/28/19 >> EF 60 to 65%, grade 1 DD, ascending aorta 40 mm ? ?06/14/2021 Follow up : OSA Dawna Part  ?Patient returns for follow up of sleep apnea.  Patient was last seen August 2022.  Patient has been unable to tolerate CPAP.  He has significant symptom burden with daytime sleepiness and fatigue.  Patient was referred to ENT for evaluation of inspire device.  Patient was evaluated by Dr. Redmond Baseman in ENT.  Patient went through the work-up for inspire and was approved.  He underwent hypoglossal nerve stimulator placement May 16, 2021.  Patient says he did very well with surgery.  He says his incisions have done very well.  He has had no redness or significant swelling.  He was seen back in the office with Dr. Redmond Baseman on May 24, 2021.  ENT notes were reviewed on care everywhere.  Patient was felt to be doing very well.  He says his incisions have healed up very nicely.  He is here today for his inspire activation. ?We are accompanied on visit today with inspire team representative Gerald Stabs.  Patient education was given to patient regarding his inspire device remote control activation and ramp up.  Patient did very well during return demonstration.  His remote control was activated and sent to his  phone/Inspire APP .  ?As below clinical exam was unrevealing.  Tongue was midline and no evidence of tongue weakness or deviation.  Stimulation level sensation was 0.3 V functional level was 0.4 V. ?Patient typically goes to bed about 7 PM gets up about 7 AM.  He will have an 8-hour duration.  A 10-minute snooze and a 30-minute start delay.  We completed a 3-minute stimulation test.  Patient tolerated well.  Waveform appeared normal. ?Patient is very excited to get started.  He says he has significant sleepiness and cannot wait for that to get better. ? ?Patient does have some intellectual disability reads and writes at a third grade level.  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  Patient was able to completely go through the set up very well and was well versed in his remote control. ? ?Allergies  ?Allergen Reactions  ? Delsym [Dextromethorphan] Other (See Comments)  ?  Gets loopy/memory loss  ? Aspirin Nausea And Vomiting  ?  GI upset ?  ? Mucinex D [Pseudoephedrine-Guaifenesin Er] Hypertension  ? Ibuprofen Other (See Comments)  ?  Stomach irritation  ? ? ?Immunization History  ?Administered Date(s) Administered  ? Influenza Split 12/03/2010  ? Influenza Whole 12/04/2006, 11/17/2007, 01/11/2009  ? Influenza,inj,Quad PF,6+ Mos 10/25/2018  ?  Td 12/04/2006  ? ? ?Past Medical History:  ?Diagnosis Date  ? Arthritis   ? Baker's cyst   ? Cancer ([place])   ? 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 Dysrhythmia   ? bradycardia  ? Headache(784.0)   ? HLD (hyperlipidemia)   ? Knee pain   ? Low back pain   ? Migraines   ? OSA (obstructive sleep apnea) 04/10/2017  ? Personality disorder ([place])   ? Seizures ([place])   ? ? ?Tobacco History: ?Social History  ? ?Tobacco Use  ?Smoking Status Former  ? Packs/day: 1.00  ? Years: 1.00  ? Pack years: 1.00  ? Types: Cigarettes  ? Quit date: 2000  ? Years since quitting: 23.3  ?Smokeless Tobacco Never  ?Tobacco Comments  ? states he only tried as a teenager  ? ?Counseling  given: Not Answered ?Tobacco comments: states he only tried as a teenager ? ? ?Outpatient Medications Prior to Visit  ?Medication Sig Dispense Refill  ? Cholecalciferol (VITAMIN D) 50 MCG (2000 UT) tablet Take 2,000 Units by mouth daily.    ? diclofenac (FLECTOR) 1.3 % PTCH Place 1 patch onto the skin daily as needed for pain.    ? diclofenac Sodium (VOLTAREN) 1 % GEL Apply 1 application. topically 4 (four) times daily as needed (pain).    ? dronedarone (MULTAQ) 400 MG tablet Take 1 tablet (400 mg total) by mouth 2 (two) times daily with a meal. 180 tablet 3  ? Erenumab-aooe (AIMOVIG) 140 MG/ML SOAJ Inject 140 mg into the skin every 30 (thirty) days. 3 mL 3  ? Melatonin 5 MG CAPS Take 5 mg by mouth at bedtime.    ? meloxicam (MOBIC) 15 MG tablet Take 15 mg by mouth daily.    ? pravastatin (PRAVACHOL) 20 MG tablet Take 1 tablet (20 mg total) by mouth daily. Schedule an appointment for further refills, 1st attempt 90 tablet 0  ? rOPINIRole (REQUIP) 0.5 MG tablet Take 1 tablet (0.5 mg total) by mouth at bedtime. 90 tablet 1  ? SUMAtriptan 6 MG/0.5ML SOAJ INJECT 6 MG INTO THE SKIN DAILY AS NEEDED AT MIGRAINE ONSET. MAY REPEAT 1 TIME AFTER 1 HOUR IF NEEDED 6 mL 11  ? tiZANidine (ZANAFLEX) 2 MG tablet Take 1 tablet (2 mg total) by mouth every 6 (six) hours as needed for muscle spasms. 30 tablet 1  ? topiramate (TOPAMAX) 50 MG tablet Take one tablet in the AM and two tablets in the PM. 270 tablet 3  ? HYDROcodone-acetaminophen (NORCO) 7.5-325 MG tablet Take 1 tablet by mouth every 6 (six) hours as needed for moderate pain. (Patient not taking: Reported on 06/14/2021) 20 tablet 0  ? ?No facility-administered medications prior to visit.  ? ? ? ?Review of Systems:  ? ?Constitutional:   No  weight loss, night sweats,  Fevers, chills,  ?+fatigue, or  lassitude. ? ?HEENT:   No headaches,  Difficulty swallowing,  Tooth/dental problems, or  Sore throat,  ?              No sneezing, itching, ear ache, nasal congestion, post nasal  drip,  ? ?CV:  No chest pain,  Orthopnea, PND, swelling in lower extremities, anasarca, dizziness, palpitations, syncope.  ? ?GI  No heartburn, indigestion, abdominal pain, nausea, vomiting, diarrhea, change in bowel habits, loss of appetite, bloody stools.  ? ?Resp: No shortness of breath with exertion or at rest.  No excess mucus, no productive cough,  No non-productive cough,  No coughing up of  blood.  No change in color of mucus.  No wheezing.  No chest wall deformity ? ?Skin: no rash or lesions. ? ?GU: no dysuria, change in color of urine, no urgency or frequency.  No flank pain, no hematuria  ? ?MS:  No joint pain or swelling.  No decreased range of motion.  No back pain. ? ? ? ?Physical Exam ? ?BP 122/74 (BP Location: Left Arm, Patient Position: Sitting, Cuff Size: Large)   Pulse 60   Temp 97.8 ?F (36.6 ?C) (Oral)   Ht '6\' 2"'$  (1.88 m)   Wt 284 lb (128.8 kg)   SpO2 99%   BMI 36.46 kg/m?  ? ?GEN: A/Ox3; pleasant , NAD, well nourished  ?  ?HEENT:  [place]/AT,  NOSE-clear, THROAT-clear, no lesions, no postnasal drip or exudate noted. Class 3 MP airway . Tongue midline , no deviation or fasiculations noted.  ? ?NECK:  Supple w/ fair ROM; no JVD; normal carotid impulses w/o bruits; no thyromegaly or nodules palpated; no lymphadenopathy.   ? ?RESP  Clear  P & A; w/o, wheezes/ rales/ or rhonchi. no accessory muscle use, no dullness to percussion ? ?CARD:  RRR, no m/r/g, tr  peripheral edema, pulses intact, no cyanosis or clubbing. ? ?GI:   Soft & nt; nml bowel sounds; no organomegaly or masses detected.  ? ?Musco: Warm bil, no deformities or joint swelling noted.  ? ?Neuro: alert, no focal deficits noted.   ? ?Skin: Warm, no lesions or rashes, INSPIRE implant right upper chest wall incision pink and intact , clean , no redness or swelling . Along right upper neck/submandibular incision pink and intact , clean , no redness  ? ?INSPIRE activation done with w/ Dawna Part Rep , Gerald Stabs . Patient with return demonstration with  remote .  ? ? ? ? ? ? ? ? ?ProBNP ?No results found for: PROBNP ? ?Imaging: ?DG Neck Soft Tissue ? ?Result Date: 05/16/2021 ?CLINICAL DATA:  Obstructive sleep apnea EXAM: NECK SOFT TISSUES - 1+ VIEW COMPARISON

## 2021-06-14 NOTE — Assessment & Plan Note (Signed)
Obstructive sleep apnea with significant symptom burden.  Patient was unable to tolerate CPAP.  He has underwent the inspire work-up and is now received his inspire implant (May 16, 2021).  Incision sites appear very good.  No signs of infection.  Patient has completed his activation today.  Inspire team representative, Gerald Stabs assisted with activation, linking of his remote control with excellent return demonstration. ? ?Stimulation level sensation was 0.3 V, functional level 0.4 V.  Patient is on a start delay at 30 minutes, pulse time of 10 minutes and a duration of 8 hours. ?Patient is aware to increase by 1 level each week.  Patient is aware to decrease if he starts to have significant discomfort ?Extensive education was given.  Patient appears to be understanding well.  Patient has been given paperwork with callback numbers to call our office if he has any issues. ?He will receive a phone call in 2 to 4 weeks for a checkup.  He will return to our office in 10 weeks for a follow-up visit ?He has been set up for a inspire titration study in 12 weeks. ? ?Plan  ?Patient Instructions  ?Call if you have any redness or swelling at incision sites , if you have severe tongue/jaw pain.  ?Adjust Inspire device as discussed. Remember to go up each week by 1 level only .  ?Call if you have any questions.  ?Earnest Bailey will call you in 2-4 weeks for a check up .  ?Follow up in 10 weeks in office and As needed   ? ? ?  ? ?

## 2021-06-14 NOTE — Patient Instructions (Signed)
Call if you have any redness or swelling at incision sites , if you have severe tongue/jaw pain.  ?Adjust Inspire device as discussed. Remember to go up each week by 1 level only .  ?Call if you have any questions.  ?Steven Vincent will call you in 2-4 weeks for a check up .  ?Follow up in 10 weeks in office and As needed   ? ? ?

## 2021-06-18 ENCOUNTER — Telehealth: Payer: Self-pay | Admitting: Adult Health

## 2021-06-19 NOTE — Telephone Encounter (Signed)
Spoke to pt & made him aware we are getting daily reports for his inspire.  Pt verbalized understanding & stated nothing further needed at this time.  ?

## 2021-06-19 NOTE — Telephone Encounter (Signed)
Called and spoke with patient. He stated that his Inspire device did not record any data last night. He stated that he slept for 8 hours but the app on his phone is not showing any data. The last data saved was for Sunday night 06/17/21. He stated that he has had problems with his phone before in the past and is not sure if the phone is not working properly.  ? ?Earnest Bailey, can you please advise? Thanks!  ?

## 2021-06-19 NOTE — Telephone Encounter (Signed)
LM on pt's VM to discuss issues w/ the inspire device.  I also let pt know that, yes, we were able to pull date fro [employer] for last night.  ?

## 2021-06-28 DIAGNOSIS — H52209 Unspecified astigmatism, unspecified eye: Secondary | ICD-10-CM | POA: Diagnosis not present

## 2021-06-28 DIAGNOSIS — H524 Presbyopia: Secondary | ICD-10-CM | POA: Diagnosis not present

## 2021-06-28 DIAGNOSIS — F2089 Other schizophrenia: Secondary | ICD-10-CM | POA: Diagnosis not present

## 2021-07-13 ENCOUNTER — Other Ambulatory Visit: Payer: Self-pay | Admitting: Cardiology

## 2021-07-13 ENCOUNTER — Encounter: Payer: Self-pay | Admitting: Adult Health

## 2021-07-13 ENCOUNTER — Ambulatory Visit (INDEPENDENT_AMBULATORY_CARE_PROVIDER_SITE_OTHER): Payer: Medicare HMO | Admitting: Adult Health

## 2021-07-13 VITALS — BP 128/80 | HR 59 | Temp 98.0°F | Ht 74.0 in | Wt 284.8 lb

## 2021-07-13 DIAGNOSIS — E782 Mixed hyperlipidemia: Secondary | ICD-10-CM

## 2021-07-13 DIAGNOSIS — G4733 Obstructive sleep apnea (adult) (pediatric): Secondary | ICD-10-CM

## 2021-07-13 NOTE — Progress Notes (Signed)
$'@Patient'x$  ID: Steven Vincent, male    DOB: Dec 16, 1965, 56 y.o.   MRN: [id number]  Chief Complaint  Patient presents with   Follow-up    Referring provider: [place], Betty G, MD  HPI: 56 yo followed for obstructive sleep apnea and insomnia s/p INSPIRE device placement  Medical history significant for seizure disorder Patient is a special Olympic athlete, gold medal  (Patient does have some intellectual disability reads and writes at a third grade level.  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  Supine AHI 10.5, REM AHI 24.3. HST 02/26/18 >> AHI 11.6, SpO2 low 79% HST 07/07/18 >> AHI 7.7, SpO2 low 83% Auto CPAP 11/20/19 to 12/19/19 >> used on 12 of 30 nights with average 2 hrs 9 min.  Average AHI 4.3 with median CPAP 11 and 95 th percentile CPAP 14 cm H2O CPAP titration 01/31/20 >> CPAP 13 >> AHI 0, no REM.   Cardiac Tests:  Echo 04/28/19 >> EF 60 to 65%, grade 1 DD, ascending aorta 40 mm   07/13/2021 Follow up : OSA/Inspire device  Patient returns for a 1 month follow-up.  Patient has underlying obstructive sleep apnea with CPAP intolerance.  He has significant symptom burden with daytime sleepiness and fatigue.  Patient underwent inspire device work-up.  He underwent hypoglossal nerve stimulator placement May 16, 2021.  Patient did well with surgery with no known postop complications.  Patient was seen in the office last time with device activation with ramp up initiation.   Patient typically goes to bed about 7 PM gets up about 7 AM.  He will have an 8-hour duration.  A 10-minute snooze and a 30-minute start delay.   Stimulation level sensation was 0.3 V, functional level 0.4 V.  Patient is on a start delay at 30 minutes, pulse time of 10 minutes and a duration of 8 hours. increase by 1 level each week.  Since last visit patient says he is doing very well.  He is went up by 1 level each  week. Currently on level 4.  Has tolerated well.  Says he feels comfortable with no pain.  Has no significant tongue issues tremors or jaw pain.  Patient says he does feel that he is sleeping better and is more rested.  Download shows excellent usage with 100% usage.  Daily average usage between 6 to 8 hours each night. Stimulation test today in the office appears normal.  Patient says he continues to have ongoing housing issues.  Currently living in a garage.  Sleeps in a recliner.  Is awaiting housing placement.   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   Dysrhythmia    bradycardia   Headache(784.0)    HLD (hyperlipidemia)    Knee pain    Low back pain  Migraines    OSA (obstructive sleep apnea) 04/10/2017   Personality disorder (HCC)    Seizures (HCC)     Tobacco History: Social History   Tobacco Use  Smoking Status Former   Packs/day: 1.00   Years: 1.00   Pack years: 1.00   Types: Cigarettes   Quit date: 2000   Years since quitting: 23.3  Smokeless Tobacco Never  Tobacco Comments   states he only tried as a teenager   Counseling given: Not Answered Tobacco comments: states he only tried as a teenager   Outpatient Medications Prior to Visit  Medication Sig Dispense Refill   Cholecalciferol (VITAMIN D) 50 MCG (2000 UT) tablet Take 2,000 Units by mouth daily.     diclofenac (FLECTOR) 1.3 % PTCH Place 1 patch onto the skin daily as  needed for pain.     diclofenac Sodium (VOLTAREN) 1 % GEL Apply 1 application. topically 4 (four) times daily as needed (pain).     dronedarone (MULTAQ) 400 MG tablet Take 1 tablet (400 mg total) by mouth 2 (two) times daily with a meal. 180 tablet 3   Erenumab-aooe (AIMOVIG) 140 MG/ML SOAJ Inject 140 mg into the skin every 30 (thirty) days. 3 mL 3   Melatonin 5 MG CAPS Take 5 mg by mouth at bedtime.     meloxicam (MOBIC) 15 MG tablet Take 15 mg by mouth daily.     rOPINIRole (REQUIP) 0.5 MG tablet Take 1 tablet (0.5 mg total) by mouth at bedtime. 90 tablet 1   SUMAtriptan 6 MG/0.5ML SOAJ INJECT 6 MG INTO THE SKIN DAILY AS NEEDED AT MIGRAINE ONSET. MAY REPEAT 1 TIME AFTER 1 HOUR IF NEEDED 6 mL 11   tiZANidine (ZANAFLEX) 2 MG tablet Take 1 tablet (2 mg total) by mouth every 6 (six) hours as needed for muscle spasms. 30 tablet 1   topiramate (TOPAMAX) 50 MG tablet Take one tablet in the AM and two tablets in the PM. 270 tablet 3   pravastatin (PRAVACHOL) 20 MG tablet Take 1 tablet (20 mg total) by mouth daily. Schedule an appointment for further refills, 1st attempt 90 tablet 0   No facility-administered medications prior to visit.     Review of Systems:   Constitutional:   No  weight loss, night sweats,  Fevers, chills, fatigue, or  lassitude.  HEENT:   No headaches,  Difficulty swallowing,  Tooth/dental problems, or  Sore throat,                No sneezing, itching, ear ache, nasal congestion, post nasal drip,   CV:  No chest pain,  Orthopnea, PND, swelling in lower extremities, anasarca, dizziness, palpitations, syncope.   GI  No heartburn, indigestion, abdominal pain, nausea, vomiting, diarrhea, change in bowel habits, loss of appetite, bloody stools.   Resp: No shortness of breath with exertion or at rest.  No excess mucus, no productive cough,  No non-productive cough,  No coughing up of blood.  No change in color of mucus.  No wheezing.  No chest wall deformity  Skin: no rash or  lesions.  GU: no dysuria, change in color of urine, no urgency or frequency.  No flank pain, no hematuria   MS:  No joint pain or swelling.  No decreased range of motion.  No back pain.    Physical Exam  BP 128/80 (BP Location: Left Arm, Patient Position: Sitting, Cuff Size: Large)   Pulse (!) 59   Temp 98 F (36.7 C) (Oral)  Ht '6\' 2"'$  (1.88 m)   Wt 284 lb 12.8 oz (129.2 kg)   SpO2 99%   BMI 36.57 kg/m   GEN: A/Ox3; pleasant , NAD, well nourished    HEENT:  [place]/AT,   NOSE-clear, THROAT-clear, no lesions, no postnasal drip or exudate noted. Class 3 MP airway , no fasiculations noted. Stimulation test shows tongue deviation to left then midline    NECK:  Supple w/ fair ROM; no JVD; normal carotid impulses w/o bruits; no thyromegaly or nodules palpated; no lymphadenopathy.    RESP  Clear  P & A; w/o, wheezes/ rales/ or rhonchi. no accessory muscle use, no dullness to percussion  CARD:  RRR, no m/r/g, no peripheral edema, pulses intact, no cyanosis or clubbing.  GI:   Soft & nt; nml bowel sounds; no organomegaly or masses detected.   Musco: Warm bil, no deformities or joint swelling noted.   Neuro: alert, no focal deficits noted.    Skin: Warm, no lesions or rashes, previous incision sites along neck and right upper chest wall appear well healed with no redness.       BMET 1.  No other was Ais no  BNP    Component Value Date/Time   BNP 39.2 01/29/2021 1141    ProBNP No results found for: PROBNP  Imaging: No results found.        View : No data to display.          No results found for: NITRICOXIDE      Assessment & Plan:   OSA (obstructive sleep apnea) Obstructive sleep apnea with significant symptom burden.  CPAP intolerant.  Now status post inspire device-implanted May 16, 2021.  Device activation and ramp up initiation June 14, 2021. Patient has done very well over the last 4 weeks.  Now on level 4.  Has tolerated weekly increases without  significant difficulty.  Has improved symptom control. Stimulation test in the office today appears normal. Patient will return back here in 4 weeks.  At that time we will set up for a inspire in lab titration study.  Plan  Patient Instructions  Call if you have severe tongue/jaw pain.  Adjust Inspire device as discussed. Can go up each week by 1 level only , once you reach the level you are comfortable and sleeping well, may stop at this level.  Call if you have any questions.  Will set up sleep study on return visit .  Follow up in 1 month and As needed         I spent 32   minutes dedicated to the care of this patient on the date of this encounter to include pre-visit review of records, face-to-face time with the patient discussing conditions above, post visit ordering of testing, clinical documentation with the electronic health record, making appropriate referrals as documented, and communicating necessary findings to members of the patients care team.   Rexene Edison, NP 07/13/2021

## 2021-07-13 NOTE — Progress Notes (Signed)
Reviewed and agree with assessment/plan.   Orvile Corona, MD [facility] 07/13/2021, 1:25 PM Pager:  [phone]  

## 2021-07-13 NOTE — Patient Instructions (Addendum)
Call if you have severe tongue/jaw pain.  Adjust Inspire device as discussed. Can go up each week by 1 level only , once you reach the level you are comfortable and sleeping well, may stop at this level.  Call if you have any questions.  Will set up sleep study on return visit .  Follow up in 1 month and As needed

## 2021-07-13 NOTE — Assessment & Plan Note (Signed)
Obstructive sleep apnea with significant symptom burden.  CPAP intolerant.  Now status post inspire device-implanted May 16, 2021.  Device activation and ramp up initiation June 14, 2021. Patient has done very well over the last 4 weeks.  Now on level 4.  Has tolerated weekly increases without significant difficulty.  Has improved symptom control. Stimulation test in the office today appears normal. Patient will return back here in 4 weeks.  At that time we will set up for a inspire in lab titration study.  Plan  Patient Instructions  Call if you have severe tongue/jaw pain.  Adjust Inspire device as discussed. Can go up each week by 1 level only , once you reach the level you are comfortable and sleeping well, may stop at this level.  Call if you have any questions.  Will set up sleep study on return visit .  Follow up in 1 month and As needed

## 2021-07-18 NOTE — Progress Notes (Unsigned)
Cardiology Office Note:    Date:  07/24/2021   ID:  Steven Vincent, DOB [birth year], MRN [id number]  PCP:  [place], Betty G, MD   [facility] Providers Cardiologist:  Minus Breeding, MD Electrophysiologist:  Will Meredith Leeds, MD { Referring MD: [place], Betty G, MD   Chief Complaint  Patient presents with   Follow-up    Chest pain    History of Present Illness:    Steven Vincent is a 56 y.o. male with a hx of PVCs, bradycardia, normal POET January 2018, hyperlipidemia, seizures, and OSA.  Heart monitor October 2018 showed frequent PVCs with a burden of 16%.  He was bradycardic to 40 while sleeping.  Beta-blocker was reduced and eventually stopped.  Echocardiogram with LVEF 60 to 15%, grade 1 diastolic dysfunction, and no significant valvular disease.  Repeat heart monitor 04/2019 with a reduced PVC burden of 8.2%.  Nuclear stress test 02/09/2020 for atypical chest pain revealed a small fixed apical defect but otherwise normal.  No further work-up was recommended.  He eventually started Multaq for PVCs.  He is unable to use CPAP for OSA and previously was considered for hypoglossal nerve stimulator.  He was last seen by Dr. Percival Spanish 01/29/2021 and reported fatigue and shortness of breath.  He has had work-up for both and was largely unrevealing.  He had a repeat POET that was nonischemic on 01/30/2021.  He was referred to pulmonology for further work-up of his shortness of breath.  Dr. Percival Spanish felt the fatigue was related to his untreated sleep apnea.  Hypoglossal nerve stimulator placed 05/16/2021.  He presents today for routine follow-up. He is frustrated with the operation and blue tooth connectivity. He also is frustrated that the blue tooth on his hearing aid isn't working. Has been told he needs a new phone.  He is a Camera operator for softball and basketball. He also tells me he has a book coming out and drew the poison control sticker.   He got a new job at [employer] but  doesn't work there anymore. He started having substernal chest pain for the last 90 days. CP occurs daily with activity and improves with rest. He thinks its due to stress. CP lasts 15-30 minutes with radiation to his shoulders. He is somewhat difficult historian. His last day at work was last Friday and the CP may be improving. He denies nausea, but reports diaphoresis with chest pain. He says that "they pushed me."  He denies shortness of breath.  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   Dysrhythmia    bradycardia   Headache(784.0)    HLD (hyperlipidemia)    Knee pain    Low back pain    Migraines    OSA (obstructive sleep apnea) 04/10/2017   Personality disorder ([place])    Seizures ([place])     Past Surgical History:  Procedure Laterality Date   COLONOSCOPY     normal   DRUG INDUCED ENDOSCOPY N/A 03/07/2021   Procedure: DRUG INDUCED SLEEP ENDOSCOPY;  Surgeon: Melida Quitter, MD;  Location: [facility];  Service: ENT;  Laterality: N/A;   IMPLANTATION OF HYPOGLOSSAL NERVE STIMULATOR Right 05/16/2021   Procedure: IMPLANTATION OF HYPOGLOSSAL NERVE STIMULATOR;  Surgeon: Melida Quitter, MD;  Location: [facility];  Service: ENT;  Laterality: Right;   TOOTH EXTRACTION  12/27/2014   lower left   TOTAL KNEE ARTHROPLASTY  Left 11/28/2020   Procedure: LEFT KNEE PATELLAFEMORL REPLACEMENT;  Surgeon: Melrose Nakayama, MD;  Location: [facility];  Service: Orthopedics;  Laterality: Left;    Current Medications: Current Meds  Medication Sig   Cholecalciferol (VITAMIN D) 50 MCG (2000 UT) tablet Take 2,000 Units by mouth daily.   diclofenac (FLECTOR) 1.3 % PTCH Place 1 patch onto the skin daily as needed for pain.   diclofenac Sodium (VOLTAREN) 1 % GEL Apply 1 application. topically 4 (four) times daily as needed (pain).   dronedarone (MULTAQ) 400 MG tablet Take 1 tablet (400 mg total) by mouth 2 (two) times daily with a meal.    Erenumab-aooe (AIMOVIG) 140 MG/ML SOAJ Inject 140 mg into the skin every 30 (thirty) days.   Melatonin 5 MG CAPS Take 5 mg by mouth at bedtime.   meloxicam (MOBIC) 15 MG tablet Take 15 mg by mouth daily.   nitroGLYCERIN (NITROSTAT) 0.4 MG SL tablet Place 1 tablet (0.4 mg total) under the tongue every 5 (five) minutes as needed for chest pain.   pravastatin (PRAVACHOL) 20 MG tablet Take 1 tablet (20 mg total) by mouth daily. Keep scheduled appointment for further refills   rOPINIRole (REQUIP) 0.5 MG tablet Take 1 tablet (0.5 mg total) by mouth at bedtime.   SUMAtriptan 6 MG/0.5ML SOAJ INJECT 6 MG INTO THE SKIN DAILY AS NEEDED AT MIGRAINE ONSET. MAY REPEAT 1 TIME AFTER 1 HOUR IF NEEDED   tiZANidine (ZANAFLEX) 2 MG tablet Take 1 tablet (2 mg total) by mouth every 6 (six) hours as needed for muscle spasms.   topiramate (TOPAMAX) 50 MG tablet Take one tablet in the AM and two tablets in the PM.     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 Wife of 16 years left him about 2 years ago, and pt. has had difficulty adjusting.   Says his brother Francee Piccolo, who lives in [place], is his main contact, source of  support, as well as his dog.    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 States he uses painting as additional therapy.    Social Determinants of Health   Financial Resource Strain: Medium Risk   Difficulty of Paying Living Expenses: Somewhat hard  Food Insecurity: Food Insecurity Present   Worried About Charity fundraiser in the Last Year: Often true   Ran Out of Food in the Last Year: Sometimes true  Transportation Needs: No Transportation Needs   Lack  of Transportation (Medical): No   Lack of Transportation (Non-Medical): No  Physical Activity: Sufficiently Active   Days of Exercise per Week: 3 days   Minutes of Exercise per Session: 120 min  Stress: No Stress Concern Present   Feeling of Stress : Not at all  Social Connections: Moderately Integrated   Frequency of Communication with Friends and Family: More than three times a week   Frequency of Social Gatherings with Friends and Family: More than three times a week   Attends Religious Services: More than 4 times per year   Active Member of [employer] or Organizations: Yes   Attends Music therapist: More than 4 times per year   Marital Status: Divorced     Family History: The patient's family history includes Alzheimer's disease in his father; Aneurysm in his mother; Heart disease in his father; Hyperlipidemia in his father; Hypertension in his brother and father; Other in his father; Stroke in his brother.  ROS:   Please see the history of present illness.     All other systems reviewed and are negative.  EKGs/Labs/Other Studies Reviewed:    The following studies were reviewed today:  POET 01/30/21: He had a poor exercise tolerance.  However, he did not have inducible arrhythmia.  He did have frequent PVCs.  This was not an adequate heart rate but I did not see high risk findings and I am not suggesting further cardiac  testing  EKG:  EKG is  ordered today.  The ekg ordered today demonstrates sinus bradycardia with HR 55  Recent Labs: 08/04/2020: ALT 23 01/29/2021: BNP 39.2; BUN 18; Creatinine, Ser 0.86; Magnesium 2.0; Potassium 4.6; Sodium 142 05/16/2021: Hemoglobin 14.8; Platelets 176  Recent Lipid Panel    Component Value Date/Time   CHOL 146 08/04/2020 0953   TRIG 119.0 08/04/2020 0953   TRIG 231 (HH) 12/27/2005 1005   HDL 38.10 (L) 08/04/2020 0953   CHOLHDL 4 08/04/2020 0953   VLDL 23.8 08/04/2020 0953   LDLCALC 84 08/04/2020 0953   LDLDIRECT 70.0 07/07/2018 0833     Risk Assessment/Calculations:           Physical Exam:    VS:  BP 130/82   Pulse 63   Ht '6\' 2"'$  (1.88 m)   Wt 282 lb 9.6 oz (128.2 kg)   SpO2 (!) 63%   BMI 36.28 kg/m     Wt Readings from Last 3 Encounters:  07/24/21 282 lb 9.6 oz (128.2 kg)  07/13/21 284 lb 12.8 oz (129.2 kg)  06/14/21 284 lb (128.8 kg)     GEN:  Well nourished, well developed in no acute distress HEENT: Normal NECK: No JVD; No carotid bruits LYMPHATICS: No lymphadenopathy CARDIAC: RRR, no murmurs, rubs, gallops RESPIRATORY:  Clear to auscultation without rales, wheezing or rhonchi  ABDOMEN: Soft, non-tender, non-distended MUSCULOSKELETAL:  No edema; No deformity  SKIN: Warm and dry NEUROLOGIC:  Alert and oriented x 3 PSYCHIATRIC:  Normal affect   ASSESSMENT:    1. Chest pain, unspecified type   2. PVC (premature ventricular contraction)   3. OSA (obstructive sleep apnea)    PLAN:    In order of problems listed above:  Chest pain Previous POET and nuclear stress testing nonischemic. Felt prior chest pain was not cardiac.  He describes chest pain concerning for angina - exertional, relieved with rest with diaphoresis CP described as a chest pressure. EKG today nonischemic Recommend CT coronary, no BB needed, HR 55 Will give PRN nitro SL  Frequent PVCs Continue Multaq Will check BMP and Mg    OSA Intolerant to  CPAP Referred for hypoglossal nerve stimulator -this was placed 05/16/2021.   Follow up with Dr. Percival Spanish in 6 months.     Medication Adjustments/Labs and Tests Ordered: Current medicines are reviewed at length with the patient today.  Concerns regarding medicines are outlined above.  Orders Placed This Encounter  Procedures   CT CORONARY MORPH W/CTA COR W/SCORE W/CA W/CM &/OR WO/CM   Basic metabolic panel   Magnesium   EKG 12-Lead   Meds ordered this encounter  Medications   nitroGLYCERIN (NITROSTAT) 0.4 MG SL tablet    Sig: Place 1 tablet (0.4 mg total) under the tongue every 5 (five) minutes as needed for chest pain.    Dispense:  25 tablet    Refill:  3    Patient Instructions  Medication Instructions:  No Changes *If you need a refill on your cardiac medications before your next appointment, please call your pharmacy*   Lab Work: BMET, Magnesium. Today If you have labs (blood work) drawn today and your tests are completely normal, you will receive your results only by: [place] (if you have MyChart) OR A paper copy in the mail If you have any lab test that is abnormal or we need to change your treatment, we will call you to review the results.   Testing/Procedures:   Your cardiac CT will be scheduled at one of the below locations:   [facility] [address] [place], [place] [address] [phone]  If scheduled at [facility], please arrive at the [facility] and Children's Entrance (Entrance C2) of [facility] 30 minutes prior to test start time. You can use the FREE valet parking offered at entrance C (encouraged to control the heart rate for the test)  Proceed to the [facility] Radiology Department (first floor) to check-in and test prep.  All radiology patients and guests should use entrance C2 at [facility], accessed from [facility], even though the hospital's physical address listed is [address].    If scheduled at [facility], please arrive 15 mins early for check-in and test prep.  Please follow these instructions carefully (unless otherwise directed):  Hold all erectile dysfunction medications at least 3 days (72 hrs) prior to test.  On the Night Before the Test: Be sure to Drink plenty of water. Do not consume any caffeinated/decaffeinated beverages or chocolate 12 hours prior to your test. Do not take any antihistamines 12 hours prior to your test. If the patient has contrast allergy: Patient will need a prescription for Prednisone and very clear instructions (as follows): Prednisone 50 mg - take 13 hours prior to test Take another Prednisone 50 mg 7 hours prior to test Take another Prednisone 50 mg 1 hour prior to test Take Benadryl 50 mg 1 hour prior to test Patient must complete all four doses of above prophylactic medications. Patient will need a ride after test due to Benadryl.  On the Day of the Test: Drink plenty of water until 1 hour prior to the test. Do not eat any food 4 hours prior to the test. You may take your regular medications prior to the test.  Take metoprolol (Lopressor) two hours prior to test. HOLD Furosemide/Hydrochlorothiazide morning of the test. FEMALES- please wear underwire-free bra if available, avoid dresses & tight clothing   *For Clinical Staff only. Please instruct patient the  following:* Heart Rate Medication Recommendations for Cardiac CT  Resting HR < 50 bpm  No medication  Resting HR 50-60 bpm and BP >110/50 mmHG   Consider Metoprolol tartrate 25 mg PO 90-120 min prior to scan  Resting HR 60-65 bpm and BP >110/50 mmHG  Metoprolol tartrate 50 mg PO 90-120 minutes prior to scan   Resting HR > 65 bpm and BP >110/50 mmHG  Metoprolol tartrate 100 mg PO 90-120 minutes prior to scan  Consider Ivabradine 10-15 mg PO or a calcium channel blocker for resting HR >60 bpm and contraindication to  metoprolol tartrate  Consider Ivabradine 10-15 mg PO in combination with metoprolol tartrate for HR >80 bpm         After the Test: Drink plenty of water. After receiving IV contrast, you may experience a mild flushed feeling. This is normal. On occasion, you may experience a mild rash up to 24 hours after the test. This is not dangerous. If this occurs, you can take Benadryl 25 mg and increase your fluid intake. If you experience trouble breathing, this can be serious. If it is severe call 911 IMMEDIATELY. If it is mild, please call our office. If you take any of these medications: Glipizide/Metformin, Avandament, Glucavance, please do not take 48 hours after completing test unless otherwise instructed.  We will call to schedule your test 2-4 weeks out understanding that some insurance companies will need an authorization prior to the service being performed.   For non-scheduling related questions, please contact the cardiac imaging nurse navigator should you have any questions/concerns: Marchia Bond, Cardiac Imaging Nurse Navigator Gordy Clement, Cardiac Imaging Nurse Navigator [place] [facility] Direct Office Dial: [phone]   For scheduling needs, including cancellations and rescheduling, please call [place], [phone].    Follow-Up: At [facility], you and your health needs are our priority.  As part of our continuing mission to provide you with exceptional heart care, we have created designated Provider Care Teams.  These Care Teams include your primary Cardiologist (physician) and Advanced Practice Providers (APPs -  Physician Assistants and Nurse Practitioners) who all work together to provide you with the care you need, when you need it.  We recommend signing up for the patient portal called "MyChart".  Sign up information is provided on this After Visit Summary.  MyChart is used to connect with patients for Virtual Visits (Telemedicine).  Patients are  able to view lab/test results, encounter notes, upcoming appointments, etc.  Non-urgent messages can be sent to your provider as well.   To learn more about what you can do with MyChart, go to [email or web address].    Your next appointment:   6 month(s)  The format for your next appointment:   In Person  Provider:   Minus Breeding, MD       Important Information About Sugar         Signed, Ledora Bottcher, [place]  07/24/2021 10:16 AM    Center

## 2021-07-24 ENCOUNTER — Ambulatory Visit (INDEPENDENT_AMBULATORY_CARE_PROVIDER_SITE_OTHER): Payer: Medicare HMO | Admitting: Physician Assistant

## 2021-07-24 ENCOUNTER — Encounter: Payer: Self-pay | Admitting: Physician Assistant

## 2021-07-24 VITALS — BP 130/82 | HR 63 | Ht 74.0 in | Wt 282.6 lb

## 2021-07-24 DIAGNOSIS — R079 Chest pain, unspecified: Secondary | ICD-10-CM | POA: Diagnosis not present

## 2021-07-24 DIAGNOSIS — I493 Ventricular premature depolarization: Secondary | ICD-10-CM | POA: Diagnosis not present

## 2021-07-24 DIAGNOSIS — G4733 Obstructive sleep apnea (adult) (pediatric): Secondary | ICD-10-CM

## 2021-07-24 LAB — BASIC METABOLIC PANEL
BUN/Creatinine Ratio: 21 — ABNORMAL HIGH (ref 9–20)
BUN: 22 mg/dL (ref 6–24)
CO2: 22 mmol/L (ref 20–29)
Calcium: 9.5 mg/dL (ref 8.7–10.2)
Chloride: 106 mmol/L (ref 96–106)
Creatinine, Ser: 1.03 mg/dL (ref 0.76–1.27)
Glucose: 87 mg/dL (ref 70–99)
Potassium: 4.3 mmol/L (ref 3.5–5.2)
Sodium: 141 mmol/L (ref 134–144)
eGFR: 86 mL/min/{1.73_m2} (ref >59)

## 2021-07-24 LAB — MAGNESIUM: Magnesium: 2.2 mg/dL (ref 1.6–2.3)

## 2021-07-24 MED ORDER — NITROGLYCERIN 0.4 MG SL SUBL: 0.4000 mg | SUBLINGUAL_TABLET | SUBLINGUAL | 3 refills | Status: DC | PRN

## 2021-07-24 NOTE — Patient Instructions (Signed)
Medication Instructions:  No Changes *If you need a refill on your cardiac medications before your next appointment, please call your pharmacy*   Lab Work: BMET, Magnesium. Today If you have labs (blood work) drawn today and your tests are completely normal, you will receive your results only by: [place] (if you have MyChart) OR A paper copy in the mail If you have any lab test that is abnormal or we need to change your treatment, we will call you to review the results.   Testing/Procedures:   Your cardiac CT will be scheduled at one of the below locations:   [facility] [address] [place], [place] [address] [phone]  If scheduled at [facility], please arrive at the [facility] and Children's Entrance (Entrance C2) of [facility] 30 minutes prior to test start time. You can use the FREE valet parking offered at entrance C (encouraged to control the heart rate for the test)  Proceed to the [facility] Radiology Department (first floor) to check-in and test prep.  All radiology patients and guests should use entrance C2 at [facility], accessed from [facility], even though the hospital's physical address listed is [address].    If scheduled at [facility], please arrive 15 mins early for check-in and test prep.  Please follow these instructions carefully (unless otherwise directed):  Hold all erectile dysfunction medications at least 3 days (72 hrs) prior to test.  On the Night Before the Test: Be sure to Drink plenty of water. Do not consume any caffeinated/decaffeinated beverages or chocolate 12 hours prior to your test. Do not take any antihistamines 12 hours prior to your test. If the patient has contrast allergy: Patient will need a prescription for Prednisone and very clear instructions (as follows): Prednisone 50 mg - take 13 hours prior to test Take another  Prednisone 50 mg 7 hours prior to test Take another Prednisone 50 mg 1 hour prior to test Take Benadryl 50 mg 1 hour prior to test Patient must complete all four doses of above prophylactic medications. Patient will need a ride after test due to Benadryl.  On the Day of the Test: Drink plenty of water until 1 hour prior to the test. Do not eat any food 4 hours prior to the test. You may take your regular medications prior to the test.  Take metoprolol (Lopressor) two hours prior to test. HOLD Furosemide/Hydrochlorothiazide morning of the test. FEMALES- please wear underwire-free bra if available, avoid dresses & tight clothing   *For Clinical Staff only. Please instruct patient the following:* Heart Rate Medication Recommendations for Cardiac CT  Resting HR < 50 bpm  No medication  Resting HR 50-60 bpm and BP >110/50 mmHG   Consider Metoprolol tartrate 25 mg PO 90-120 min prior to scan  Resting HR 60-65 bpm and BP >110/50 mmHG  Metoprolol tartrate 50 mg PO 90-120 minutes prior to scan   Resting HR > 65 bpm and BP >110/50 mmHG  Metoprolol tartrate 100 mg PO 90-120 minutes prior to scan  Consider Ivabradine 10-15 mg PO or a calcium channel blocker for resting HR >60 bpm and contraindication to metoprolol tartrate  Consider Ivabradine 10-15 mg PO in combination with metoprolol tartrate for HR >80 bpm         After the Test: Drink plenty of water. After receiving IV contrast, you may experience a mild flushed feeling. This is normal. On occasion, you may  experience a mild rash up to 24 hours after the test. This is not dangerous. If this occurs, you can take Benadryl 25 mg and increase your fluid intake. If you experience trouble breathing, this can be serious. If it is severe call 911 IMMEDIATELY. If it is mild, please call our office. If you take any of these medications: Glipizide/Metformin, Avandament, Glucavance, please do not take 48 hours after completing test unless otherwise  instructed.  We will call to schedule your test 2-4 weeks out understanding that some insurance companies will need an authorization prior to the service being performed.   For non-scheduling related questions, please contact the cardiac imaging nurse navigator should you have any questions/concerns: Marchia Bond, Cardiac Imaging Nurse Navigator Gordy Clement, Cardiac Imaging Nurse Navigator [place] [facility] Direct Office Dial: [phone]   For scheduling needs, including cancellations and rescheduling, please call [place], [phone].    Follow-Up: At [facility], you and your health needs are our priority.  As part of our continuing mission to provide you with exceptional heart care, we have created designated Provider Care Teams.  These Care Teams include your primary Cardiologist (physician) and Advanced Practice Providers (APPs -  Physician Assistants and Nurse Practitioners) who all work together to provide you with the care you need, when you need it.  We recommend signing up for the patient portal called "MyChart".  Sign up information is provided on this After Visit Summary.  MyChart is used to connect with patients for Virtual Visits (Telemedicine).  Patients are able to view lab/test results, encounter notes, upcoming appointments, etc.  Non-urgent messages can be sent to your provider as well.   To learn more about what you can do with MyChart, go to [email or web address].    Your next appointment:   6 month(s)  The format for your next appointment:   In Person  Provider:   Minus Breeding, MD       Important Information About Sugar

## 2021-07-25 ENCOUNTER — Telehealth: Payer: Self-pay | Admitting: Pulmonary Disease

## 2021-07-30 ENCOUNTER — Encounter: Payer: Self-pay | Admitting: Adult Health

## 2021-07-30 ENCOUNTER — Other Ambulatory Visit: Payer: Self-pay | Admitting: Adult Health

## 2021-07-30 ENCOUNTER — Ambulatory Visit: Payer: Medicare HMO | Admitting: Adult Health

## 2021-07-30 ENCOUNTER — Ambulatory Visit (INDEPENDENT_AMBULATORY_CARE_PROVIDER_SITE_OTHER): Payer: Medicare HMO | Admitting: Adult Health

## 2021-07-30 DIAGNOSIS — G4733 Obstructive sleep apnea (adult) (pediatric): Secondary | ICD-10-CM

## 2021-07-30 NOTE — Progress Notes (Signed)
$'@Patient'Y$  ID: Steven Vincent, male    DOB: 1966-01-03, 56 y.o.   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  (Patient does have some intellectual disability reads and writes at a third grade level.  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  Supine AHI 10.5, REM AHI 24.3. HST 02/26/18 >> AHI 11.6, SpO2 low 79% HST 07/07/18 >> AHI 7.7, SpO2 low 83% Auto CPAP 11/20/19 to 12/19/19 >> used on 12 of 30 nights with average 2 hrs 9 min.  Average AHI 4.3 with median CPAP 11 and 95 th percentile CPAP 14 cm H2O CPAP titration 01/31/20 >> CPAP 13 >> AHI 0, no REM.   Cardiac Tests:  Echo 04/28/19 >> EF 60 to 65%, grade 1 DD, ascending aorta 40 mm    07/30/2021 Follow up : OSA Dawna Part Device  Patient presents for a 2-week follow-up.  Patient has underlying obstructive sleep apnea with CPAP intolerance.  He had significant symptom burden with daytime sleepiness and fatigue.  He underwent hypoglossal nerve stimulator placement May 16, 2021.  Did well postop.  He underwent his device activation with ramp up initiation.  Last visit patient was doing well.  He had titrated up to level 4.  Was tolerating well.  Definitely seen a significant improvement in sleep regimen and decrease in daytime sleepiness. Today in office patient says he is doing well now.  Did have some trouble when he went up to level 6.  Had facial twitching and tongue irritation.  Patient also had some difficulty with his remote and phone.  He has since gotten a new phone.  No longer have any issues.  Says his remote is working well.  Patient has went  back down to level 4 and is tolerating well.  Compliance report shows he is using each night.  On average 6 to 7 hours each night.  Feels that it is really helping he has noticed a significant decrease in his daytime sleepiness Today stimulation test in the office appears normal.  Patient has a upcoming inspire titration study later this month.    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   Dysrhythmia    bradycardia   Headache(784.0)    HLD (hyperlipidemia)    Knee pain    Low back pain    Migraines    OSA (obstructive sleep apnea) 04/10/2017   Personality disorder ([place])    Seizures ([place])     Tobacco History: Social History   Tobacco Use  Smoking Status Former  Packs/day: 1.00   Years: 1.00   Pack years: 1.00   Types: Cigarettes   Quit date: 2000   Years since quitting: 23.4  Smokeless Tobacco Never  Tobacco Comments   states he only tried as a teenager   Counseling given: Not Answered Tobacco comments: states he only tried as a teenager   Outpatient Medications Prior to Visit  Medication Sig Dispense Refill   Cholecalciferol (VITAMIN D) 50 MCG (2000 UT) tablet Take 2,000 Units by mouth daily.     diclofenac (FLECTOR) 1.3 % PTCH Place 1 patch onto the skin daily as needed for pain.     diclofenac Sodium (VOLTAREN) 1 % GEL Apply 1 application. topically 4 (four) times daily as needed (pain).     dronedarone (MULTAQ) 400 MG tablet  Take 1 tablet (400 mg total) by mouth 2 (two) times daily with a meal. 180 tablet 3   Erenumab-aooe (AIMOVIG) 140 MG/ML SOAJ Inject 140 mg into the skin every 30 (thirty) days. 3 mL 3   Melatonin 5 MG CAPS Take 5 mg by mouth at bedtime.     meloxicam (MOBIC) 15 MG tablet Take 15 mg by mouth daily.     nitroGLYCERIN (NITROSTAT) 0.4 MG SL tablet Place 1 tablet (0.4 mg total) under the tongue every 5 (five) minutes as needed for chest pain. 25 tablet 3   pravastatin (PRAVACHOL) 20 MG tablet Take 1 tablet (20 mg total) by mouth daily. Keep scheduled appointment for further refills 90 tablet 0   rOPINIRole (REQUIP) 0.5 MG tablet Take 1 tablet (0.5 mg total) by mouth at bedtime. 90 tablet 1   SUMAtriptan 6 MG/0.5ML SOAJ INJECT 6 MG INTO THE SKIN DAILY AS NEEDED AT MIGRAINE ONSET. MAY REPEAT 1 TIME AFTER 1 HOUR IF NEEDED 6 mL 11   tiZANidine (ZANAFLEX) 2 MG tablet Take 1 tablet (2 mg total) by mouth every 6 (six) hours as needed for muscle spasms. 30 tablet 1   topiramate (TOPAMAX) 50 MG tablet Take one tablet in the AM and two tablets in the PM. 270 tablet 3   No facility-administered medications prior to visit.     Review of Systems:   Constitutional:   No  weight loss, night sweats,  Fevers, chills, fatigue, or  lassitude.  HEENT:   No headaches,  Difficulty swallowing,  Tooth/dental problems, or  Sore throat,                No sneezing, itching, ear ache, nasal congestion, post nasal drip,   CV:  No chest pain,  Orthopnea, PND, swelling in lower extremities, anasarca, dizziness, palpitations, syncope.   GI  No heartburn, indigestion, abdominal pain, nausea, vomiting, diarrhea, change in bowel habits, loss of appetite, bloody stools.   Resp: No shortness of breath with exertion or at rest.  No excess mucus, no productive cough,  No non-productive cough,  No coughing up of blood.  No change in color of mucus.  No wheezing.  No chest wall deformity  Skin: no rash or lesions.  GU: no dysuria,  change in color of urine, no urgency or frequency.  No flank pain, no hematuria   MS:  No joint pain or swelling.  No decreased range of motion.  No back pain.    Physical Exam  BP 108/70 (BP Location: Left Arm, Patient Position: Sitting, Cuff Size: Large)   Pulse 78   Temp 98.1 F (36.7 C) (Oral)   Ht '6\' 2"'$  (1.88 m)   Wt 284  lb 12.8 oz (129.2 kg)   SpO2 98%   BMI 36.57 kg/m   GEN: A/Ox3; pleasant , NAD, well nourished    HEENT:  [place]/AT,   NOSE-clear, THROAT-clear, no lesions, no postnasal drip or exudate noted.  Class III MP airway.  No fasciculations noted.  Stimulation test shows tongue deviation to the left and midline.  NECK:  Supple w/ fair ROM; no JVD; normal carotid impulses w/o bruits; no thyromegaly or nodules palpated; no lymphadenopathy.    RESP  Clear  P & A; w/o, wheezes/ rales/ or rhonchi. no accessory muscle use, no dullness to percussion  CARD:  RRR, no m/r/g, no peripheral edema, pulses intact, no cyanosis or clubbing.  GI:   Soft & nt; nml bowel sounds; no organomegaly or masses detected.   Musco: Warm bil, no deformities or joint swelling noted.   Neuro: alert, no focal deficits noted.    Skin: Warm, no lesions or rashes    Lab Results:  CBC   BNP   ProBNP No results found for: PROBNP  Imaging: No results found.        View : No data to display.          No results found for: NITRICOXIDE      Assessment & Plan:   OSA (obstructive sleep apnea) Obstructive sleep apnea with significant symptom burden.  He is CPAP intolerant.  Is now status post inspire device implantation May 16, 2021.  Device activation and ramp up initiation June 14, 2021.  Patient is doing very well.  He did titrate up to a level of 5 and 6 and was unable to tolerate.  Now back on level 4.  He has significant improvement in symptoms.  Stimulation test today in the office appears normal. He will go for upcoming inspire titration study.   Plan  Patient  Instructions  Go for Inspire titration study on 08/15/21 as planned.  Remain on current Inspire level.  Follow up in 6 weeks and As needed        I spent   30 minutes dedicated to the care of this patient on the date of this encounter to include pre-visit review of records, face-to-face time with the patient discussing conditions above, post visit ordering of testing, clinical documentation with the electronic health record, making appropriate referrals as documented, and communicating necessary findings to members of the patients care team.   Rexene Edison, NP 07/30/2021

## 2021-07-30 NOTE — Assessment & Plan Note (Signed)
Obstructive sleep apnea with significant symptom burden.  He is CPAP intolerant.  Is now status post inspire device implantation May 16, 2021.  Device activation and ramp up initiation June 14, 2021.  Patient is doing very well.  He did titrate up to a level of 5 and 6 and was unable to tolerate.  Now back on level 4.  He has significant improvement in symptoms.  Stimulation test today in the office appears normal. He will go for upcoming inspire titration study.   Plan  Patient Instructions  Go for Inspire titration study on 08/15/21 as planned.  Remain on current Inspire level.  Follow up in 6 weeks and As needed

## 2021-07-30 NOTE — Progress Notes (Signed)
Reviewed and agree with assessment/plan.   [name], MD [facility] Pulmonary/Critical Care 07/30/2021, 3:42 PM Pager:  [phone]

## 2021-07-30 NOTE — Patient Instructions (Addendum)
Go for Inspire titration study on 08/15/21 as planned.  Remain on current Inspire level.  Follow up in 6 weeks and As needed

## 2021-07-30 NOTE — Telephone Encounter (Addendum)
Pt came into clinic due to discomfort and issues with application on new phone, needed repairing. Pt entered on level 5 (0.8V) and decreased him down to level 4 (0.7V), where pt is comfortable and notices the most subjective benefit. Able to get pt paired to new phone, and on comfortable settings, with an increase to 20 minutes on his pause time, otherwise no other changes noted. He left feeling much better and with his issues resolved. Will return to clinic 07/30/21 for readiness visit with [facility].

## 2021-07-31 ENCOUNTER — Other Ambulatory Visit: Payer: Self-pay | Admitting: Adult Health

## 2021-08-06 ENCOUNTER — Telehealth: Payer: Self-pay | Admitting: Family Medicine

## 2021-08-06 DIAGNOSIS — F2089 Other schizophrenia: Secondary | ICD-10-CM | POA: Diagnosis not present

## 2021-08-06 NOTE — Telephone Encounter (Signed)
Pt dropped off sleep machine info that he would like Dr. Martinique to look over and add to his chart. Papers have been placed in file.   Please advise.

## 2021-08-06 NOTE — Telephone Encounter (Signed)
In your review bin

## 2021-08-08 NOTE — Telephone Encounter (Signed)
Reviewed by pcp & sent to scan to be placed in pt's chart.

## 2021-08-09 ENCOUNTER — Telehealth (HOSPITAL_COMMUNITY): Payer: Self-pay | Admitting: *Deleted

## 2021-08-09 NOTE — Telephone Encounter (Signed)
Reaching out to patient to offer assistance regarding upcoming cardiac imaging study; pt verbalizes understanding of appt date/time, parking situation and where to check in, pre-test NPO status, and verified current allergies; name and call back number provided for further questions should they arise  Gordy Clement RN Navigator [facility] [phone] office [phone] cell  Patient aware to arrive at 8am.

## 2021-08-10 ENCOUNTER — Ambulatory Visit (HOSPITAL_COMMUNITY)
Admission: RE | Admit: 2021-08-10 | Discharge: 2021-08-10 | Disposition: A | Discharge status: Home / Self Care | Payer: Medicare HMO | Source: Ambulatory Visit | Attending: Physician Assistant | Admitting: Physician Assistant

## 2021-08-10 DIAGNOSIS — R079 Chest pain, unspecified: Secondary | ICD-10-CM | POA: Insufficient documentation

## 2021-08-10 MED ORDER — NITROGLYCERIN 0.4 MG SL SUBL
SUBLINGUAL_TABLET | SUBLINGUAL | Status: AC
  Filled 2021-08-10: qty 2

## 2021-08-10 MED ORDER — NITROGLYCERIN 0.4 MG SL SUBL
0.8000 mg | SUBLINGUAL_TABLET | Freq: Once | SUBLINGUAL | Status: AC
  Administered 2021-08-10: 0.8 mg via SUBLINGUAL

## 2021-08-10 MED ORDER — IOHEXOL 350 MG/ML SOLN
100.0000 mL | Freq: Once | INTRAVENOUS | Status: AC | PRN
  Administered 2021-08-10: 100 mL via INTRAVENOUS

## 2021-08-13 ENCOUNTER — Ambulatory Visit: Payer: Medicare HMO | Admitting: Adult Health

## 2021-08-13 ENCOUNTER — Telehealth: Payer: Self-pay | Admitting: Physician Assistant

## 2021-08-13 NOTE — Telephone Encounter (Signed)
Patient is calling requesting his CT result. He states the hospital advised him he would have his results within 24 hours so he does not understand why no one has called him.

## 2021-08-13 NOTE — Telephone Encounter (Signed)
Spoke with patient who had CT last week. Advised report will be read in 24 hours but still has to be reviewed by ordering provider. Advised will send to Angie PA for final result note  He is also asking for a picture of his heart. Advised he would need to contact hospital HIM dept for a copy of these test results.   Report in epic: IMPRESSION: 1. No evidence of CAD, CADRADS = 0.   2. Coronary calcium score of 0. This was 0 percentile for age and sex matched control.   3. Normal coronary origin with right dominance.   4. Consider non-coronary causes of chest pain     Electronically Signed   By: Pixie Casino M.D.   On: 08/10/2021 09:28

## 2021-08-14 NOTE — Telephone Encounter (Signed)
Patient following up, again requesting to review CT results.

## 2021-08-14 NOTE — Telephone Encounter (Signed)
Attempt to return call-"call cannot be completed at this time, try again later".

## 2021-08-15 ENCOUNTER — Other Ambulatory Visit: Payer: Self-pay | Admitting: *Deleted

## 2021-08-15 ENCOUNTER — Ambulatory Visit (HOSPITAL_BASED_OUTPATIENT_CLINIC_OR_DEPARTMENT_OTHER): Payer: Medicare HMO | Attending: Adult Health | Admitting: Pulmonary Disease

## 2021-08-15 DIAGNOSIS — Z79899 Other long term (current) drug therapy: Secondary | ICD-10-CM | POA: Insufficient documentation

## 2021-08-15 DIAGNOSIS — G4733 Obstructive sleep apnea (adult) (pediatric): Secondary | ICD-10-CM | POA: Insufficient documentation

## 2021-08-15 DIAGNOSIS — E782 Mixed hyperlipidemia: Secondary | ICD-10-CM

## 2021-08-15 MED ORDER — PRAVASTATIN SODIUM 20 MG PO TABS: 20.0000 mg | ORAL_TABLET | Freq: Every day | ORAL | 3 refills | Status: DC

## 2021-08-15 NOTE — Telephone Encounter (Signed)
Steven Vincent, [place]  08/14/2021  7:48 AM EDT     Your CT scan does not show any evidence of coronary calcifications. This is great news for your cardiovascular health. I do not think your chest pain is coming from your heart.    Patient aware and verbalized understanding.

## 2021-08-17 ENCOUNTER — Ambulatory Visit (INDEPENDENT_AMBULATORY_CARE_PROVIDER_SITE_OTHER): Payer: Medicare HMO | Admitting: Family Medicine

## 2021-08-17 ENCOUNTER — Encounter: Payer: Self-pay | Admitting: Family Medicine

## 2021-08-17 VITALS — BP 120/70 | HR 64 | Resp 16 | Ht 74.0 in | Wt 280.0 lb

## 2021-08-17 DIAGNOSIS — K219 Gastro-esophageal reflux disease without esophagitis: Secondary | ICD-10-CM

## 2021-08-17 DIAGNOSIS — R079 Chest pain, unspecified: Secondary | ICD-10-CM | POA: Diagnosis not present

## 2021-08-17 DIAGNOSIS — M159 Polyosteoarthritis, unspecified: Secondary | ICD-10-CM | POA: Diagnosis not present

## 2021-08-17 DIAGNOSIS — Z6835 Body mass index (BMI) 35.0-35.9, adult: Secondary | ICD-10-CM | POA: Diagnosis not present

## 2021-08-17 MED ORDER — PANTOPRAZOLE SODIUM 20 MG PO TBEC: 20.0000 mg | DELAYED_RELEASE_TABLET | Freq: Every day | ORAL | 1 refills | Status: DC

## 2021-08-21 DIAGNOSIS — G4733 Obstructive sleep apnea (adult) (pediatric): Secondary | ICD-10-CM

## 2021-08-21 NOTE — Procedures (Signed)
Patient Name: Steven, Vincent Date: 08/15/2021 Gender: Male D.O.B: [birth year] Age (years): 35 Referring Provider: Tammy Parrett Height (inches): 74 Interpreting Physician: Cyril Mourning MD, ABSM Weight (lbs): 270 RPSGT: Lowry Ram BMI: 35 MRN: [id number] Neck Size: 19.00 <br> <br> CLINICAL INFORMATION The patient is referred for a hypoglossal nerve stimulator implant titration to treat sleep apnea.       PSG 02/15/17 >> AHI 6.9, SpO2 low 85%.  Supine AHI 10.5, REM AHI 24.3.   HST 02/26/18 >> AHI 11.6, SpO2 low 79%   HST 07/07/18 >> AHI 7.7, SpO2 low 83%  SLEEP STUDY TECHNIQUE As per the AASM Manual for the Scoring of Sleep and Associated Events v2.3 (April 2016) with a hypopnea requiring 4% desaturations.  The channels recorded and monitored were frontal, central and occipital EEG, electrooculogram (EOG), submentalis EMG (chin), nasal and oral airflow, thoracic and abdominal wall motion, anterior tibialis EMG, snore microphone, electrocardiogram, and pulse oximetry. Continuous positive airway pressure (CPAP) was initiated at the beginning of the study and titrated to treat sleep-disordered breathing.  MEDICATIONS Medications self-administered by patient taken the night of the study : multaq, ROPINIROLE HCL, NORTRIPTYLINE, Dronedarone HCI, Ropinirole HCI, TOPIRAMATE, MELATONIN  TECHNICIAN COMMENTS Titiration was performed from 0.5 V to 0.9 V RESPIRATORY PARAMETERS Optimal voltage: 0.9 AHI at Optimal Pressure (/hr): 4.2 Overall Minimal O2 (%): 74.0 Supine % at Optimal Pressure (%): 100 Minimal O2 at Optimal Pressure (%): 86.0   SLEEP ARCHITECTURE The study was initiated at 10:41:33 PM and ended at 5:36:48 AM.  Sleep onset time was 9.7 minutes and the sleep efficiency was 82.8%%. The total sleep time was 344 minutes.  The patient spent 3.9%% of the night in stage N1 sleep, 80.8%% in stage N2 sleep, 0.0%% in stage N3 and 15.3% in REM.Stage REM latency was 107.5  minutes  Wake after sleep onset was 61.5. Alpha intrusion was absent. Supine sleep was 62.94%.  CARDIAC DATA The 2 lead EKG demonstrated sinus rhythm. The mean heart rate was 57.5 beats per minute. Other EKG findings include: PVCs. LEG MOVEMENT DATA The total Periodic Limb Movements of Sleep (PLMS) were 0. The PLMS index was 0.0. A PLMS index of <15 is considered normal in adults.  IMPRESSIONS - The optimal level was 0.9 V - Central sleep apnea was not noted during this titration (CAI = 0.5/h). - Severe oxygen desaturations were observed during this titration (min O2 = 74.0%). - The patient snored with moderate snoring volume during this titration study. - 2-lead EKG demonstrated: PVCs - Clinically significant periodic limb movements were not noted during this study. Arousals associated with PLMs were rare.   DIAGNOSIS - Obstructive Sleep Apnea (G47.33)   RECOMMENDATIONS - Set voltage level to 0.9 V for optimal performance - Avoid alcohol, sedatives and other CNS depressants that may worsen sleep apnea and disrupt normal sleep architecture. - Sleep hygiene should be reviewed to assess factors that may improve sleep quality. - Weight management and regular exercise should be initiated or continued. - Return to [facility] Center for re-evaluation after 4 weeks of therapy    Cyril Mourning MD Board Certified in Sleep medicine

## 2021-08-27 ENCOUNTER — Ambulatory Visit (INDEPENDENT_AMBULATORY_CARE_PROVIDER_SITE_OTHER): Payer: Medicare HMO | Admitting: Pulmonary Disease

## 2021-08-27 ENCOUNTER — Encounter: Payer: Self-pay | Admitting: Pulmonary Disease

## 2021-08-27 ENCOUNTER — Telehealth: Payer: Self-pay | Admitting: Adult Health

## 2021-08-27 DIAGNOSIS — G4733 Obstructive sleep apnea (adult) (pediatric): Secondary | ICD-10-CM | POA: Diagnosis not present

## 2021-08-27 NOTE — Telephone Encounter (Signed)
Results reviewed at office visit with Dr. [name]

## 2021-08-27 NOTE — Progress Notes (Signed)
[facility], [facility], [facility] Sleep Medicine  Chief Complaint  Patient presents with   Follow-up    He is here for a follow up for inspire.     Constitutional:  There were no vitals taken for this visit.  Past Medical History:  HA, Low back pain, Migraine HA, Seizures, Personality disorder  Past Surgical History:  His  has a past surgical history that includes Tooth extraction (12/27/2014); Total knee arthroplasty (Left, 11/28/2020); Drug induced endoscopy (N/A, 03/07/2021); Colonoscopy; and Implantation of hypoglossal nerve stimulator (Right, 05/16/2021).  Brief Summary:  Steven Vincent is a 56 y.o. male former smoker with obstructive sleep apnea.  Intolerant of CPAP and had Inspire device implanted in May 2023.      Subjective:   He had titration study with Inspire device on 08/15/21.  Did well with 0.9 V.  He is sleeping well.  Not issues with speech or swallowing.  Not having face/neck/chest pain.  Breathing okay.  Physical Exam:   Appearance - well kempt   ENMT - no sinus tenderness, no oral exudate, no LAN, Mallampati 4 airway, no stridor  Respiratory - equal breath sounds bilaterally, no wheezing or rales  CV - s1s2 regular rate and rhythm, no murmurs  Ext - no clubbing, no edema  Skin - no rashes  Psych - normal mood and affect    Sleep Tests:  PSG 02/15/17 >> AHI 6.9, SpO2 low 85%.  Supine AHI 10.5, REM AHI 24.3. HST 02/26/18 >> AHI 11.6, SpO2 low 79% HST 07/07/18 >> AHI 7.7, SpO2 low 83%   Cardiac Tests:  Echo 04/28/19 >> EF 60 to 65%, grade 1 DD, ascending aorta 40 mm  Social History:  He  reports that he quit smoking about 23 years ago. His smoking use included cigarettes. He has a 1.00 pack-year smoking history. He has never used smokeless tobacco. He reports that he does not drink alcohol and does not use drugs.  Family History:  His family history includes Alzheimer's disease in his father; Aneurysm in his mother; Heart disease in  his father; Hyperlipidemia in his father; Hypertension in his brother and father; Other in his father; Stroke in his brother.     Assessment/Plan:   Obstructive sleep apnea. - intolerant of CPAP - had Inspire device implanted in May 2023 - amplitude 0.9 V with patient control range 0.7 to 1.1 V - he will need follow up home sleep study next year to monitor therapy efficacy  Time Spent Involved in Patient Care on Day of Examination:  26 minutes  Follow up:   Patient Instructions  Follow up in 1 year  Medication List:   Allergies as of 08/27/2021       Reactions   Delsym [dextromethorphan] Other (See Comments)   Gets loopy/memory loss   Aspirin Nausea And Vomiting   GI upset   Mucinex D [pseudoephedrine-guaifenesin Er] Hypertension   Ibuprofen Other (See Comments)   Stomach irritation        Medication List        Accurate as of August 27, 2021 10:33 AM. If you have any questions, ask your nurse or doctor.          Aimovig 140 MG/ML Soaj Generic drug: Erenumab-aooe Inject 140 mg into the skin every 30 (thirty) days.   diclofenac 1.3 % Ptch Commonly known as: FLECTOR Place 1 patch onto the skin daily as needed for pain.   diclofenac Sodium 1 % Gel Commonly known as: VOLTAREN Apply  1 application. topically 4 (four) times daily as needed (pain).   Melatonin 5 MG Caps Take 5 mg by mouth at bedtime.   meloxicam 15 MG tablet Commonly known as: MOBIC Take 15 mg by mouth daily.   Multaq 400 MG tablet Generic drug: dronedarone Take 1 tablet (400 mg total) by mouth 2 (two) times daily with a meal.   nitroGLYCERIN 0.4 MG SL tablet Commonly known as: NITROSTAT Place 1 tablet (0.4 mg total) under the tongue every 5 (five) minutes as needed for chest pain.   pantoprazole 20 MG tablet Commonly known as: Protonix Take 1 tablet (20 mg total) by mouth daily before breakfast.   pravastatin 20 MG tablet Commonly known as: PRAVACHOL Take 1 tablet (20 mg total) by  mouth daily.   rOPINIRole 0.5 MG tablet Commonly known as: REQUIP Take 1 tablet (0.5 mg total) by mouth at bedtime.   SUMAtriptan 6 MG/0.5ML Soaj INJECT 6 MG INTO THE SKIN DAILY AS NEEDED AT MIGRAINE ONSET. MAY REPEAT 1 TIME AFTER 1 HOUR IF NEEDED   tiZANidine 2 MG tablet Commonly known as: ZANAFLEX Take 1 tablet (2 mg total) by mouth every 6 (six) hours as needed for muscle spasms.   topiramate 50 MG tablet Commonly known as: TOPAMAX Take one tablet in the AM and two tablets in the PM.   Vitamin D 50 MCG (2000 UT) tablet Take 2,000 Units by mouth daily.        Signature:  [name], MD [place] Pager - [phone] 08/27/2021, 10:33 AM

## 2021-08-27 NOTE — Patient Instructions (Signed)
Follow up in 1 year.

## 2021-09-03 DIAGNOSIS — F819 Developmental disorder of scholastic skills, unspecified: Secondary | ICD-10-CM | POA: Diagnosis not present

## 2021-09-26 DIAGNOSIS — M5136 Other intervertebral disc degeneration, lumbar region: Secondary | ICD-10-CM | POA: Diagnosis not present

## 2021-09-26 DIAGNOSIS — S39012A Strain of muscle, fascia and tendon of lower back, initial encounter: Secondary | ICD-10-CM | POA: Diagnosis not present

## 2021-09-26 DIAGNOSIS — M25532 Pain in left wrist: Secondary | ICD-10-CM | POA: Diagnosis not present

## 2021-10-01 DIAGNOSIS — M47816 Spondylosis without myelopathy or radiculopathy, lumbar region: Secondary | ICD-10-CM | POA: Diagnosis not present

## 2021-10-09 ENCOUNTER — Telehealth: Payer: Self-pay | Admitting: Pharmacist

## 2021-10-09 NOTE — Chronic Care Management (AMB) (Signed)
Chronic Care Management Pharmacy Assistant   Name: Steven Vincent  MRN: [id number] DOB: [birth year]  Reason for Encounter: Disease State   Conditions to be addressed/monitored: General Assessment  Recent office visits:  08/17/21 Martinique, Betty G, MD - Patient presented for Chest pain unspecified and other concerns. Prescribed Pantoprazole Sodium.  Recent consult visits:  09/03/21 Benjiman Core - Claims encounter for Developmental disorder of scholastic skills. No other visit details available.  08/27/21 [name], MD (Pulmonary Disease) - Patient presented for OSA follow up. No medication changes.  08/21/21 Rigoberto Noel (Pulmonary Disease) - Claims encounter for OSA. No other visit details available.  08/15/21 Claims encounter for OSA and other concerns.  08/06/21 Benjiman Core - Claims encounter for Other schizophrenia. No other visit details available.  07/30/21 Parrett, Fonnie Mu, NP - Patient presented for OSA. No medication changes.  07/24/21 Duke, Tami Lin, PA (Cardiology) - Patient presented for Chest Pain and other concerns. Prescribed Nitroglycerin.  07/13/21 Parrett, Fonnie Mu, NP - Patient presented for OSA. No medication changes.  06/28/21 [place] encounter for Other Schizophrenia. No other visit details available.   06/14/21 Parrett, Fonnie Mu, NP - Patient presented for OSA. Stopped Hydrocodone-Acetaminophen.  05/29/21 Benjiman Core - Claims encounter for Other schizophrenia. No other visit details available.  05/24/21 Hessie Knows, MD - Patient presented to [facility] for Post Op Exam. No medication changes.  05/11/21 Benjiman Core - Claims encounter for Other schizophrenia. No other visit details available.  05/10/21 Hessie Knows, MD Patient presented to [facility] for Pre Op Exam. No medication changes.  05/02/21 Suzzanne Cloud, NP (Neurology) - Patient presented for seizures and  other concerns. Stopped Asprin. Stopped Fluticasone. Stopped Guaifenesin. Stopped Meloxicam.  Hospital visits:  Medication Reconciliation was completed by comparing discharge summary, patient's EMR and Pharmacy list, and upon discussion with patient.  Patient presented to [facility] on 05/16/21 due to Elective Surgery. Patient was present for 6 hours.  New?Medications Started at [facility] Discharge:?? -started  HYDROcodone-acetaminophen (Norco)  Medication Changes at Hospital Discharge: -Changed  none  Medications Discontinued at Hospital Discharge: -Stopped  none  Medications that remain the same after Hospital Discharge:??  -All other medications will remain the same.    Medications: Outpatient Encounter Medications as of 10/09/2021  Medication Sig   Cholecalciferol (VITAMIN D) 50 MCG (2000 UT) tablet Take 2,000 Units by mouth daily.   diclofenac (FLECTOR) 1.3 % PTCH Place 1 patch onto the skin daily as needed for pain.   diclofenac Sodium (VOLTAREN) 1 % GEL Apply 1 application. topically 4 (four) times daily as needed (pain).   dronedarone (MULTAQ) 400 MG tablet Take 1 tablet (400 mg total) by mouth 2 (two) times daily with a meal.   Erenumab-aooe (AIMOVIG) 140 MG/ML SOAJ Inject 140 mg into the skin every 30 (thirty) days.   Melatonin 5 MG CAPS Take 5 mg by mouth at bedtime.   meloxicam (MOBIC) 15 MG tablet Take 15 mg by mouth daily.   nitroGLYCERIN (NITROSTAT) 0.4 MG SL tablet Place 1 tablet (0.4 mg total) under the tongue every 5 (five) minutes as needed for chest pain.   pantoprazole (PROTONIX) 20 MG tablet Take 1 tablet (20 mg total) by mouth daily before breakfast.   pravastatin (PRAVACHOL) 20 MG tablet Take 1 tablet (20 mg total) by mouth daily.   rOPINIRole (REQUIP) 0.5 MG tablet Take 1 tablet (0.5 mg total) by  mouth at bedtime.   SUMAtriptan 6 MG/0.5ML SOAJ INJECT 6 MG INTO THE SKIN DAILY AS NEEDED AT MIGRAINE ONSET. MAY REPEAT 1 TIME AFTER 1 HOUR IF  NEEDED   tiZANidine (ZANAFLEX) 2 MG tablet Take 1 tablet (2 mg total) by mouth every 6 (six) hours as needed for muscle spasms.   topiramate (TOPAMAX) 50 MG tablet Take one tablet in the AM and two tablets in the PM.   No facility-administered encounter medications on file as of 10/09/2021.   [place] for General Review Call   Adherence Review:  Does the Clinical Pharmacist Assistant have access to adherence rates? Yes Adherence rates for STAR metric medications  Pravastatin 20 mg - Last filled 09/29/21 90 DS at [facility] Pravastatin 20 mg - Last filled 07/23/21 90 DS at [facility] Does the patient have >5 day gap between last estimated fill dates for any of the above medications or other medication gaps? No    Disease State Questions:  Able to connect with Patient? Yes Did patient have any problems with their health recently? No  Have you had any admissions or emergency room visits or worsening of your condition(s) since last visit? No  Have you had any visits with new specialists or providers since your last visit? No  Have you had any new health care problem(s) since your last visit? No  Have you run out of any of your medications since you last spoke with clinical pharmacist? No  Are there any medications you are not taking as prescribed? No  Are you having any issues or side effects with your medications? No  Do you have any other health concerns or questions you want to discuss with your Clinical Pharmacist before your next visit? No  Are there any health concerns that you feel we can do a better job addressing? No  Are you having any problems with any of the following since the last visit: (select all that apply)  Other  Details:Patient reports he has secured housing but is still in need of a roommate but has not found one as of yet.  12. Any falls since last visit? Yes  Details: Patient reports he had a fall trimming a tree limb and hurt his wrist and  back he reports no breaks and has seen Ortho since and has been getting injections in his back that help.   13. Any increased or uncontrolled pain since last visit? No   14. Next visit Type: telephone       Visit with: [facility] 10/23 (Patient reports he will be having surg in the fall for his knee so asked for Oct appt)  15. Additional Details? Patient reports he will be having surgery on his right knee in the fall.    Care Gaps: COVID Booster - Overdue Zoster Vaccine - Overdue Flu Vaccine - Overdue TDAP - Postponed HIV Screening - Postponed CCM- 10/23 (Patient requested Oct) BP- 120/70 08/17/21 AWV- 2/23 Lab Results  Component Value Date   HGBA1C 5.3 08/04/2020    Star Rating Drugs: Pravastatin 20 mg - Last filled 09/29/21 90 DS at [place] Pharmacist Assistant [phone]

## 2021-10-17 ENCOUNTER — Telehealth: Payer: Self-pay | Admitting: Pulmonary Disease

## 2021-10-17 DIAGNOSIS — M47816 Spondylosis without myelopathy or radiculopathy, lumbar region: Secondary | ICD-10-CM | POA: Diagnosis not present

## 2021-10-17 NOTE — Telephone Encounter (Signed)
Patient is asking if we can see anything in regards to how he is doing with his Inspire device. He states he is unable to see anything on his app what is going on.   Got the number from Dr Jenetta Downer for Steven Vincent from [place] and advised patient to call him and get any updates or anything in regards to his device. Nothing further needed

## 2021-10-18 ENCOUNTER — Other Ambulatory Visit: Payer: Self-pay | Admitting: Neurology

## 2021-10-18 ENCOUNTER — Other Ambulatory Visit: Payer: Self-pay | Admitting: Family Medicine

## 2021-10-24 DIAGNOSIS — F2089 Other schizophrenia: Secondary | ICD-10-CM | POA: Diagnosis not present

## 2021-11-05 DIAGNOSIS — M1711 Unilateral primary osteoarthritis, right knee: Secondary | ICD-10-CM | POA: Diagnosis not present

## 2021-11-05 DIAGNOSIS — M25561 Pain in right knee: Secondary | ICD-10-CM | POA: Diagnosis not present

## 2021-11-16 ENCOUNTER — Telehealth: Payer: Self-pay

## 2021-11-16 NOTE — Telephone Encounter (Signed)
Patient needs a surgery clearance appt, thank you!

## 2021-11-16 NOTE — Progress Notes (Unsigned)
HPI: [name] is a 56 y.o. male with medical hx significant for OSA,HLD,seizure disorder,migraine headaches,and schizotypal personality here today for surgery clearance requested by Dr Kathi Ludwig. Also to evaluate skin rash on both of his ankles.   He is planning on undergoing right knee replacement on 12/25/21. Spinal anesthesia and sedation. He does not have a person to stay with him for 2 weeks after surgery, so he would like SNF. He has had other surgeries in the past, last one on 05/16/21 for implantation of hypoglossal nerve stimulator and  Underwent left knee patellofemoral replacement on 11/28/20.  No complications during or after surgical procedures.  HLD on Pravastatin 20 mg daily. PVC's, follows with cardiologist. He is on Multaq 400 mg bid.  Lab Results  Component Value Date   CREATININE 1.03 07/24/2021   BUN 22 07/24/2021   NA 141 07/24/2021   K 4.3 07/24/2021   CL 106 07/24/2021   CO2 22 07/24/2021   Lab Results  Component Value Date   WBC 7.2 05/16/2021   HGB 14.8 05/16/2021   HCT 41.1 05/16/2021   MCV 92.2 05/16/2021   PLT 176 05/16/2021   Migraine headache and seizure disorder, follows with neurologist.  LE's pruritic rash noted around 2 weeks ago a day or two after cleaning yard with high grass. Rash is around ankles. Negative for new medication, detergent, soap, or body product. No known insect bite or outdoor exposures to plants. No sick contact. No Hx of similar rash in the past. Problem has improved. OTC medication for this problem: Cortisone cream.  He is also asking if he needs his ears "flushed."  Hx of hearing loss, wears hearing aids. No changes in hearing. Negative for earache or ear drainage.  Review of Systems  Constitutional:  Negative for activity change, appetite change and fever.  HENT:  Negative for nosebleeds and sore throat.   Eyes:  Negative for redness and visual disturbance.  Respiratory:  Negative for cough,  shortness of breath and wheezing.   Cardiovascular:  Negative for chest pain, palpitations and leg swelling.  Gastrointestinal:  Negative for abdominal pain, nausea and vomiting.  Genitourinary:  Negative for decreased urine volume, dysuria and hematuria.  Musculoskeletal:  Positive for arthralgias and back pain. Negative for gait problem.  Skin:  Negative for pallor and wound.  Neurological:  Negative for dizziness, seizures, weakness, numbness and headaches.  Psychiatric/Behavioral:  Negative for confusion and hallucinations.   Rest see pertinent positives and negatives per HPI.  Current Outpatient Medications on File Prior to Visit  Medication Sig Dispense Refill   Cholecalciferol (VITAMIN D) 50 MCG (2000 UT) tablet Take 2,000 Units by mouth daily.     diclofenac Sodium (VOLTAREN) 1 % GEL Apply 1 application. topically 4 (four) times daily as needed (pain).     dronedarone (MULTAQ) 400 MG tablet Take 1 tablet (400 mg total) by mouth 2 (two) times daily with a meal. 180 tablet 3   Erenumab-aooe (AIMOVIG) 140 MG/ML SOAJ Inject 140 mg into the skin every 30 (thirty) days. 3 mL 3   Melatonin 5 MG CAPS Take 5 mg by mouth at bedtime.     meloxicam (MOBIC) 15 MG tablet TAKE 1 TABLET EVERY DAY AS NEEDED FOR PAIN 30 tablet 1   pantoprazole (PROTONIX) 20 MG tablet Take 1 tablet (20 mg total) by mouth daily before breakfast. 90 tablet 1   pravastatin (PRAVACHOL) 20 MG tablet Take 1 tablet (20 mg total) by mouth daily. 90 tablet 3  rOPINIRole (REQUIP) 0.5 MG tablet TAKE 1 TABLET AT BEDTIME 90 tablet 1   SUMAtriptan 6 MG/0.5ML SOAJ INJECT 6 MG INTO THE SKIN DAILY AS NEEDED AT MIGRAINE ONSET. MAY REPEAT 1 TIME AFTER 1 HOUR IF NEEDED 6 mL 11   tiZANidine (ZANAFLEX) 2 MG tablet Take 1 tablet (2 mg total) by mouth every 6 (six) hours as needed for muscle spasms. 30 tablet 1   topiramate (TOPAMAX) 50 MG tablet Take one tablet in the AM and two tablets in the PM. 270 tablet 3   nitroGLYCERIN (NITROSTAT) 0.4  MG SL tablet Place 1 tablet (0.4 mg total) under the tongue every 5 (five) minutes as needed for chest pain. 25 tablet 3   No current facility-administered medications on file prior to visit.    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   Dysrhythmia    bradycardia   Headache(784.0)    HLD (hyperlipidemia)    Knee pain    Low back pain    Migraines    OSA (obstructive sleep apnea) 04/10/2017   Personality disorder (HCC)    Seizures (HCC)    Allergies  Allergen Reactions   Delsym [Dextromethorphan] Other (See Comments)    Gets loopy/memory loss   Aspirin Nausea And Vomiting    GI upset    Mucinex D [Pseudoephedrine-Guaifenesin Er] Hypertension   Ibuprofen Other (See Comments)    Stomach irritation   Social History   Socioeconomic History   Marital status: Divorced    Spouse name: Not on file   Number of children: 0   Years of education: Not on file   Highest education level: Not on file  Occupational History   Occupation: disabled  Tobacco Use   Smoking status: Former    Packs/day: 1.00    Years: 1.00    Total pack years: 1.00    Types: Cigarettes    Quit date: 2000    Years since quitting: 23.7   Smokeless tobacco: Never   Tobacco comments:    states he only tried as a teenager  Scientific laboratory technician Use: Never used  Substance and Sexual Activity   Alcohol use: No   Drug use: No   Sexual activity: Not Currently  Other Topics Concern   Not on file  Social History Narrative   03/16/18: 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 Wife of 16 years left him about 2 years ago, and pt. has had difficulty adjusting.   Says his brother Steven Vincent, who lives in [place], is his main contact, source of support, as well as his dog.    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 States he uses painting as additional therapy.     Social Determinants of Health   Financial Resource Strain: Medium Risk (04/06/2021)   Overall Financial Resource Strain (CARDIA)    Difficulty of Paying Living Expenses: Somewhat hard  Food Insecurity: Food Insecurity Present (04/06/2021)   Hunger Vital Sign    Worried About Running Out of Food in the Last Year: Often true    Ran Out of Food in the Last Year: Sometimes true  Transportation Needs: No Transportation Needs (04/06/2021)   PRAPARE - Hydrologist (Medical): No    Lack of Transportation (Non-Medical): No  Physical Activity: Sufficiently Active (04/06/2021)  Exercise Vital Sign    Days of Exercise per Week: 3 days    Minutes of Exercise per Session: 120 min  Stress: No Stress Concern Present (04/06/2021)   [place]    Feeling of Stress : Not at all  Social Connections: Moderately Integrated (04/06/2021)   Social Connection and Isolation Panel [NHANES]    Frequency of Communication with Friends and Family: More than three times a week    Frequency of Social Gatherings with Friends and Family: More than three times a week    Attends Religious Services: More than 4 times per year    Active Member of [employer] or Organizations: Yes    Attends Archivist Meetings: More than 4 times per year    Marital Status: Divorced   Vitals:   11/19/21 0949  BP: 128/80  Pulse: 85  Resp: 12  SpO2: 98%  Body mass index is 34.68 kg/m.  Physical Exam Vitals and nursing note reviewed.  Constitutional:      General: He is not in acute distress.    Appearance: He is well-developed and well-groomed.  HENT:     Head: Normocephalic and atraumatic.     Right Ear: Decreased hearing noted. No tenderness. No middle ear effusion. There is no impacted cerumen. Tympanic membrane is not erythematous.     Left Ear: Decreased hearing noted. No tenderness.  No middle ear effusion. There is no impacted  cerumen. Tympanic membrane is not erythematous.     Ears:     Comments: Hairing aids, bilateral.    Mouth/Throat:     Mouth: Mucous membranes are moist.     Pharynx: Oropharynx is clear.  Eyes:     Conjunctiva/sclera: Conjunctivae normal.  Cardiovascular:     Rate and Rhythm: Normal rate and regular rhythm.     Pulses:          Dorsalis pedis pulses are 2+ on the right side and 2+ on the left side.     Heart sounds: No murmur heard. Pulmonary:     Effort: Pulmonary effort is normal. No respiratory distress.     Breath sounds: Normal breath sounds.  Abdominal:     Palpations: Abdomen is soft. There is no hepatomegaly or mass.     Tenderness: There is no abdominal tenderness.  Lymphadenopathy:     Cervical: No cervical adenopathy.  Skin:    General: Skin is warm.     Findings: Rash present. No erythema. Rash is not pustular or vesicular.     Comments: Around ankles scattered papular erythematous, some with crusty center. Not tender.  Neurological:     Mental Status: He is alert and oriented to person, place, and time.     Cranial Nerves: No cranial nerve deficit.     Comments: Antalgic gait, not assisted.   ASSESSMENT AND PLAN:  [name] was seen today for pre-op exam.  Diagnoses and all orders for this visit: Orders Placed This Encounter  Procedures   CBC   Basic metabolic panel   Lab Results  Component Value Date   WBC 10.1 11/19/2021   HGB 15.2 11/19/2021   HCT 43.2 11/19/2021   MCV 92.1 11/19/2021   PLT 163.0 11/19/2021   Lab Results  Component Value Date   CREATININE 0.98 11/19/2021   BUN 18 11/19/2021   NA 141 11/19/2021   K 4.0 11/19/2021   CL 108 11/19/2021   CO2 23 11/19/2021   Preop examination Planning  on right patellofemoral knee replacement on 12/25/21.Has had similar procedure for left knee last year, no complications. Chronic problems have been adequately controlled. His cardiologist has provided cardiac clearance. Low risk based on Gupta  perioperative risk. DVT prophylaxis: Early ambulation, consider oral anticoagulation. SNF for post op care to be discussed with surgeon.  Instructed to avoid NSAID's,aspirin,and OTC supplements at least 7 days before procedure. He can take his chronic medications after procedure ,when he is allow to start oral intake. Form will be completed and faxed to ortho's office.  Mixed hyperlipidemia Continue Pravastatin 20 mg daily and low fat diet.  Pruritic erythematous rash ? Chiggers bites. No signs of bacterial infection, so abx is needed at this time. Instructed to keep affected area clean with antibacterial soap and water. Monitor for signs of infection. Topical Triamcinolone, small amount bid for 2 weeks then prn recommended.  -     triamcinolone cream (KENALOG) 0.1 %; Apply 1 Application topically 2 (two) times daily for 14 days.  Return if symptoms worsen or fail to improve, for Keep next appt.  Ashlye Oviedo G. Martinique, MD  [facility] [place] office.

## 2021-11-19 ENCOUNTER — Ambulatory Visit (INDEPENDENT_AMBULATORY_CARE_PROVIDER_SITE_OTHER): Payer: Medicare HMO | Admitting: Family Medicine

## 2021-11-19 ENCOUNTER — Encounter: Payer: Self-pay | Admitting: Family Medicine

## 2021-11-19 ENCOUNTER — Telehealth: Payer: Self-pay

## 2021-11-19 VITALS — BP 128/80 | HR 85 | Resp 12 | Ht 74.0 in | Wt 270.1 lb

## 2021-11-19 DIAGNOSIS — L298 Other pruritus: Secondary | ICD-10-CM

## 2021-11-19 DIAGNOSIS — E782 Mixed hyperlipidemia: Secondary | ICD-10-CM | POA: Diagnosis not present

## 2021-11-19 DIAGNOSIS — Z01818 Encounter for other preprocedural examination: Secondary | ICD-10-CM

## 2021-11-19 LAB — CBC
HCT: 43.2 % (ref 39.0–52.0)
Hemoglobin: 15.2 g/dL (ref 13.0–17.0)
MCHC: 35.1 g/dL (ref 30.0–36.0)
MCV: 92.1 fl (ref 78.0–100.0)
Platelets: 163 10*3/uL (ref 150.0–400.0)
RBC: 4.69 Mil/uL (ref 4.22–5.81)
RDW: 13 % (ref 11.5–15.5)
WBC: 10.1 10*3/uL (ref 4.0–10.5)

## 2021-11-19 LAB — BASIC METABOLIC PANEL
BUN: 18 mg/dL (ref 6–23)
CO2: 23 mEq/L (ref 19–32)
Calcium: 9.2 mg/dL (ref 8.4–10.5)
Chloride: 108 mEq/L (ref 96–112)
Creatinine, Ser: 0.98 mg/dL (ref 0.40–1.50)
GFR: 86.34 mL/min (ref >60.00)
Glucose, Bld: 101 mg/dL — ABNORMAL HIGH (ref 70–99)
Potassium: 4 mEq/L (ref 3.5–5.1)
Sodium: 141 mEq/L (ref 135–145)

## 2021-11-19 MED ORDER — TRIAMCINOLONE ACETONIDE 0.1 % EX CREA: 1.0000 | TOPICAL_CREAM | Freq: Two times a day (BID) | CUTANEOUS | 0 refills | Status: AC

## 2021-11-19 NOTE — Patient Instructions (Addendum)
A few things to remember from today's visit:  Preop examination - Plan: CBC, Basic metabolic panel  Mixed hyperlipidemia - Plan: CBC, Basic metabolic panel  If you need refills for medications you take chronically, please call your pharmacy. Do not use My Chart to request refills or for acute issues that need immediate attention. If you send a my chart message, it may take a few days to be addressed, specially if I am not in the office.  I will complete form and fax it with my note to surgeon. Stop all over the counter supplements and avoid aspirin,Ibuprofen,Naproxen,meloxicam and similar medications at least a week before surgery. Just Tylenol for pain. You can take all your daily medications after you wake up and clear to resume oral intake. Start walking as soon as you are instructed to do so for blood clots prevention. You may be taking oral blood thinner. Pain meds can cause constipation, so have Miralax in case this happen.   Rash around ankle could be chiggers bites. Apply small amount of Triamcinolone 2 times daily on lesions for 2 weeks.  Insect Bite, Adult An insect bite can make your skin red, itchy, and swollen. Some insects can spread disease to people with a bite. However, most insect bites do not lead to disease, and most are not serious. What are the causes? Insects may bite for many reasons, including: Hunger. To defend themselves. Insects that bite include: Spiders. Mosquitoes. Flies. Ticks and fleas. Ants. Kissing bugs. Chiggers. What are the signs or symptoms? Symptoms often last for 2-4 days. However, itching can last up to 10 days. Symptoms include: Itching or pain in the bite area. Redness and swelling in the bite area. An open wound. In rare cases, a person may have a very bad allergic reaction (anaphylactic reaction) to a bite. Symptoms of an anaphylactic reaction may include: Feeling warm in the face (flushed). Your face may turn red. Itchy, red,  swollen areas of skin (hives). Swelling of the eyes, lips, face, mouth, tongue, or throat. Trouble with breathing, talking, or swallowing. High-pitched whistling sounds, most often when breathing out (wheezing). Feeling dizzy or light-headed. Fainting. Pain or cramps in your belly (abdomen). Vomiting. Watery poop (diarrhea). How is this treated? Most insect bites are not serious. Symptoms often go away on their own. When treatment is advised, it may include: Putting ice on the bite area. Putting a cream or lotion, like calamine lotion, on the bite area. This helps with itching. Using medicines called antihistamines. You may also need: A tetanus shot if you are not up to date. An antibiotic cream or medicine. This treatment is needed if the bite area gets infected. Follow these instructions at home: Bite area care  Do not scratch the bite area. It may help to cover the bite area with a bandage or close-fitting clothing. Keep the bite area clean and dry. Check the bite area every day for signs of infection. Check for: More redness, swelling, or pain. Fluid or blood. Warmth. Pus or a bad smell. Wash your hands often. Managing pain, itching, and swelling  You may put any of these on the bite area as told by your doctor: A paste made of baking soda and water. Cortisone cream. Calamine lotion. If told, put ice on the bite area. To do this: Put ice in a plastic bag. Place a towel between your skin and the bag. Leave the ice on for 20 minutes, 2-3 times a day. If your skin turns bright red,  take off the ice right away to prevent skin damage. The risk of skin damage is higher if you cannot feel pain, heat, or cold. General instructions Apply or take over-the-counter and prescription medicines only as told by your doctor. If you were prescribed antibiotics, take or apply them as told by your doctor. Do not stop using them even if you start to feel better. How is this prevented? To  help you have a lower risk of insect bites: When you are outside, wear clothes that cover your arms and legs. Use insect repellent. The best insect repellents contain one of these: DEET. Picaridin. Oil of lemon eucalyptus (OLE). [id number]. Consider spraying your clothing with a pesticide called permethrin. Permethrin helps prevent insect bites. It works for several weeks and for up to 5-6 clothing washes. Do not apply permethrin directly to the skin. If your home windows do not have screens, think about putting some in. If you will be sleeping in an area where there are mosquitoes, consider covering your sleeping area with a mosquito net. Contact a doctor if: You have redness, swelling, or pain in the bite area. You have fluid or blood coming from the bite area. The bite area feels warm to the touch. You have pus or a bad smell coming from the bite area. You have a fever. Get help right away if: You have joint pain. You have a rash. You feel weak or more tired than you normally do. You have neck pain or a headache. You have signs of an anaphylactic reaction. Signs may include: Swelling of your eyes, lips, face, mouth, tongue, or throat. Feeling warm in the face. Itchy, red, swollen areas of skin. Trouble with breathing, talking, or swallowing. Wheezing. Feeling dizzy or light-headed. Fainting. Pain or cramps in your belly. Vomiting or watery poop. These symptoms may be an emergency. Get help right away. Call 911. Do not wait to see if symptoms will go away. Do not drive yourself to the hospital. Summary An insect bite can make your skin red, itchy, and swollen. Treatment is usually not needed. Symptoms often go away on their own. Do not scratch the bite area. Keep it clean and dry. Use insect repellent to help prevent insect bites. Contact a doctor if you have signs of infection. This information is not intended to replace advice given to you by your health care provider. Make  sure you discuss any questions you have with your health care provider. Document Revised: 05/08/2021 Document Reviewed: 05/08/2021 Elsevier Patient Education  [place].   Please be sure medication list is accurate. If a new problem present, please set up appointment sooner than planned today.

## 2021-11-19 NOTE — Telephone Encounter (Signed)
   Name: Steven Vincent  DOB: 1965/10/29  MRN: [id number]  Primary Cardiologist: Minus Breeding, MD   Preoperative team, please contact this patient and set up a phone call appointment for further preoperative risk assessment. Please obtain consent and complete medication review. Thank you for your help.  I confirm that guidance regarding antiplatelet and oral anticoagulation therapy has been completed and, if necessary, noted below (none requested).    Lenna Sciara, NP 11/19/2021, 1:04 PM [place]

## 2021-11-19 NOTE — Telephone Encounter (Signed)
   Pre-operative Risk Assessment    Patient Name: Steven Vincent  DOB: [birth year] MRN: [id number]      Request for Surgical Clearance    Procedure:   right knee patellafemoral replacement  Date of Surgery:  Clearance 12/25/21                                 Surgeon:  Dr. Melrose Nakayama Surgeon's Group or Practice Name:  [facility] Phone number:  [phone] Fax number:  [phone]   Type of Clearance Requested:   - Medical    Type of Anesthesia:  Spinal   Additional requests/questions:    SignedLowella Grip   11/19/2021, 7:21 AM

## 2021-11-19 NOTE — Telephone Encounter (Signed)
  Patient Consent for Virtual Visit        Steven Vincent has provided verbal consent on 11/19/2021 for a virtual visit (video or telephone).   CONSENT FOR VIRTUAL VISIT FOR:  Steven Vincent  By participating in this virtual visit I agree to the following:  I hereby voluntarily request, consent and authorize [facility] and its employed or contracted physicians, physician assistants, nurse practitioners or other licensed health care professionals (the Practitioner), to provide me with telemedicine health care services (the "Services") as deemed necessary by the treating Practitioner. I acknowledge and consent to receive the Services by the Practitioner via telemedicine. I understand that the telemedicine visit will involve communicating with the Practitioner through live audiovisual communication technology and the disclosure of certain medical information by electronic transmission. I acknowledge that I have been given the opportunity to request an in-person assessment or other available alternative prior to the telemedicine visit and am voluntarily participating in the telemedicine visit.  I understand that I have the right to withhold or withdraw my consent to the use of telemedicine in the course of my care at any time, without affecting my right to future care or treatment, and that the Practitioner or I may terminate the telemedicine visit at any time. I understand that I have the right to inspect all information obtained and/or recorded in the course of the telemedicine visit and may receive copies of available information for a reasonable fee.  I understand that some of the potential risks of receiving the Services via telemedicine include:  Delay or interruption in medical evaluation due to technological equipment failure or disruption; Information transmitted may not be sufficient (e.g. poor resolution of images) to allow for appropriate medical decision making by the  Practitioner; and/or  In rare instances, security protocols could fail, causing a breach of personal health information.  Furthermore, I acknowledge that it is my responsibility to provide information about my medical history, conditions and care that is complete and accurate to the best of my ability. I acknowledge that Practitioner's advice, recommendations, and/or decision may be based on factors not within their control, such as incomplete or inaccurate data provided by me or distortions of diagnostic images or specimens that may result from electronic transmissions. I understand that the practice of medicine is not an exact science and that Practitioner makes no warranties or guarantees regarding treatment outcomes. I acknowledge that a copy of this consent can be made available to me via my patient portal ([facility]), or I can request a printed copy by calling the office of [facility].    I understand that my insurance will be billed for this visit.   I have read or had this consent read to me. I understand the contents of this consent, which adequately explains the benefits and risks of the Services being provided via telemedicine.  I have been provided ample opportunity to ask questions regarding this consent and the Services and have had my questions answered to my satisfaction. I give my informed consent for the services to be provided through the use of telemedicine in my medical care

## 2021-11-19 NOTE — Telephone Encounter (Signed)
Spoke with patient who is agreeable to do a tele visit on 10/16 at 9:40 am. Med rec and consent have been done

## 2021-11-21 DIAGNOSIS — F2089 Other schizophrenia: Secondary | ICD-10-CM | POA: Diagnosis not present

## 2021-11-27 ENCOUNTER — Other Ambulatory Visit: Payer: Self-pay | Admitting: Orthopaedic Surgery

## 2021-11-29 NOTE — Progress Notes (Signed)
Chronic Care Management Pharmacy Note  01/16/2021 Name:  Steven Vincent MRN:  [id number] DOB:  Sep 23, 1965  Summary: Patient reports Allegra is not helping with allergies Pt is overdue for annual exam  Recommendations/Changes made from today's visit: -Recommend trial of Claritin or Zyrtec and saline nasal spray for allergies -Scheduled CPE  Plan: CPE in 2 months Follow up assessment in 6 months  Subjective: Steven Vincent is an 56 y.o. year old male who is a primary patient of Martinique, Malka So, MD.  The CCM team was consulted for assistance with disease management and care coordination needs.    Engaged with patient by telephone for follow up visit in response to provider referral for pharmacy case management and/or care coordination services.   Consent to Services:  The patient was given information about Chronic Care Management services, agreed to services, and gave verbal consent prior to initiation of services.  Please see initial visit note for detailed documentation.   Patient Care Team: Martinique, Betty G, MD as PCP - General (Family Medicine) Minus Breeding, MD as PCP - Cardiology (Cardiology) Constance Haw, MD as PCP - Electrophysiology (Cardiology) Norma Fredrickson, MD as Consulting Physician (Psychiatry) Minus Breeding, MD as Consulting Physician (Cardiology) Phylliss Bob, MD as Consulting Physician (Orthopedic Surgery) Kathrynn Ducking, MD (Inactive) as Consulting Physician (Neurology) Viona Gilmore, [facility] as Pharmacist (Pharmacist)  Recent office visits: 11/19/21 Betty Martinique MD: Patient presented for pre-op exam. Prescribed triamcinolone cream for rash.   08/17/21 Martinique, Betty G, MD - Patient presented for Chest pain unspecified and other concerns. Prescribed Pantoprazole Sodium.  Recent consult visits: 11/05/21 Melrose Nakayama (ortho): Patient presented for pain in right knee. Unable to access notes.  10/24/21 Benjiman Core (Psychotherapy) -   Patient presented for schizophrenia follow up. Unable to access notes.  10/17/21 Normajean Glasgow (physical med): Patient presented for spondylosis.   09/03/21 [place] encounter for Developmental disorder of scholastic skills. No other visit details available.   08/27/21 [name], MD (Pulmonary Disease) - Patient presented for OSA follow up. No medication changes.   08/21/21 Rigoberto Noel (Pulmonary Disease) - Claims encounter for OSA. No other visit details available.   08/06/21 Benjiman Core - Claims encounter for Other schizophrenia. No other visit details available.   07/30/21 Parrett, Fonnie Mu, NP - Patient presented for OSA. No medication changes.   07/24/21 Duke, Tami Lin, PA (Cardiology) - Patient presented for Chest Pain and other concerns. Prescribed Nitroglycerin.   07/13/21 Parrett, Fonnie Mu, NP - Patient presented for OSA. No medication changes.   06/28/21 [place] encounter for Other Schizophrenia. No other visit details available.    06/14/21 Parrett, Fonnie Mu, NP - Patient presented for OSA. Stopped Hydrocodone-Acetaminophen.   05/29/21 Benjiman Core - Claims encounter for Other schizophrenia. No other visit details available.  Hospital visits: None in previous 6 months  Objective:  Lab Results  Component Value Date   CREATININE 0.98 11/19/2021   BUN 18 11/19/2021   GFR 86.34 11/19/2021   GFRNONAA >60 11/15/2020   GFRAA 90 04/19/2019   NA 141 11/19/2021   K 4.0 11/19/2021   CALCIUM 9.2 11/19/2021   CO2 23 11/19/2021   GLUCOSE 101 (H) 11/19/2021    Lab Results  Component Value Date/Time   HGBA1C 5.3 08/04/2020 09:53 AM   HGBA1C 4.9 09/21/2018 03:51 PM   GFR 86.34 11/19/2021 10:35 AM   GFR 92.77 08/04/2020 09:53 AM    Last diabetic Eye  exam: No results found for: "HMDIABEYEEXA"  Last diabetic Foot exam: No results found for: "HMDIABFOOTEX"   Lab Results  Component Value Date   CHOL 146 08/04/2020   HDL 38.10 (L) 08/04/2020    LDLCALC 84 08/04/2020   LDLDIRECT 70.0 07/07/2018   TRIG 119.0 08/04/2020   CHOLHDL 4 08/04/2020       Latest Ref Rng & Units 08/04/2020    9:53 AM 07/07/2018    8:33 AM 02/21/2016    9:07 AM  Hepatic Function  Total Protein 6.0 - 8.3 g/dL 7.0  7.3  6.3   Albumin 3.5 - 5.2 g/dL 4.5  4.5  4.1   AST 0 - 37 U/L '20  24  21   ' ALT 0 - 53 U/L '23  25  21   ' Alk Phosphatase 39 - 117 U/L 62  71  78   Total Bilirubin 0.2 - 1.2 mg/dL 0.4  0.5  0.4   Bilirubin, Direct 0.0 - 0.3 mg/dL  0.1      Lab Results  Component Value Date/Time   TSH 3.04 02/09/2020 03:11 PM   TSH 5.86 (H) 01/25/2020 10:35 AM   FREET4 1.5 02/09/2020 03:11 PM   FREET4 1.07 03/17/2017 11:02 AM       Latest Ref Rng & Units 11/19/2021   10:35 AM 05/16/2021    8:21 AM 01/29/2021   11:41 AM  CBC  WBC 4.0 - 10.5 K/uL 10.1  7.2  6.8   Hemoglobin 13.0 - 17.0 g/dL 15.2  14.8  15.0   Hematocrit 39.0 - 52.0 % 43.2  41.1  43.5   Platelets 150.0 - 400.0 K/uL 163.0  176  167     Lab Results  Component Value Date/Time   VD25OH 45.27 10/16/2016 09:00 AM    Clinical ASCVD: No  The 10-year ASCVD risk score (Arnett DK, et al., 2019) is: 5.5%   Values used to calculate the score:     Age: 54 years     Sex: Male     Is Non-Hispanic African American: No     Diabetic: No     Tobacco smoker: No     Systolic Blood Pressure: 106 mmHg     Is BP treated: No     HDL Cholesterol: 38.1 mg/dL     Total Cholesterol: 146 mg/dL       11/19/2021    9:58 AM 08/17/2021   10:15 AM 04/06/2021    1:59 PM  Depression screen PHQ 2/9  Decreased Interest 0 0 0  Down, Depressed, Hopeless 0 0 0  PHQ - 2 Score 0 0 0  Altered sleeping 1    Tired, decreased energy 2    Change in appetite 0    Feeling bad or failure about yourself  0    Trouble concentrating 0    Moving slowly or fidgety/restless 0    Suicidal thoughts 0    PHQ-9 Score 3    Difficult doing work/chores Not difficult at all        Social History   Tobacco Use  Smoking  Status Former   Packs/day: 1.00   Years: 1.00   Total pack years: 1.00   Types: Cigarettes   Quit date: 2000   Years since quitting: 23.7  Smokeless Tobacco Never  Tobacco Comments   states he only tried as a teenager   BP Readings from Last 3 Encounters:  11/19/21 128/80  08/17/21 120/70  08/10/21 130/83   Pulse Readings from Last  3 Encounters:  11/19/21 85  08/17/21 64  08/10/21 (!) 58   Wt Readings from Last 3 Encounters:  11/19/21 270 lb 2 oz (122.5 kg)  08/17/21 280 lb (127 kg)  08/15/21 280 lb (127 kg)   BMI Readings from Last 3 Encounters:  11/19/21 34.68 kg/m  08/17/21 35.95 kg/m  08/15/21 35.95 kg/m    Assessment/Interventions: Review of patient past medical history, allergies, medications, health status, including review of consultants reports, laboratory and other test data, was performed as part of comprehensive evaluation and provision of chronic care management services.   SDOH:  (Social Determinants of Health) assessments and interventions performed: Yes  SDOH Interventions    Flowsheet Row Chronic Care Management from 11/30/2021 in [facility] [facility] [place] from 04/06/2021 in [facility] from 02/02/2021 in [facility] at [place] Management from 07/20/2020 in [facility] [place] from 04/05/2020 in [place] [facility] [place] from 03/16/2018 in [facility] at [place] Interventions -- Other (Comment)  [Patient involved with [facility]] -- -- Intervention Not Indicated --  Housing Interventions -- Other (Comment)  [Patient pending assist] Other (Comment)  [Care Guide Referral] -- Intervention Not Indicated --  Transportation Interventions Intervention Not Indicated Intervention Not Indicated -- Intervention Not Indicated Intervention Not Indicated --  Depression  Interventions/Treatment  -- -- -- -- -- Currently on Treatment  [sees psychiatry. Declined additional counseling referrals. ]  Financial Strain Interventions -- Other (Comment)  [Patient recieves counseling] -- Intervention Not Indicated Intervention Not Indicated --  Physical Activity Interventions -- Intervention Not Indicated -- -- Intervention Not Indicated --  Stress Interventions -- -- -- -- Intervention Not Indicated --  Social Connections Interventions -- Intervention Not Indicated -- -- Intervention Not Indicated --       SDOH Screenings   Food Insecurity: Food Insecurity Present (04/06/2021)  Housing: Low Risk  (04/06/2021)  Recent Concern: Housing - Medium Risk (02/02/2021)  Transportation Needs: No Transportation Needs (11/30/2021)  Alcohol Screen: Low Risk  (04/05/2020)  Depression (PHQ2-9): Low Risk  (11/19/2021)  Financial Resource Strain: Medium Risk (04/06/2021)  Physical Activity: Sufficiently Active (04/06/2021)  Social Connections: Moderately Integrated (04/06/2021)  Stress: No Stress Concern Present (04/06/2021)  Tobacco Use: Medium Risk (11/19/2021)    CCM Care Plan  Allergies  Allergen Reactions   Delsym [Dextromethorphan] Other (See Comments)    Gets loopy/memory loss   Aspirin Nausea And Vomiting    GI upset    Mucinex D [Pseudoephedrine-Guaifenesin Er] Hypertension   Ibuprofen Other (See Comments)    Stomach irritation    Medications Reviewed Today     Reviewed by Viona Gilmore, [facility] (Pharmacist) on 11/30/21 at [place] List Status: <None>   Medication Order Taking? Sig Documenting Provider Last Dose Status Informant  Cholecalciferol (VITAMIN D) 50 MCG (2000 UT) tablet [id number] No Take 2,000 Units by mouth daily. [provider] Taking Active Self  diclofenac Sodium (VOLTAREN) 1 % GEL [id number] No Apply 1 application. topically 4 (four) times daily as needed (pain). [provider] Taking Active Self  dronedarone (MULTAQ) 400 MG tablet  [id number] No Take 1 tablet (400 mg total) by mouth 2 (two) times daily with a meal. Minus Breeding, MD Taking Active Self  Erenumab-aooe (AIMOVIG) 140 MG/ML SOAJ [id number] No Inject 140 mg into the skin every 30 (thirty) days. Suzzanne Cloud, NP Taking Active  Self  Melatonin 5 MG CAPS [id number] No Take 5 mg by mouth at bedtime. [provider] Taking Active Self  meloxicam (MOBIC) 15 MG tablet [id number] No TAKE 1 TABLET EVERY DAY AS NEEDED FOR PAIN Martinique, Betty G, MD Taking Active   nitroGLYCERIN (NITROSTAT) 0.4 MG SL tablet [id number] No Place 1 tablet (0.4 mg total) under the tongue every 5 (five) minutes as needed for chest pain. Ledora Bottcher, PA Taking Expired 10/22/21 2359   pantoprazole (PROTONIX) 20 MG tablet [id number] No Take 1 tablet (20 mg total) by mouth daily before breakfast. Martinique, Betty G, MD Taking Active   pravastatin (PRAVACHOL) 20 MG tablet [id number] No Take 1 tablet (20 mg total) by mouth daily. Minus Breeding, MD Taking Active   rOPINIRole (REQUIP) 0.5 MG tablet [id number] No TAKE 1 TABLET AT BEDTIME Suzzanne Cloud, NP Taking Active   SUMAtriptan 6 MG/0.5ML Darden Palmer [id number] No INJECT 6 MG INTO THE SKIN DAILY AS NEEDED AT MIGRAINE ONSET. MAY REPEAT 1 TIME AFTER 1 HOUR IF NEEDED Suzzanne Cloud, NP Taking Active   topiramate (TOPAMAX) 50 MG tablet [id number] No Take one tablet in the AM and two tablets in the PM. Suzzanne Cloud, NP Taking Active Self  triamcinolone cream (KENALOG) 0.1 % [id number] No Apply 1 Application topically 2 (two) times daily for 14 days. Martinique, Betty G, MD Taking Active             Patient Active Problem List   Diagnosis Date Noted   Primary osteoarthritis of left knee 11/28/2020   Educated about COVID-19 virus infection 07/22/2019   Generalized osteoarthritis of multiple sites 04/19/2019   Fatigue 09/21/2018   ED (erectile dysfunction) 07/06/2018   Allergic rhinitis 03/09/2018   Insomnia 03/06/2018   DJD (degenerative joint  disease), lumbosacral 06/19/2017   Bradycardia 04/14/2017   OSA (obstructive sleep apnea) 04/10/2017   Syncope 01/02/2017   PVC (premature ventricular contraction) 01/02/2017   Class 2 obesity with body mass index (BMI) of 35.0 to 35.9 in adult 11/13/2016   Chest pain, unspecified 10/16/2016   Hyperlipidemia 10/16/2016   Radicular pain of left lower extremity 01/23/2016   RLS (restless legs syndrome) 01/23/2016   Hemiballismus 01/17/2015   Migraine without aura 01/19/2013   Migraine 08/03/2010   Hip pain, left 04/26/2010   Hemorrhoids, internal, with bleeding 04/26/2010   Daytime hypersomnolence 11/14/2009   REACTIVE HYPOGLYCEMIA 04/13/2008   WEIGHT GAIN 04/13/2008   SCHIZOTYPAL PERSONALITY DISORDER 07/07/2007   Low back pain with radiation 11/06/2006   Seizures ([place]) 11/06/2006   Headache, unspecified headache type 11/06/2006    Immunization History  Administered Date(s) Administered   Influenza Split 12/03/2010   Influenza Whole 12/04/2006, 11/17/2007, 01/11/2009   Influenza,inj,Quad PF,6+ Mos 10/25/2018   Td 12/04/2006   Patient reports his biggest annoyance right now is with his CPAP machine that has to be reset in his phone every day. He doesn't know why he has to keep resetting it every day and it has something to do with the wifi.   He just switched insurances to [facility] and will be stopping by with his new insurance today.   Patient doesn't feel like anything is helping with the post nasal drip. He was using Flonase and this didn't help. He also tried Human resources officer and this didn't really help.   Conditions to be addressed/monitored:  Hyperlipidemia, GERD, and Migraines, Personality disorder, Insomnia  Conditions addressed this visit: Hyperlipidemia, allergic rhinitis   Care Plan : [place]  Updates made by Viona Gilmore, [place] since 11/30/2021 12:00 AM     Problem: Problem: Hyperlipidemia, GERD and Migraines, Personality disorder      Long-Range Goal:  Patient-Specific Goal   Start Date: 07/20/2020  Expected End Date: 07/20/2021  Recent Progress: On track  Priority: High  Note:   Current Barriers:  Unable to independently monitor therapeutic efficacy  Pharmacist Clinical Goal(s):  Patient will achieve adherence to monitoring guidelines and medication adherence to achieve therapeutic efficacy achieve ability to self administer medications as prescribed through use of an alarm as evidenced by patient report through collaboration with PharmD and provider.   Interventions: 1:1 collaboration with Martinique, Betty G, MD regarding development and update of comprehensive plan of care as evidenced by provider attestation and co-signature Inter-disciplinary care team collaboration (see longitudinal plan of care) Comprehensive medication review performed; medication list updated in electronic medical record  Hyperlipidemia: (LDL goal < 100) -Controlled -Current treatment: Pravastatin 20 mg 1 tablet daily - Appropriate, Effective, Safe, Accessible -Medications previously tried: none  -Current dietary patterns: not eating out much; eating leaner meats -Current exercise habits: pushing carts at work; no structured exercsie -Educated on Cholesterol goals;  Benefits of statin for ASCVD risk reduction; Importance of limiting foods high in cholesterol; Exercise goal of 150 minutes per week; -Counseled on diet and exercise extensively  PVCs (Goal: prevent heart events) -Controlled -Current treatment: Rhythm control: Multaq 400 mg 1 tablet twice daily with meals - Appropriate, Effective, Safe, Accessible -Medications previously tried: none -Home BP and HR readings: does not check at home  -Counseled on  importance of monitoring HR at home -Recommended to continue current medication  Personality disorder/insomnia (Goal: minimize symptoms and improve sleep) -Controlled -Current treatment: Melatonin 5 mg 1 tablet as needed - Appropriate, Effective,  Safe, Accessible -Medications previously tried/failed: notriptyline (constipation and not effective) -Educated on Benefits of medication for symptom control Benefits of cognitive-behavioral therapy with or without medication -Counseled on practicing good sleep hygiene by setting a sleep schedule and maintaining it, avoid excessive napping, following a nightly routine, avoiding screen time for 30-60 minutes before going to bed, and making the bedroom a cool, quiet and dark space  Migraines (Goal: minimize symptoms and severity of migraines) -Controlled -Current treatment  Aimovig 140 mg/mL inject into the skin every 30 days - Appropriate, Effective, Safe, Accessible Sumatriptan 0.6 mg/0.5 mL inject into the skin as needed - Appropriate, Effective, Safe, Accessible Topiramate 50 mg 1 tablet in the morning and 2 tablets in the evening - Appropriate, Effective, Safe, Accessible -Medications previously tried: n/a  -Recommended to continue current medication  Restless legs syndrome (Goal: minimize symptoms) -Controlled -Current treatment  Ropinirole 0.25 mg 1 tablet at bedtime - Appropriate, Effective, Safe, Accessible -Medications previously tried: none  -Recommended to continue current medication  Osteoarthritis (Goal: minimize symptoms) -Not ideally controlled -Current treatment  Meloxicam 15 mg 1 tablet daily as needed - (taking daily) - Appropriate, Effective, Query Safe, Accessible -Medications previously tried: n/a  -Educated on risk for bleeding with NSAIDs given history of ulcers. Recommended Tylenol for pain.   Health Maintenance -Vaccine gaps: shingles, COVID, tetanus, influenza -Current therapy:  Multivitamin 1 tablet daily Triamcinolone cream as needed -Educated on Cost vs benefit of each product must be carefully weighed by individual consumer -Patient is satisfied with current therapy and denies issues -Recommended to continue current medication  Patient  Goals/Self-Care Activities Patient will:  - take medications as prescribed focus on medication adherence by setting an alarm for medication  reminders target a minimum of 150 minutes of moderate intensity exercise weekly engage in dietary modifications by limiting intake of sweets  Follow Up Plan: The care management team will reach out to the patient again over the next 180 days.        Medication Assistance: None required.  Patient affirms current coverage meets needs.  Compliance/Adherence/Medication fill history: Care Gaps: Shingrix, COVID, influenza, Prevnar 20  Star-Rating Drugs: Pravastatin 20 mg - Last filled 09/29/21 90 [facility]  Patient's preferred pharmacy is:  [id number]Phone: [phone]: [address] [address]Phone: [phone]: [phone]  Uses pill box? Yes Pt endorses 80% compliance  We discussed: Benefits of medication synchronization, packaging and delivery as well as enhanced pharmacist oversight with [employer]. Patient decided to: Continue current medication management strategy  Care Plan and Follow Up Patient Decision:  Patient agrees to Care Plan and Follow-up.  Plan: The care management team will reach out to the patient again over the next 180 days.  MJeni Salles PharmD BGarfield County Public HospitalClinical Pharmacist LUrbanaat BRaleigh

## 2021-11-30 ENCOUNTER — Ambulatory Visit: Payer: Medicare Other | Admitting: Pharmacist

## 2021-11-30 ENCOUNTER — Telehealth: Payer: Self-pay | Admitting: Pharmacist

## 2021-11-30 DIAGNOSIS — E782 Mixed hyperlipidemia: Secondary | ICD-10-CM

## 2021-11-30 DIAGNOSIS — K219 Gastro-esophageal reflux disease without esophagitis: Secondary | ICD-10-CM

## 2021-11-30 NOTE — Chronic Care Management (AMB) (Signed)
    Chronic Care Management Pharmacy Assistant   Name: Steven Vincent  MRN: [id number] DOB: 01-18-1966  APPOINTMENT REMINDER   Patient was reminded to have all medications, supplements and any blood glucose and blood pressure readings available for review with Jeni Salles, Pharm. D, for telephone visit on 11/30/21 at 9:15.    Care Gaps: COVID Booster - Overdue Zoster Vaccine - Overdue Flu Vaccine - Overdue TDAP - Postponed HIV Screen - Postponed AWV- 2/23  Star Rating Drug: Pravastatin 20 mg - Last filled 09/29/21 90 DS at [facility]    Medications: Outpatient Encounter Medications as of 11/30/2021  Medication Sig   Cholecalciferol (VITAMIN D) 50 MCG (2000 UT) tablet Take 2,000 Units by mouth daily.   diclofenac Sodium (VOLTAREN) 1 % GEL Apply 1 application. topically 4 (four) times daily as needed (pain).   dronedarone (MULTAQ) 400 MG tablet Take 1 tablet (400 mg total) by mouth 2 (two) times daily with a meal.   Erenumab-aooe (AIMOVIG) 140 MG/ML SOAJ Inject 140 mg into the skin every 30 (thirty) days.   Melatonin 5 MG CAPS Take 5 mg by mouth at bedtime.   meloxicam (MOBIC) 15 MG tablet TAKE 1 TABLET EVERY DAY AS NEEDED FOR PAIN   nitroGLYCERIN (NITROSTAT) 0.4 MG SL tablet Place 1 tablet (0.4 mg total) under the tongue every 5 (five) minutes as needed for chest pain.   pantoprazole (PROTONIX) 20 MG tablet Take 1 tablet (20 mg total) by mouth daily before breakfast.   pravastatin (PRAVACHOL) 20 MG tablet Take 1 tablet (20 mg total) by mouth daily.   rOPINIRole (REQUIP) 0.5 MG tablet TAKE 1 TABLET AT BEDTIME   SUMAtriptan 6 MG/0.5ML SOAJ INJECT 6 MG INTO THE SKIN DAILY AS NEEDED AT MIGRAINE ONSET. MAY REPEAT 1 TIME AFTER 1 HOUR IF NEEDED   topiramate (TOPAMAX) 50 MG tablet Take one tablet in the AM and two tablets in the PM.   triamcinolone cream (KENALOG) 0.1 % Apply 1 Application topically 2 (two) times daily for 14 days.   No facility-administered encounter medications on  file as of 11/30/2021.       [place] Clinical Pharmacist Assistant [phone]

## 2021-11-30 NOTE — Patient Instructions (Signed)
Hi Steven Vincent,  It was great to catch up again! Don't forget to try either Claritin or Zyrtec for your allergies to replace the [place]. I also think it would help to add the saline nasal spray as well.  Please reach out to me if you have any questions or need anything!  Best, Maddie  Jeni Salles, PharmD, [place] Pharmacist [facility]   Visit Information   Goals Addressed   None    Patient Care Plan: CCM Pharmacy Care Plan     Problem Identified: Problem: Hyperlipidemia, GERD and Migraines, Personality disorder      Long-Range Goal: Patient-Specific Goal   Start Date: 07/20/2020  Expected End Date: 07/20/2021  Recent Progress: On track  Priority: High  Note:   Current Barriers:  Unable to independently monitor therapeutic efficacy  Pharmacist Clinical Goal(s):  Patient will achieve adherence to monitoring guidelines and medication adherence to achieve therapeutic efficacy achieve ability to self administer medications as prescribed through use of an alarm as evidenced by patient report through collaboration with PharmD and provider.   Interventions: 1:1 collaboration with Martinique, Betty G, MD regarding development and update of comprehensive plan of care as evidenced by provider attestation and co-signature Inter-disciplinary care team collaboration (see longitudinal plan of care) Comprehensive medication review performed; medication list updated in electronic medical record  Hyperlipidemia: (LDL goal < 100) -Controlled -Current treatment: Pravastatin 20 mg 1 tablet daily - Appropriate, Effective, Safe, Accessible -Medications previously tried: none  -Current dietary patterns: not eating out much; eating leaner meats -Current exercise habits: pushing carts at work; no structured exercsie -Educated on Cholesterol goals;  Benefits of statin for ASCVD risk reduction; Importance of limiting foods high in cholesterol; Exercise  goal of 150 minutes per week; -Counseled on diet and exercise extensively  PVCs (Goal: prevent heart events) -Controlled -Current treatment: Rhythm control: Multaq 400 mg 1 tablet twice daily with meals - Appropriate, Effective, Safe, Accessible -Medications previously tried: none -Home BP and HR readings: does not check at home  -Counseled on  importance of monitoring HR at home -Recommended to continue current medication  Personality disorder/insomnia (Goal: minimize symptoms and improve sleep) -Controlled -Current treatment: Melatonin 5 mg 1 tablet as needed - Appropriate, Effective, Safe, Accessible -Medications previously tried/failed: notriptyline (constipation and not effective) -Educated on Benefits of medication for symptom control Benefits of cognitive-behavioral therapy with or without medication -Counseled on practicing good sleep hygiene by setting a sleep schedule and maintaining it, avoid excessive napping, following a nightly routine, avoiding screen time for 30-60 minutes before going to bed, and making the bedroom a cool, quiet and dark space  Migraines (Goal: minimize symptoms and severity of migraines) -Controlled -Current treatment  Aimovig 140 mg/mL inject into the skin every 30 days - Appropriate, Effective, Safe, Accessible Sumatriptan 0.6 mg/0.5 mL inject into the skin as needed - Appropriate, Effective, Safe, Accessible Topiramate 50 mg 1 tablet in the morning and 2 tablets in the evening - Appropriate, Effective, Safe, Accessible -Medications previously tried: n/a  -Recommended to continue current medication  Restless legs syndrome (Goal: minimize symptoms) -Controlled -Current treatment  Ropinirole 0.25 mg 1 tablet at bedtime - Appropriate, Effective, Safe, Accessible -Medications previously tried: none  -Recommended to continue current medication  Osteoarthritis (Goal: minimize symptoms) -Not ideally controlled -Current treatment  Meloxicam 15 mg 1  tablet daily as needed - (taking daily) - Appropriate, Effective, Query Safe, Accessible -Medications previously tried: n/a  -Educated on risk for bleeding with NSAIDs given history  of ulcers. Recommended Tylenol for pain.   Health Maintenance -Vaccine gaps: shingles, COVID, tetanus, influenza -Current therapy:  Multivitamin 1 tablet daily Triamcinolone cream as needed -Educated on Cost vs benefit of each product must be carefully weighed by individual consumer -Patient is satisfied with current therapy and denies issues -Recommended to continue current medication  Patient Goals/Self-Care Activities Patient will:  - take medications as prescribed focus on medication adherence by setting an alarm for medication reminders target a minimum of 150 minutes of moderate intensity exercise weekly engage in dietary modifications by limiting intake of sweets  Follow Up Plan: The care management team will reach out to the patient again over the next 180 days.      Patient Care Plan: LCSW Plan of Care     Problem Identified: Barriers to Treatment      Goal: Barriers to Treatment   Start Date: 02/02/2021  This Visit's Progress: On track  Priority: High  Note:   Current barriers:    Housing barriers and Mental Health Concerns  Clinical Goals: Patient will work with CCM LCSW to address needs related to obtaining independent and safe housing Clinical Interventions:  Assessment of needs, barriers , agencies contacted, as well as how impacting  Patient is interested in housing for seniors 55+. He has worked with [employer] and [employer] with [employer]. Patient endorses frustration that he has not obtained assistance, despite working with various agencies Patient reports limited support system, stating "I have no family or friends. I have nothing" Patient is grieving the loss of service animal Patient participates in therapy, next appt scheduled for Jan 2023   Active listening / Reflection  utilized  Emotional Support Provided Provided psychoeducation for mental health needs  Verbalization of feelings encouraged  Made referral to Care Guide for local housing resources Review various resources, discussed options and provided patient information about  Housing resources  1:1 collaboration with primary care provider regarding development and update of comprehensive plan of care as evidenced by provider attestation and co-signature Inter-disciplinary care team collaboration (see longitudinal plan of care) Patient Goals/Self-Care Activities: Over the next 120 days Contact PCP office with any questions or concerns Attend scheduled appointments Continue with therapy Utilize resources discussed        Patient verbalizes understanding of instructions and care plan provided today and agrees to view in [place]. Active MyChart status and patient understanding of how to access instructions and care plan via MyChart confirmed with patient.    The pharmacy team will reach out to the patient again over the next 180 days.   Viona Gilmore, [facility]

## 2021-12-04 ENCOUNTER — Telehealth: Payer: Self-pay | Admitting: Pulmonary Disease

## 2021-12-04 NOTE — Telephone Encounter (Signed)
Will send encounter to [facility] as requested.

## 2021-12-06 NOTE — Telephone Encounter (Signed)
Spoke to pt.  Nothing further needed.

## 2021-12-07 NOTE — Telephone Encounter (Signed)
Pt came into the office with issues pairing his remote to the [place] device. Raven & I removed the inspire app from pt's phone & re-installed the app to pt's phone at the patient's request.    Once the Inspire App was re-installed, we continued to have issues getting the remote to pair with the device.  Eventually we were able to pair the remote with the device & I called the [place] spoke to [place] who advised that an update to their App will be released next week & they hope it will fix the connectivity issues [name] is experiencing.  [place] asked [name] to call them next Friday if the update does not fix the issue.

## 2021-12-10 ENCOUNTER — Ambulatory Visit: Payer: Medicare Other | Attending: Cardiology | Admitting: Nurse Practitioner

## 2021-12-10 DIAGNOSIS — Z01810 Encounter for preprocedural cardiovascular examination: Secondary | ICD-10-CM | POA: Diagnosis not present

## 2021-12-10 NOTE — Progress Notes (Signed)
Virtual Visit via Telephone Note   Because of Steven Vincent's co-morbid illnesses, he is at least at moderate risk for complications without adequate follow up.  This format is felt to be most appropriate for this patient at this time.  The patient did not have access to video technology/had technical difficulties with video requiring transitioning to audio format only (telephone).  All issues noted in this document were discussed and addressed.  No physical exam could be performed with this format.  Please refer to the patient's chart for his consent to telehealth for [facility].  Evaluation Performed:  Preoperative cardiovascular risk assessment _____________   Date:  12/10/2021   Patient ID:  Steven Vincent, DOB April 01, 1965, MRN [id number] Patient Location:  Home Provider location:   Office  Primary Care Provider:  Martinique, Betty G, MD Primary Cardiologist:  Minus Breeding, MD  Chief Complaint / Patient Profile   56 y.o. y/o male with a h/o PVCs, bradycardia, normal POET in 2018, hyperlipidemia, OSA, and seizures who is pending right knee patellofemoral replacement on 12/25/2021 with Dr. Melrose Nakayama of [facility] orthopedic and presents today for telephonic preoperative cardiovascular risk assessment.  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   Dysrhythmia    bradycardia   Headache(784.0)    HLD (hyperlipidemia)    Knee pain    Low back pain    Migraines    OSA (obstructive sleep apnea) 04/10/2017   Personality disorder ([place])    Seizures ([place])    Past Surgical History:  Procedure Laterality Date   COLONOSCOPY     normal   DRUG INDUCED ENDOSCOPY N/A 03/07/2021   Procedure: DRUG INDUCED SLEEP ENDOSCOPY;  Surgeon: Melida Quitter, MD;  Location: [facility];  Service: ENT;  Laterality: N/A;   IMPLANTATION OF HYPOGLOSSAL NERVE STIMULATOR Right  05/16/2021   Procedure: IMPLANTATION OF HYPOGLOSSAL NERVE STIMULATOR;  Surgeon: Melida Quitter, MD;  Location: [facility];  Service: ENT;  Laterality: Right;   TOOTH EXTRACTION  12/27/2014   lower left   TOTAL KNEE ARTHROPLASTY Left 11/28/2020   Procedure: LEFT KNEE PATELLAFEMORL REPLACEMENT;  Surgeon: Melrose Nakayama, MD;  Location: [facility];  Service: Orthopedics;  Laterality: Left;    Allergies  Allergies  Allergen Reactions   Delsym [Dextromethorphan] Other (See Comments)    Gets loopy/memory loss   Aspirin Nausea And Vomiting    GI upset    Mucinex D [Pseudoephedrine-Guaifenesin Er] Hypertension   Ibuprofen Other (See Comments)    Stomach irritation    History of Present Illness    Steven Vincent is a 56 y.o. male who presents via audio/video conferencing for a telehealth visit today.  Pt was last seen in cardiology clinic on 07/24/2021 by Fabian Sharp, [place].  At that time Steven Vincent was doing well. Did report symptoms concerning for angina. Coronary CT angiogram revealed coronary calcium score of 0, no evidence of CAD.  The patient is now pending procedure as outlined above. Since his last visit, he has been stable from a cardiac standpoint.  He notes occasional dyspnea on exertion, unchanged from prior visits. He denies chest pain, palpitations, pnd, orthopnea, n, v, dizziness, syncope, edema, weight gain, or early satiety. All other systems reviewed and are otherwise negative except as noted above.   Home Medications    Prior to Admission medications  Medication Sig Start Date End Date Taking? Authorizing Provider  Ascorbic Acid (VITAMIN C PO) Take 1 tablet by mouth daily.    [provider]  Cholecalciferol (DIALYVITE VITAMIN D 5000) 125 MCG (5000 UT) capsule Take 5,000 Units by mouth daily.    [provider]  diclofenac (FLECTOR) 1.3 % PTCH Place 1 patch onto the skin daily as needed (pain).    [provider]  diclofenac Sodium (VOLTAREN) 1 %  GEL Apply 1 application. topically 4 (four) times daily as needed (pain).    [provider]  DM-APAP-CPM (CORICIDIN HBP PO) Take 2 tablets by mouth 2 (two) times daily as needed (congestion).    [provider]  dronedarone (MULTAQ) 400 MG tablet Take 1 tablet (400 mg total) by mouth 2 (two) times daily with a meal. 03/27/21   Minus Breeding, MD  Erenumab-aooe (AIMOVIG) 140 MG/ML SOAJ Inject 140 mg into the skin every 30 (thirty) days. 05/02/21   Suzzanne Cloud, NP  Melatonin 5 MG CAPS Take 5 mg by mouth at bedtime.    [provider]  meloxicam (MOBIC) 15 MG tablet TAKE 1 TABLET EVERY DAY AS NEEDED FOR PAIN Patient taking differently: Take 15 mg by mouth daily. 10/22/21   Martinique, Betty G, MD  nitroGLYCERIN (NITROSTAT) 0.4 MG SL tablet Place 1 tablet (0.4 mg total) under the tongue every 5 (five) minutes as needed for chest pain. 07/24/21 12/06/21  Ledora Bottcher, PA  pantoprazole (PROTONIX) 20 MG tablet Take 1 tablet (20 mg total) by mouth daily before breakfast. 08/17/21   [place], Betty G, MD  pravastatin (PRAVACHOL) 20 MG tablet Take 1 tablet (20 mg total) by mouth daily. 08/15/21   Minus Breeding, MD  rOPINIRole (REQUIP) 0.5 MG tablet TAKE 1 TABLET AT BEDTIME 10/18/21   Suzzanne Cloud, NP  SUMAtriptan 6 MG/0.5ML SOAJ INJECT 6 MG INTO THE SKIN DAILY AS NEEDED AT MIGRAINE ONSET. MAY REPEAT 1 TIME AFTER 1 HOUR IF NEEDED 05/28/21   Suzzanne Cloud, NP  topiramate (TOPAMAX) 50 MG tablet Take one tablet in the AM and two tablets in the PM. Patient taking differently: Take 100 mg in the morning and 50 mg at night 05/02/21   Suzzanne Cloud, NP  triamcinolone cream (KENALOG) 0.1 % Apply 1 Application topically 2 (two) times daily as needed (rash).    [provider]    Physical Exam    Vital Signs:  Steven Vincent does not have vital signs available for review today.  Given telephonic nature of communication, physical exam is limited. AAOx3. NAD. Normal affect.   Speech and respirations are unlabored.  Accessory Clinical Findings    None  Assessment & Plan    1.  Preoperative Cardiovascular Risk Assessment:  According to the Revised Cardiac Risk Index (RCRI), his Perioperative Risk of Major Cardiac Event is (%): 0.4. His Functional Capacity in METs is: 8.97 according to the Duke Activity Status Index (DASI). Therefore, based on ACC/AHA guidelines, patient would be at acceptable risk for the planned procedure without further cardiovascular testing.The patient was advised that if he develops new symptoms prior to surgery to contact our office to arrange for a follow-up visit, and he verbalized understanding.   A copy of this note will be routed to requesting surgeon.  Time:   Today, I have spent 9 minutes with the patient with telehealth technology discussing medical history, symptoms, and management plan.     Lenna Sciara, NP  12/10/2021, 9:41  AM

## 2021-12-11 NOTE — Patient Instructions (Signed)
SURGICAL WAITING ROOM VISITATION Patients having surgery or a procedure may have no more than 2 support people in the waiting area - these visitors may rotate in the visitor waiting room.   Children under the age of 31 must have an adult with them who is not the patient. If the patient needs to stay at the hospital during part of their recovery, the visitor guidelines for inpatient rooms apply.  PRE-OP VISITATION  Pre-op nurse will coordinate an appropriate time for 1 support person to accompany the patient in pre-op.  This support person may not rotate.  This visitor will be contacted when the time is appropriate for the visitor to come back in the pre-op area.  Please refer to the [facility] website for the visitor guidelines for Inpatients (after your surgery is over and you are in a regular room).  You are not required to quarantine at this time prior to your surgery. However, you must do this: Hand Hygiene often Do NOT share personal items Notify your provider if you are in close contact with someone who has COVID or you develop fever 100.4 or greater, new onset of sneezing, cough, sore throat, shortness of breath or body aches.   If you received a COVID test during your pre-op visit  it is requested that you wear a mask when out in public, stay away from anyone that may not be feeling well and notify your surgeon if you develop symptoms. If you test positive for Covid or have been in contact with anyone that has tested positive in the last 10 days please notify you surgeon.       Your procedure is scheduled on:  Tuesday  December 25, 2021  Report to [facility] Main Entrance.  Report to admitting at:  07:45  AM  +++++Call this number if you have any questions or problems the morning of surgery [phone]  Do not eat food :After Midnight the night prior to your surgery/procedure.  After Midnight you may have the following liquids until  07:00   AM DAY OF SURGERY  Clear  Liquid Diet Water Black Coffee (sugar ok, NO MILK/CREAM OR CREAMERS)  Tea (sugar ok, NO MILK/CREAM OR CREAMERS) regular and decaf                             Plain Jell-O  with no fruit (NO RED)                                           Fruit ices (not with fruit pulp, NO RED)                                     Popsicles (NO RED)                                                                  Juice: apple, WHITE grape, WHITE cranberry Sports drinks like Gatorade or Powerade (NO RED)  The day of surgery:  Drink ONE (1) Pre-Surgery Clear Ensure at   07:00  AM the morning of surgery. Drink in one sitting. Do not sip.  This drink was given to you during your hospital pre-op appointment visit. Nothing else to drink after completing the Pre-Surgery Clear Ensure. No candy, chewing gum or throat lozenges.    FOLLOW ANY ADDITIONAL PRE OP INSTRUCTIONS YOU RECEIVED FROM YOUR SURGEON'S OFFICE!!!   Oral Hygiene is also important to reduce your risk of infection.        Remember - BRUSH YOUR TEETH THE MORNING OF SURGERY WITH YOUR REGULAR TOOTHPASTE  Do NOT smoke after Midnight the night before surgery.  Take ONLY these medicines the morning of surgery with A SIP OF WATER: Dronedarone (Multaq)m Topiramate (Topamax), Pantoprazole (Protonix)  INSPIRE Implant ???? If You have been diagnosed with Sleep Apnea - Bring CPAP mask and tubing day of surgery. We will provide you with a CPAP machine on the day of your surgery.                   You may not have any metal on your body including  jewelry, and body piercing  Do not wear  lotions, powders, cologne, or deodorant  Men may shave face and neck.  Contacts, Hearing Aids, dentures or bridgework may not be worn into surgery.   DO NOT [place]. PHARMACY WILL DISPENSE MEDICATIONS LISTED ON YOUR MEDICATION LIST TO YOU DURING YOUR ADMISSION [place]!   Patients discharged on the day of  surgery will not be allowed to drive home.  Someone NEEDS to stay with you for the first 24 hours after anesthesia.  Special Instructions: Bring a copy of your healthcare power of attorney and living will documents the day of surgery, if you wish to have them scanned into your [facility] Records- EPIC  Please read over the following fact sheets you were given: IF YOU HAVE QUESTIONS ABOUT YOUR PRE-OP INSTRUCTIONS, PLEASE CALL [phone]  ([place])   [place] - Preparing for Surgery Before surgery, you can play an important role.  Because skin is not sterile, your skin needs to be as free of germs as possible.  You can reduce the number of germs on your skin by washing with CHG (chlorahexidine gluconate) soap before surgery.  CHG is an antiseptic cleaner which kills germs and bonds with the skin to continue killing germs even after washing. Please DO NOT use if you have an allergy to CHG or antibacterial soaps.  If your skin becomes reddened/irritated stop using the CHG and inform your nurse when you arrive at Short Stay. Do not shave (including legs and underarms) for at least 48 hours prior to the first CHG shower.  You may shave your face/neck.  Please follow these instructions carefully:  1.  Shower with CHG Soap the night before surgery and the  morning of surgery.  2.  If you choose to wash your hair, wash your hair first as usual with your normal  shampoo.  3.  After you shampoo, rinse your hair and body thoroughly to remove the shampoo.                             4.  Use CHG as you would any other liquid soap.  You can apply chg directly to the skin and wash.  Gently with a scrungie or clean washcloth.  5.  Apply the CHG Soap to your body ONLY FROM THE NECK DOWN.   Do not use on face/ open                           Wound or open sores. Avoid contact with eyes, ears mouth and genitals (private parts).                       Wash face,  Genitals (private parts) with your normal  soap.             6.  Wash thoroughly, paying special attention to the area where your  surgery  will be performed.  7.  Thoroughly rinse your body with warm water from the neck down.  8.  DO NOT shower/wash with your normal soap after using and rinsing off the CHG Soap.            9.  Pat yourself dry with a clean towel.            10.  Wear clean pajamas.            11.  Place clean sheets on your bed the night of your first shower and do not  sleep with pets.  ON THE DAY OF SURGERY : Do not apply any lotions/deodorants the morning of surgery.  Please wear clean clothes to the hospital/surgery center.    FAILURE TO FOLLOW THESE INSTRUCTIONS MAY RESULT IN THE CANCELLATION OF YOUR SURGERY  PATIENT SIGNATURE_________________________________  NURSE SIGNATURE__________________________________  ________________________________________________________________________          Adam Phenix    An incentive spirometer is a tool that can help keep your lungs clear and active. This tool measures how well you are filling your lungs with each breath. Taking long deep breaths may help reverse or decrease the chance of developing breathing (pulmonary) problems (especially infection) following: A long period of time when you are unable to move or be active. BEFORE THE PROCEDURE  If the spirometer includes an indicator to show your best effort, your nurse or respiratory therapist will set it to a desired goal. If possible, sit up straight or lean slightly forward. Try not to slouch. Hold the incentive spirometer in an upright position. INSTRUCTIONS FOR USE  Sit on the edge of your bed if possible, or sit up as far as you can in bed or on a chair. Hold the incentive spirometer in an upright position. Breathe out normally. Place the mouthpiece in your mouth and seal your lips tightly around it. Breathe in slowly and as deeply as possible, raising the piston or the ball toward the top  of the column. Hold your breath for 3-5 seconds or for as long as possible. Allow the piston or ball to fall to the bottom of the column. Remove the mouthpiece from your mouth and breathe out normally. Rest for a few seconds and repeat Steps 1 through 7 at least 10 times every 1-2 hours when you are awake. Take your time and take a few normal breaths between deep breaths. The spirometer may include an indicator to show your best effort. Use the indicator as a goal to work toward during each repetition. After each set of 10 deep breaths, practice coughing to be sure your lungs are clear. If you have an incision (the cut made at the time of surgery), support your incision when coughing by placing a pillow or  rolled up towels firmly against it. Once you are able to get out of bed, walk around indoors and cough well. You may stop using the incentive spirometer when instructed by your caregiver.  RISKS AND COMPLICATIONS Take your time so you do not get dizzy or light-headed. If you are in pain, you may need to take or ask for pain medication before doing incentive spirometry. It is harder to take a deep breath if you are having pain. AFTER USE Rest and breathe slowly and easily. It can be helpful to keep track of a log of your progress. Your caregiver can provide you with a simple table to help with this. If you are using the spirometer at home, follow these instructions: [place] IF:  You are having difficultly using the spirometer. You have trouble using the spirometer as often as instructed. Your pain medication is not giving enough relief while using the spirometer. You develop fever of 100.5 F (38.1 C) or higher.                                                                                                    SEEK IMMEDIATE MEDICAL CARE IF:  You cough up bloody sputum that had not been present before. You develop fever of 102 F (38.9 C) or greater. You develop worsening pain at or  near the incision site. MAKE SURE YOU:  Understand these instructions. Will watch your condition. Will get help right away if you are not doing well or get worse. Document Released: 06/24/2006 Document Revised: 05/06/2011 Document Reviewed: 08/25/2006 [facility] Patient Information 2014 [place], [place].

## 2021-12-11 NOTE — Progress Notes (Signed)
COVID Vaccine received:  '[x]'$  No '[]'$  Yes Date of any COVID positive Test in last 90 days: None  PCP - Betty Martinique, MD Medical clearance 11-19-2021 Epic  Cardiologist - Minus Breeding, MD   Cardiac clearance by Diona Browner, NP  12-10-2021  Epic note  Chest x-ray - 05-16-2021  Epic (2 View ordered by [facility]) at PST EKG -  07-24-2021  Epic Stress Test - 06-2019  Epic ECHO - 04-2019  Epic Cardiac Cath - n/a CTA coronary (Morph/ calcium score of 0)  08-10-2021  Epic  Pacemaker/ICD device     '[x]'$  N/A Spinal Cord Stimulator:'[x]'$  No '[]'$  Yes      (Remind patient to bring remote DOS) Other Implants: INSPIRE hypoglossal Implant  patient had spinal anesthesia for last surgery  History of Sleep Apnea? '[]'$  No '[x]'$  Yes   Sleep Study Date:   CPAP used?- '[x]'$  No '[]'$  Yes  Has INSPIRE  Does the patient monitor blood sugar? '[]'$  No '[]'$  Yes  '[x]'$  N/A  Blood Thinner Instructions:  None Aspirin Instructions: none Last Dose:  ERAS Protocol Ordered: '[]'$  No  '[x]'$  Yes PRE-SURGERY '[x]'$  ENSURE  '[]'$  G2   Comments: patient was seen with his "community Coach"  Liane Comber, [place]. With [employer]  [phone]   Patient has Personality Disorder and he voiced permission for Mr. Freddie Apley to be present during this PST appt. Mr. Freddie Apley will also accompany him on the DOS.   Activity level: Patient can not climb a flight of stairs without difficulty; '[x]'$  No CP  '[x]'$  No SOB,  but would have ______   Anesthesia review: OSA-has Inspire hypoglossal implant, PVCs, Bradycardia, Seizures- Last one was around 2001 per patient (takes Topamax)  Patient denies shortness of breath, fever, cough and chest pain at PAT appointment.  Patient verbalized understanding and agreement to the Pre-Surgical Instructions that were given to them at this PAT appointment. Patient was also educated of the need to review these PAT instructions again prior to his/her surgery.I reviewed the appropriate phone numbers to call if they have any and  questions or concerns.

## 2021-12-13 ENCOUNTER — Encounter (HOSPITAL_COMMUNITY): Payer: Self-pay

## 2021-12-13 ENCOUNTER — Other Ambulatory Visit: Payer: Self-pay

## 2021-12-13 ENCOUNTER — Ambulatory Visit (HOSPITAL_COMMUNITY)
Admission: RE | Admit: 2021-12-13 | Discharge: 2021-12-13 | Disposition: A | Discharge status: Home / Self Care | Payer: Medicare Other | Source: Ambulatory Visit | Attending: Orthopaedic Surgery | Admitting: Orthopaedic Surgery

## 2021-12-13 ENCOUNTER — Encounter (HOSPITAL_COMMUNITY)
Admission: RE | Admit: 2021-12-13 | Discharge: 2021-12-13 | Disposition: A | Discharge status: Home / Self Care | Payer: Medicare Other | Source: Ambulatory Visit | Attending: Orthopaedic Surgery | Admitting: Orthopaedic Surgery

## 2021-12-13 VITALS — BP 123/78 | HR 62 | Temp 98.4°F | Resp 16 | Ht 74.0 in | Wt 265.0 lb

## 2021-12-13 DIAGNOSIS — I2510 Atherosclerotic heart disease of native coronary artery without angina pectoris: Secondary | ICD-10-CM | POA: Insufficient documentation

## 2021-12-13 DIAGNOSIS — Z01812 Encounter for preprocedural laboratory examination: Secondary | ICD-10-CM | POA: Insufficient documentation

## 2021-12-13 DIAGNOSIS — Z01818 Encounter for other preprocedural examination: Secondary | ICD-10-CM

## 2021-12-13 LAB — BASIC METABOLIC PANEL
Anion gap: 8 (ref 5–15)
BUN: 28 mg/dL — ABNORMAL HIGH (ref 6–20)
CO2: 20 mmol/L — ABNORMAL LOW (ref 22–32)
Calcium: 9 mg/dL (ref 8.9–10.3)
Chloride: 110 mmol/L (ref 98–111)
Creatinine, Ser: 1.2 mg/dL (ref 0.61–1.24)
GFR, Estimated: >60 mL/min (ref >60)
Glucose, Bld: 114 mg/dL — ABNORMAL HIGH (ref 70–99)
Potassium: 3.8 mmol/L (ref 3.5–5.1)
Sodium: 138 mmol/L (ref 135–145)

## 2021-12-13 LAB — CBC
HCT: 42 % (ref 39.0–52.0)
Hemoglobin: 14.6 g/dL (ref 13.0–17.0)
MCH: 32.1 pg (ref 26.0–34.0)
MCHC: 34.8 g/dL (ref 30.0–36.0)
MCV: 92.3 fL (ref 80.0–100.0)
Platelets: 172 10*3/uL (ref 150–400)
RBC: 4.55 MIL/uL (ref 4.22–5.81)
RDW: 12.7 % (ref 11.5–15.5)
WBC: 7.8 10*3/uL (ref 4.0–10.5)
nRBC: 0 % (ref 0.0–0.2)

## 2021-12-13 LAB — SURGICAL PCR SCREEN
MRSA, PCR: NEGATIVE
Staphylococcus aureus: NEGATIVE

## 2021-12-18 NOTE — Progress Notes (Signed)
Anesthesia Chart Review   Case: [id number] Date/Time: 12/25/21 0715   Procedure: RIGHT PATELLOFEMORAL REPLACEMENT (Right: Knee)   Anesthesia type: Choice   Pre-op diagnosis: RIGHT KNEE PATELLAFEMORAL DEGENERATIVE JOINT DISEASE   Location: [facility] ROOM 06 / [facility]   Surgeons: Melrose Nakayama, MD       DISCUSSION:56 y.o. former smoker with h/o OSA, PVCs, bradycardia, right knee patellofemoral djd scheduled for above procedure 12/25/2021 with Dr. Melrose Nakayama.   Pt seen by cardiology 10/16/62023. Per OV note, "According to the Revised Cardiac Risk Index (RCRI), his Perioperative Risk of Major Cardiac Event is (%): 0.4. His Functional Capacity in METs is: 8.97 according to the Duke Activity Status Index (DASI). Therefore, based on ACC/AHA guidelines, patient would be at acceptable risk for the planned procedure without further cardiovascular testing.The patient was advised that if he develops new symptoms prior to surgery to contact our office to arrange for a follow-up visit, and he verbalized understanding."  Anticipate pt can proceed with planned procedure barring acute status change.   VS: BP 123/78 Comment: right arm sitting  Pulse 62   Temp 36.9 C   Resp 16   Ht '6\' 2"'$  (1.88 m)   Wt 120.2 kg   SpO2 98%   BMI 34.02 kg/m   PROVIDERS: Martinique, Betty G, MD is PCP   Primary Cardiologist:  Minus Breeding, MD   LABS: Labs reviewed: Acceptable for surgery. (all labs ordered are listed, but only abnormal results are displayed)  Labs Reviewed  BASIC METABOLIC PANEL - Abnormal; Notable for the following components:      Result Value   CO2 20 (*)    Glucose, Bld 114 (*)    BUN 28 (*)    All other components within normal limits  SURGICAL PCR SCREEN  CBC     IMAGES:   EKG:   CV: Echo 04/28/2019 1. Left ventricular ejection fraction, by estimation, is 60 to 65%. Left  ventricular ejection fraction by PLAX is 63 %. The left ventricle has  normal function. The left ventricle has  no regional wall motion  abnormalities. Left ventricular diastolic  parameters are consistent with Grade I diastolic dysfunction (impaired  relaxation). Elevated left ventricular end-diastolic pressure.   2. Right ventricular systolic function is normal. The right ventricular  size is normal. There is normal pulmonary artery systolic pressure.   3. The mitral valve is normal in structure and function. Trivial mitral  valve regurgitation. No evidence of mitral stenosis.   4. The aortic valve is normal in structure and function. Aortic valve  regurgitation is not visualized. No aortic stenosis is present.   5. Aortic dilatation noted. There is mild dilatation of the ascending  aorta measuring 40 mm.   6. The inferior vena cava is normal in size with greater than 50%  respiratory variability, suggesting right atrial pressure of 3 mmHg.  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   Dysrhythmia    bradycardia   Headache(784.0)    HLD (hyperlipidemia)    Knee pain    Low back pain    Migraines    OSA (obstructive sleep apnea) 04/10/2017   Personality disorder ([place])    Seizures ([place])     Past Surgical History:  Procedure Laterality Date   COLONOSCOPY     normal   DRUG INDUCED ENDOSCOPY N/A 03/07/2021   Procedure: DRUG INDUCED SLEEP ENDOSCOPY;  Surgeon: Melida Quitter, MD;  Location: [place];  Service: ENT;  Laterality: N/A;   IMPLANTATION OF HYPOGLOSSAL NERVE STIMULATOR Right 05/16/2021   Procedure: IMPLANTATION OF HYPOGLOSSAL NERVE STIMULATOR;  Surgeon: Melida Quitter, MD;  Location: [place];  Service: ENT;  Laterality: Right;   TOOTH EXTRACTION  12/27/2014   lower left   TOTAL KNEE ARTHROPLASTY Left 11/28/2020   Procedure: LEFT KNEE PATELLAFEMORL REPLACEMENT;  Surgeon: Melrose Nakayama, MD;  Location[facility];  Service: Orthopedics;  Laterality: Left;    MEDICATIONS:  Ascorbic Acid (VITAMIN C PO)    Cholecalciferol (DIALYVITE VITAMIN D 5000) 125 MCG (5000 UT) capsule   diclofenac (FLECTOR) 1.3 % PTCH   diclofenac Sodium (VOLTAREN) 1 % GEL   DM-APAP-CPM (CORICIDIN HBP PO)   dronedarone (MULTAQ) 400 MG tablet   Erenumab-aooe (AIMOVIG) 140 MG/ML SOAJ   Melatonin 5 MG CAPS   meloxicam (MOBIC) 15 MG tablet   nitroGLYCERIN (NITROSTAT) 0.4 MG SL tablet   pantoprazole (PROTONIX) 20 MG tablet   pravastatin (PRAVACHOL) 20 MG tablet   rOPINIRole (REQUIP) 0.5 MG tablet   SUMAtriptan 6 MG/0.5ML SOAJ   topiramate (TOPAMAX) 50 MG tablet   triamcinolone cream (KENALOG) 0.1 %   No current facility-administered medications for this encounter.     Konrad Felix Ward, PA-C [facility] Pre-Surgical Testing [phone]

## 2021-12-24 MED ORDER — TRANEXAMIC ACID 1000 MG/10ML IV SOLN
2000.0000 mg | INTRAVENOUS | Status: DC
  Filled 2021-12-24: qty 20

## 2021-12-24 NOTE — H&P (Signed)
Steven Vincent is an 56 y.o. male.   Chief Complaint: right knee pain HPI: Steven Vincent is in to set up a right knee patellofemoral joint replacement.   He has pain in the right knee which makes it difficult for him to walk very far.  He is about a year out from the left-sided operation which worked out well.   X-ray: X-rays reviewed from the office including multiple views of the right knee demonstrate advanced patellofemoral DJD.  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   Dysrhythmia    bradycardia   Headache(784.0)    HLD (hyperlipidemia)    Knee pain    Low back pain    Migraines    OSA (obstructive sleep apnea) 04/10/2017   Personality disorder ([place])    Seizures ([place])     Past Surgical History:  Procedure Laterality Date   COLONOSCOPY     normal   DRUG INDUCED ENDOSCOPY N/A 03/07/2021   Procedure: DRUG INDUCED SLEEP ENDOSCOPY;  Surgeon: Melida Quitter, MD;  Location: [place];  Service: ENT;  Laterality: N/A;   IMPLANTATION OF HYPOGLOSSAL NERVE STIMULATOR Right 05/16/2021   Procedure: IMPLANTATION OF HYPOGLOSSAL NERVE STIMULATOR;  Surgeon: Melida Quitter, MD;  Location: [facility];  Service: ENT;  Laterality: Right;   TOOTH EXTRACTION  12/27/2014   lower left   TOTAL KNEE ARTHROPLASTY Left 11/28/2020   Procedure: LEFT KNEE PATELLAFEMORL REPLACEMENT;  Surgeon: Melrose Nakayama, MD;  [facility];  Service: Orthopedics;  Laterality: Left;    Family History  Problem Relation Age of Onset   Heart disease Father        defibrilator   Hyperlipidemia Father    Hypertension Father    Alzheimer's disease Father    Other Father        stents in legs   Aneurysm Mother        brain   Hypertension Brother    Stroke Brother        X2   Social History:  reports that he quit smoking about 23 years ago. His smoking use included cigarettes. He has a 1.00 pack-year smoking history. He has never used  smokeless tobacco. He reports that he does not drink alcohol and does not use drugs.  Allergies:  Allergies  Allergen Reactions   Delsym [Dextromethorphan] Other (See Comments)    Gets loopy/memory loss   Aspirin Nausea And Vomiting    GI upset    Mucinex D [Pseudoephedrine-Guaifenesin Er] Hypertension   Ibuprofen Other (See Comments)    Stomach irritation    No medications prior to admission.    No results found for this or any previous visit (from the past 48 hour(s)). No results found.  Review of Systems  Musculoskeletal:  Positive for arthralgias.       Right knee pain  All other systems reviewed and are negative.   There were no vitals taken for this visit. Physical Exam Constitutional:      Appearance: Normal appearance.  HENT:     Head: Normocephalic and atraumatic.     Mouth/Throat:     Mouth: Mucous membranes are moist.     Pharynx: Oropharynx is clear.  Eyes:     Extraocular Movements: Extraocular movements intact.  Cardiovascular:     Rate and Rhythm: Normal rate.  Pulmonary:     Effort: Pulmonary effort is normal.  Abdominal:  Palpations: Abdomen is soft.  Musculoskeletal:     Cervical back: Normal range of motion.     Comments: Both knees move about 0-125.  On the right he has crepitation and pain to patellofemoral compression.  I do not feel an effusion on either side.  Straight leg raise is negative.  Has good hip motion on both sides.    Skin:    General: Skin is warm and dry.  Neurological:     Mental Status: He is alert and oriented to person, place, and time. Mental status is at baseline.  Psychiatric:        Mood and Affect: Mood normal.        Behavior: Behavior normal.        Judgment: Judgment normal.      Assessment/Plan Assessment:  Right knee advanced patellofemoral DJD injected most recently 03/28/21  Plan: Steven Vincent would like to go forward with patellafemoral replacement right knee surgery.  I reviewed risk of anesthesia,  infection, DVT. He is in agreement like to proceed.  Larwance Sachs Natacia Chaisson, PA-C 12/24/2021, 5:38 PM

## 2021-12-25 ENCOUNTER — Ambulatory Visit (HOSPITAL_BASED_OUTPATIENT_CLINIC_OR_DEPARTMENT_OTHER): Payer: Medicare Other | Admitting: Certified Registered"

## 2021-12-25 ENCOUNTER — Encounter (HOSPITAL_COMMUNITY)
Admission: RE | Disposition: A | Discharge status: Home / Self Care | Payer: Self-pay | Source: Ambulatory Visit | Attending: Orthopaedic Surgery

## 2021-12-25 ENCOUNTER — Encounter (HOSPITAL_COMMUNITY): Payer: Self-pay | Admitting: Orthopaedic Surgery

## 2021-12-25 ENCOUNTER — Other Ambulatory Visit: Payer: Self-pay

## 2021-12-25 ENCOUNTER — Ambulatory Visit (HOSPITAL_COMMUNITY)
Admission: RE | Admit: 2021-12-25 | Discharge: 2021-12-25 | Disposition: A | Discharge status: Home / Self Care | Payer: Medicare Other | Source: Ambulatory Visit | Attending: Orthopaedic Surgery | Admitting: Orthopaedic Surgery

## 2021-12-25 ENCOUNTER — Ambulatory Visit (HOSPITAL_COMMUNITY): Payer: Medicare Other | Admitting: Physician Assistant

## 2021-12-25 DIAGNOSIS — M1711 Unilateral primary osteoarthritis, right knee: Secondary | ICD-10-CM | POA: Diagnosis present

## 2021-12-25 DIAGNOSIS — G8918 Other acute postprocedural pain: Secondary | ICD-10-CM | POA: Diagnosis not present

## 2021-12-25 DIAGNOSIS — Z87891 Personal history of nicotine dependence: Secondary | ICD-10-CM | POA: Insufficient documentation

## 2021-12-25 DIAGNOSIS — G4730 Sleep apnea, unspecified: Secondary | ICD-10-CM

## 2021-12-25 DIAGNOSIS — M222X1 Patellofemoral disorders, right knee: Secondary | ICD-10-CM | POA: Diagnosis not present

## 2021-12-25 HISTORY — PX: PATELLA-FEMORAL ARTHROPLASTY: SHX5037

## 2021-12-25 SURGERY — ARTHROPLASTY, PATELLOFEMORAL
Anesthesia: Spinal | Site: Knee | Laterality: Right

## 2021-12-25 MED ORDER — LACTATED RINGERS IV SOLN: INTRAVENOUS | Status: DC

## 2021-12-25 MED ORDER — LACTATED RINGERS IV BOLUS
250.0000 mL | Freq: Once | INTRAVENOUS | Status: AC
  Administered 2021-12-25: 250 mL via INTRAVENOUS

## 2021-12-25 MED ORDER — PROPOFOL 10 MG/ML IV BOLUS
INTRAVENOUS | Status: AC
  Filled 2021-12-25: qty 20

## 2021-12-25 MED ORDER — TRANEXAMIC ACID 1000 MG/10ML IV SOLN
INTRAVENOUS | Status: DC | PRN
  Administered 2021-12-25: 2000 mg via TOPICAL

## 2021-12-25 MED ORDER — MIDAZOLAM HCL 2 MG/2ML IJ SOLN
INTRAMUSCULAR | Status: AC
  Filled 2021-12-25: qty 2

## 2021-12-25 MED ORDER — CEFAZOLIN SODIUM-DEXTROSE 2-4 GM/100ML-% IV SOLN
2.0000 g | INTRAVENOUS | Status: AC
  Administered 2021-12-25: 1 g via INTRAVENOUS
  Administered 2021-12-25: 2 g via INTRAVENOUS
  Filled 2021-12-25: qty 100

## 2021-12-25 MED ORDER — SODIUM CHLORIDE 0.9 % IR SOLN
Status: DC | PRN
  Administered 2021-12-25: 3000 mL

## 2021-12-25 MED ORDER — BUPIVACAINE LIPOSOME 1.3 % IJ SUSP
INTRAMUSCULAR | Status: AC
  Filled 2021-12-25: qty 20

## 2021-12-25 MED ORDER — METHOCARBAMOL 500 MG IVPB - SIMPLE MED: 500.0000 mg | Freq: Four times a day (QID) | INTRAVENOUS | Status: DC | PRN

## 2021-12-25 MED ORDER — PROPOFOL 10 MG/ML IV BOLUS
INTRAVENOUS | Status: DC | PRN
  Administered 2021-12-25: 20 mg via INTRAVENOUS

## 2021-12-25 MED ORDER — MIDAZOLAM HCL 2 MG/2ML IJ SOLN
INTRAMUSCULAR | Status: DC | PRN
  Administered 2021-12-25 (×2): 1 mg via INTRAVENOUS

## 2021-12-25 MED ORDER — TIZANIDINE HCL 4 MG PO TABS: 4.0000 mg | ORAL_TABLET | Freq: Four times a day (QID) | ORAL | 1 refills | Status: AC | PRN

## 2021-12-25 MED ORDER — PROPOFOL 1000 MG/100ML IV EMUL
INTRAVENOUS | Status: AC
  Filled 2021-12-25: qty 100

## 2021-12-25 MED ORDER — SODIUM CHLORIDE (PF) 0.9 % IJ SOLN
INTRAMUSCULAR | Status: DC | PRN
  Administered 2021-12-25: 30 mL

## 2021-12-25 MED ORDER — FENTANYL CITRATE (PF) 100 MCG/2ML IJ SOLN
INTRAMUSCULAR | Status: DC | PRN
  Administered 2021-12-25: 50 ug via INTRAVENOUS

## 2021-12-25 MED ORDER — KETOROLAC TROMETHAMINE 15 MG/ML IJ SOLN: 15.0000 mg | Freq: Four times a day (QID) | INTRAMUSCULAR | Status: DC

## 2021-12-25 MED ORDER — ACETAMINOPHEN 160 MG/5ML PO SOLN: 325.0000 mg | Freq: Once | ORAL | Status: DC | PRN

## 2021-12-25 MED ORDER — TRANEXAMIC ACID-NACL 1000-0.7 MG/100ML-% IV SOLN
1000.0000 mg | INTRAVENOUS | Status: AC
  Administered 2021-12-25: 1000 mg via INTRAVENOUS
  Filled 2021-12-25: qty 100

## 2021-12-25 MED ORDER — BUPIVACAINE-EPINEPHRINE 0.5% -1:200000 IJ SOLN
INTRAMUSCULAR | Status: DC | PRN
  Administered 2021-12-25: 30 mL

## 2021-12-25 MED ORDER — PROPOFOL 500 MG/50ML IV EMUL
INTRAVENOUS | Status: DC | PRN
  Administered 2021-12-25: 40 ug/kg/min via INTRAVENOUS

## 2021-12-25 MED ORDER — BUPIVACAINE-EPINEPHRINE (PF) 0.5% -1:200000 IJ SOLN
INTRAMUSCULAR | Status: AC
  Filled 2021-12-25: qty 30

## 2021-12-25 MED ORDER — BUPIVACAINE IN DEXTROSE 0.75-8.25 % IT SOLN
INTRATHECAL | Status: DC | PRN
  Administered 2021-12-25: 1.6 mL via INTRATHECAL

## 2021-12-25 MED ORDER — METOCLOPRAMIDE HCL 5 MG/ML IJ SOLN: 5.0000 mg | Freq: Three times a day (TID) | INTRAMUSCULAR | Status: DC | PRN

## 2021-12-25 MED ORDER — ROCURONIUM BROMIDE 10 MG/ML (PF) SYRINGE
PREFILLED_SYRINGE | INTRAVENOUS | Status: AC
  Filled 2021-12-25: qty 10

## 2021-12-25 MED ORDER — METOCLOPRAMIDE HCL 5 MG PO TABS: 5.0000 mg | ORAL_TABLET | Freq: Three times a day (TID) | ORAL | Status: DC | PRN

## 2021-12-25 MED ORDER — METHOCARBAMOL 500 MG PO TABS: 500.0000 mg | ORAL_TABLET | Freq: Four times a day (QID) | ORAL | Status: DC | PRN

## 2021-12-25 MED ORDER — ONDANSETRON HCL 4 MG/2ML IJ SOLN
INTRAMUSCULAR | Status: DC | PRN
  Administered 2021-12-25: 4 mg via INTRAVENOUS

## 2021-12-25 MED ORDER — HYDROCODONE-ACETAMINOPHEN 5-325 MG PO TABS: 1.0000 | ORAL_TABLET | Freq: Four times a day (QID) | ORAL | 0 refills | Status: AC | PRN

## 2021-12-25 MED ORDER — ROPIVACAINE HCL 5 MG/ML IJ SOLN
INTRAMUSCULAR | Status: DC | PRN
  Administered 2021-12-25: 30 mL via PERINEURAL

## 2021-12-25 MED ORDER — SODIUM CHLORIDE (PF) 0.9 % IJ SOLN
INTRAMUSCULAR | Status: AC
  Filled 2021-12-25: qty 30

## 2021-12-25 MED ORDER — PROMETHAZINE HCL 25 MG/ML IJ SOLN: 6.2500 mg | INTRAMUSCULAR | Status: DC | PRN

## 2021-12-25 MED ORDER — AMISULPRIDE (ANTIEMETIC) 5 MG/2ML IV SOLN: 10.0000 mg | Freq: Once | INTRAVENOUS | Status: DC | PRN

## 2021-12-25 MED ORDER — ONDANSETRON HCL 4 MG/2ML IJ SOLN
INTRAMUSCULAR | Status: AC
  Filled 2021-12-25: qty 2

## 2021-12-25 MED ORDER — DEXAMETHASONE SODIUM PHOSPHATE 10 MG/ML IJ SOLN
INTRAMUSCULAR | Status: AC
  Filled 2021-12-25: qty 1

## 2021-12-25 MED ORDER — 0.9 % SODIUM CHLORIDE (POUR BTL) OPTIME
TOPICAL | Status: DC | PRN
  Administered 2021-12-25: 1000 mL

## 2021-12-25 MED ORDER — BUPIVACAINE LIPOSOME 1.3 % IJ SUSP
INTRAMUSCULAR | Status: DC | PRN
  Administered 2021-12-25: 20 mL

## 2021-12-25 MED ORDER — LACTATED RINGERS IV BOLUS
500.0000 mL | Freq: Once | INTRAVENOUS | Status: AC
  Administered 2021-12-25: 500 mL via INTRAVENOUS

## 2021-12-25 MED ORDER — ORAL CARE MOUTH RINSE: 15.0000 mL | Freq: Once | OROMUCOSAL | Status: AC

## 2021-12-25 MED ORDER — MEPERIDINE HCL 50 MG/ML IJ SOLN: 6.2500 mg | INTRAMUSCULAR | Status: DC | PRN

## 2021-12-25 MED ORDER — TRANEXAMIC ACID-NACL 1000-0.7 MG/100ML-% IV SOLN
INTRAVENOUS | Status: AC
  Filled 2021-12-25: qty 100

## 2021-12-25 MED ORDER — TRANEXAMIC ACID-NACL 1000-0.7 MG/100ML-% IV SOLN
1000.0000 mg | Freq: Once | INTRAVENOUS | Status: AC
  Administered 2021-12-25: 1000 mg via INTRAVENOUS

## 2021-12-25 MED ORDER — CHLORHEXIDINE GLUCONATE 0.12 % MT SOLN
15.0000 mL | Freq: Once | OROMUCOSAL | Status: AC
  Administered 2021-12-25: 15 mL via OROMUCOSAL

## 2021-12-25 MED ORDER — LIDOCAINE 2% (20 MG/ML) 5 ML SYRINGE
INTRAMUSCULAR | Status: DC | PRN
  Administered 2021-12-25: 40 mg via INTRAVENOUS

## 2021-12-25 MED ORDER — FENTANYL CITRATE (PF) 100 MCG/2ML IJ SOLN
INTRAMUSCULAR | Status: AC
  Filled 2021-12-25: qty 2

## 2021-12-25 MED ORDER — HYDROCODONE-ACETAMINOPHEN 5-325 MG PO TABS: 1.0000 | ORAL_TABLET | ORAL | Status: DC | PRN

## 2021-12-25 MED ORDER — PROPOFOL 500 MG/50ML IV EMUL
INTRAVENOUS | Status: AC
  Filled 2021-12-25: qty 50

## 2021-12-25 MED ORDER — DEXAMETHASONE SODIUM PHOSPHATE 10 MG/ML IJ SOLN
INTRAMUSCULAR | Status: DC | PRN
  Administered 2021-12-25: 4 mg via INTRAVENOUS

## 2021-12-25 MED ORDER — HYDROMORPHONE HCL 1 MG/ML IJ SOLN: 0.2500 mg | INTRAMUSCULAR | Status: DC | PRN

## 2021-12-25 MED ORDER — ACETAMINOPHEN 10 MG/ML IV SOLN: 1000.0000 mg | Freq: Once | INTRAVENOUS | Status: DC | PRN

## 2021-12-25 MED ORDER — ACETAMINOPHEN 325 MG PO TABS: 325.0000 mg | ORAL_TABLET | Freq: Once | ORAL | Status: DC | PRN

## 2021-12-25 MED ORDER — ASPIRIN 81 MG PO TBEC: 81.0000 mg | DELAYED_RELEASE_TABLET | Freq: Two times a day (BID) | ORAL | 0 refills | Status: DC

## 2021-12-25 SURGICAL SUPPLY — 52 items
BAG COUNTER SPONGE SURGICOUNT (BAG) ×1 IMPLANT
BAG DECANTER FOR FLEXI CONT (MISCELLANEOUS) ×1 IMPLANT
BAG ZIPLOCK 12X15 (MISCELLANEOUS) ×1 IMPLANT
BLADE SAGITTAL 25.0X1.19X90 (BLADE) ×1 IMPLANT
BLADE SAW SGTL 11.0X1.19X90.0M (BLADE) ×1 IMPLANT
BLADE SURG SZ10 CARB STEEL (BLADE) ×1 IMPLANT
BNDG ELASTIC 6X5.8 VLCR STR LF (GAUZE/BANDAGES/DRESSINGS) ×1 IMPLANT
BOOTIES KNEE HIGH SLOAN (MISCELLANEOUS) ×1 IMPLANT
BOWL SMART MIX CTS (DISPOSABLE) ×1 IMPLANT
BUR SURG PFJ MILL NEXGEN (BURR) IMPLANT
BURR SURG PFJ MILL NEXGEN (BURR) ×1
CEMENT HV SMART SET (Cement) ×2 IMPLANT
COMP FEM NEXGEN SZ4 +3.5 RT (Knees) ×1 IMPLANT
COMPONENT FEM NEXGN SZ4 +3.5RT (Knees) IMPLANT
COVER SURGICAL LIGHT HANDLE (MISCELLANEOUS) ×1 IMPLANT
CUFF TOURN SGL QUICK 34 (TOURNIQUET CUFF) ×1
CUFF TRNQT CYL 34X4.125X (TOURNIQUET CUFF) ×1 IMPLANT
DRAPE INCISE IOBAN 66X45 STRL (DRAPES) ×1 IMPLANT
DRAPE SHEET LG 3/4 BI-LAMINATE (DRAPES) ×1 IMPLANT
DRAPE TOP 10253 STERILE (DRAPES) ×1 IMPLANT
DRAPE U-SHAPE 47X51 STRL (DRAPES) ×1 IMPLANT
DRSG AQUACEL AG ADV 3.5X10 (GAUZE/BANDAGES/DRESSINGS) ×1 IMPLANT
DURAPREP 26ML APPLICATOR (WOUND CARE) ×2 IMPLANT
ELECT REM PT RETURN 15FT ADLT (MISCELLANEOUS) ×1 IMPLANT
GLOVE BIO SURGEON STRL SZ8 (GLOVE) ×2 IMPLANT
GLOVE BIOGEL PI IND STRL 8 (GLOVE) ×2 IMPLANT
GOWN STRL REUS W/ TWL XL LVL3 (GOWN DISPOSABLE) ×2 IMPLANT
GOWN STRL REUS W/TWL XL LVL3 (GOWN DISPOSABLE) ×2
HANDPIECE INTERPULSE COAX TIP (DISPOSABLE) ×1
HOLDER FOLEY CATH W/STRAP (MISCELLANEOUS) IMPLANT
HOOD PEEL AWAY FLYTE STAYCOOL (MISCELLANEOUS) ×3 IMPLANT
KIT TURNOVER KIT A (KITS) IMPLANT
MANIFOLD NEPTUNE II (INSTRUMENTS) ×1 IMPLANT
NEEDLE HYPO 22GX1.5 SAFETY (NEEDLE) ×1 IMPLANT
NS IRRIG 1000ML POUR BTL (IV SOLUTION) ×1 IMPLANT
PACK TOTAL KNEE CUSTOM (KITS) ×1 IMPLANT
PAD ARMBOARD 7.5X6 YLW CONV (MISCELLANEOUS) ×1 IMPLANT
PROTECTOR NERVE ULNAR (MISCELLANEOUS) ×1 IMPLANT
SCREW HEADED 33MM KNEE (MISCELLANEOUS) IMPLANT
SET HNDPC FAN SPRY TIP SCT (DISPOSABLE) ×1 IMPLANT
SPIKE FLUID TRANSFER (MISCELLANEOUS) ×2 IMPLANT
STEM POLY PAT PLY 38M KNEE (Knees) IMPLANT
SUT ETHIBOND NAB CT1 #1 30IN (SUTURE) ×2 IMPLANT
SUT VIC AB 0 CT1 36 (SUTURE) ×1 IMPLANT
SUT VIC AB 2-0 CT1 27 (SUTURE) ×1
SUT VIC AB 2-0 CT1 TAPERPNT 27 (SUTURE) ×1 IMPLANT
SUT VICRYL AB 3-0 FS1 BRD 27IN (SUTURE) ×1 IMPLANT
SUT VLOC 180 0 24IN GS25 (SUTURE) ×1 IMPLANT
TRAY FOLEY MTR SLVR 16FR STAT (SET/KITS/TRAYS/PACK) IMPLANT
WATER STERILE IRR 1000ML POUR (IV SOLUTION) ×1 IMPLANT
WRAP KNEE MAXI GEL POST OP (GAUZE/BANDAGES/DRESSINGS) ×1 IMPLANT
YANKAUER SUCT BULB TIP NO VENT (SUCTIONS) ×1 IMPLANT

## 2021-12-25 NOTE — Interval H&P Note (Signed)
History and Physical Interval Note:  12/25/2021 7:22 AM  Steven Vincent  has presented today for surgery, with the diagnosis of RIGHT KNEE PATELLAFEMORAL DEGENERATIVE JOINT DISEASE.  The various methods of treatment have been discussed with the patient and family. After consideration of risks, benefits and other options for treatment, the patient has consented to  Procedure(s): RIGHT PATELLOFEMORAL REPLACEMENT (Right) as a surgical intervention.  The patient's history has been reviewed, patient examined, no change in status, stable for surgery.  I have reviewed the patient's chart and labs.  Questions were answered to the patient's satisfaction.     Hessie Dibble

## 2021-12-25 NOTE — Evaluation (Signed)
Physical Therapy Evaluation Patient Details Name: Steven Vincent MRN: [id number] DOB: [birth year] Today's Date: 12/25/2021  History of Present Illness  Pt is a 56yo male presenting s/p R patellofemoral replacement on 12/25/21.  PMH: baker's cyst, HLD, low back pain, OSA, seizures, L-TKA 2022.   Clinical Impression  Steven Vincent is a 56 y.o. male POD 0 s/p the procedure above. Patient reports IND with mobility at baseline. Patient is now limited by functional impairments (see PT problem list below) and requires min guard for transfers and gait with RW. Patient was able to ambulate 30 feet with RW and min guard and cues for safe walker management. Patient educated on safe sequencing for stair mobility and verbalized safe guarding position for people assisting with mobility. Patient instructed in exercises to facilitate ROM and circulation. Patient will benefit from continued skilled PT interventions to address impairments and progress towards PLOF. Patient has met mobility goals at adequate level for discharge home; will continue to follow if pt continues acute stay to progress towards Mod I goals.       Recommendations for follow up therapy are one component of a multi-disciplinary discharge planning process, led by the attending physician.  Recommendations may be updated based on patient status, additional functional criteria and insurance authorization.  Follow Up Recommendations Follow physician's recommendations for discharge plan and follow up therapies      Assistance Recommended at Discharge Frequent or constant Supervision/Assistance  Patient can return home with the following  A little help with walking and/or transfers;A little help with bathing/dressing/bathroom;Assistance with cooking/housework;Assist for transportation;Help with stairs or ramp for entrance    Equipment Recommendations None recommended by PT  Recommendations for Other Services       Functional Status  Assessment Patient has had a recent decline in their functional status and demonstrates the ability to make significant improvements in function in a reasonable and predictable amount of time.     Precautions / Restrictions Precautions Precautions: Knee Precaution Booklet Issued: No Precaution Comments: no pillow under the knee Restrictions Weight Bearing Restrictions: No Other Position/Activity Restrictions: wbat      Mobility  Bed Mobility Overal bed mobility: Needs Assistance Bed Mobility: Supine to Sit     Supine to sit: Supervision     General bed mobility comments: For safety only    Transfers Overall transfer level: Needs assistance Equipment used: Rolling walker (2 wheels) Transfers: Sit to/from Stand Sit to Stand: Min guard, From elevated surface           General transfer comment: Min guard for safety only from stretcher, VCs for powering up from bed    Ambulation/Gait Ambulation/Gait assistance: Min guard Gait Distance (Feet): 30 Feet Assistive device: Rolling walker (2 wheels) Gait Pattern/deviations: Step-to pattern Gait velocity: decreased     General Gait Details: Pt ambulated with RW and min guard, no physical assist required or overt LOB noted.  Stairs Stairs: Yes Stairs assistance: Min assist, +2 safety/equipment Stair Management: No rails, Step to pattern, Backwards, With walker Number of Stairs: 2 General stair comments: Pt educated on stair mobility with RW backwards with min assist from caregiver, verbalized undestanding. Pt completed stair mobility with min assist fo rsteadying RW, caregiver provided min assist PT providing min guard. No overt LOB noted.  Wheelchair Mobility    Modified Rankin (Stroke Patients Only)       Balance Overall balance assessment: Needs assistance Sitting-balance support: Feet supported, No upper extremity supported Sitting balance-Leahy Scale: Good  Standing balance support: Reliant on assistive  device for balance, During functional activity, Bilateral upper extremity supported Standing balance-Leahy Scale: Fair Standing balance comment: Pt able to stand statically without BUE support during pericare.                             Pertinent Vitals/Pain Pain Assessment Pain Assessment: No/denies pain    Home Living Family/patient expects to be discharged to:: Private residence Living Arrangements: Non-relatives/Friends (Roommate) Available Help at Discharge: Friend(s);Available PRN/intermittently Type of Home: Apartment Home Access: Stairs to enter Entrance Stairs-Rails: Right;Left Entrance Stairs-Number of Steps: 2   Home Layout: One level Home Equipment: Grab bars - tub/shower;Rolling Walker (2 wheels);Cane - single point      Prior Function Prior Level of Function : Independent/Modified Independent;Driving;Working/employed             Mobility Comments: IND ADLs Comments: IND     Hand Dominance        Extremity/Trunk Assessment   Upper Extremity Assessment Upper Extremity Assessment: Overall WFL for tasks assessed    Lower Extremity Assessment Lower Extremity Assessment: RLE deficits/detail;LLE deficits/detail RLE Deficits / Details: MMT ank DF/PF 5/5 no extensor lag noted RLE Sensation: WNL LLE Deficits / Details: MMT ank DF/PF 5/5 LLE Sensation: WNL    Cervical / Trunk Assessment Cervical / Trunk Assessment: Normal  Communication   Communication: No difficulties  Cognition Arousal/Alertness: Awake/alert Behavior During Therapy: WFL for tasks assessed/performed Overall Cognitive Status: Within Functional Limits for tasks assessed                                          General Comments General comments (skin integrity, edema, etc.): Friend Brad present    Exercises Total Joint Exercises Ankle Circles/Pumps: AROM, Both, 10 reps Quad Sets: AROM, Right, Other reps (comment) (3) Short Arc Quad: AROM, Right, Other  reps (comment) (3) Heel Slides: AROM, Right, Other reps (comment) (3) Hip ABduction/ADduction: AROM, Right, Other reps (comment) (3) Straight Leg Raises: AROM, Right, Other reps (comment) (3) Long Arc Quad: Other (comment) (educated not performed) Goniometric ROM: -5-80deg by gross visual approximation   Assessment/Plan    PT Assessment Patient needs continued PT services  PT Problem List Decreased strength;Decreased range of motion;Decreased activity tolerance;Decreased balance;Decreased mobility;Decreased coordination;Pain       PT Treatment Interventions DME instruction;Gait training;Stair training;Functional mobility training;Therapeutic activities;Therapeutic exercise;Balance training;Neuromuscular re-education;Patient/family education    PT Goals (Current goals can be found in the Care Plan section)  Acute Rehab PT Goals Patient Stated Goal: Go back to work PT Goal Formulation: With patient Time For Goal Achievement: 01/01/22 Potential to Achieve Goals: Good    Frequency 7X/week     Co-evaluation               AM-PAC PT "6 Clicks" Mobility  Outcome Measure Help needed turning from your back to your side while in a flat bed without using bedrails?: None Help needed moving from lying on your back to sitting on the side of a flat bed without using bedrails?: A Little Help needed moving to and from a bed to a chair (including a wheelchair)?: A Little Help needed standing up from a chair using your arms (e.g., wheelchair or bedside chair)?: A Little Help needed to walk in hospital room?: A Little Help needed climbing 3-5 steps with a railing? :  A Little 6 Click Score: 19    End of Session Equipment Utilized During Treatment: Gait belt Activity Tolerance: Patient tolerated treatment well;No increased pain Patient left: in chair;with call bell/phone within reach;with family/visitor present Nurse Communication: Mobility status PT Visit Diagnosis: Difficulty in walking,  not elsewhere classified (R26.2)    Time: 1694-5038 PT Time Calculation (min) (ACUTE ONLY): 29 min   Charges:   PT Evaluation $PT Eval Low Complexity: 1 Low PT Treatments $Gait Training: 8-22 mins        Coolidge Breeze, PT, DPT [facility] Office: [phone] Weekend pager: [phone]  Coolidge Breeze 12/25/2021, 2:37 PM

## 2021-12-25 NOTE — Anesthesia Procedure Notes (Signed)
Procedure Name: MAC Date/Time: 12/25/2021 7:30 AM  Performed by: Eben Burow, CRNAPre-anesthesia Checklist: Patient identified, Emergency Drugs available, Suction available, Patient being monitored and Timeout performed Oxygen Delivery Method: Simple face mask Placement Confirmation: positive ETCO2

## 2021-12-25 NOTE — Anesthesia Procedure Notes (Signed)
Spinal  Start time: 12/25/2021 7:32 AM End time: 12/25/2021 7:35 AM Reason for block: surgical anesthesia Staffing Performed: anesthesiologist  Anesthesiologist: Effie Berkshire, MD Performed by: Effie Berkshire, MD Authorized by: Effie Berkshire, MD   Preanesthetic Checklist Completed: patient identified, IV checked, site marked, risks and benefits discussed, surgical consent, monitors and equipment checked, pre-op evaluation and timeout performed Spinal Block Patient position: sitting Prep: DuraPrep and site prepped and draped Location: L3-4 Injection technique: single-shot Needle Needle type: Pencan  Needle gauge: 24 G Needle length: 10 cm Needle insertion depth: 10 cm Additional Notes Patient tolerated well. No immediate complications.  Functioning IV was confirmed and monitors were applied. Sterile prep and drape, including hand hygiene and sterile gloves were used. The patient was positioned and the back was prepped. The skin was anesthetized with lidocaine. Free flow of clear CSF was obtained prior to injecting local anesthetic into the CSF. The spinal needle aspirated freely following injection. The needle was carefully withdrawn. The patient tolerated the procedure well.

## 2021-12-25 NOTE — Op Note (Signed)
PREOP DIAGNOSIS: DJD RIGHT KNEE patellofemoral POSTOP DIAGNOSIS: same PROCEDURE: RIGHT patellofemoral replacement ANESTHESIA: Spinal and MAC ATTENDING SURGEON: Hessie Dibble ASSISTANT: Loni Dolly PA  INDICATIONS FOR PROCEDURE: Anthone Prieur is a 56 y.o. male who has struggled for a long time with pain due to degenerative arthritis of the right knee isolated to the patellofemoral joint.  The patient has failed many conservative non-operative measures and at this point has pain which limits the ability to sleep and walk.  The patient is offered patellofemoral replacement.  Informed operative consent was obtained after discussion of possible risks of anesthesia, infection, neurovascular injury, DVT, and death.  The importance of the post-operative rehabilitation protocol to optimize result was stressed extensively with the patient.  SUMMARY OF FINDINGS AND PROCEDURE:  Hakan Nudelman was taken to the operative suite where under the above anesthesia a right knee patellofemoral replacement was performed.  There were advanced degenerative changes and the bone quality was excellent.  We used the [employer] system and placed size 4 patellofemoral component and  38 mm all polyethylene Persona patella, Loni Dolly PA-C assisted throughout and was invaluable to the completion of the case in that he helped retract and maintain exposure while I placed components.  He also helped close thereby minimizing OR time.  The patient was admitted for appropriate post-op care to include perioperative antibiotics and mechanical and pharmacologic measures for DVT prophylaxis.  DESCRIPTION OF PROCEDURE:  Ra Pfiester was taken to the operative suite where the above anesthesia was applied.  The patient was positioned supine and prepped and draped in normal sterile fashion.  An appropriate time out was performed.  After the administration of kefzol pre-op antibiotic the leg was elevated and  exsanguinated and a tourniquet inflated. A standard longitudinal incision was made on the anterior knee.  Dissection was carried down to the extensor mechanism.  All appropriate anti-infective measures were used including the pre-operative antibiotic, betadine impregnated drape, and closed hooded exhaust systems for each member of the surgical team.  A medial parapatellar incision was made in the extensor  An intramedullary guide was placed in the femur and was utilized to make an anterior cut flush with the anterior cortex.  Another guide was placed to create cuts to recess a size 4 component appropriately. We then cut the patella with the guide and sized to 38 with drill holes made. A trial reduction was done and the knee easily came to full extension and the patella tracked well on flexion.  The trial components were removed and all bones were cleaned with pulsatile lavage and then dried thoroughly.  Cement was mixed and was pressurized onto the bones followed by placement of the aforementioned components.  Excess cement was trimmed and pressure was held on the components until the cement had hardened.  The tourniquet was deflated and a small amount of bleeding was controlled with cautery and pressure.  The knee was irrigated thoroughly.  The extensor mechanism was re-approximated with #1 ethibond in interrupted fashion.  The knee was flexed and the repair was solid.  The subcutaneous tissues were re-approximated with #0 and #2-0 vicryl and the skin closed with a subcuticular stitch and steristrips.  A sterile dressing was applied.  Intraoperative fluids, EBL, and tourniquet time can be obtained from anesthesia records.  DISPOSITION:  The patient was taken to recovery room in stable condition and scheduled to potentially go home same day depending on ability to walk and tolerate liquids.Collier Salina  G Annmargaret Decaprio 12/25/2021, 8:55 AM

## 2021-12-25 NOTE — Transfer of Care (Signed)
Immediate Anesthesia Transfer of Care Note  Patient: Steven Vincent  Procedure(s) Performed: RIGHT PATELLOFEMORAL REPLACEMENT (Right: Knee)  Patient Location: PACU  Anesthesia Type:Spinal  Level of Consciousness: drowsy and responds to stimulation  Airway & Oxygen Therapy: Patient Spontanous Breathing and Patient connected to face mask oxygen  Post-op Assessment: Report given to RN and Post -op Vital signs reviewed and stable  Post vital signs: Reviewed and stable  Last Vitals:  Vitals Value Taken Time  BP 116/75   Temp    Pulse 61   Resp 12   SpO2 100     Last Pain:  Vitals:   12/25/21 0608  TempSrc: Oral         Complications: No notable events documented.

## 2021-12-25 NOTE — Anesthesia Procedure Notes (Signed)
Anesthesia Regional Block: Adductor canal block   Pre-Anesthetic Checklist: , timeout performed,  Correct Patient, Correct Site, Correct Laterality,  Correct Procedure, Correct Position, site marked,  Risks and benefits discussed,  Surgical consent,  Pre-op evaluation,  At surgeon's request and post-op pain management  Laterality: Right  Prep: chloraprep       Needles:  Injection technique: Single-shot  Needle Type: Echogenic Stimulator Needle     Needle Length: 9cm  Needle Gauge: 21     Additional Needles:   Procedures:,,,, ultrasound used (permanent image in chart),,    Narrative:  Start time: 12/25/2021 7:05 AM End time: 12/25/2021 7:10 AM Injection made incrementally with aspirations every 5 mL.  Performed by: Personally  Anesthesiologist: Effie Berkshire, MD  Additional Notes: Patient tolerated the procedure well. Local anesthetic introduced in an incremental fashion under minimal resistance after negative aspirations. No paresthesias were elicited. After completion of the procedure, no acute issues were identified and patient continued to be monitored by RN.

## 2021-12-25 NOTE — Anesthesia Preprocedure Evaluation (Addendum)
Anesthesia Evaluation  Patient identified by MRN, date of birth, ID band Patient awake    Reviewed: Allergy & Precautions, NPO status , Patient's Chart, lab work & pertinent test results  Airway Mallampati: II  TM Distance: >3 FB Neck ROM: Full    Dental  (+) Missing,    Pulmonary sleep apnea , former smoker,    breath sounds clear to auscultation       Cardiovascular + dysrhythmias  Rhythm:Regular Rate:Normal  Stress Test .  Fair exercise capacity, achieved 7.0 METS .  Did not meet target heart rate, max HR 129 bpm (78% max age predicted HR) .  Normal BP response to exercise .  No ST deviation was noted. .  Frequent PVCs during stress and recovery .  Nondiagnostic study due to failure to meet target heart rate    Neuro/Psych  Headaches, Seizures -,  PSYCHIATRIC DISORDERS    GI/Hepatic negative GI ROS, Neg liver ROS,   Endo/Other  negative endocrine ROS  Renal/GU negative Renal ROS     Musculoskeletal  (+) Arthritis ,   Abdominal Normal abdominal exam  (+)   Peds  Hematology negative hematology ROS (+)   Anesthesia Other Findings   Reproductive/Obstetrics                            Anesthesia Physical Anesthesia Plan  ASA: 3  Anesthesia Plan: Spinal   Post-op Pain Management: Regional block*   Induction: Intravenous  PONV Risk Score and Plan: 2 and Ondansetron, Dexamethasone and Midazolam  Airway Management Planned: Natural Airway and Simple Face Mask  Additional Equipment: None  Intra-op Plan:   Post-operative Plan:   Informed Consent: I have reviewed the patients History and Physical, chart, labs and discussed the procedure including the risks, benefits and alternatives for the proposed anesthesia with the patient or authorized representative who has indicated his/her understanding and acceptance.       Plan Discussed with: CRNA  Anesthesia Plan Comments: (Consented  for GA   Lab Results      Component                Value               Date                      WBC                      7.8                 12/13/2021                HGB                      14.6                12/13/2021                HCT                      42.0                12/13/2021                MCV                      92.3  12/13/2021                PLT                      172                 12/13/2021           )       Anesthesia Quick Evaluation

## 2021-12-25 NOTE — Anesthesia Postprocedure Evaluation (Signed)
Anesthesia Post Note  Patient: Steven Vincent  Procedure(s) Performed: RIGHT PATELLOFEMORAL REPLACEMENT (Right: Knee)     Patient location during evaluation: PACU Anesthesia Type: Spinal Level of consciousness: oriented and awake and alert Pain management: pain level controlled Vital Signs Assessment: post-procedure vital signs reviewed and stable Respiratory status: spontaneous breathing, respiratory function stable and patient connected to nasal cannula oxygen Cardiovascular status: blood pressure returned to baseline and stable Postop Assessment: no headache, no backache, no apparent nausea or vomiting and spinal receding Anesthetic complications: no   No notable events documented.  Last Vitals:  Vitals:   12/25/21 1015 12/25/21 1030  BP: 119/82 127/83  Pulse: (!) 54 (!) 55  Resp: 19 13  Temp:    SpO2: 99% 99%    Last Pain:  Vitals:   12/25/21 1030  TempSrc:   PainSc: 0-No pain                 Effie Berkshire

## 2021-12-26 ENCOUNTER — Encounter (HOSPITAL_COMMUNITY): Payer: Self-pay | Admitting: Orthopaedic Surgery

## 2021-12-28 DIAGNOSIS — Z85828 Personal history of other malignant neoplasm of skin: Secondary | ICD-10-CM | POA: Diagnosis not present

## 2021-12-28 DIAGNOSIS — Z9181 History of falling: Secondary | ICD-10-CM | POA: Diagnosis not present

## 2021-12-28 DIAGNOSIS — Z87891 Personal history of nicotine dependence: Secondary | ICD-10-CM | POA: Diagnosis not present

## 2021-12-28 DIAGNOSIS — Z471 Aftercare following joint replacement surgery: Secondary | ICD-10-CM | POA: Diagnosis not present

## 2021-12-28 DIAGNOSIS — G4733 Obstructive sleep apnea (adult) (pediatric): Secondary | ICD-10-CM | POA: Diagnosis not present

## 2021-12-28 DIAGNOSIS — M222X1 Patellofemoral disorders, right knee: Secondary | ICD-10-CM | POA: Diagnosis not present

## 2021-12-28 DIAGNOSIS — E785 Hyperlipidemia, unspecified: Secondary | ICD-10-CM | POA: Diagnosis not present

## 2021-12-28 DIAGNOSIS — Z96651 Presence of right artificial knee joint: Secondary | ICD-10-CM | POA: Diagnosis not present

## 2021-12-28 DIAGNOSIS — Z7982 Long term (current) use of aspirin: Secondary | ICD-10-CM | POA: Diagnosis not present

## 2021-12-28 DIAGNOSIS — Z9889 Other specified postprocedural states: Secondary | ICD-10-CM | POA: Diagnosis not present

## 2021-12-28 DIAGNOSIS — Z96652 Presence of left artificial knee joint: Secondary | ICD-10-CM | POA: Diagnosis not present

## 2021-12-28 DIAGNOSIS — Z792 Long term (current) use of antibiotics: Secondary | ICD-10-CM | POA: Diagnosis not present

## 2021-12-28 DIAGNOSIS — M7120 Synovial cyst of popliteal space [Baker], unspecified knee: Secondary | ICD-10-CM | POA: Diagnosis not present

## 2021-12-28 DIAGNOSIS — G43909 Migraine, unspecified, not intractable, without status migrainosus: Secondary | ICD-10-CM | POA: Diagnosis not present

## 2021-12-31 ENCOUNTER — Telehealth: Payer: Self-pay | Admitting: Pulmonary Disease

## 2021-12-31 DIAGNOSIS — Z87891 Personal history of nicotine dependence: Secondary | ICD-10-CM | POA: Diagnosis not present

## 2021-12-31 DIAGNOSIS — Z471 Aftercare following joint replacement surgery: Secondary | ICD-10-CM | POA: Diagnosis not present

## 2021-12-31 DIAGNOSIS — Z85828 Personal history of other malignant neoplasm of skin: Secondary | ICD-10-CM | POA: Diagnosis not present

## 2021-12-31 DIAGNOSIS — G43909 Migraine, unspecified, not intractable, without status migrainosus: Secondary | ICD-10-CM | POA: Diagnosis not present

## 2021-12-31 DIAGNOSIS — Z96651 Presence of right artificial knee joint: Secondary | ICD-10-CM | POA: Diagnosis not present

## 2021-12-31 DIAGNOSIS — E785 Hyperlipidemia, unspecified: Secondary | ICD-10-CM | POA: Diagnosis not present

## 2021-12-31 DIAGNOSIS — M7120 Synovial cyst of popliteal space [Baker], unspecified knee: Secondary | ICD-10-CM | POA: Diagnosis not present

## 2021-12-31 DIAGNOSIS — Z7982 Long term (current) use of aspirin: Secondary | ICD-10-CM | POA: Diagnosis not present

## 2021-12-31 DIAGNOSIS — Z9181 History of falling: Secondary | ICD-10-CM | POA: Diagnosis not present

## 2021-12-31 DIAGNOSIS — Z792 Long term (current) use of antibiotics: Secondary | ICD-10-CM | POA: Diagnosis not present

## 2021-12-31 DIAGNOSIS — Z96652 Presence of left artificial knee joint: Secondary | ICD-10-CM | POA: Diagnosis not present

## 2021-12-31 DIAGNOSIS — G4733 Obstructive sleep apnea (adult) (pediatric): Secondary | ICD-10-CM | POA: Diagnosis not present

## 2021-12-31 NOTE — Telephone Encounter (Signed)
ATC LVMTCB x 1  

## 2021-12-31 NOTE — Telephone Encounter (Signed)
Called and spoke with patient, advised him of recommendations per Dr. [name].  He verbalized understanding.  I let him know that Mel Almond would reach out to him to get him scheduled to see Dr. [name] at [facility] and provided the address to him ([place], [place] [id number], [address].  I also let him know that the [employer] rep will be part of the visit to try to figure out what is going on with his device.

## 2021-12-31 NOTE — Telephone Encounter (Signed)
Appt date/time is good w/ [employer] Rep notified pt of appt.  Pt verbalized understanding & nothing further needed at this time.

## 2021-12-31 NOTE — Telephone Encounter (Addendum)
I have tentatively scheduled this at [facility] tomorrow at 10:45.  I have left a VM w/ Chris from [place] [facility] to see if they can also be there.  I will contact the patient & let him know once I've heard from [place].

## 2021-12-31 NOTE — Telephone Encounter (Signed)
Pt called the office back.   States yesterday after church, he laid down to take a nap and while taking a nap, he got shocked from his inspire device. States that after that happened, he could not speak.  Pt said when he turned the inspire off, he was able to speak again.  Pt did have knee surgery 10/31 and he said they knew all about the inspire device and what they needed to do for it while he was having the surgery.  Pt said today when he woke up, he woke up with a headache.  Due to all that has been happening, pt wants to know what we recommend.  Dr. [name], please advise.

## 2021-12-31 NOTE — Telephone Encounter (Signed)
He needs to come in for an ROV with me or one of the NPs to have the device interrogated.  The appointment needs to be coordinated so the [facility] rep is present during the time of his visit.  He should not turn on the [place] device until it can be interrogated.

## 2022-01-01 ENCOUNTER — Ambulatory Visit (INDEPENDENT_AMBULATORY_CARE_PROVIDER_SITE_OTHER): Payer: Medicare Other | Admitting: Pulmonary Disease

## 2022-01-01 ENCOUNTER — Encounter (HOSPITAL_BASED_OUTPATIENT_CLINIC_OR_DEPARTMENT_OTHER): Payer: Self-pay | Admitting: Pulmonary Disease

## 2022-01-01 VITALS — BP 142/88 | HR 78 | Temp 99.0°F | Ht 74.0 in | Wt 267.0 lb

## 2022-01-01 DIAGNOSIS — G4733 Obstructive sleep apnea (adult) (pediatric): Secondary | ICD-10-CM

## 2022-01-01 NOTE — Progress Notes (Signed)
[facility]  Chief Complaint  Patient presents with   Follow-up    Follow up on [place] him.    Constitutional:  BP (!) 142/88 (BP Location: Right Arm, Patient Position: Sitting, Cuff Size: Large)   Pulse 78   Temp 99 F (37.2 C) (Oral)   Ht '6\' 2"'$  (1.88 m)   Wt 267 lb (121.1 kg)   SpO2 97%   BMI 34.28 kg/m   Past Medical History:  HA, Low back pain, Migraine HA, Seizures, Personality disorder  Past Surgical History:  His  has a past surgical history that includes Tooth extraction (12/27/2014); Total knee arthroplasty (Left, 11/28/2020); Drug induced endoscopy (N/A, 03/07/2021); Colonoscopy; Implantation of hypoglossal nerve stimulator (Right, 05/16/2021); and Patella-femoral arthroplasty (Right, 12/25/2021).  Brief Summary:  Steven Vincent is a 56 y.o. male former smoker with obstructive sleep apnea.  Intolerant of CPAP and had Inspire device implanted in May 2023.      Subjective:   He took a nap after church.  When he woke up he couldn't speak.  He turned off the [place] device and was able to speak.  He turned the Inspire device on again before going to bed on 12/30/21.  When he woke up he had a headache.  He had knee surgery on 12/25/21.  Inspire representative interrogated his device and function is normal.  He reports that his concerned about electric set up in his apartment.  He has discussed this with his landlord previously. He has an electric adjustable bed.  He felt the shock in his chest on the opposite side of his device.    Physical Exam:   Appearance - well kempt   ENMT - no sinus tenderness, no oral exudate, no LAN, Mallampati 4 airway, no stridor  Respiratory - equal breath sounds bilaterally, no wheezing or rales  CV - s1s2 regular rate and rhythm, no murmurs  Ext - no clubbing, no edema  Skin - no rashes  Psych - normal mood and affect    Sleep Tests:  PSG 02/15/17 >> AHI 6.9,  SpO2 low 85%.  Supine AHI 10.5, REM AHI 24.3. HST 02/26/18 >> AHI 11.6, SpO2 low 79% HST 07/07/18 >> AHI 7.7, SpO2 low 83% PSG 08/15/21 >> optimal level 0.9 V  Cardiac Tests:  Echo 04/28/19 >> EF 60 to 65%, grade 1 DD, ascending aorta 40 mm  Social History:  He  reports that he quit smoking about 23 years ago. His smoking use included cigarettes. He has a 1.00 pack-year smoking history. He has never used smokeless tobacco. He reports that he does not drink alcohol and does not use drugs.  Family History:  His family history includes Alzheimer's disease in his father; Aneurysm in his mother; Heart disease in his father; Hyperlipidemia in his father; Hypertension in his brother and father; Other in his father; Stroke in his brother.     Assessment/Plan:   Obstructive sleep apnea. - intolerant of CPAP - had Inspire device implanted in May 2023 - amplitude 0.9 V with patient control range 0.7 to 1.1 V - device interrogated today and has normal function - concerned his recent issue is related to electric set up in his apartment; he will discuss with his landlord  Time Spent Involved in Patient Care on Day of Examination:  27 minutes  Follow up:   Patient Instructions  Follow up in 1 month  Medication List:   Allergies as of 01/01/2022  Reactions   Delsym [dextromethorphan] Other (See Comments)   Gets loopy/memory loss   Aspirin Nausea And Vomiting   GI upset   Mucinex D [pseudoephedrine-guaifenesin Er] Hypertension   Ibuprofen Other (See Comments)   Stomach irritation        Medication List        Accurate as of January 01, 2022 11:56 AM. If you have any questions, ask your nurse or doctor.          Aimovig 140 MG/ML Soaj Generic drug: Erenumab-aooe Inject 140 mg into the skin every 30 (thirty) days.   aspirin EC 81 MG tablet Take 1 tablet (81 mg total) by mouth 2 (two) times daily after a meal. For 2 weeks then once a day for 2 weeks for dvt prevention.    CORICIDIN HBP PO Take 2 tablets by mouth 2 (two) times daily as needed (congestion).   Dialyvite Vitamin D 5000 125 MCG (5000 UT) capsule Generic drug: Cholecalciferol Take 5,000 Units by mouth daily.   diclofenac 1.3 % Ptch Commonly known as: FLECTOR Place 1 patch onto the skin daily as needed (pain).   diclofenac Sodium 1 % Gel Commonly known as: VOLTAREN Apply 1 application. topically 4 (four) times daily as needed (pain).   HYDROcodone-acetaminophen 5-325 MG tablet Commonly known as: NORCO/VICODIN Take 1-2 tablets by mouth every 6 (six) hours as needed for moderate pain or severe pain (post op pain).   Melatonin 5 MG Caps Take 5 mg by mouth at bedtime.   Multaq 400 MG tablet Generic drug: dronedarone Take 1 tablet (400 mg total) by mouth 2 (two) times daily with a meal.   nitroGLYCERIN 0.4 MG SL tablet Commonly known as: NITROSTAT Place 1 tablet (0.4 mg total) under the tongue every 5 (five) minutes as needed for chest pain.   pantoprazole 20 MG tablet Commonly known as: Protonix Take 1 tablet (20 mg total) by mouth daily before breakfast.   pravastatin 20 MG tablet Commonly known as: PRAVACHOL Take 1 tablet (20 mg total) by mouth daily.   rOPINIRole 0.5 MG tablet Commonly known as: REQUIP TAKE 1 TABLET AT BEDTIME   SUMAtriptan 6 MG/0.5ML Soaj INJECT 6 MG INTO THE SKIN DAILY AS NEEDED AT MIGRAINE ONSET. MAY REPEAT 1 TIME AFTER 1 HOUR IF NEEDED   tiZANidine 4 MG tablet Commonly known as: Zanaflex Take 1 tablet (4 mg total) by mouth every 6 (six) hours as needed for muscle spasms.   topiramate 50 MG tablet Commonly known as: TOPAMAX Take one tablet in the AM and two tablets in the PM. What changed: additional instructions   triamcinolone cream 0.1 % Commonly known as: KENALOG Apply 1 Application topically 2 (two) times daily as needed (rash).   VITAMIN C PO Take 1 tablet by mouth daily.        Signature:  [name], MD [place] Pager - [phone] 01/01/2022, 11:56 AM

## 2022-01-01 NOTE — Progress Notes (Unsigned)
Cardiology Office Note   Date:  01/03/2022   ID:  Steven Vincent, DOB 1965-10-03, MRN [id number]  PCP:  [place], Betty G, MD  Cardiologist:   Minus Breeding, MD    Chief Complaint  Patient presents with   Electric shock sensation     History of Present Illness: Steven Vincent is a 56 y.o. male who presents for follow up of PVCs and bradycardia.   POET (Plain Old Exercise Treadmill)  in January 2018 was normal. Holter in October 2018 showed frequent PVCs with burden 16%. Average HR 66. Slowest HR 40 while asleep. Beta blocker was reduced and eventually stopped. Echo was normal.   In 2019 Lexiscan  Myoview showed a small fixed apical defect and was otherwise normal. He had repeat Holter in December 2019 showing again frequent PVCs with a burden of 26%. 4 beat run NSVT. Minimum HR 58.  He was seen in the ED on March 9 with concern about slow HR, chest pain, and fatigue. Noted to have pulse of 40 by pulse ox reading. ECG showed frequent PVCs with bigeminy and couplets.   He was eventually treated with Multaq.  He had much improvement in symptoms.    Late last year he  had chest pain.   He had a low risk perfusion study.   At the last visit he was still short of breath and having some chest discomfort..  However, coronary CT was negative.  He had normal coronary arteries with no calcium echo in 2021 demonstrated a well-preserved ejection fraction with no significant valvular abnormalities.  He called the added because he had an electric shock in his chest.  He had the Inspire device implanted for sleep apnea.  In the day he was reaching over to turn off the light he had a sharp shooting discomfort across his chest.  He thought it was an electric shock from his device.  This is never happened to him before.  He did go back and see his sleep doctor and there was no abnormality noted.  It was not thought to be coming from the device.  He has otherwise done well.  He had knee surgery and is  doing home PT.  He is advanced well with this.  He is not having any new shortness of breath, PND or orthopnea.  He had no new palpitations, presyncope or syncope.  He said no weight gain or edema.  He is actually lost about 20 pounds because of increased activity and watching his diet.   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   Dysrhythmia    bradycardia   Headache(784.0)    HLD (hyperlipidemia)    Knee pain    Low back pain    Migraines    OSA (obstructive sleep apnea) 04/10/2017   Personality disorder ([place])    Seizures ([place])     Past Surgical History:  Procedure Laterality Date   COLONOSCOPY     normal   DRUG INDUCED ENDOSCOPY N/A 03/07/2021   Procedure: DRUG INDUCED SLEEP ENDOSCOPY;  Surgeon: Melida Quitter, MD;  Location: [facility];  Service: ENT;  Laterality: N/A;   IMPLANTATION OF HYPOGLOSSAL NERVE STIMULATOR Right 05/16/2021   Procedure: IMPLANTATION OF HYPOGLOSSAL NERVE STIMULATOR;  Surgeon: Melida Quitter, MD;  Location: [facility];  Service: ENT;  Laterality: Right;   PATELLA-FEMORAL ARTHROPLASTY Right 12/25/2021  Procedure: RIGHT PATELLOFEMORAL REPLACEMENT;  Surgeon: Melrose Nakayama, MD;  Location: [facility];  Service: Orthopedics;  Laterality: Right;   TOOTH EXTRACTION  12/27/2014   lower left   TOTAL KNEE ARTHROPLASTY Left 11/28/2020   Procedure: LEFT KNEE PATELLAFEMORL REPLACEMENT;  Surgeon: Melrose Nakayama, MD;  Location: [facility];  Service: Orthopedics;  Laterality: Left;     Current Outpatient Medications  Medication Sig Dispense Refill   Ascorbic Acid (VITAMIN C PO) Take 1 tablet by mouth daily.     aspirin EC 81 MG tablet Take 1 tablet (81 mg total) by mouth 2 (two) times daily after a meal. For 2 weeks then once a day for 2 weeks for dvt prevention. 45 tablet 0   Cholecalciferol (DIALYVITE VITAMIN D 5000) 125 MCG (5000 UT) capsule Take 5,000 Units by mouth daily.     diclofenac  (FLECTOR) 1.3 % PTCH Place 1 patch onto the skin daily as needed (pain).     diclofenac Sodium (VOLTAREN) 1 % GEL Apply 1 application. topically 4 (four) times daily as needed (pain).     DM-APAP-CPM (CORICIDIN HBP PO) Take 2 tablets by mouth 2 (two) times daily as needed (congestion).     dronedarone (MULTAQ) 400 MG tablet Take 1 tablet (400 mg total) by mouth 2 (two) times daily with a meal. 180 tablet 3   Erenumab-aooe (AIMOVIG) 140 MG/ML SOAJ Inject 140 mg into the skin every 30 (thirty) days. 3 mL 3   HYDROcodone-acetaminophen (NORCO/VICODIN) 5-325 MG tablet Take 1-2 tablets by mouth every 6 (six) hours as needed for moderate pain or severe pain (post op pain). 40 tablet 0   Melatonin 5 MG CAPS Take 5 mg by mouth at bedtime.     pantoprazole (PROTONIX) 20 MG tablet Take 1 tablet (20 mg total) by mouth daily before breakfast. 90 tablet 1   pravastatin (PRAVACHOL) 20 MG tablet Take 1 tablet (20 mg total) by mouth daily. 90 tablet 3   rOPINIRole (REQUIP) 0.5 MG tablet TAKE 1 TABLET AT BEDTIME 90 tablet 1   SUMAtriptan 6 MG/0.5ML SOAJ INJECT 6 MG INTO THE SKIN DAILY AS NEEDED AT MIGRAINE ONSET. MAY REPEAT 1 TIME AFTER 1 HOUR IF NEEDED 6 mL 11   tiZANidine (ZANAFLEX) 4 MG tablet Take 1 tablet (4 mg total) by mouth every 6 (six) hours as needed for muscle spasms. 40 tablet 1   topiramate (TOPAMAX) 50 MG tablet Take one tablet in the AM and two tablets in the PM. (Patient taking differently: Take 100 mg in the morning and 50 mg at night) 270 tablet 3   triamcinolone cream (KENALOG) 0.1 % Apply 1 Application topically 2 (two) times daily as needed (rash).     nitroGLYCERIN (NITROSTAT) 0.4 MG SL tablet Place 1 tablet (0.4 mg total) under the tongue every 5 (five) minutes as needed for chest pain. 25 tablet 3   No current facility-administered medications for this visit.    Allergies:   Delsym [dextromethorphan], Aspirin, Mucinex d [pseudoephedrine-guaifenesin er], and Ibuprofen    ROS:  Please see  the history of present illness.   Otherwise, review of systems are positive for none.   All other systems are reviewed and negative.    PHYSICAL EXAM: VS:  BP (!) 136/92 (BP Location: Left Arm, Patient Position: Sitting, Cuff Size: Large)   Pulse 67   Ht '6\' 2"'$  (1.88 m)   Wt 261 lb (118.4 kg)   SpO2 98%   BMI 33.51 kg/m  , BMI Body mass index  is 33.51 kg/m. GENERAL:  Well appearing NECK:  No jugular venous distention, waveform within normal limits, carotid upstroke brisk and symmetric, no bruits, no thyromegaly LUNGS:  Clear to auscultation bilaterally CHEST:  Unremarkable HEART:  PMI not displaced or sustained,S1 and S2 within normal limits, no S3, no S4, no clicks, no rubs, no murmurs ABD:  Flat, positive bowel sounds normal in frequency in pitch, no bruits, no rebound, no guarding, no midline pulsatile mass, no hepatomegaly, no splenomegaly EXT:  2 plus pulses throughout, no edema, no cyanosis no clubbing   EKG:  EKG is   ordered today. The ekg ordered today demonstrates normal sinus rhythm, rate 68, axis within normal limits, intervals with mildly increased QT, no acute ST-T wave changes.   Recent Labs: 01/29/2021: BNP 39.2 07/24/2021: Magnesium 2.2 12/13/2021: BUN 28; Creatinine, Ser 1.20; Hemoglobin 14.6; Platelets 172; Potassium 3.8; Sodium 138    Lipid Panel    Component Value Date/Time   CHOL 146 08/04/2020 0953   TRIG 119.0 08/04/2020 0953   TRIG 231 (HH) 12/27/2005 1005   HDL 38.10 (L) 08/04/2020 0953   CHOLHDL 4 08/04/2020 0953   VLDL 23.8 08/04/2020 0953   LDLCALC 84 08/04/2020 0953   LDLDIRECT 70.0 07/07/2018 0833      Wt Readings from Last 3 Encounters:  01/03/22 261 lb (118.4 kg)  01/01/22 267 lb (121.1 kg)  12/25/21 265 lb (120.2 kg)      Other studies Reviewed: Additional studies/ records that were reviewed today include: Pulmonary records Review of the above records demonstrates: See elsewhere   ASSESSMENT AND PLAN:  Frequent PVCs:     He is  tolerating Multaq.  He is up-to-date with blood work.  He has not feels PVCs.  No change in therapy.   OSA:   He is delighted with how his Inspire device is working.   CHEST PAIN:   He has had no further chest pain and normal coronaries.  No further cardiac work-up.  Electric shock: His EKG is unremarkable.  I do not think this was an arrhythmia.  His device apparently was working well when interrogated.  At this point without further symptoms I do not think further work-up would be fruitful.  He will let us know if this happens again.      Current medicines are reviewed at length with the patient today.  The patient does not have concerns regarding medicines.  The following changes have been made:  None  Labs/ tests ordered today include: None   No orders of the defined types were placed in this encounter.   Disposition:   FU with with me in 12 months   Signed, Minus Breeding, MD  01/03/2022 9:10 AM    [place]

## 2022-01-01 NOTE — Patient Instructions (Signed)
Follow up in 1 month   

## 2022-01-02 DIAGNOSIS — R0602 Shortness of breath: Secondary | ICD-10-CM | POA: Insufficient documentation

## 2022-01-02 DIAGNOSIS — E785 Hyperlipidemia, unspecified: Secondary | ICD-10-CM | POA: Diagnosis not present

## 2022-01-02 DIAGNOSIS — G4733 Obstructive sleep apnea (adult) (pediatric): Secondary | ICD-10-CM | POA: Diagnosis not present

## 2022-01-02 DIAGNOSIS — Z96651 Presence of right artificial knee joint: Secondary | ICD-10-CM | POA: Diagnosis not present

## 2022-01-02 DIAGNOSIS — Z85828 Personal history of other malignant neoplasm of skin: Secondary | ICD-10-CM | POA: Diagnosis not present

## 2022-01-02 DIAGNOSIS — Z87891 Personal history of nicotine dependence: Secondary | ICD-10-CM | POA: Diagnosis not present

## 2022-01-02 DIAGNOSIS — Z7982 Long term (current) use of aspirin: Secondary | ICD-10-CM | POA: Diagnosis not present

## 2022-01-02 DIAGNOSIS — Z9181 History of falling: Secondary | ICD-10-CM | POA: Diagnosis not present

## 2022-01-02 DIAGNOSIS — Z471 Aftercare following joint replacement surgery: Secondary | ICD-10-CM | POA: Diagnosis not present

## 2022-01-02 DIAGNOSIS — Z96652 Presence of left artificial knee joint: Secondary | ICD-10-CM | POA: Diagnosis not present

## 2022-01-02 DIAGNOSIS — G43909 Migraine, unspecified, not intractable, without status migrainosus: Secondary | ICD-10-CM | POA: Diagnosis not present

## 2022-01-02 DIAGNOSIS — M7120 Synovial cyst of popliteal space [Baker], unspecified knee: Secondary | ICD-10-CM | POA: Diagnosis not present

## 2022-01-02 DIAGNOSIS — Z792 Long term (current) use of antibiotics: Secondary | ICD-10-CM | POA: Diagnosis not present

## 2022-01-03 ENCOUNTER — Ambulatory Visit: Payer: Medicare Other | Attending: Cardiology | Admitting: Cardiology

## 2022-01-03 ENCOUNTER — Encounter: Payer: Self-pay | Admitting: Cardiology

## 2022-01-03 VITALS — BP 136/92 | HR 67 | Ht 74.0 in | Wt 261.0 lb

## 2022-01-03 DIAGNOSIS — R0602 Shortness of breath: Secondary | ICD-10-CM

## 2022-01-03 DIAGNOSIS — R079 Chest pain, unspecified: Secondary | ICD-10-CM | POA: Diagnosis not present

## 2022-01-03 DIAGNOSIS — I493 Ventricular premature depolarization: Secondary | ICD-10-CM | POA: Diagnosis not present

## 2022-01-03 NOTE — Patient Instructions (Signed)
Medication Instructions:  Continue same medications *If you need a refill on your cardiac medications before your next appointment, please call your pharmacy*   Lab Work: None ordered   Testing/Procedures: None ordered   Follow-Up: At [facility], you and your health needs are our priority.  As part of our continuing mission to provide you with exceptional heart care, we have created designated Provider Care Teams.  These Care Teams include your primary Cardiologist (physician) and Advanced Practice Providers (APPs -  Physician Assistants and Nurse Practitioners) who all work together to provide you with the care you need, when you need it.  We recommend signing up for the patient portal called "MyChart".  Sign up information is provided on this After Visit Summary.  MyChart is used to connect with patients for Virtual Visits (Telemedicine).  Patients are able to view lab/test results, encounter notes, upcoming appointments, etc.  Non-urgent messages can be sent to your provider as well.   To learn more about what you can do with MyChart, go to [email or web address].    Your next appointment:  1 year   Call in July to schedule Nov appointment     The format for your next appointment: Office    Provider:  Dr.Hochrein   Important Information About Sugar

## 2022-01-04 DIAGNOSIS — E785 Hyperlipidemia, unspecified: Secondary | ICD-10-CM | POA: Diagnosis not present

## 2022-01-04 DIAGNOSIS — Z96651 Presence of right artificial knee joint: Secondary | ICD-10-CM | POA: Diagnosis not present

## 2022-01-04 DIAGNOSIS — Z471 Aftercare following joint replacement surgery: Secondary | ICD-10-CM | POA: Diagnosis not present

## 2022-01-04 DIAGNOSIS — Z96652 Presence of left artificial knee joint: Secondary | ICD-10-CM | POA: Diagnosis not present

## 2022-01-04 DIAGNOSIS — Z87891 Personal history of nicotine dependence: Secondary | ICD-10-CM | POA: Diagnosis not present

## 2022-01-04 DIAGNOSIS — G4733 Obstructive sleep apnea (adult) (pediatric): Secondary | ICD-10-CM | POA: Diagnosis not present

## 2022-01-04 DIAGNOSIS — Z792 Long term (current) use of antibiotics: Secondary | ICD-10-CM | POA: Diagnosis not present

## 2022-01-04 DIAGNOSIS — G43909 Migraine, unspecified, not intractable, without status migrainosus: Secondary | ICD-10-CM | POA: Diagnosis not present

## 2022-01-04 DIAGNOSIS — Z85828 Personal history of other malignant neoplasm of skin: Secondary | ICD-10-CM | POA: Diagnosis not present

## 2022-01-04 DIAGNOSIS — Z9181 History of falling: Secondary | ICD-10-CM | POA: Diagnosis not present

## 2022-01-04 DIAGNOSIS — Z7982 Long term (current) use of aspirin: Secondary | ICD-10-CM | POA: Diagnosis not present

## 2022-01-04 DIAGNOSIS — M7120 Synovial cyst of popliteal space [Baker], unspecified knee: Secondary | ICD-10-CM | POA: Diagnosis not present

## 2022-01-07 DIAGNOSIS — G4733 Obstructive sleep apnea (adult) (pediatric): Secondary | ICD-10-CM | POA: Diagnosis not present

## 2022-01-07 DIAGNOSIS — E785 Hyperlipidemia, unspecified: Secondary | ICD-10-CM | POA: Diagnosis not present

## 2022-01-07 DIAGNOSIS — Z87891 Personal history of nicotine dependence: Secondary | ICD-10-CM | POA: Diagnosis not present

## 2022-01-07 DIAGNOSIS — Z9181 History of falling: Secondary | ICD-10-CM | POA: Diagnosis not present

## 2022-01-07 DIAGNOSIS — Z96652 Presence of left artificial knee joint: Secondary | ICD-10-CM | POA: Diagnosis not present

## 2022-01-07 DIAGNOSIS — Z85828 Personal history of other malignant neoplasm of skin: Secondary | ICD-10-CM | POA: Diagnosis not present

## 2022-01-07 DIAGNOSIS — G43909 Migraine, unspecified, not intractable, without status migrainosus: Secondary | ICD-10-CM | POA: Diagnosis not present

## 2022-01-07 DIAGNOSIS — Z7982 Long term (current) use of aspirin: Secondary | ICD-10-CM | POA: Diagnosis not present

## 2022-01-07 DIAGNOSIS — M7120 Synovial cyst of popliteal space [Baker], unspecified knee: Secondary | ICD-10-CM | POA: Diagnosis not present

## 2022-01-07 DIAGNOSIS — Z792 Long term (current) use of antibiotics: Secondary | ICD-10-CM | POA: Diagnosis not present

## 2022-01-07 DIAGNOSIS — Z471 Aftercare following joint replacement surgery: Secondary | ICD-10-CM | POA: Diagnosis not present

## 2022-01-07 DIAGNOSIS — Z96651 Presence of right artificial knee joint: Secondary | ICD-10-CM | POA: Diagnosis not present

## 2022-01-09 DIAGNOSIS — Z87891 Personal history of nicotine dependence: Secondary | ICD-10-CM | POA: Diagnosis not present

## 2022-01-09 DIAGNOSIS — M7120 Synovial cyst of popliteal space [Baker], unspecified knee: Secondary | ICD-10-CM | POA: Diagnosis not present

## 2022-01-09 DIAGNOSIS — Z85828 Personal history of other malignant neoplasm of skin: Secondary | ICD-10-CM | POA: Diagnosis not present

## 2022-01-09 DIAGNOSIS — Z9181 History of falling: Secondary | ICD-10-CM | POA: Diagnosis not present

## 2022-01-09 DIAGNOSIS — Z471 Aftercare following joint replacement surgery: Secondary | ICD-10-CM | POA: Diagnosis not present

## 2022-01-09 DIAGNOSIS — Z96651 Presence of right artificial knee joint: Secondary | ICD-10-CM | POA: Diagnosis not present

## 2022-01-09 DIAGNOSIS — E785 Hyperlipidemia, unspecified: Secondary | ICD-10-CM | POA: Diagnosis not present

## 2022-01-09 DIAGNOSIS — G4733 Obstructive sleep apnea (adult) (pediatric): Secondary | ICD-10-CM | POA: Diagnosis not present

## 2022-01-09 DIAGNOSIS — Z7982 Long term (current) use of aspirin: Secondary | ICD-10-CM | POA: Diagnosis not present

## 2022-01-09 DIAGNOSIS — Z792 Long term (current) use of antibiotics: Secondary | ICD-10-CM | POA: Diagnosis not present

## 2022-01-09 DIAGNOSIS — Z96652 Presence of left artificial knee joint: Secondary | ICD-10-CM | POA: Diagnosis not present

## 2022-01-09 DIAGNOSIS — G43909 Migraine, unspecified, not intractable, without status migrainosus: Secondary | ICD-10-CM | POA: Diagnosis not present

## 2022-01-11 DIAGNOSIS — Z9889 Other specified postprocedural states: Secondary | ICD-10-CM | POA: Diagnosis not present

## 2022-01-24 ENCOUNTER — Ambulatory Visit: Payer: Medicare HMO | Admitting: Cardiology

## 2022-02-04 NOTE — Progress Notes (Unsigned)
HPI: [name] is a 56 y.o.male with past medical history significant for hyperlipidemia, hearing loss, migraine headaches, OSA, OSA, RLS,GERD,schizotypal personality disorder, and PVCs here today for his routine physical examination.  Last CPE: 08/04/2020 No new problems since his last visit. Underwent right TKR and recovered well, completed PT. He returned to work two weeks ago and has been active at work.   He states that he consumes vegetables daily, fish, and avoids red meat and chicken.  He sleeps between 8 to 8.5 hours on average, does not smoke, and consumes minimal alcohol. He uses hearing aids and visits the audiologist for regular cleanings.  He regularly visits the dentist.  Immunization History  Administered Date(s) Administered   Influenza Split 12/03/2010   Influenza Whole 12/04/2006, 11/17/2007, 01/11/2009   Influenza,inj,Quad PF,6+ Mos 10/25/2018   Td 12/04/2006   Health Maintenance  Topic Date Due   Medicare Annual Wellness (AWV)  04/06/2022   COVID-19 Vaccine (1) 02/21/2022 (Originally 08/07/1970)   Zoster Vaccines- Shingrix (1 of 2) 03/01/2022 (Originally 08/06/1984)   INFLUENZA VACCINE  05/26/2022 (Originally 09/25/2021)   HIV Screening  03/17/2023 (Originally 08/06/1980)   COLONOSCOPY (Pts 45-2yr Insurance coverage will need to be confirmed)  09/20/2025   Hepatitis C Screening  Completed   HPV VACCINES  Aged Out   DTaP/Tdap/Td  Discontinued   Last prostate ca screening: Nocturia x 2, stable. Lab Results  Component Value Date   PSA 0.19 08/04/2020   PSA 0.23 03/17/2019   His glucose was mildly elevated in October/2023, 114.  Review of Systems  Constitutional:  Negative for activity change, appetite change and fever.  HENT:  Negative for mouth sores, nosebleeds, sore throat and trouble swallowing.   Eyes:  Negative for redness and visual disturbance.  Respiratory:  Negative for cough, shortness of breath and wheezing.   Cardiovascular:   Negative for chest pain, palpitations and leg swelling.  Gastrointestinal:  Negative for abdominal pain, blood in stool, nausea and vomiting.  Endocrine: Negative for cold intolerance, heat intolerance, polydipsia, polyphagia and polyuria.  Genitourinary:  Negative for decreased urine volume, dysuria, genital sores, hematuria and testicular pain.  Musculoskeletal:  Positive for arthralgias and back pain. Negative for gait problem.  Skin:  Negative for color change and rash.  Allergic/Immunologic: Positive for environmental allergies.  Neurological:  Negative for syncope, weakness and headaches.  Hematological:  Negative for adenopathy. Does not bruise/bleed easily.  Psychiatric/Behavioral:  Negative for confusion and hallucinations.   All other systems reviewed and are negative.  Current Outpatient Medications on File Prior to Visit  Medication Sig Dispense Refill   Ascorbic Acid (VITAMIN C PO) Take 1 tablet by mouth daily.     aspirin EC 81 MG tablet Take 1 tablet (81 mg total) by mouth 2 (two) times daily after a meal. For 2 weeks then once a day for 2 weeks for dvt prevention. 45 tablet 0   Cholecalciferol (DIALYVITE VITAMIN D 5000) 125 MCG (5000 UT) capsule Take 5,000 Units by mouth daily.     diclofenac (FLECTOR) 1.3 % PTCH Place 1 patch onto the skin daily as needed (pain).     diclofenac Sodium (VOLTAREN) 1 % GEL Apply 1 application. topically 4 (four) times daily as needed (pain).     DM-APAP-CPM (CORICIDIN HBP PO) Take 2 tablets by mouth 2 (two) times daily as needed (congestion).     dronedarone (MULTAQ) 400 MG tablet Take 1 tablet (400 mg total) by mouth 2 (two) times daily with a meal.  180 tablet 3   Erenumab-aooe (AIMOVIG) 140 MG/ML SOAJ Inject 140 mg into the skin every 30 (thirty) days. 3 mL 3   HYDROcodone-acetaminophen (NORCO/VICODIN) 5-325 MG tablet Take 1-2 tablets by mouth every 6 (six) hours as needed for moderate pain or severe pain (post op pain). 40 tablet 0   Melatonin  5 MG CAPS Take 5 mg by mouth at bedtime.     pantoprazole (PROTONIX) 20 MG tablet Take 1 tablet (20 mg total) by mouth daily before breakfast. 90 tablet 1   pravastatin (PRAVACHOL) 20 MG tablet Take 1 tablet (20 mg total) by mouth daily. 90 tablet 3   rOPINIRole (REQUIP) 0.5 MG tablet TAKE 1 TABLET AT BEDTIME 90 tablet 1   SUMAtriptan 6 MG/0.5ML SOAJ INJECT 6 MG INTO THE SKIN DAILY AS NEEDED AT MIGRAINE ONSET. MAY REPEAT 1 TIME AFTER 1 HOUR IF NEEDED 6 mL 11   tiZANidine (ZANAFLEX) 4 MG tablet Take 1 tablet (4 mg total) by mouth every 6 (six) hours as needed for muscle spasms. 40 tablet 1   topiramate (TOPAMAX) 50 MG tablet Take one tablet in the AM and two tablets in the PM. (Patient taking differently: Take 100 mg in the morning and 50 mg at night) 270 tablet 3   triamcinolone cream (KENALOG) 0.1 % Apply 1 Application topically 2 (two) times daily as needed (rash).     nitroGLYCERIN (NITROSTAT) 0.4 MG SL tablet Place 1 tablet (0.4 mg total) under the tongue every 5 (five) minutes as needed for chest pain. 25 tablet 3   No current facility-administered medications on file prior to visit.   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   Dysrhythmia    bradycardia   Headache(784.0)    HLD (hyperlipidemia)    Knee pain    Low back pain    Migraines    OSA (obstructive sleep apnea) 04/10/2017   Personality disorder ([place])    Seizures ([place])     Past Surgical History:  Procedure Laterality Date   COLONOSCOPY     normal   DRUG INDUCED ENDOSCOPY N/A 03/07/2021   Procedure: DRUG INDUCED SLEEP ENDOSCOPY;  Surgeon: Melida Quitter, MD;  Location: [facility];  Service: ENT;  Laterality: N/A;   IMPLANTATION OF HYPOGLOSSAL NERVE STIMULATOR Right 05/16/2021   Procedure: IMPLANTATION OF HYPOGLOSSAL NERVE STIMULATOR;  Surgeon: Melida Quitter, MD;  Location: [facility];  Service: ENT;  Laterality: Right;   PATELLA-FEMORAL  ARTHROPLASTY Right 12/25/2021   Procedure: RIGHT PATELLOFEMORAL REPLACEMENT;  Surgeon: Melrose Nakayama, MD;  Location: [facility];  Service: Orthopedics;  Laterality: Right;   TOOTH EXTRACTION  12/27/2014   lower left   TOTAL KNEE ARTHROPLASTY Left 11/28/2020   Procedure: LEFT KNEE PATELLAFEMORL REPLACEMENT;  Surgeon: Melrose Nakayama, MD;  Location: [facility];  Service: Orthopedics;  Laterality: Left;    Allergies  Allergen Reactions   Delsym [Dextromethorphan] Other (See Comments)    Gets loopy/memory loss   Aspirin Nausea And Vomiting    GI upset    Mucinex D [Pseudoephedrine-Guaifenesin Er] Hypertension   Ibuprofen Other (See Comments)    Stomach irritation    Family History  Problem Relation Age of Onset   Heart disease Father        defibrilator   Hyperlipidemia Father    Hypertension Father    Alzheimer's disease Father    Other Father  stents in legs   Aneurysm Mother        brain   Hypertension Brother    Stroke Brother        X2    Social History   Socioeconomic History   Marital status: Divorced    Spouse name: Not on file   Number of children: 0   Years of education: Not on file   Highest education level: Not on file  Occupational History   Occupation: disabled  Tobacco Use   Smoking status: Former    Packs/day: 1.00    Years: 1.00    Total pack years: 1.00    Types: Cigarettes    Quit date: 2000    Years since quitting: 23.9   Smokeless tobacco: Never   Tobacco comments:    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 Wife of 16 years left him about 2 years ago, and pt. has had difficulty adjusting.   Says his brother Francee Piccolo, who lives in [place], is his main contact, source of support, as well as his dog.     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 States he uses painting as additional therapy.    Social Determinants of Health   Financial Resource Strain: Medium Risk (04/06/2021)   Overall Financial Resource Strain (CARDIA)    Difficulty of Paying Living Expenses: Somewhat hard  Food Insecurity: Food Insecurity Present (04/06/2021)   Hunger Vital Sign    Worried About Running Out of Food in the Last Year: Often true    Ran Out of Food in the Last Year: Sometimes true  Transportation Needs: No Transportation Needs (11/30/2021)   PRAPARE - Hydrologist (Medical): No    Lack of Transportation (Non-Medical): No  Physical Activity: Sufficiently Active (04/06/2021)   Exercise Vital Sign    Days of Exercise per Week: 3 days    Minutes of Exercise per Session: 120 min  Stress: No Stress Concern Present (04/06/2021)   [place]    Feeling of Stress : Not at all  Social Connections: Moderately Integrated (04/06/2021)   Social Connection and Isolation Panel [NHANES]    Frequency of Communication with Friends and Family: More than three times a week    Frequency of Social Gatherings with Friends and Family: More than three times a week    Attends Religious Services: More than 4 times per year    Active Member of Clubs or Organizations: Yes    Attends Archivist Meetings: More than 4 times per year    Marital Status: Divorced   Vitals:   02/05/22 0734  BP: 122/76  Pulse: 75  Resp: 16  Temp: 98.4 F (36.9 C)  SpO2: 98%  Body mass index is 34.02 kg/m.  Wt Readings from Last 3 Encounters:  02/05/22 265 lb (120.2 kg)  01/03/22 261 lb (118.4 kg)  01/01/22 267 lb (121.1 kg)   Physical Exam Vitals and nursing note reviewed.  Constitutional:      General: He is not in acute distress.     Appearance:  He is well-developed and well-groomed.  HENT:     Head: Normocephalic and atraumatic.     Right Ear: Tympanic membrane, ear canal and external ear normal.     Left Ear: Tympanic membrane, ear canal and external ear normal.  Eyes:     Extraocular Movements: Extraocular movements intact.     Conjunctiva/sclera: Conjunctivae normal.     Pupils: Pupils are equal, round, and reactive to light.  Neck:     Thyroid: No thyromegaly.     Trachea: No tracheal deviation.  Cardiovascular:     Rate and Rhythm: Normal rate and regular rhythm.     Pulses:          Dorsalis pedis pulses are 2+ on the right side and 2+ on the left side.     Heart sounds: No murmur heard.    Comments: Trace pitting LE edema, bilateral. Pulmonary:     Effort: Pulmonary effort is normal. No respiratory distress.     Breath sounds: Normal breath sounds.  Abdominal:     Palpations: Abdomen is soft. There is no hepatomegaly or mass.     Tenderness: There is no abdominal tenderness.  Genitourinary:    Comments: No concerns. Musculoskeletal:        General: No tenderness.     Cervical back: Normal range of motion.     Comments: No signs of synovitis.  Lymphadenopathy:     Cervical: No cervical adenopathy.     Upper Body:     Right upper body: No supraclavicular adenopathy.     Left upper body: No supraclavicular adenopathy.  Skin:    General: Skin is warm.     Findings: No erythema.  Neurological:     General: No focal deficit present.     Mental Status: He is alert and oriented to person, place, and time.     Cranial Nerves: No cranial nerve deficit.     Sensory: No sensory deficit.     Gait: Gait normal.     Deep Tendon Reflexes:     Reflex Scores:      Bicep reflexes are 2+ on the right side and 2+ on the left side.      Patellar reflexes are 2+ on the right side and 2+ on the left side. Psychiatric:        Mood and Affect: Affect normal. Mood is anxious.   ASSESSMENT AND PLAN:  Mr. Kristi  was seen today for annual exam.  Diagnoses and all orders for this visit:  Orders Placed This Encounter  Procedures   Hepatic function panel   Lipid panel   Hemoglobin A1c   PSA   Lab Results  Component Value Date   PSA 0.37 02/05/2022   PSA 0.19 08/04/2020   PSA 0.23 03/17/2019   Lab Results  Component Value Date   HGBA1C 5.2 02/05/2022   Lab Results  Component Value Date   CHOL 115 02/05/2022   HDL 26.50 (L) 02/05/2022   LDLCALC 58 02/05/2022   LDLDIRECT 70.0 07/07/2018   TRIG 150.0 (H) 02/05/2022   CHOLHDL 4 02/05/2022   Lab Results  Component Value Date   ALT 20 02/05/2022   AST 20 02/05/2022   ALKPHOS 68 02/05/2022   BILITOT 0.4 02/05/2022   Routine general medical examination at a health care facility Assessment & Plan: We discussed the importance of regular physical activity and healthy diet for prevention of chronic illness and/or complications. Preventive guidelines reviewed. Vaccination : Shingrix vaccine recommended, he  can get it at his pharmacy. Next CPE in a year.   Mixed hyperlipidemia Assessment & Plan: Continue pravastatin 20 mg daily. Further recommendations according to FLP result.  Orders: -     Hepatic function panel; Future -     Lipid panel; Future  Elevated glucose level -     Hemoglobin A1c; Future  Prostate cancer screening -     PSA; Future  Return in 1 year (on 02/06/2023).  Charmane Protzman G. Martinique, MD  [facility]. [place] office.

## 2022-02-05 ENCOUNTER — Encounter: Payer: Self-pay | Admitting: Family Medicine

## 2022-02-05 ENCOUNTER — Ambulatory Visit (INDEPENDENT_AMBULATORY_CARE_PROVIDER_SITE_OTHER): Payer: Medicare Other | Admitting: Family Medicine

## 2022-02-05 VITALS — BP 122/76 | HR 75 | Temp 98.4°F | Resp 16 | Ht 74.0 in | Wt 265.0 lb

## 2022-02-05 DIAGNOSIS — Z0000 Encounter for general adult medical examination without abnormal findings: Secondary | ICD-10-CM | POA: Diagnosis not present

## 2022-02-05 DIAGNOSIS — R7309 Other abnormal glucose: Secondary | ICD-10-CM

## 2022-02-05 DIAGNOSIS — E782 Mixed hyperlipidemia: Secondary | ICD-10-CM

## 2022-02-05 DIAGNOSIS — Z125 Encounter for screening for malignant neoplasm of prostate: Secondary | ICD-10-CM | POA: Diagnosis not present

## 2022-02-05 LAB — LIPID PANEL
Cholesterol: 115 mg/dL (ref 0–200)
HDL: 26.5 mg/dL — ABNORMAL LOW (ref >39.00)
LDL Cholesterol: 58 mg/dL (ref 0–99)
NonHDL: 88.02
Total CHOL/HDL Ratio: 4
Triglycerides: 150 mg/dL — ABNORMAL HIGH (ref 0.0–149.0)
VLDL: 30 mg/dL (ref 0.0–40.0)

## 2022-02-05 LAB — HEPATIC FUNCTION PANEL
ALT: 20 U/L (ref 0–53)
AST: 20 U/L (ref 0–37)
Albumin: 4.1 g/dL (ref 3.5–5.2)
Alkaline Phosphatase: 68 U/L (ref 39–117)
Bilirubin, Direct: 0.1 mg/dL (ref 0.0–0.3)
Total Bilirubin: 0.4 mg/dL (ref 0.2–1.2)
Total Protein: 6.8 g/dL (ref 6.0–8.3)

## 2022-02-05 LAB — HEMOGLOBIN A1C: Hgb A1c MFr Bld: 5.2 % (ref 4.6–6.5)

## 2022-02-05 LAB — PSA: PSA: 0.37 ng/mL (ref 0.10–4.00)

## 2022-02-05 NOTE — Patient Instructions (Addendum)
A few things to remember from today's visit:  Routine general medical examination at a health care facility  Mixed hyperlipidemia  Elevated glucose level Get your shingrix at your pharmacy.  Do not use My Chart to request refills or for acute issues that need immediate attention. If you send a my chart message, it may take a few days to be addressed, specially if I am not in the office.  Please be sure medication list is accurate. If a new problem present, please set up appointment sooner than planned today.  Health Maintenance, Male Adopting a healthy lifestyle and getting preventive care are important in promoting health and wellness. Ask your health care provider about: The right schedule for you to have regular tests and exams. Things you can do on your own to prevent diseases and keep yourself healthy. What should I know about diet, weight, and exercise? Eat a healthy diet  Eat a diet that includes plenty of vegetables, fruits, low-fat dairy products, and lean protein. Do not eat a lot of foods that are high in solid fats, added sugars, or sodium. Maintain a healthy weight Body mass index (BMI) is a measurement that can be used to identify possible weight problems. It estimates body fat based on height and weight. Your health care provider can help determine your BMI and help you achieve or maintain a healthy weight. Get regular exercise Get regular exercise. This is one of the most important things you can do for your health. Most adults should: Exercise for at least 150 minutes each week. The exercise should increase your heart rate and make you sweat (moderate-intensity exercise). Do strengthening exercises at least twice a week. This is in addition to the moderate-intensity exercise. Spend less time sitting. Even light physical activity can be beneficial. Watch cholesterol and blood lipids Have your blood tested for lipids and cholesterol at 56 years of age, then have this  test every 5 years. You may need to have your cholesterol levels checked more often if: Your lipid or cholesterol levels are high. You are older than 56 years of age. You are at high risk for heart disease. What should I know about cancer screening? Many types of cancers can be detected early and may often be prevented. Depending on your health history and family history, you may need to have cancer screening at various ages. This may include screening for: Colorectal cancer. Prostate cancer. Skin cancer. Lung cancer. What should I know about heart disease, diabetes, and high blood pressure? Blood pressure and heart disease High blood pressure causes heart disease and increases the risk of stroke. This is more likely to develop in people who have high blood pressure readings or are overweight. Talk with your health care provider about your target blood pressure readings. Have your blood pressure checked: Every 3-5 years if you are 40-16 years of age. Every year if you are 41 years old or older. If you are between the ages of 30 and 51 and are a current or former smoker, ask your health care provider if you should have a one-time screening for abdominal aortic aneurysm (AAA). Diabetes Have regular diabetes screenings. This checks your fasting blood sugar level. Have the screening done: Once every three years after age 35 if you are at a normal weight and have a low risk for diabetes. More often and at a younger age if you are overweight or have a high risk for diabetes. What should I know about preventing infection? Hepatitis B  If you have a higher risk for hepatitis B, you should be screened for this virus. Talk with your health care provider to find out if you are at risk for hepatitis B infection. Hepatitis C Blood testing is recommended for: Everyone born from [birth year] through 1965. Anyone with known risk factors for hepatitis C. Sexually transmitted infections (STIs) You should be  screened each year for STIs, including gonorrhea and chlamydia, if: You are sexually active and are younger than 56 years of age. You are older than 56 years of age and your health care provider tells you that you are at risk for this type of infection. Your sexual activity has changed since you were last screened, and you are at increased risk for chlamydia or gonorrhea. Ask your health care provider if you are at risk. Ask your health care provider about whether you are at high risk for HIV. Your health care provider may recommend a prescription medicine to help prevent HIV infection. If you choose to take medicine to prevent HIV, you should first get tested for HIV. You should then be tested every 3 months for as long as you are taking the medicine. Follow these instructions at home: Alcohol use Do not drink alcohol if your health care provider tells you not to drink. If you drink alcohol: Limit how much you have to 0-2 drinks a day. Know how much alcohol is in your drink. In the [place] one drink equals one 12 oz bottle of beer (355 mL), one 5 oz glass of wine (148 mL), or one 1 oz glass of hard liquor (44 mL). Lifestyle Do not use any products that contain nicotine or tobacco. These products include cigarettes, chewing tobacco, and vaping devices, such as e-cigarettes. If you need help quitting, ask your health care provider. Do not use street drugs. Do not share needles. Ask your health care provider for help if you need support or information about quitting drugs. General instructions Schedule regular health, dental, and eye exams. Stay current with your vaccines. Tell your health care provider if: You often feel depressed. You have ever been abused or do not feel safe at home. Summary Adopting a healthy lifestyle and getting preventive care are important in promoting health and wellness. Follow your health care provider's instructions about healthy diet, exercising, and getting tested  or screened for diseases. Follow your health care provider's instructions on monitoring your cholesterol and blood pressure. This information is not intended to replace advice given to you by your health care provider. Make sure you discuss any questions you have with your health care provider. Document Revised: 07/03/2020 Document Reviewed: 07/03/2020 Elsevier Patient Education  [place].

## 2022-02-05 NOTE — Assessment & Plan Note (Signed)
We discussed the importance of regular physical activity and healthy diet for prevention of chronic illness and/or complications. Preventive guidelines reviewed. Vaccination : Shingrix vaccine recommended, he can get it at his pharmacy. Next CPE in a year.

## 2022-02-05 NOTE — Assessment & Plan Note (Signed)
Continue pravastatin 20 mg daily. Further recommendations according to FLP result.

## 2022-02-21 ENCOUNTER — Telehealth: Payer: Self-pay | Admitting: Pulmonary Disease

## 2022-02-21 ENCOUNTER — Ambulatory Visit (HOSPITAL_BASED_OUTPATIENT_CLINIC_OR_DEPARTMENT_OTHER): Payer: Medicare Other | Admitting: Pulmonary Disease

## 2022-02-21 NOTE — Telephone Encounter (Signed)
Noted  
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
## 2022-03-15 ENCOUNTER — Ambulatory Visit (INDEPENDENT_AMBULATORY_CARE_PROVIDER_SITE_OTHER): Payer: 59 | Admitting: Family Medicine

## 2022-03-15 ENCOUNTER — Telehealth: Payer: Self-pay | Admitting: Family Medicine

## 2022-03-15 ENCOUNTER — Encounter: Payer: Self-pay | Admitting: Family Medicine

## 2022-03-15 VITALS — BP 138/84 | HR 66 | Temp 97.8°F | Wt 261.0 lb

## 2022-03-15 DIAGNOSIS — K219 Gastro-esophageal reflux disease without esophagitis: Secondary | ICD-10-CM

## 2022-03-15 DIAGNOSIS — B353 Tinea pedis: Secondary | ICD-10-CM

## 2022-03-15 MED ORDER — PANTOPRAZOLE SODIUM 20 MG PO TBEC: 20.0000 mg | DELAYED_RELEASE_TABLET | Freq: Every day | ORAL | 1 refills | Status: DC

## 2022-03-15 MED ORDER — KETOCONAZOLE 2 % EX CREA: 1.0000 | TOPICAL_CREAM | Freq: Every day | CUTANEOUS | 0 refills | Status: AC

## 2022-03-15 NOTE — Telephone Encounter (Signed)
Patient states that all the medications that are prescribed by Dr. Martinique need to be refilled by mail order that is in his chart.

## 2022-03-15 NOTE — Progress Notes (Signed)
   Subjective:    Patient ID: Steven Vincent, male    DOB: [birth year], 57 y.o.   MRN: [id number]  HPI Here for 2 weeks of a sharp pain at the base of the left 4th toe. No recent trauma.    Review of Systems  Constitutional: Negative.   Respiratory: Negative.    Cardiovascular: Negative.        Objective:   Physical Exam Constitutional:      Appearance: Normal appearance.  Cardiovascular:     Rate and Rhythm: Normal rate and regular rhythm.     Pulses: Normal pulses.     Heart sounds: Normal heart sounds.  Pulmonary:     Breath sounds: Normal breath sounds.  Skin:    Comments: There is a patch of fungal infection between the left 3rd and 4th toes with a small fissure open. No signs of bacterial infection   Neurological:     Mental Status: He is alert.           Assessment & Plan:  Tinea pedis, treat with Ketoconazole cream. Alysia Penna, MD

## 2022-03-22 ENCOUNTER — Telehealth: Payer: Self-pay | Admitting: Family Medicine

## 2022-03-22 DIAGNOSIS — M25561 Pain in right knee: Secondary | ICD-10-CM | POA: Diagnosis not present

## 2022-03-22 DIAGNOSIS — M25551 Pain in right hip: Secondary | ICD-10-CM | POA: Diagnosis not present

## 2022-03-22 DIAGNOSIS — M25552 Pain in left hip: Secondary | ICD-10-CM | POA: Diagnosis not present

## 2022-03-22 NOTE — Telephone Encounter (Signed)
Pt called upset & yelling, asking why MD has not refilled his prescriptions? Pt was asked the name of said prescriptions. Pt said he could not remember the names but,  another provider prescribed them, and that provider has since retired. Pt was asked if he had a discussion with MD about her continuing to prescribe those medications? Pt said no, he never has. Pt was informed he would need an OV. Pt became even more upset and said he better not be charged because he has Medicare. Pt will be here on Wed 03/27/22

## 2022-03-25 ENCOUNTER — Telehealth: Payer: Self-pay | Admitting: Neurology

## 2022-03-25 MED ORDER — ROPINIROLE HCL 0.5 MG PO TABS: 0.5000 mg | ORAL_TABLET | Freq: Every day | ORAL | 1 refills | Status: DC

## 2022-03-25 MED ORDER — TOPIRAMATE 50 MG PO TABS: ORAL_TABLET | ORAL | 3 refills | Status: DC

## 2022-03-25 NOTE — Telephone Encounter (Signed)
Pt is calling requesting refills on topiramate (TOPAMAX) 50 MG tablet  and rOPINIRole (REQUIP) 0.5 MG tablet. Refill   Phone:   Fax:     [facility]

## 2022-03-25 NOTE — Telephone Encounter (Signed)
Rx filled as per last office visit note.

## 2022-03-26 NOTE — Progress Notes (Deleted)
HPI:  [name] is a 57 y.o. male, who is here today to follow on medications previously filled by another provider.  Review of Systems See other pertinent positives and negatives in HPI.  Current Outpatient Medications on File Prior to Visit  Medication Sig Dispense Refill   Ascorbic Acid (VITAMIN C PO) Take 1 tablet by mouth daily.     aspirin EC 81 MG tablet Take 1 tablet (81 mg total) by mouth 2 (two) times daily after a meal. For 2 weeks then once a day for 2 weeks for dvt prevention. 45 tablet 0   Cholecalciferol (DIALYVITE VITAMIN D 5000) 125 MCG (5000 UT) capsule Take 5,000 Units by mouth daily.     diclofenac (FLECTOR) 1.3 % PTCH Place 1 patch onto the skin daily as needed (pain).     diclofenac Sodium (VOLTAREN) 1 % GEL Apply 1 application. topically 4 (four) times daily as needed (pain).     DM-APAP-CPM (CORICIDIN HBP PO) Take 2 tablets by mouth 2 (two) times daily as needed (congestion).     dronedarone (MULTAQ) 400 MG tablet Take 1 tablet (400 mg total) by mouth 2 (two) times daily with a meal. 180 tablet 3   Erenumab-aooe (AIMOVIG) 140 MG/ML SOAJ Inject 140 mg into the skin every 30 (thirty) days. 3 mL 3   HYDROcodone-acetaminophen (NORCO/VICODIN) 5-325 MG tablet Take 1-2 tablets by mouth every 6 (six) hours as needed for moderate pain or severe pain (post op pain). 40 tablet 0   ketoconazole (NIZORAL) 2 % cream Apply 1 Application topically daily. 30 g 0   Melatonin 5 MG CAPS Take 5 mg by mouth at bedtime.     nitroGLYCERIN (NITROSTAT) 0.4 MG SL tablet Place 1 tablet (0.4 mg total) under the tongue every 5 (five) minutes as needed for chest pain. 25 tablet 3   pantoprazole (PROTONIX) 20 MG tablet Take 1 tablet (20 mg total) by mouth daily before breakfast. 90 tablet 1   pravastatin (PRAVACHOL) 20 MG tablet Take 1 tablet (20 mg total) by mouth daily. 90 tablet 3   rOPINIRole (REQUIP) 0.5 MG tablet Take 1 tablet (0.5 mg total) by mouth at bedtime. 90 tablet 1    SUMAtriptan 6 MG/0.5ML SOAJ INJECT 6 MG INTO THE SKIN DAILY AS NEEDED AT MIGRAINE ONSET. MAY REPEAT 1 TIME AFTER 1 HOUR IF NEEDED 6 mL 11   tiZANidine (ZANAFLEX) 4 MG tablet Take 1 tablet (4 mg total) by mouth every 6 (six) hours as needed for muscle spasms. 40 tablet 1   topiramate (TOPAMAX) 50 MG tablet Take one tablet in the AM and two tablets in the PM. 270 tablet 3   triamcinolone cream (KENALOG) 0.1 % Apply 1 Application topically 2 (two) times daily as needed (rash).     No current facility-administered medications on file prior to visit.    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   Dysrhythmia    bradycardia   Headache(784.0)    HLD (hyperlipidemia)    Knee pain    Low back pain    Migraines    OSA (obstructive sleep apnea) 04/10/2017   Personality disorder (HCC)    Seizures (HCC)    Allergies  Allergen Reactions   Delsym [Dextromethorphan] Other (See Comments)    Gets loopy/memory loss   Aspirin Nausea And Vomiting    GI upset    Mucinex  D [Pseudoephedrine-Guaifenesin Er] Hypertension   Ibuprofen Other (See Comments)    Stomach irritation    Social History   Socioeconomic History   Marital status: Divorced    Spouse name: Not on file   Number of children: 0   Years of education: Not on file   Highest education level: Not on file  Occupational History   Occupation: disabled  Tobacco Use   Smoking status: Former    Packs/day: 1.00    Years: 1.00    Total pack years: 1.00    Types: Cigarettes    Quit date: 2000    Years since quitting: 24.0   Smokeless tobacco: Never   Tobacco comments:    states he only tried as a teenager  Scientific laboratory technician Use: Never used  Substance and Sexual Activity   Alcohol use: No   Drug use: No   Sexual activity: Not Currently  Other Topics Concern   Not on file  Social History Narrative   03/16/18: Lives alone with dog, Ronney Asters, apartment. Wife of  16 years left him about 2 years ago, and pt. has had difficulty adjusting.   Says his brother Francee Piccolo, who lives in [place], is his main contact, source of support, as well as his dog.    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 States he uses painting as additional therapy.    Social Determinants of Health   Financial Resource Strain: Medium Risk (04/06/2021)   Overall Financial Resource Strain (CARDIA)    Difficulty of Paying Living Expenses: Somewhat hard  Food Insecurity: Food Insecurity Present (04/06/2021)   Hunger Vital Sign    Worried About Running Out of Food in the Last Year: Often true    Ran Out of Food in the Last Year: Sometimes true  Transportation Needs: No Transportation Needs (11/30/2021)   PRAPARE - Hydrologist (Medical): No    Lack of Transportation (Non-Medical): No  Physical Activity: Sufficiently Active (04/06/2021)   Exercise Vital Sign    Days of Exercise per Week: 3 days    Minutes of Exercise per Session: 120 min  Stress: No Stress Concern Present (04/06/2021)   [place]    Feeling of Stress : Not at all  Social Connections: Moderately Integrated (04/06/2021)   Social Connection and Isolation Panel [NHANES]    Frequency of Communication with Friends and Family: More than three times a week    Frequency of Social Gatherings with Friends and Family: More than three times a week    Attends Religious Services: More than 4 times per year    Active Member of [employer] or Organizations: Yes    Attends Music therapist: More than 4 times per year    Marital Status: Divorced    There were no vitals filed for this visit. There is no height or weight on file to calculate BMI.  Physical Exam  ASSESSMENT AND PLAN:  There are no diagnoses linked to this encounter.  No orders  of the defined types were placed in this encounter.   No problem-specific Assessment & Plan notes found for this encounter.   No follow-ups on file.  Betty G. Martinique, MD  [facility]. [place] office.

## 2022-03-27 ENCOUNTER — Ambulatory Visit: Payer: 59 | Admitting: Family Medicine

## 2022-03-29 DIAGNOSIS — M79672 Pain in left foot: Secondary | ICD-10-CM | POA: Diagnosis not present

## 2022-03-29 DIAGNOSIS — M7671 Peroneal tendinitis, right leg: Secondary | ICD-10-CM | POA: Diagnosis not present

## 2022-04-01 ENCOUNTER — Encounter (HOSPITAL_BASED_OUTPATIENT_CLINIC_OR_DEPARTMENT_OTHER): Payer: Self-pay | Admitting: Pulmonary Disease

## 2022-04-01 ENCOUNTER — Ambulatory Visit (INDEPENDENT_AMBULATORY_CARE_PROVIDER_SITE_OTHER): Payer: 59 | Admitting: Pulmonary Disease

## 2022-04-01 VITALS — BP 126/80 | HR 76 | Temp 98.4°F | Ht 74.0 in | Wt 265.0 lb

## 2022-04-01 DIAGNOSIS — G4733 Obstructive sleep apnea (adult) (pediatric): Secondary | ICD-10-CM

## 2022-04-01 NOTE — Patient Instructions (Signed)
Follow up in 6 months 

## 2022-04-01 NOTE — Progress Notes (Signed)
[facility]  Chief Complaint  Patient presents with   Follow-up    Follow up on Inspire activation.Pt states that he his machine has been unconnected  and internet went down at his house for the past week.    Constitutional:  BP 126/80 (BP Location: Left Arm, Patient Position: Sitting, Cuff Size: Large)   Pulse 76   Temp 98.4 F (36.9 C) (Oral)   Ht '6\' 2"'$  (1.88 m)   Wt 265 lb (120.2 kg)   SpO2 96%   BMI 34.02 kg/m   Past Medical History:  HA, Low back pain, Migraine HA, Seizures, Personality disorder  Past Surgical History:  His  has a past surgical history that includes Tooth extraction (12/27/2014); Total knee arthroplasty (Left, 11/28/2020); Drug induced endoscopy (N/A, 03/07/2021); Colonoscopy; Implantation of hypoglossal nerve stimulator (Right, 05/16/2021); and Patella-femoral arthroplasty (Right, 12/25/2021).  Brief Summary:  Steven Vincent is a 57 y.o. male former smoker with obstructive sleep apnea.  Intolerant of CPAP and had Inspire device implanted in May 2023.      Subjective:   He was in the process of moving, but this didn't work out.  He had issues with his power and internet.  His power situation is settled, but he won't get internet again until next week.    He is not having any issues using the [facility] device.  Sleeping well.  Not having trouble swallowing or speaking.  No neck pain.  Physical Exam:   Appearance - well kempt   ENMT - no sinus tenderness, no oral exudate, no LAN, Mallampati 3 airway, no stridor  Respiratory - equal breath sounds bilaterally, no wheezing or rales  CV - s1s2 regular rate and rhythm, no murmurs  Ext - no clubbing, no edema  Skin - no rashes  Psych - normal mood and affect     Sleep Tests:  PSG 02/15/17 >> AHI 6.9, SpO2 low 85%.  Supine AHI 10.5, REM AHI 24.3. HST 02/26/18 >> AHI 11.6, SpO2 low 79% HST 07/07/18 >> AHI 7.7, SpO2 low 83% PSG 08/15/21 >> optimal level  0.9 V  Cardiac Tests:  Echo 04/28/19 >> EF 60 to 65%, grade 1 DD, ascending aorta 40 mm  Social History:  He  reports that he quit smoking about 32 years ago. His smoking use included cigarettes. He has a 1.00 pack-year smoking history. He has been exposed to tobacco smoke. He has never used smokeless tobacco. He reports that he does not drink alcohol and does not use drugs.  Family History:  His family history includes Alzheimer's disease in his father; Aneurysm in his mother; Heart disease in his father; Hyperlipidemia in his father; Hypertension in his brother and father; Other in his father; Stroke in his brother.     Assessment/Plan:   Obstructive sleep apnea. - intolerant of CPAP - had Inspire device implanted in May 2023 - amplitude 0.9 V with patient control range 0.7 to 1.1 V - will need to arrange for device to be interrogated - will likely need to arrange for home sleep study after next follow up to monitor therapy   Time Spent Involved in Patient Care on Day of Examination:  25 minutes  Follow up:   Patient Instructions  Follow up in 6 months  Medication List:   Allergies as of 04/01/2022       Reactions   Delsym [dextromethorphan] Other (See Comments)   Gets loopy/memory loss   Aspirin Nausea And Vomiting  GI upset   Mucinex D [pseudoephedrine-guaifenesin Er] Hypertension   Ibuprofen Other (See Comments)   Stomach irritation        Medication List        Accurate as of April 01, 2022 10:40 AM. If you have any questions, ask your nurse or doctor.          Aimovig 140 MG/ML Soaj Generic drug: Erenumab-aooe Inject 140 mg into the skin every 30 (thirty) days.   aspirin EC 81 MG tablet Take 1 tablet (81 mg total) by mouth 2 (two) times daily after a meal. For 2 weeks then once a day for 2 weeks for dvt prevention.   CORICIDIN HBP PO Take 2 tablets by mouth 2 (two) times daily as needed (congestion).   Dialyvite Vitamin D 5000 125 MCG (5000  UT) capsule Generic drug: Cholecalciferol Take 5,000 Units by mouth daily.   diclofenac 1.3 % Ptch Commonly known as: FLECTOR Place 1 patch onto the skin daily as needed (pain).   diclofenac Sodium 1 % Gel Commonly known as: VOLTAREN Apply 1 application. topically 4 (four) times daily as needed (pain).   HYDROcodone-acetaminophen 5-325 MG tablet Commonly known as: NORCO/VICODIN Take 1-2 tablets by mouth every 6 (six) hours as needed for moderate pain or severe pain (post op pain).   ketoconazole 2 % cream Commonly known as: NIZORAL Apply 1 Application topically daily.   Melatonin 5 MG Caps Take 5 mg by mouth at bedtime.   Multaq 400 MG tablet Generic drug: dronedarone Take 1 tablet (400 mg total) by mouth 2 (two) times daily with a meal.   nitroGLYCERIN 0.4 MG SL tablet Commonly known as: NITROSTAT Place 1 tablet (0.4 mg total) under the tongue every 5 (five) minutes as needed for chest pain.   pantoprazole 20 MG tablet Commonly known as: Protonix Take 1 tablet (20 mg total) by mouth daily before breakfast.   pravastatin 20 MG tablet Commonly known as: PRAVACHOL Take 1 tablet (20 mg total) by mouth daily.   rOPINIRole 0.5 MG tablet Commonly known as: REQUIP Take 1 tablet (0.5 mg total) by mouth at bedtime.   SUMAtriptan 6 MG/0.5ML Soaj INJECT 6 MG INTO THE SKIN DAILY AS NEEDED AT MIGRAINE ONSET. MAY REPEAT 1 TIME AFTER 1 HOUR IF NEEDED   tiZANidine 4 MG tablet Commonly known as: Zanaflex Take 1 tablet (4 mg total) by mouth every 6 (six) hours as needed for muscle spasms.   topiramate 50 MG tablet Commonly known as: TOPAMAX Take one tablet in the AM and two tablets in the PM.   triamcinolone cream 0.1 % Commonly known as: KENALOG Apply 1 Application topically 2 (two) times daily as needed (rash).   VITAMIN C PO Take 1 tablet by mouth daily.        Signature:  [name], MD [place] Pager - [phone] 04/01/2022, 10:40  AM

## 2022-04-09 ENCOUNTER — Ambulatory Visit (INDEPENDENT_AMBULATORY_CARE_PROVIDER_SITE_OTHER): Payer: 59 | Admitting: Family Medicine

## 2022-04-09 DIAGNOSIS — Z0000 Encounter for general adult medical examination without abnormal findings: Secondary | ICD-10-CM

## 2022-04-09 NOTE — Progress Notes (Signed)
Error

## 2022-04-09 NOTE — Progress Notes (Signed)
PATIENT CHECK-IN and HEALTH RISK ASSESSMENT QUESTIONNAIRE:   -completed by phone/video for upcoming Medicare Preventive Visit   Pre-Visit Check-in: 1)Vitals (height, wt, BP, etc) - record in vitals section for visit on day of visit 2)Review and Update Medications, Allergies PMH, Surgeries, Social history in Epic 3)Hospitalizations in the last year with date/reason?  Yes-1 time 4)Review and Update Care Team (patient's specialists) in Epic 5) Complete PHQ9 in Epic  6) Complete Fall Screening in Epic 7)Review all Health Maintenance Due and order under PCP if not done.   Medicare Wellness Patient Questionnaire:   Answer theses question about your habits: Do you drink alcohol? No  If yes, how many drinks do you have a day?No Have you ever smoked?No  Quit date if applicable?  No How many packs a day do/did you smoke? no Do you use smokeless tobacco?No Do you use an illicit drugs?No Do you exercises? 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 No Number of partners?no Typical breakfast: coffee, cereal  He is trying to eat healthy and has lost 30 lbs, cut out bread and snacks - eats a lots of veggies and fruits Typical lunch: No Typical dinner: fish, vegs Typical snacks:no snacks  Beverages: coffee, water   Answer theses question about you: Can you perform most household chores?Yes Do you find it hard to follow a conversation in a noisy room?Yes, pt will receive hearing aid tomorrow Do you often ask people to speak up or repeat themselves?Yes Do you feel that you have a problem with memory?yes, at times Do you balance your checkbook and or bank acounts?No Do you feel safe at home?Yes Last dentist visit?1 year ago Do you need assistance with any of the following: Please note if so No assistance needed             Driving?             Feeding yourself?             Getting from bed  to chair?             Getting to the toilet?             Bathing or showering?             Dressing yourself?             Managing money?             Climbing a flight of stairs             Preparing meals?       Do you have Advanced Directives in place (Living Will, Healthcare Power or Attorney)? Yes     Last eye Exam and location?1 year,  [place]     Do you currently use prescribed or non-prescribed narcotic or opioid pain medications?Yes - but only takes one low does pill daily for back pain so that he can continue working, understands risks and reports is using cautiously   Do you have a history or close family history of breast, ovarian, tubal or peritoneal cancer or a family member with BRCA (breast cancer susceptibility 1 and 2) gene mutations?   Nurse/Assistant Credentials/time stamp: ST       ----------------------------------------------------------------------------------------------------------------------------------------------------------------------------------------------------------------------    MEDICARE ANNUAL PREVENTIVE CARE VISIT WITH PROVIDER (Welcome to [employer], initial annual wellness or annual wellness exam)  Virtual Visit via  Phone Note  I connected with Tyga  on 04/09/22 by phone and verified that I am speaking with the correct person using two identifiers.  Location patient: home Location provider:work or home office Persons participating in the virtual visit: patient, provider  Concerns and/or follow up today: no new concerns other than working with pharmacy to get his refills - reports doc sent in all the refills.  saw PCP recently.    See HM section in Epic for other details of completed HM.    ROS: negative for report of fevers, unintentional weight loss, vision changes, vision loss, hearing loss or change, chest pain, sob, hemoptysis, melena, hematochezia, hematuria, genital discharge or lesions, falls, bleeding or bruising, loc,  thoughts of suicide or self harm, memory loss  Patient-completed extensive health risk assessment - reviewed and discussed with the patient: See Health Risk Assessment completed with patient prior to the visit either above or in recent phone note. This was reviewed in detailed with the patient today and appropriate recommendations, orders and referrals were placed as needed per Summary below and patient instructions.   Review of Medical History: -PMH, PSH, Family History and current specialty and care providers reviewed and updated and listed below   Patient Care Team: Martinique, Betty G, MD as PCP - General (Family Medicine) Minus Breeding, MD as PCP - Cardiology (Cardiology) Constance Haw, MD as PCP - Electrophysiology (Cardiology) Norma Fredrickson, MD as Consulting Physician (Psychiatry) Minus Breeding, MD as Consulting Physician (Cardiology) Phylliss Bob, MD as Consulting Physician (Orthopedic Surgery) Kathrynn Ducking, MD (Inactive) as Consulting Physician (Neurology) Viona Gilmore, [facility] (Inactive) as Pharmacist (Pharmacist)   Past Medical History:  Diagnosis Date   Arthritis    Baker's cyst    Cancer [facility])    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   Dysrhythmia    bradycardia   Headache(784.0)    HLD (hyperlipidemia)    Knee pain    Low back pain    Migraines    OSA (obstructive sleep apnea) 04/10/2017   Personality disorder ([place])    Seizures ([place])     Past Surgical History:  Procedure Laterality Date   COLONOSCOPY     normal   DRUG INDUCED ENDOSCOPY N/A 03/07/2021   Procedure: DRUG INDUCED SLEEP ENDOSCOPY;  Surgeon: Melida Quitter, MD;  [facility];  Service: ENT;  Laterality: N/A;   IMPLANTATION OF HYPOGLOSSAL NERVE STIMULATOR Right 05/16/2021   Procedure: IMPLANTATION OF HYPOGLOSSAL NERVE STIMULATOR;  Surgeon: Melida Quitter, MD;  Location: [facility];  Service: ENT;  Laterality: Right;   PATELLA-FEMORAL ARTHROPLASTY Right 12/25/2021   Procedure: RIGHT  PATELLOFEMORAL REPLACEMENT;  Surgeon: Melrose Nakayama, MD;  Location: [facility];  Service: Orthopedics;  Laterality: Right;   TOOTH EXTRACTION  12/27/2014   lower left   TOTAL KNEE ARTHROPLASTY Left 11/28/2020   Procedure: LEFT KNEE PATELLAFEMORL REPLACEMENT;  Surgeon: Melrose Nakayama, MD;  Location: [facility];  Service: Orthopedics;  Laterality: Left;    Social History   Socioeconomic History   Marital status: Divorced    Spouse name: Not on file   Number of children: 0   Years of education: Not on file   Highest education level: Not on file  Occupational History   Occupation: disabled  Tobacco Use   Smoking status: Former    Packs/day: 1.00    Years: 1.00    Total pack years: 1.00    Types: Cigarettes    Quit date: 1992    Years since quitting: 29.1  Passive exposure: Past   Smokeless tobacco: Never   Tobacco comments:    Pt stopped smoking in the early 90's  Vaping Use   Vaping Use: Never used  Substance and Sexual Activity   Alcohol use: No   Drug use: No   Sexual activity: Not Currently  Other Topics Concern   Not on file  Social History Narrative   03/16/18: Lives alone with dog, Ronney Asters, apartment. Wife of 16 years left him about 2 years ago, and pt. has had difficulty adjusting.   Says his brother Francee Piccolo, who lives in [place], is his main contact, source of support, as well as his dog.    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 States he uses painting as additional therapy.    Social Determinants of Health   Financial Resource Strain: Medium Risk (04/06/2021)   Overall Financial Resource Strain (CARDIA)    Difficulty of Paying Living Expenses: Somewhat hard  Food Insecurity: Food Insecurity Present (04/06/2021)   Hunger Vital Sign    Worried About Running Out of Food in the Last Year: Often true    Ran Out of Food in the Last Year: Sometimes true  Transportation Needs:  No Transportation Needs (11/30/2021)   PRAPARE - Hydrologist (Medical): No    Lack of Transportation (Non-Medical): No  Physical Activity: Sufficiently Active (04/06/2021)   Exercise Vital Sign    Days of Exercise per Week: 3 days    Minutes of Exercise per Session: 120 min  Stress: No Stress Concern Present (04/06/2021)   [place]    Feeling of Stress : Not at all  Social Connections: Moderately Integrated (04/06/2021)   Social Connection and Isolation Panel [NHANES]    Frequency of Communication with Friends and Family: More than three times a week    Frequency of Social Gatherings with Friends and Family: More than three times a week    Attends Religious Services: More than 4 times per year    Active Member of [employer] or Organizations: Yes    Attends Archivist Meetings: More than 4 times per year    Marital Status: Divorced  Intimate Partner Violence: Unknown (04/06/2021)   Humiliation, Afraid, Rape, and Kick questionnaire    Fear of Current or Ex-Partner: No    Emotionally Abused: No    Physically Abused: Not on file    Sexually Abused: No    Family History  Problem Relation Age of Onset   Heart disease Father        defibrilator   Hyperlipidemia Father    Hypertension Father    Alzheimer's disease Father    Other Father        stents in legs   Aneurysm Mother        brain   Hypertension Brother    Stroke Brother        X2    Current Outpatient Medications on File Prior to Visit  Medication Sig Dispense Refill   diclofenac (FLECTOR) 1.3 % PTCH Place 1 patch onto the skin daily as needed (pain).     diclofenac Sodium (VOLTAREN) 1 % GEL Apply 1 application. topically 4 (four) times daily as needed (pain).     dronedarone (MULTAQ) 400 MG tablet Take 1 tablet (400 mg total) by mouth 2 (two) times daily with a meal. 180 tablet 3  Erenumab-aooe (AIMOVIG) 140 MG/ML SOAJ  Inject 140 mg into the skin every 30 (thirty) days. 3 mL 3   HYDROcodone-acetaminophen (NORCO/VICODIN) 5-325 MG tablet Take 1-2 tablets by mouth every 6 (six) hours as needed for moderate pain or severe pain (post op pain). 40 tablet 0   ketoconazole (NIZORAL) 2 % cream Apply 1 Application topically daily. 30 g 0   Melatonin 5 MG CAPS Take 5 mg by mouth at bedtime.     nitroGLYCERIN (NITROSTAT) 0.4 MG SL tablet Place 1 tablet (0.4 mg total) under the tongue every 5 (five) minutes as needed for chest pain. 25 tablet 3   pantoprazole (PROTONIX) 20 MG tablet Take 1 tablet (20 mg total) by mouth daily before breakfast. 90 tablet 1   pravastatin (PRAVACHOL) 20 MG tablet Take 1 tablet (20 mg total) by mouth daily. 90 tablet 3   rOPINIRole (REQUIP) 0.5 MG tablet Take 1 tablet (0.5 mg total) by mouth at bedtime. 90 tablet 1   SUMAtriptan 6 MG/0.5ML SOAJ INJECT 6 MG INTO THE SKIN DAILY AS NEEDED AT MIGRAINE ONSET. MAY REPEAT 1 TIME AFTER 1 HOUR IF NEEDED 6 mL 11   tiZANidine (ZANAFLEX) 4 MG tablet Take 1 tablet (4 mg total) by mouth every 6 (six) hours as needed for muscle spasms. 40 tablet 1   topiramate (TOPAMAX) 50 MG tablet Take one tablet in the AM and two tablets in the PM. 270 tablet 3   triamcinolone cream (KENALOG) 0.1 % Apply 1 Application topically 2 (two) times daily as needed (rash).     No current facility-administered medications on file prior to visit.    Allergies  Allergen Reactions   Delsym [Dextromethorphan] Other (See Comments)    Gets loopy/memory loss   Aspirin Nausea And Vomiting    GI upset    Mucinex D [Pseudoephedrine-Guaifenesin Er] Hypertension   Ibuprofen Other (See Comments)    Stomach irritation       Physical Exam There were no vitals filed for this visit. Estimated body mass index is 34.02 kg/m as calculated from the following:   Height as of 04/01/22: 6' 2"$  (1.88 m).   Weight as of 04/01/22: 265 lb (120.2 kg).  EKG (optional): deferred due to virtual  visit  GENERAL: alert, oriented, no acute distress detected; full vision exam deferred due to pandemic and/or virtual encounter  PSYCH/NEURO: pleasant and cooperative, no obvious depression or anxiety, speech and thought processing grossly intact, Cognitive function grossly intact  [place] Office Visit from 04/09/2022 in [place] at [facility]  PHQ-9 Total Score 1           04/09/2022    5:23 PM 02/05/2022    7:38 AM 11/19/2021    9:58 AM 08/17/2021   10:15 AM 04/06/2021    1:59 PM  Depression screen PHQ 2/9  Decreased Interest 0 0 0 0 0  Down, Depressed, Hopeless 0 0 0 0 0  PHQ - 2 Score 0 0 0 0 0  Altered sleeping 1  1    Tired, decreased energy 0  2    Change in appetite 0  0    Feeling bad or failure about yourself  0  0    Trouble concentrating 0  0    Moving slowly or fidgety/restless 0  0    Suicidal thoughts 0  0    PHQ-9 Score 1  3    Difficult doing work/chores Not difficult at all  Not difficult at all  05/16/2021    8:22 AM 08/17/2021   10:15 AM 11/19/2021    9:59 AM 02/05/2022    7:38 AM 04/09/2022    5:22 PM  Fall Risk  Falls in the past year?  0 1 0   Was there an injury with Fall?  0 0 0 0  Fall Risk Category Calculator  0 1 0   Fall Risk Category (Retired)  Low Low Low   (RETIRED) Patient Fall Risk Level Low fall risk Low fall risk Low fall risk Low fall risk   Patient at Risk for Falls Due to  No Fall Risks Other (Comment) No Fall Risks   Fall risk Follow up  Falls evaluation completed Falls evaluation completed Falls evaluation completed      SUMMARY AND PLAN:  Encounter for Medicare annual wellness exam   Discussed applicable health maintenance/preventive health measures and advised and referred or ordered per patient preferences:  Health Maintenance  Topic Date Due   Zoster Vaccines- Shingrix (1 of 2) Never done, considering   INFLUENZA VACCINE  05/26/2022 (Originally 09/25/2021), declined   HIV Screening   03/17/2023 (Originally [birth year]1980)   COVID-19 Vaccine (1) 04/04/2027 (Originally 08/07/1970), declined   Medicare Annual Wellness (AWV)  04/10/2023   COLONOSCOPY (Pts 45-78yr Insurance coverage will need to be confirmed)  09/20/2025   Hepatitis C Screening  Completed   HPV VACCINES  Aged Out   DTaP/Tdap/Td  Discontinued   Did prostate ca screening with PCP in december  Education and counseling on the following was provided based on the above review of health and a plan/checklist for the patient, along with additional information discussed, was provided for the patient in the patient instructions :    -Advised and counseled on maintaining healthy weight and healthy lifestyle - including the importance of a health diet, regular physical activity, social connections and stress management. -congratulated him on the amazing changes and progress he has made and advised to continue. Advised and counseled on a whole foods based healthy diet and regular exercise. A summary of a healthy diet was provided in the Patient Instructions. -congratulated on regular daily physical activity and encouraged to continue. Resources provided in handout.  -Advise yearly dental visits at minimum and regular eye exams  Follow up: see patient instructions   Patient Instructions  I really enjoyed getting to talk with you today! I am available on Tuesdays and Thursdays for virtual visits if you have any questions or concerns, or if I can be of any further assistance.   CHECKLIST FROM ANNUAL WELLNESS VISIT:  -Follow up (please call to schedule if not scheduled after visit):  -Inperson visit with your Primary Doctor office: as planned -yearly for annual wellness visit with primary care office  Here is a list of your preventive care/health maintenance measures and the plan for each if any are due:  Health Maintenance  Topic Date Due   Zoster Vaccines- Shingrix (1 of 2) Never done, can get at pharmacy if you wish to  do.   INFLUENZA VACCINE  05/26/2022 (Originally 09/25/2021)   HIV Screening  03/17/2023 (Originally [birth year]1980)   COVID-19 Vaccine (1) 04/04/2027 (Originally 08/07/1970)   Medicare Annual Wellness (AWV)  04/10/2023   COLONOSCOPY (Pts 45-411yrInsurance coverage will need to be confirmed)  09/20/2025   Hepatitis C Screening  Completed   HPV VACCINES  Aged Out   DTaP/Tdap/Td  Discontinued    -See a dentist at least yearly  -Get your eyes checked and then per your  eye specialist's recommendations  -Other issues addressed today: Keep up the awesome dietary changes and regular exercise!!! Was great talking with you!  -I have included below further information regarding a healthy whole foods based diet, physical activity guidelines for adults, stress management and opportunities for social connections. I hope you find this information useful.   -----------------------------------------------------------------------------------------------------------------------------------------------------------------------------------------------------------------------------------------------------------  NUTRITION: -eat real food: lots of colorful vegetables (half the plate) and fruits -5-7 servings of vegetables and fruits per day (fresh or steamed is best), exp. 2 servings of vegetables with lunch and dinner and 2 servings of fruit per day. Berries and greens such as kale and collards are great choices.  -consume on a regular basis: whole grains (make sure first ingredient on label contains the word "whole"), fresh fruits, fish, nuts, seeds, healthy oils (such as olive oil, avocado oil, grape seed oil) -may eat small amounts of dairy and lean meat on occasion, but avoid processed meats such as ham, bacon, lunch meat, etc. -drink water -try to avoid fast food and pre-packaged foods, processed meat -most experts advise limiting sodium to < 2368m per day, should limit further is any chronic conditions such as  high blood pressure, heart disease, diabetes, etc. The American Heart Association advised that < 15030mis is ideal -try to avoid foods that contain any ingredients with names you do not recognize  -try to avoid sugar/sweets (except for the natural sugar that occurs in fresh fruit) -try to avoid sweet drinks -try to avoid white rice, white bread, pasta (unless whole grain), white or yellow potatoes  EXERCISE GUIDELINES FOR ADULTS: -if you wish to increase your physical activity, do so gradually and with the approval of your doctor -STOP and seek medical care immediately if you have any chest pain, chest discomfort or trouble breathing when starting or increasing exercise  -move and stretch your body, legs, feet and arms when sitting for long periods -Physical activity guidelines for optimal health in adults: -least 150 minutes per week of aerobic exercise (can talk, but not sing) once approved by your doctor, 20-30 minutes of sustained activity or two 10 minute episodes of sustained activity every day.  -resistance training at least 2 days per week if approved by your doctor -balance exercises 3+ days per week:   Stand somewhere where you have something sturdy to hold onto if you lose balance.    1) lift up on toes, start with 5x per day and work up to 20x   2) stand and lift on leg straight out to the side so that foot is a few inches of the floor, start with 5x each side and work up to 20x each side   3) stand on one foot, start with 5 seconds each side and work up to 20 seconds on each side  If you need ideas or help with getting more active:  -Silver sneakers https://tools.silversneakers.com  -Walk with a Doc: Hthttp://stephens-thompson.biz/-try to include resistance (weight lifting/strength building) and balance exercises twice per week: or the following link for  ideas: htChessContest.frhtUpdateClothing.com.cySTRESS MANAGEMENT: -can try meditating, or just sitting quietly with deep breathing while intentionally relaxing all parts of your body for 5 minutes daily -if you need further help with stress, anxiety or depression please follow up with your primary doctor or contact the wonderful folks at [place] [place] you wish to engage in more social and exercise related activities:  -Silver sneakers https://tools.silversneakers.com  -Walk  with a Doc: http://stephens-thompson.biz/  -Check out the [place] 50+ section on the [place] [employer] (hiking clubs, book clubs, cards and games, chess, exercise classes, aquatic classes and much more) - see the website for details: https://www.[place].gov/departments/parks-recreation/active-adults50  -YouTube has lots of exercise videos for different ages and abilities as well  -[place] (a variety of indoor and outdoor inperson activities for adults). [phone]. [address]  -Virtual Online Classes (a variety of topics): see seniorplanet.org or call [phone]  -consider volunteering at a school, hospice center, church, senior center or elsewhere           Lucretia Kern, DO

## 2022-04-09 NOTE — Patient Instructions (Signed)
I really enjoyed getting to talk with you today! I am available on Tuesdays and Thursdays for virtual visits if you have any questions or concerns, or if I can be of any further assistance.   CHECKLIST FROM ANNUAL WELLNESS VISIT:  -Follow up (please call to schedule if not scheduled after visit):  -Inperson visit with your Primary Doctor office: as planned -yearly for annual wellness visit with primary care office  Here is a list of your preventive care/health maintenance measures and the plan for each if any are due:  Health Maintenance  Topic Date Due   Zoster Vaccines- Shingrix (1 of 2) Never done, can get at pharmacy if you wish to do.   INFLUENZA VACCINE  05/26/2022 (Originally 09/25/2021)   HIV Screening  03/17/2023 (Originally 08/06/1980)   COVID-19 Vaccine (1) 04/04/2027 (Originally [birth year]07/1970)   Medicare Annual Wellness (AWV)  04/10/2023   COLONOSCOPY (Pts 45-51yr Insurance coverage will need to be confirmed)  09/20/2025   Hepatitis C Screening  Completed   HPV VACCINES  Aged Out   DTaP/Tdap/Td  Discontinued    -See a dentist at least yearly  -Get your eyes checked and then per your eye specialist's recommendations  -Other issues addressed today: Keep up the awesome dietary changes and regular exercise!!! Was great talking with you!  -I have included below further information regarding a healthy whole foods based diet, physical activity guidelines for adults, stress management and opportunities for social connections. I hope you find this information useful.   -----------------------------------------------------------------------------------------------------------------------------------------------------------------------------------------------------------------------------------------------------------  NUTRITION: -eat real food: lots of colorful vegetables (half the plate) and fruits -5-7 servings of vegetables and fruits per day (fresh or steamed is best), exp. 2  servings of vegetables with lunch and dinner and 2 servings of fruit per day. Berries and greens such as kale and collards are great choices.  -consume on a regular basis: whole grains (make sure first ingredient on label contains the word "whole"), fresh fruits, fish, nuts, seeds, healthy oils (such as olive oil, avocado oil, grape seed oil) -may eat small amounts of dairy and lean meat on occasion, but avoid processed meats such as ham, bacon, lunch meat, etc. -drink water -try to avoid fast food and pre-packaged foods, processed meat -most experts advise limiting sodium to < 23040mper day, should limit further is any chronic conditions such as high blood pressure, heart disease, diabetes, etc. The American Heart Association advised that < 150053ms is ideal -try to avoid foods that contain any ingredients with names you do not recognize  -try to avoid sugar/sweets (except for the natural sugar that occurs in fresh fruit) -try to avoid sweet drinks -try to avoid white rice, white bread, pasta (unless whole grain), white or yellow potatoes  EXERCISE GUIDELINES FOR ADULTS: -if you wish to increase your physical activity, do so gradually and with the approval of your doctor -STOP and seek medical care immediately if you have any chest pain, chest discomfort or trouble breathing when starting or increasing exercise  -move and stretch your body, legs, feet and arms when sitting for long periods -Physical activity guidelines for optimal health in adults: -least 150 minutes per week of aerobic exercise (can talk, but not sing) once approved by your doctor, 20-30 minutes of sustained activity or two 10 minute episodes of sustained activity every day.  -resistance training at least 2 days per week if approved by your doctor -balance exercises 3+ days per week:   Stand somewhere where you have something sturdy to  hold onto if you lose balance.    1) lift up on toes, start with 5x per day and work up to  20x   2) stand and lift on leg straight out to the side so that foot is a few inches of the floor, start with 5x each side and work up to 20x each side   3) stand on one foot, start with 5 seconds each side and work up to 20 seconds on each side  If you need ideas or help with getting more active:  -Silver sneakers https://tools.silversneakers.com  -Walk with a Doc: http://stephens-thompson.biz/  -try to include resistance (weight lifting/strength building) and balance exercises twice per week: or the following link for ideas: ChessContest.fr  UpdateClothing.com.cy  STRESS MANAGEMENT: -can try meditating, or just sitting quietly with deep breathing while intentionally relaxing all parts of your body for 5 minutes daily -if you need further help with stress, anxiety or depression please follow up with your primary doctor or contact the wonderful folks at [place]: [place]: -options in [place] if you wish to engage in more social and exercise related activities:  -Silver sneakers https://tools.silversneakers.com  -Walk with a Doc: http://stephens-thompson.biz/  -Check out the [place] 50+ section on the [place] of [employer] (hiking clubs, book clubs, cards and games, chess, exercise classes, aquatic classes and much more) - see the website for details: https://www.[place].gov/departments/parks-recreation/active-adults50  -YouTube has lots of exercise videos for different ages and abilities as well  -[place] (a variety of indoor and outdoor inperson activities for adults). [phone]. [address]  -Virtual Online Classes (a variety of topics): see seniorplanet.org or call ([phone]  -consider volunteering at a school, hospice center, church, senior center or elsewhere

## 2022-04-10 ENCOUNTER — Other Ambulatory Visit: Payer: Self-pay

## 2022-04-10 MED ORDER — MULTAQ 400 MG PO TABS: 400.0000 mg | ORAL_TABLET | Freq: Two times a day (BID) | ORAL | 1 refills | Status: DC

## 2022-04-10 MED ORDER — PRAVASTATIN SODIUM 20 MG PO TABS: 20.0000 mg | ORAL_TABLET | Freq: Every day | ORAL | 1 refills | Status: DC

## 2022-04-10 MED ORDER — NITROGLYCERIN 0.4 MG SL SUBL: 0.4000 mg | SUBLINGUAL_TABLET | SUBLINGUAL | 1 refills | Status: DC | PRN

## 2022-04-25 ENCOUNTER — Telehealth: Payer: Self-pay | Admitting: Neurology

## 2022-04-25 MED ORDER — AIMOVIG 140 MG/ML ~~LOC~~ SOAJ: 140.0000 mg | SUBCUTANEOUS | 0 refills | Status: DC

## 2022-04-25 NOTE — Telephone Encounter (Signed)
Please inform patient, I have sent one refill for him but he will need to keep  appointment for further refills because it has been a year since we have seen him

## 2022-04-25 NOTE — Telephone Encounter (Signed)
Pt request refill for Erenumab-aooe (AIMOVIG) 140 MG/ML SOAJ at [facility]

## 2022-04-29 NOTE — Telephone Encounter (Signed)
Called pt and informed him of message nurse Katie sent. Pt said okay and he will be at next appointment.

## 2022-04-30 ENCOUNTER — Other Ambulatory Visit: Payer: Self-pay | Admitting: *Deleted

## 2022-04-30 MED ORDER — TOPIRAMATE 50 MG PO TABS: ORAL_TABLET | ORAL | 3 refills | Status: DC

## 2022-04-30 NOTE — Progress Notes (Signed)
Received fax from [facility] asking for refills of Topiramate.

## 2022-05-07 DIAGNOSIS — M25521 Pain in right elbow: Secondary | ICD-10-CM | POA: Diagnosis not present

## 2022-05-07 NOTE — Progress Notes (Unsigned)
PATIENT: Steven Vincent DOB: 1965-06-23  REASON FOR VISIT: follow up for migraines, seizures HISTORY FROM: patient PRIMARY NEUROLOGIST: Dr. Brett Fairy   HISTORY OF PRESENT ILLNESS: Today 05/08/22 Gave his last shot of Aimovig today. Had right patella replacement. Aimovig is working well. Gets 1 migraine a year. Uses Imitrex injection only 1 a year. No seizures, still on Topamax, last seizure was early 2000's, this occurred when took off Topamax. Describes as twitching all over, is alert, but speech is garbled. Has INSPIRE device implanted May 2023. Has a room mate , he drives a car. Still looking for permanent housing, renting a house now. Starting new job at [employer] next week. Takes Requip 0.5 mg at bedtime, helps with RLS, legs ache at night. 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   Update 05/02/21 SS: [name] here today for follow-up with history of migraines and seizures.  On Topamax and Aimovig.  He tapered off nortriptyline.  Takes Imitrex injection for acute headache. Headaches doing well, triggered by storms. Aimovig works great, no side effects. Hasn't need Imitrex injection since October when he had knee surgery, his medications are expired. He is having inspire implanted coming up. Working on weight loss. No seizures, on Topamax, last seizure was early 2000's. Requip at night helps his legs. He works at [employer] part time, 3 days a week. Living in a garage, waiting to get housing approved. His emotional support service dog passed away last year.   26-May-2020 SS: [name] is a 57 year old male with history of migraine headache and seizures.  Migraines are well controlled with Aimovig and nortriptyline.  He has Imitrex injection for acute headache, hasn't needed since he started the Aimovig.  Seizures are well controlled with Topamax. He moved to [place]. He is on CPAP for OSA, isn't using it, claims he can't breathe. Marland KitchenHe is working at [employer]. Has a bad left knee. Lives with an 47  year old woman friend, this is stressful for him, he has no other place to go. He doesn't sleep well because his bed is in storage, working on getting a place to live of his own in [place]. He has an emotional support dog. He drives a car.  Here today for evaluation unaccompanied.  HISTORY JM: Today, 07/14/2019, [name] returns for follow-up regarding migraine headache and seizures.  Migraine headaches have been well controlled with use of Aimovig and nortriptyline.  Imitrex injection for emergent use as needed. He reports last migraine in April but otherwise has not had one in a while.  Denies recurrent seizure activity with ongoing use of Topamax. He does report moving to [place] at the end of the month. He does plan on establishing care with PCP and neurology in [place]. No concerns at this time.      REVIEW OF SYSTEMS: Out of a complete 14 system review of symptoms, the patient complains only of the following symptoms, and all other reviewed systems are negative.  See HPI  ALLERGIES: Allergies  Allergen Reactions   Delsym [Dextromethorphan] Other (See Comments)    Gets loopy/memory loss   Aspirin Nausea And Vomiting    GI upset    Mucinex D [Pseudoephedrine-Guaifenesin Er] Hypertension   Ibuprofen Other (See Comments)    Stomach irritation    HOME MEDICATIONS: Outpatient Medications Prior to Visit  Medication Sig Dispense Refill   diclofenac (FLECTOR) 1.3 % PTCH Place 1 patch onto the skin daily as needed (pain).     diclofenac Sodium (VOLTAREN) 1 %  GEL Apply 1 application. topically 4 (four) times daily as needed (pain).     dronedarone (MULTAQ) 400 MG tablet Take 1 tablet (400 mg total) by mouth 2 (two) times daily with a meal. 180 tablet 1   HYDROcodone-acetaminophen (NORCO/VICODIN) 5-325 MG tablet Take 1-2 tablets by mouth every 6 (six) hours as needed for moderate pain or severe pain (post op pain). 40 tablet 0   ketoconazole (NIZORAL) 2 % cream Apply 1 Application  topically daily. 30 g 0   Melatonin 5 MG CAPS Take 5 mg by mouth at bedtime.     nitroGLYCERIN (NITROSTAT) 0.4 MG SL tablet Place 1 tablet (0.4 mg total) under the tongue every 5 (five) minutes x 3 doses as needed for chest pain (If no relief after 3rd dose, go to ER.). 25 tablet 1   pantoprazole (PROTONIX) 20 MG tablet Take 1 tablet (20 mg total) by mouth daily before breakfast. 90 tablet 1   pravastatin (PRAVACHOL) 20 MG tablet Take 1 tablet (20 mg total) by mouth daily. 90 tablet 1   SUMAtriptan 6 MG/0.5ML SOAJ INJECT 6 MG INTO THE SKIN DAILY AS NEEDED AT MIGRAINE ONSET. MAY REPEAT 1 TIME AFTER 1 HOUR IF NEEDED 6 mL 11   tiZANidine (ZANAFLEX) 4 MG tablet Take 1 tablet (4 mg total) by mouth every 6 (six) hours as needed for muscle spasms. 40 tablet 1   triamcinolone cream (KENALOG) 0.1 % Apply 1 Application topically 2 (two) times daily as needed (rash).     Erenumab-aooe (AIMOVIG) 140 MG/ML SOAJ Inject 140 mg into the skin every 30 (thirty) days. 3 mL 0   rOPINIRole (REQUIP) 0.5 MG tablet Take 1 tablet (0.5 mg total) by mouth at bedtime. 90 tablet 1   topiramate (TOPAMAX) 50 MG tablet Take one tablet in the AM and two tablets in the PM. 270 tablet 3   No facility-administered medications prior to visit.    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   Dysrhythmia    bradycardia   Headache(784.0)    HLD (hyperlipidemia)    Knee pain    Low back pain    Migraines    OSA (obstructive sleep apnea) 04/10/2017   Personality disorder ([place])    Seizures ([place])     PAST SURGICAL HISTORY: Past Surgical History:  Procedure Laterality Date   COLONOSCOPY     normal   DRUG INDUCED ENDOSCOPY N/A 03/07/2021   Procedure: DRUG INDUCED SLEEP ENDOSCOPY;  Surgeon: Melida Quitter, MD;  Location: [facility];  Service: ENT;  Laterality: N/A;   IMPLANTATION OF HYPOGLOSSAL NERVE STIMULATOR Right 05/16/2021    Procedure: IMPLANTATION OF HYPOGLOSSAL NERVE STIMULATOR;  Surgeon: Melida Quitter, MD;  Location: [facility];  Service: ENT;  Laterality: Right;   PATELLA-FEMORAL ARTHROPLASTY Right 12/25/2021   Procedure: RIGHT PATELLOFEMORAL REPLACEMENT;  Surgeon: Melrose Nakayama, MD;  Location: [facility];  Service: Orthopedics;  Laterality: Right;   TOOTH EXTRACTION  12/27/2014   lower left   TOTAL KNEE ARTHROPLASTY Left 11/28/2020   Procedure: LEFT KNEE PATELLAFEMORL REPLACEMENT;  Surgeon: Melrose Nakayama, MD;  Location: [facility];  Service: Orthopedics;  Laterality: Left;    FAMILY HISTORY: Family History  Problem Relation Age of Onset   Heart disease Father        defibrilator   Hyperlipidemia Father    Hypertension Father    Alzheimer's disease  Father    Other Father        stents in legs   Aneurysm Mother        brain   Hypertension Brother    Stroke Brother        X2    SOCIAL HISTORY: Social History   Socioeconomic History   Marital status: Divorced    Spouse name: Not on file   Number of children: 0   Years of education: Not on file   Highest education level: Not on file  Occupational History   Occupation: disabled  Tobacco Use   Smoking status: Former    Packs/day: 1.00    Years: 1.00    Total pack years: 1.00    Types: Cigarettes    Quit date: 1992    Years since quitting: 32.2    Passive exposure: Past   Smokeless tobacco: Never   Tobacco comments:    Pt stopped smoking in the early 90's  Vaping Use   Vaping Use: Never used  Substance and Sexual Activity   Alcohol use: No   Drug use: No   Sexual activity: Not Currently  Other Topics Concern   Not on file  Social History Narrative   03/16/18: Lives alone with dog, Ronney Asters, apartment. Wife of 16 years left him about 2 years ago, and pt. has had difficulty adjusting.   Says his brother Francee Piccolo, who lives in [place], is his main contact, source of support, as well as his dog.    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 States he uses painting as additional therapy.    Social Determinants of Health   Financial Resource Strain: Medium Risk (04/06/2021)   Overall Financial Resource Strain (CARDIA)    Difficulty of Paying Living Expenses: Somewhat hard  Food Insecurity: Food Insecurity Present (04/06/2021)   Hunger Vital Sign    Worried About Running Out of Food in the Last Year: Often true    Ran Out of Food in the Last Year: Sometimes true  Transportation Needs: No Transportation Needs (11/30/2021)   PRAPARE - Hydrologist (Medical): No    Lack of Transportation (Non-Medical): No  Physical Activity: Sufficiently Active (04/06/2021)   Exercise Vital Sign    Days of Exercise per Week: 3 days    Minutes of Exercise per Session: 120 min  Stress: No Stress Concern Present (04/06/2021)   [place]    Feeling of Stress : Not at all  Social Connections: Moderately Integrated (04/06/2021)   Social Connection and Isolation Panel [NHANES]    Frequency of Communication with Friends and Family: More than three times a week    Frequency of Social Gatherings with Friends and Family: More than three times a week    Attends Religious Services: More than 4 times per year    Active Member of [employer] or Organizations: Yes    Attends Archivist Meetings: More than 4 times per year    Marital Status: Divorced  Intimate Partner Violence: Unknown (04/06/2021)   Humiliation, Afraid, Rape, and Kick questionnaire    Fear of Current or Ex-Partner: No    Emotionally Abused: No    Physically Abused: Not on file    Sexually Abused: No   PHYSICAL EXAM  Vitals:   05/08/22 0849  BP: 135/80  Pulse: (!) 52  Weight: 264 lb (  119.7 kg)  Height: '6\' 2"'$  (1.88 m)   Body mass index is 33.9 kg/m.  Generalized: Well developed, in no acute  distress, well appearing Neurological examination  Mentation: Alert oriented to time, place, history taking. Follows all commands speech and language fluent Cranial nerve II-XII: Pupils were equal round reactive to light. Extraocular movements were full, visual field were full on confrontational test. Facial sensation and strength were normal.  Head turning and shoulder shrug  were normal and symmetric. Motor: The motor testing reveals 5 over 5 strength of all 4 extremities. Good symmetric motor tone is noted throughout.  Sensory: Sensory testing is intact to soft touch on all 4 extremities. No evidence of extinction is noted.  Coordination: Cerebellar testing reveals good finger-nose-finger and heel-to-shin bilaterally.  Gait and station: normal and independent  Reflexes: Deep tendon reflexes are symmetric and normal bilaterally.   DIAGNOSTIC DATA (LABS, IMAGING, TESTING) - I reviewed patient records, labs, notes, testing and imaging myself where available.  Lab Results  Component Value Date   WBC 7.8 12/13/2021   HGB 14.6 12/13/2021   HCT 42.0 12/13/2021   MCV 92.3 12/13/2021   PLT 172 12/13/2021      Component Value Date/Time   NA 138 12/13/2021 0902   NA 141 07/24/2021 1031   K 3.8 12/13/2021 0902   CL 110 12/13/2021 0902   CO2 20 (L) 12/13/2021 0902   GLUCOSE 114 (H) 12/13/2021 0902   BUN 28 (H) 12/13/2021 0902   BUN 22 07/24/2021 1031   CREATININE 1.20 12/13/2021 0902   CALCIUM 9.0 12/13/2021 0902   PROT 6.8 02/05/2022 0806   PROT 7.0 01/19/2014 1041   ALBUMIN 4.1 02/05/2022 0806   ALBUMIN 4.7 01/19/2014 1041   AST 20 02/05/2022 0806   ALT 20 02/05/2022 0806   ALKPHOS 68 02/05/2022 0806   BILITOT 0.4 02/05/2022 0806   GFRNONAA >60 12/13/2021 0902   GFRAA 90 04/19/2019 1526   Lab Results  Component Value Date   CHOL 115 02/05/2022   HDL 26.50 (L) 02/05/2022   LDLCALC 58 02/05/2022   LDLDIRECT 70.0 07/07/2018   TRIG 150.0 (H) 02/05/2022   CHOLHDL 4 02/05/2022    Lab Results  Component Value Date   HGBA1C 5.2 02/05/2022   Lab Results  Component Value Date   VITAMINB12 518 10/16/2016   Lab Results  Component Value Date   TSH 3.04 02/09/2020   ASSESSMENT AND PLAN 57 y.o. year old male  has a past medical history of Arthritis, Baker's cyst, Cancer ([place]), Dysrhythmia, Headache(784.0), HLD (hyperlipidemia), Knee pain, Low back pain, Migraines, OSA (obstructive sleep apnea) (04/10/2017), Personality disorder ([place]), and Seizures ([place]). here with:  1.  Chronic migraine headaches 2.  Seizures, well controlled 3.  Restless leg symptoms   -Currently doing well, headaches under excellent control  -Continue Aimovig 140 mg for migraine prevention  -Continue Topamax for seizure prevention/migraine prevention  -Continue Imitrex injection as needed for acute headache -Continue Requip 0.5 mg at bedtime for RLS -Follow up in 1 year or sooner if needed  Butler Denmark, AGNP-C, DNP 05/08/2022, 9:08 AM [facility] [address], [place], [place] [address] [phone]

## 2022-05-08 ENCOUNTER — Ambulatory Visit (INDEPENDENT_AMBULATORY_CARE_PROVIDER_SITE_OTHER): Payer: 59 | Admitting: Neurology

## 2022-05-08 VITALS — BP 135/80 | HR 52 | Ht 74.0 in | Wt 264.0 lb

## 2022-05-08 DIAGNOSIS — G2581 Restless legs syndrome: Secondary | ICD-10-CM | POA: Diagnosis not present

## 2022-05-08 DIAGNOSIS — R569 Unspecified convulsions: Secondary | ICD-10-CM | POA: Diagnosis not present

## 2022-05-08 DIAGNOSIS — G43009 Migraine without aura, not intractable, without status migrainosus: Secondary | ICD-10-CM

## 2022-05-08 MED ORDER — TOPIRAMATE 50 MG PO TABS: ORAL_TABLET | ORAL | 3 refills | Status: DC

## 2022-05-08 MED ORDER — ROPINIROLE HCL 0.5 MG PO TABS: 0.5000 mg | ORAL_TABLET | Freq: Every day | ORAL | 3 refills | Status: DC

## 2022-05-08 MED ORDER — AIMOVIG 140 MG/ML ~~LOC~~ SOAJ: 140.0000 mg | SUBCUTANEOUS | 3 refills | Status: DC

## 2022-05-08 NOTE — Patient Instructions (Signed)
Continue current medications, refills were sent in  See you back in 1 year

## 2022-05-15 ENCOUNTER — Telehealth: Payer: Self-pay | Admitting: Neurology

## 2022-05-15 ENCOUNTER — Telehealth: Payer: Self-pay

## 2022-05-15 NOTE — Progress Notes (Signed)
Patient ID: Steven Vincent, male   DOB: 03/27/1965, 57 y.o.   MRN: [id number]  Care Management & Coordination Services Pharmacy Team  Reason for Encounter: General adherence update   Contacted patient for general health update and medication adherence call.  Spoke with patient on 05/16/2022    What concerns do you have about your medications?  The patient denies side effects with their medications.   How often do you forget or accidentally miss a dose? Rarely  Do you use a pillbox? No  Are you having any problems getting your medications from your pharmacy? No he reports he is waiting on a delivery that was just ordered for his Aimovig  Has the cost of your medications been a concern? No  The patient has not had an ED visit since last contact.   The patient reports the following problems with their health. Patient reports that he has seen his orthopedist about his right elbow and was prescribed meloxicam that has not helped he reports he still has some pan and irritation when lifting things and notices it often as it is his dominant hand, is open to an appointment or referral for this issue.   Patient denies concerns or questions for Theo Dills, PharmD at this time. Follow up accepted for October  Counseled patient on: Great job taking medications   Chart Updates:  Recent office visits:  04/09/22 Lucretia Kern, DO - Patient presented for [facility] exam. Stopped Asprin. Stopped Vit D. Stopped Cordicidin.  02/05/22 Martinique, Betty G, MD - Patient presented for Routine General Medical exam at a health care facility. No medication changes.   03/15/22 Laurey Morale, MD - Patient presented for Tinea pedis of left foot. Prescribed Ketoconazole.   Recent consult visits:  05/08/22 Suzzanne Cloud, NP (Neurology) - Patient presented for Migraine without aura and without. No medication changes.   04/01/22 [name], MD (Pulmonary Disease) - Patient presented for OSA. No  medication changes.   01/11/22 Melrose Nakayama (Ortho) - Claims encounter for Other specified postprocedural states. No other visit details available.   01/03/22 Minus Breeding, MD (Cariology) - Patient presented for PVC and other concerns. No medication changes.   01/01/22  [name], MD - Patient presented for OSA- No medication changes.   Hospital visits:  Medication Reconciliation was completed by comparing discharge summary, patient's EMR and Pharmacy list, and upon discussion with patient.  Patient presented to [facility] on 12/25/21 due to Primary osteoarthritis of right knee. Patient was present for 10 hours.  New?Medications Started at [facility] Discharge:?? -started  aspirin EC HYDROcodone-acetaminophen (NORCO/VICODIN) tiZANidine  Medication Changes at Hospital Discharge: -Changed  none  Medications Discontinued at Hospital Discharge: -Stopped  none  Medications that remain the same after Hospital Discharge:??  -All other medications will remain the same.    Medications: Outpatient Encounter Medications as of 05/15/2022  Medication Sig   diclofenac (FLECTOR) 1.3 % PTCH Place 1 patch onto the skin daily as needed (pain).   diclofenac Sodium (VOLTAREN) 1 % GEL Apply 1 application. topically 4 (four) times daily as needed (pain).   dronedarone (MULTAQ) 400 MG tablet Take 1 tablet (400 mg total) by mouth 2 (two) times daily with a meal.   Erenumab-aooe (AIMOVIG) 140 MG/ML SOAJ Inject 140 mg into the skin every 30 (thirty) days.   HYDROcodone-acetaminophen (NORCO/VICODIN) 5-325 MG tablet Take 1-2 tablets by mouth every 6 (six) hours as needed for moderate pain or severe pain (post op pain).  ketoconazole (NIZORAL) 2 % cream Apply 1 Application topically daily.   Melatonin 5 MG CAPS Take 5 mg by mouth at bedtime.   nitroGLYCERIN (NITROSTAT) 0.4 MG SL tablet Place 1 tablet (0.4 mg total) under the tongue every 5 (five) minutes x 3 doses as needed for chest pain  (If no relief after 3rd dose, go to ER.).   pantoprazole (PROTONIX) 20 MG tablet Take 1 tablet (20 mg total) by mouth daily before breakfast.   pravastatin (PRAVACHOL) 20 MG tablet Take 1 tablet (20 mg total) by mouth daily.   rOPINIRole (REQUIP) 0.5 MG tablet Take 1 tablet (0.5 mg total) by mouth at bedtime.   SUMAtriptan 6 MG/0.5ML SOAJ INJECT 6 MG INTO THE SKIN DAILY AS NEEDED AT MIGRAINE ONSET. MAY REPEAT 1 TIME AFTER 1 HOUR IF NEEDED   tiZANidine (ZANAFLEX) 4 MG tablet Take 1 tablet (4 mg total) by mouth every 6 (six) hours as needed for muscle spasms.   topiramate (TOPAMAX) 50 MG tablet Take one tablet in the AM and two tablets in the PM.   triamcinolone cream (KENALOG) 0.1 % Apply 1 Application topically 2 (two) times daily as needed (rash).   No facility-administered encounter medications on file as of 05/15/2022.    Recent vitals BP Readings from Last 3 Encounters:  05/08/22 135/80  04/01/22 126/80  03/15/22 138/84   Pulse Readings from Last 3 Encounters:  05/08/22 (!) 52  04/01/22 76  03/15/22 66   Wt Readings from Last 3 Encounters:  05/08/22 264 lb (119.7 kg)  04/01/22 265 lb (120.2 kg)  03/15/22 261 lb (118.4 kg)   BMI Readings from Last 3 Encounters:  05/08/22 33.90 kg/m  04/01/22 34.02 kg/m  03/15/22 33.51 kg/m    Recent lab results    Component Value Date/Time   NA 138 12/13/2021 0902   NA 141 07/24/2021 1031   K 3.8 12/13/2021 0902   CL 110 12/13/2021 0902   CO2 20 (L) 12/13/2021 0902   GLUCOSE 114 (H) 12/13/2021 0902   BUN 28 (H) 12/13/2021 0902   BUN 22 07/24/2021 1031   CREATININE 1.20 12/13/2021 0902   CALCIUM 9.0 12/13/2021 0902    Lab Results  Component Value Date   CREATININE 1.20 12/13/2021   GFR 86.34 11/19/2021   EGFR 86 07/24/2021   GFRNONAA >60 12/13/2021   GFRAA 90 04/19/2019   Lab Results  Component Value Date/Time   HGBA1C 5.2 02/05/2022 08:06 AM   HGBA1C 5.3 08/04/2020 09:53 AM    Lab Results  Component Value Date    CHOL 115 02/05/2022   HDL 26.50 (L) 02/05/2022   LDLCALC 58 02/05/2022   LDLDIRECT 70.0 07/07/2018   TRIG 150.0 (H) 02/05/2022   CHOLHDL 4 02/05/2022    Care Gaps: AWV - 04/09/22  Zoster Vaccine - Overdue Flu Vaccine - Postponed HIV Screen - Postponed COVID Booster - Postponed  Star Rating Drugs:  Pravastatin 20 mg - Last filled 04/11/22 100 DS at [facility] Pharmacist Assistant [phone]

## 2022-05-15 NOTE — Telephone Encounter (Signed)
Pt states that the pharmacy never sent him his  Erenumab-aooe (AIMOVIG) 140 MG/ML SOAJ Pt would like for his Rx to be recent to them. Please advise.

## 2022-05-16 ENCOUNTER — Other Ambulatory Visit: Payer: Self-pay

## 2022-05-16 MED ORDER — AIMOVIG 140 MG/ML ~~LOC~~ SOAJ: 140.0000 mg | SUBCUTANEOUS | 3 refills | Status: DC

## 2022-05-16 NOTE — Telephone Encounter (Signed)
Called pt back. Pt informed me that his insurance has changed and gave me the number off the back of the card to call to find out who his mail pharmacy is. I called [phone] and founded out that pt medication should be sent to [facility]. Pt said it's our responsible to find out what mail pharmacy he used. Stated I told the girl's upfront my insurance changed and they needed to verify my stuff. I let pt know his mail in pharmacy is [facility] mail in [facility] and I would get nurse to send prescription to pharmacy.

## 2022-05-16 NOTE — Telephone Encounter (Signed)
Called pt. Informed him of message nurse Katie sent.  He should contact [facility] as they received the prescription on 03.13.24. Pt said he called [facility] this evening and was told to please call you doctor. Pt is requesting a nurse call [place] and get things sorted out. Pt is requesting a call back once the nurse call [facility]

## 2022-05-23 ENCOUNTER — Telehealth: Payer: Self-pay | Admitting: Neurology

## 2022-05-23 ENCOUNTER — Other Ambulatory Visit: Payer: Self-pay | Admitting: Cardiovascular Disease

## 2022-05-23 NOTE — Telephone Encounter (Signed)
Pt called. Stated he needs a nurse to reach out to his insurance about Erenumab-aooe (AIMOVIG) 140 MG/ML SOAJ. Stated he was told it needed to be authorized by his doctor. Pt asked that nurse please call [phone].

## 2022-05-27 ENCOUNTER — Other Ambulatory Visit (HOSPITAL_COMMUNITY): Payer: Self-pay

## 2022-05-27 NOTE — Telephone Encounter (Signed)
Pharmacy Patient Advocate Encounter   Received notification from [facility] that prior authorization for Aimovig 140MG /ML auto-injectors is required/requested.   PA submitted on 05/27/2022 to (ins) [facility] via CoverMyMeds Key or (Medicaid) confirmation # [id number] Status is pending

## 2022-05-28 NOTE — Telephone Encounter (Signed)
I called the number listed to get more information about the appeal, at this point we will have to write an appeal letter.  I also called the patient to confirm.  Prior to starting Aimovig in 2020 he was having 10 migraine headaches, with daily headache.  Since being on Aimovig, he now has about 2 migraines a month.  This is excellent improvement.  His headaches have improved as result of the Aimovig.

## 2022-05-28 NOTE — Telephone Encounter (Signed)
Patient does not meet criteria. Would you like to appeal or try an alternative?  Other options on the formulary are:

## 2022-05-28 NOTE — Telephone Encounter (Signed)
Pharmacy Patient Advocate Encounter  Received notification from [facility] that the request for prior authorization for Aimovig 140MG /ML auto-injectors has been denied due to See below.       Please be advised we currently do not have a Pharmacist to review denials, therefore you will need to process appeals accordingly as needed. Thanks for your support at this time.   You may call  or fax , to appeal.

## 2022-05-29 NOTE — Telephone Encounter (Signed)
Letter created and will be placed on NP desk for review and signature.

## 2022-06-04 NOTE — Telephone Encounter (Signed)
Appeal letter faxed to appeal department

## 2022-06-05 NOTE — Telephone Encounter (Signed)
I called patient. I advised him that the appeal for aimovig was approved. I encouraged him to call [facility] to check on the status of his aimovig RX and advise them of the approval if they weren't already aware. Pt verbalized understanding.

## 2022-06-05 NOTE — Telephone Encounter (Signed)
Renea Ee from [facility] called with a start date for the Aimovig 05/27/22-02/25/23 Appeal number [id number]

## 2022-06-06 ENCOUNTER — Telehealth: Payer: Self-pay | Admitting: Pulmonary Disease

## 2022-06-06 NOTE — Telephone Encounter (Signed)
Pt c/o wheezing, coughing & being tired since the beginning of the month.  Pharmacy is [facility] on [address].

## 2022-06-06 NOTE — Progress Notes (Unsigned)
ACUTE VISIT Chief Complaint  Patient presents with   bump on ear    Behind ear    HPI: [name] is a 57 y.o. male, who is here today complaining of *** HPI  Review of Systems  Constitutional:  Positive for fatigue.  Musculoskeletal:  Positive for arthralgias and myalgias.  See other pertinent positives and negatives in HPI.  Current Outpatient Medications on File Prior to Visit  Medication Sig Dispense Refill   diclofenac (FLECTOR) 1.3 % PTCH Place 1 patch onto the skin daily as needed (pain).     diclofenac Sodium (VOLTAREN) 1 % GEL Apply 1 application. topically 4 (four) times daily as needed (pain).     dronedarone (MULTAQ) 400 MG tablet Take 1 tablet (400 mg total) by mouth 2 (two) times daily with a meal. 180 tablet 1   Erenumab-aooe (AIMOVIG) 140 MG/ML SOAJ Inject 140 mg into the skin every 30 (thirty) days. 3 mL 3   HYDROcodone-acetaminophen (NORCO/VICODIN) 5-325 MG tablet Take 1-2 tablets by mouth every 6 (six) hours as needed for moderate pain or severe pain (post op pain). 40 tablet 0   ketoconazole (NIZORAL) 2 % cream Apply 1 Application topically daily. 30 g 0   Melatonin 5 MG CAPS Take 5 mg by mouth at bedtime.     nitroGLYCERIN (NITROSTAT) 0.4 MG SL tablet Place 1 tablet (0.4 mg total) under the tongue every 5 (five) minutes x 3 doses as needed for chest pain (If no relief after 3rd dose, go to ER.). 25 tablet 1   pantoprazole (PROTONIX) 20 MG tablet Take 1 tablet (20 mg total) by mouth daily before breakfast. 90 tablet 1   pravastatin (PRAVACHOL) 20 MG tablet Take 1 tablet (20 mg total) by mouth daily. 90 tablet 1   rOPINIRole (REQUIP) 0.5 MG tablet Take 1 tablet (0.5 mg total) by mouth at bedtime. 90 tablet 3   SUMAtriptan 6 MG/0.5ML SOAJ INJECT 6 MG INTO THE SKIN DAILY AS NEEDED AT MIGRAINE ONSET. MAY REPEAT 1 TIME AFTER 1 HOUR IF NEEDED 6 mL 11   tiZANidine (ZANAFLEX) 4 MG tablet Take 1 tablet (4 mg total) by mouth every 6 (six) hours as needed for  muscle spasms. 40 tablet 1   topiramate (TOPAMAX) 50 MG tablet Take one tablet in the AM and two tablets in the PM. 270 tablet 3   triamcinolone cream (KENALOG) 0.1 % Apply 1 Application topically 2 (two) times daily as needed (rash).     No current facility-administered medications on file prior to visit.    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   Dysrhythmia    bradycardia   Headache(784.0)    HLD (hyperlipidemia)    Knee pain    Low back pain    Migraines    OSA (obstructive sleep apnea) 04/10/2017   Personality disorder    Seizures    Allergies  Allergen Reactions   Delsym [Dextromethorphan] Other (See Comments)    Gets loopy/memory loss   Aspirin Nausea And Vomiting    GI upset    Mucinex D [Pseudoephedrine-Guaifenesin Er] Hypertension   Ibuprofen Other (See Comments)    Stomach irritation    Social History   Socioeconomic History   Marital status: Divorced    Spouse name: Not on file   Number of children: 0   Years of education: Not on file   Highest  education level: Not on file  Occupational History   Occupation: disabled  Tobacco Use   Smoking status: Former    Packs/day: 1.00    Years: 1.00    Additional pack years: 0.00    Total pack years: 1.00    Types: Cigarettes    Quit date: 72    Years since quitting: 32.3    Passive exposure: Past   Smokeless tobacco: Never   Tobacco comments:    Pt stopped smoking in the early 90's  Vaping Use   Vaping Use: Never used  Substance and Sexual Activity   Alcohol use: No   Drug use: No   Sexual activity: Not Currently  Other Topics Concern   Not on file  Social History Narrative   03/16/18: Lives alone with dog, Steven Vincent, apartment. Wife of 16 years left him about 2 years ago, and pt. has had difficulty adjusting.   Says his brother Steven Vincent, who lives in [place], is his main contact, source of support, as well as his dog.    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 States he uses painting as additional therapy.    Social Determinants of Health   Financial Resource Strain: Medium Risk (04/06/2021)   Overall Financial Resource Strain (CARDIA)    Difficulty of Paying Living Expenses: Somewhat hard  Food Insecurity: Food Insecurity Present (04/06/2021)   Hunger Vital Sign    Worried About Running Out of Food in the Last Year: Often true    Ran Out of Food in the Last Year: Sometimes true  Transportation Needs: No Transportation Needs (11/30/2021)   PRAPARE - Administrator, Civil Service (Medical): No    Lack of Transportation (Non-Medical): No  Physical Activity: Sufficiently Active (04/06/2021)   Exercise Vital Sign    Days of Exercise per Week: 3 days    Minutes of Exercise per Session: 120 min  Stress: No Stress Concern Present (04/06/2021)   [employer] of Occupational Health - Occupational Stress Questionnaire    Feeling of Stress : Not at all  Social Connections: Moderately Integrated (04/06/2021)   Social Connection and Isolation Panel [NHANES]    Frequency of Communication with Friends and Family: More than three times a week    Frequency of Social Gatherings with Friends and Family: More than three times a week    Attends Religious Services: More than 4 times per year    Active Member of [employer] or Organizations: Yes    Attends Banker Meetings: More than 4 times per year    Marital Status: Divorced   Vitals:   06/07/22 1427  BP: 126/80  Pulse: 73  Resp: 16  SpO2: 97%   Body mass index is 34.18 kg/m.  Physical Exam Vitals and nursing note reviewed.  Constitutional:      General: He is not in acute distress.    Appearance: He is well-developed.  HENT:     Head: Normocephalic and atraumatic.     Ears:      Comments: Behind right ear auricle , perle like, *** 2-3 mm Eyes:      Conjunctiva/sclera: Conjunctivae normal.  Neck:     Vascular: No JVD.  Cardiovascular:     Rate and Rhythm: Normal rate and regular rhythm.     Pulses:          Dorsalis pedis pulses are 2+ on the right side and  2+ on the left side.     Heart sounds: No murmur heard.    Comments: Trace pitting LE edema, bilateral. Pulmonary:     Effort: Pulmonary effort is normal. Prolonged expiration present. No respiratory distress.     Breath sounds: Wheezing present. No rhonchi or rales.  Abdominal:     Palpations: Abdomen is soft. There is no hepatomegaly or mass.     Tenderness: There is no abdominal tenderness.  Lymphadenopathy:     Cervical: No cervical adenopathy.  Skin:    General: Skin is warm.     Findings: No erythema or rash.  Neurological:     Mental Status: He is alert and oriented to person, place, and time.     Cranial Nerves: No cranial nerve deficit.     Gait: Gait normal.  Psychiatric:     Comments: Well groomed, good eye contact.     ASSESSMENT AND PLAN: Wheezing -     DG Chest 2 View; Future -     predniSONE; Take 2 tablets (40 mg total) by mouth daily with breakfast for 3 days.  Dispense: 6 tablet; Refill: 0 -     Albuterol Sulfate HFA; Inhale 2 puffs into the lungs every 6 (six) hours as needed for wheezing or shortness of breath.  Dispense: 8 g; Refill: 0  Lesion of right external ear -     Ambulatory referral to Dermatology  Hx of nonmelanoma skin cancer -     Ambulatory referral to Dermatology  Fell asleep on exam table *** No follow-ups on file.  Taryll Reichenberger G. [place]az[place], MD  [facility]. [place] office.

## 2022-06-07 ENCOUNTER — Encounter: Payer: Self-pay | Admitting: Family Medicine

## 2022-06-07 ENCOUNTER — Ambulatory Visit (INDEPENDENT_AMBULATORY_CARE_PROVIDER_SITE_OTHER): Payer: 59 | Admitting: Family Medicine

## 2022-06-07 ENCOUNTER — Ambulatory Visit (INDEPENDENT_AMBULATORY_CARE_PROVIDER_SITE_OTHER): Payer: 59

## 2022-06-07 VITALS — BP 126/80 | HR 73 | Resp 16 | Ht 74.0 in | Wt 266.2 lb

## 2022-06-07 DIAGNOSIS — G4733 Obstructive sleep apnea (adult) (pediatric): Secondary | ICD-10-CM

## 2022-06-07 DIAGNOSIS — R42 Dizziness and giddiness: Secondary | ICD-10-CM

## 2022-06-07 DIAGNOSIS — R062 Wheezing: Secondary | ICD-10-CM | POA: Diagnosis not present

## 2022-06-07 DIAGNOSIS — H6191 Disorder of right external ear, unspecified: Secondary | ICD-10-CM | POA: Diagnosis not present

## 2022-06-07 DIAGNOSIS — R059 Cough, unspecified: Secondary | ICD-10-CM | POA: Diagnosis not present

## 2022-06-07 DIAGNOSIS — Z85828 Personal history of other malignant neoplasm of skin: Secondary | ICD-10-CM

## 2022-06-07 MED ORDER — ALBUTEROL SULFATE HFA 108 (90 BASE) MCG/ACT IN AERS: 2.0000 | INHALATION_SPRAY | Freq: Four times a day (QID) | RESPIRATORY_TRACT | 0 refills | Status: DC | PRN

## 2022-06-07 MED ORDER — PREDNISONE 20 MG PO TABS: 40.0000 mg | ORAL_TABLET | Freq: Every day | ORAL | 0 refills | Status: AC

## 2022-06-07 NOTE — Patient Instructions (Signed)
A few things to remember from today's visit:  Wheezing - Plan: DG Chest 2 View, predniSONE (DELTASONE) 20 MG tablet, albuterol (VENTOLIN HFA) 108 (90 Base) MCG/ACT inhaler  Lesion of right external ear - Plan: Ambulatory referral to Dermatology  Hx of nonmelanoma skin cancer - Plan: Ambulatory referral to Dermatology Albuterol inh 2 puff every 6 hours for a week then as needed for wheezing or shortness of breath.  Prednisone with breakfast for 3 days. Keep appt with Dr Craige Cotta.  If you need refills for medications you take chronically, please call your pharmacy. Do not use My Chart to request refills or for acute issues that need immediate attention. If you send a my chart message, it may take a few days to be addressed, specially if I am not in the office.  Please be sure medication list is accurate. If a new problem present, please set up appointment sooner than planned today.

## 2022-06-07 NOTE — Telephone Encounter (Signed)
Called and spoke with pt and have scheduled him for an OV with VS Monday, 4/15. Nothing further needed.

## 2022-06-08 DIAGNOSIS — M7671 Peroneal tendinitis, right leg: Secondary | ICD-10-CM | POA: Diagnosis not present

## 2022-06-10 ENCOUNTER — Ambulatory Visit (INDEPENDENT_AMBULATORY_CARE_PROVIDER_SITE_OTHER): Payer: 59 | Admitting: Pulmonary Disease

## 2022-06-10 ENCOUNTER — Other Ambulatory Visit: Payer: Self-pay | Admitting: Cardiovascular Disease

## 2022-06-10 ENCOUNTER — Encounter (HOSPITAL_BASED_OUTPATIENT_CLINIC_OR_DEPARTMENT_OTHER): Payer: Self-pay | Admitting: Pulmonary Disease

## 2022-06-10 VITALS — BP 128/82 | HR 68 | Temp 98.1°F | Ht 74.0 in | Wt 269.4 lb

## 2022-06-10 DIAGNOSIS — G4733 Obstructive sleep apnea (adult) (pediatric): Secondary | ICD-10-CM

## 2022-06-10 NOTE — Patient Instructions (Signed)
Will arrange for a home sleep study  Follow up in 6 months

## 2022-06-10 NOTE — Progress Notes (Signed)
[facility]  Chief Complaint  Patient presents with   Follow-up    Follow up. Patient has no complaints.     Constitutional:  BP 128/82 (BP Location: Left Arm, Patient Position: Sitting, Cuff Size: Normal)   Pulse 68   Temp 98.1 F (36.7 C) (Oral)   Ht  (1.88 m)   Wt 269 lb 6.4 oz (122.2 kg)   SpO2 96%   BMI 34.59 kg/m   Past Medical History:  HA, Low back pain, Migraine HA, Seizures, Personality disorder  Past Surgical History:  His  has a past surgical history that includes Tooth extraction (12/27/2014); Total knee arthroplasty (Left, 11/28/2020); Drug induced endoscopy (N/A, 03/07/2021); Colonoscopy; Implantation of hypoglossal nerve stimulator (Right, 05/16/2021); and Patella-femoral arthroplasty (Right, 12/25/2021).  Brief Summary:  Steven Vincent is a 57 y.o. male former smoker with obstructive sleep apnea.  Intolerant of CPAP and had Inspire device implanted in May 2023.      Subjective:   No issues from using Inspire.  His device won't download.  Not having neck pain, dysphagia, change in voice.  Had trouble with sinus congestion and cough last week.  Saw his PCP.  Treated with prednisone and feels better.  He is trying to exercise more and lose weight.  Physical Exam:   Appearance - well kempt   ENMT - no sinus tenderness, no oral exudate, no LAN, Mallampati 3 airway, no stridor, normal tongue movement  Respiratory - equal breath sounds bilaterally, no wheezing or rales  CV - s1s2 regular rate and rhythm, no murmurs  Ext - no clubbing, no edema  Skin - no rashes  Psych - normal mood and affect      Sleep Tests:  PSG 02/15/17 >> AHI 6.9, SpO2 low 85%.  Supine AHI 10.5, REM AHI 24.3. HST 02/26/18 >> AHI 11.6, SpO2 low 79% HST 07/07/18 >> AHI 7.7, SpO2 low 83% PSG 08/15/21 >> optimal level 0.9 V  Cardiac Tests:  Echo 04/28/19 >> EF 60 to 65%, grade 1 DD, ascending aorta 40 mm  Social History:   He  reports that he quit smoking about 32 years ago. His smoking use included cigarettes. He has a 1.00 pack-year smoking history. He has been exposed to tobacco smoke. He has never used smokeless tobacco. He reports that he does not drink alcohol and does not use drugs.  Family History:  His family history includes Alzheimer's disease in his father; Aneurysm in his mother; Heart disease in his father; Hyperlipidemia in his father; Hypertension in his brother and father; Other in his father; Stroke in his brother.     Assessment/Plan:   Obstructive sleep apnea. - intolerant of CPAP - had Inspire device implanted in May 2023 - amplitude 0.9 V with patient control range 0.7 to 1.1 V - will arrange for a home sleep study to monitor therapy  Time Spent Involved in Patient Care on Day of Examination:  26 minutes  Follow up:   Patient Instructions  Will arrange for a home sleep study  Follow up in 6 months  Medication List:   Allergies as of 06/10/2022       Reactions   Delsym [dextromethorphan] Other (See Comments)   Gets loopy/memory loss   Aspirin Nausea And Vomiting   GI upset   Mucinex D [pseudoephedrine-guaifenesin Er] Hypertension   Ibuprofen Other (See Comments)   Stomach irritation        Medication List  Accurate as of June 10, 2022  3:09 PM. If you have any questions, ask your nurse or doctor.          Aimovig 140 MG/ML Soaj Generic drug: Erenumab-aooe Inject 140 mg into the skin every 30 (thirty) days.   albuterol 108 (90 Base) MCG/ACT inhaler Commonly known as: VENTOLIN HFA Inhale 2 puffs into the lungs every 6 (six) hours as needed for wheezing or shortness of breath.   diclofenac 1.3 % Ptch Commonly known as: FLECTOR Place 1 patch onto the skin daily as needed (pain).   diclofenac Sodium 1 % Gel Commonly known as: VOLTAREN Apply 1 application. topically 4 (four) times daily as needed (pain).   HYDROcodone-acetaminophen 5-325 MG  tablet Commonly known as: NORCO/VICODIN Take 1-2 tablets by mouth every 6 (six) hours as needed for moderate pain or severe pain (post op pain).   ketoconazole 2 % cream Commonly known as: NIZORAL Apply 1 Application topically daily.   Melatonin 5 MG Caps Take 5 mg by mouth at bedtime.   Multaq 400 MG tablet Generic drug: dronedarone Take 1 tablet (400 mg total) by mouth 2 (two) times daily with a meal.   nitroGLYCERIN 0.4 MG SL tablet Commonly known as: NITROSTAT Place 1 tablet (0.4 mg total) under the tongue every 5 (five) minutes x 3 doses as needed for chest pain (If no relief after 3rd dose, go to ER.).   pantoprazole 20 MG tablet Commonly known as: Protonix Take 1 tablet (20 mg total) by mouth daily before breakfast.   pravastatin 20 MG tablet Commonly known as: PRAVACHOL Take 1 tablet (20 mg total) by mouth daily.   predniSONE 20 MG tablet Commonly known as: DELTASONE Take 2 tablets (40 mg total) by mouth daily with breakfast for 3 days.   rOPINIRole 0.5 MG tablet Commonly known as: REQUIP Take 1 tablet (0.5 mg total) by mouth at bedtime.   SUMAtriptan 6 MG/0.5ML Soaj INJECT 6 MG INTO THE SKIN DAILY AS NEEDED AT MIGRAINE ONSET. MAY REPEAT 1 TIME AFTER 1 HOUR IF NEEDED   tiZANidine 4 MG tablet Commonly known as: Zanaflex Take 1 tablet (4 mg total) by mouth every 6 (six) hours as needed for muscle spasms.   topiramate 50 MG tablet Commonly known as: TOPAMAX Take one tablet in the AM and two tablets in the PM.   triamcinolone cream 0.1 % Commonly known as: KENALOG Apply 1 Application topically 2 (two) times daily as needed (rash).        Signature:  Coralyn Helling, MD [facility]monary/Critical Care Pager - [phone] 06/10/2022, 3:09 PM

## 2022-06-11 ENCOUNTER — Telehealth: Payer: Self-pay | Admitting: Pulmonary Disease

## 2022-06-11 NOTE — Telephone Encounter (Signed)
PT calling for [facility]) to let her know his inspire is " Not meeting up, not comparing.)  [facility] had asked him to call today. Adv PT we will call him back.

## 2022-06-11 NOTE — Telephone Encounter (Signed)
Steven Vincent is working on this one.

## 2022-06-18 ENCOUNTER — Other Ambulatory Visit: Payer: Self-pay | Admitting: Cardiovascular Disease

## 2022-06-24 ENCOUNTER — Other Ambulatory Visit: Payer: Self-pay

## 2022-06-24 ENCOUNTER — Telehealth: Payer: Self-pay | Admitting: *Deleted

## 2022-06-24 MED ORDER — ROPINIROLE HCL 0.5 MG PO TABS: 0.5000 mg | ORAL_TABLET | Freq: Every day | ORAL | 3 refills | Status: DC

## 2022-06-24 NOTE — Telephone Encounter (Signed)
Pt called stated he needs a refill for ropinirole. Please call [phone]

## 2022-06-25 NOTE — Telephone Encounter (Signed)
I spoke with the tech, Sylva at [facility]. She stated it was processing for shipment and should be shipped by the end of the day. I informed the patient. He verbalized understanding and expressed appreciation for the call.

## 2022-06-25 NOTE — Telephone Encounter (Addendum)
Pt stated the pharmacy told him he don't have any refills on file.

## 2022-06-27 ENCOUNTER — Encounter: Payer: Self-pay | Admitting: Adult Health

## 2022-06-27 ENCOUNTER — Ambulatory Visit (INDEPENDENT_AMBULATORY_CARE_PROVIDER_SITE_OTHER): Payer: 59 | Admitting: Adult Health

## 2022-06-27 VITALS — BP 128/70 | HR 72 | Temp 98.1°F | Ht 74.0 in | Wt 266.0 lb

## 2022-06-27 DIAGNOSIS — G4733 Obstructive sleep apnea (adult) (pediatric): Secondary | ICD-10-CM

## 2022-06-27 NOTE — Patient Instructions (Signed)
Use Inspire each night , all night long  Home sleep study in 4 weeks .  Remain on current Inspire level.  Follow up in 3 months with Dr. Craige Cotta  and As needed

## 2022-06-27 NOTE — Assessment & Plan Note (Signed)
OSA -s/p Inspire implant 04/2021  Poor compliance  Encouraged on daily usage.  Increase pause time to .  Check HST in 4 weeks .  No changes in amplitude -Amplitude is 0.8 v , Range is 0.7 to 1.1 V .  Plan  Patient Instructions  Use Inspire each night , all night long  Home sleep study in 4 weeks .  Remain on current Inspire level.  Follow up in 3 months with Dr. Craige Cotta  and As needed

## 2022-06-27 NOTE — Progress Notes (Signed)
Reviewed and agree with assessment/plan.   Guila Owensby, MD [facility] 06/27/2022, 10:57 AM Pager:  [phone]  

## 2022-06-27 NOTE — Progress Notes (Signed)
@Patient  ID: Steven Vincent, male    DOB: 1966-02-17, 57 y.o.   MRN: [id number]  Chief Complaint  Patient presents with   Follow-up    Referring provider: [place], Betty G, MD  HPI: 57 yo male followed for OSA and Insomnia (CPAP intolerant) s/p Inspire device implant (05/16/21)  Medical history significant for seizure disorder Patient is a Cytogeneticist, gold Gregor Hams, Anise Salvo is [place].  (Patient does have some intellectual disability reads and writes at a third grade level.  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  Supine AHI 10.5, REM AHI 24.3. HST 02/26/18 >> AHI 11.6, SpO2 low 79% HST 07/07/18 >> AHI 7.7, SpO2 low 83% Auto CPAP 11/20/19 to 12/19/19 >> used on 12 of 30 nights with average 2 hrs 9 min.  Average AHI 4.3 with median CPAP 11 and 95 th percentile CPAP 14 cm H2O CPAP titration 01/31/20 >> CPAP 13 >> AHI 0, no REM.   Cardiac Tests:  Echo 04/28/19 >> EF 60 to 65%, grade 1 DD, ascending aorta 40 mm  06/27/2022 Follow up : OSA s/p Inspire Device  Patient returns for a 2 week follow up. Having trouble with Inspire remote and Phone/APP link .  Inspire team here for visit .  Remote and APP relinked on his phone.  Download shows poor usage.  Says he still takes nap daily . Falls asleep watching TV. Otherwise feels good.  Long discussion regarding usage and compliance .  Will increase pause time to .  Amplitude is 0.8 v , Range is 0.7 to 1.1 V . Tongue movement okay . Waveform good.       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   Dysrhythmia    bradycardia   Headache(784.0)    HLD (hyperlipidemia)    Knee pain    Low back pain    Migraines    OSA (obstructive sleep apnea) 04/10/2017   Personality disorder (HCC)    Seizures (HCC)     Tobacco History: Social History   Tobacco Use  Smoking Status Former   Packs/day: 1.00   Years: 1.00   Additional pack years: 0.00   Total pack years: 1.00   Types: Cigarettes   Quit date: 1992   Years since quitting: 32.3   Passive exposure: Past  Smokeless Tobacco Never  Tobacco Comments   Pt stopped smoking in the early 90's   Counseling given: Not Answered Tobacco comments: Pt stopped smoking in the early 90's   Outpatient Medications Prior to Visit  Medication Sig Dispense Refill   albuterol (VENTOLIN HFA) 108 (90 Base) MCG/ACT inhaler Inhale 2 puffs into the lungs every 6 (six) hours as needed for wheezing or shortness of breath. 8 g 0  diclofenac Sodium (VOLTAREN) 1 % GEL Apply 1 application. topically 4 (four) times daily as needed (pain).     dronedarone (MULTAQ) 400 MG tablet Take 1 tablet (400 mg total) by mouth 2 (two) times daily with a meal. 180 tablet 1   Erenumab-aooe (AIMOVIG) 140 MG/ML SOAJ Inject 140 mg into the skin every 30 (thirty) days. 3 mL 3   HYDROcodone-acetaminophen (NORCO/VICODIN) 5-325 MG tablet Take 1-2 tablets by mouth every 6 (six) hours as needed for moderate pain or severe pain (post op pain). 40 tablet 0   ketoconazole (NIZORAL) 2 % cream Apply 1 Application topically daily. 30 g 0   Melatonin 5 MG CAPS Take 5 mg by mouth at bedtime.     nitroGLYCERIN (NITROSTAT) 0.4 MG SL tablet Place 1 tablet (0.4 mg total) under the tongue every 5 (five) minutes x 3 doses as needed for chest pain  (If no relief after 3rd dose, go to ER.). 25 tablet 1   pantoprazole (PROTONIX) 20 MG tablet Take 1 tablet (20 mg total) by mouth daily before breakfast. 90 tablet 1   pravastatin (PRAVACHOL) 20 MG tablet Take 1 tablet (20 mg total) by mouth daily. 90 tablet 1   rOPINIRole (REQUIP) 0.5 MG tablet Take 1 tablet (0.5 mg total) by mouth at bedtime. 90 tablet 3   SUMAtriptan 6 MG/0.5ML SOAJ INJECT 6 MG INTO THE SKIN DAILY AS NEEDED AT MIGRAINE ONSET. MAY REPEAT 1 TIME AFTER 1 HOUR IF NEEDED 6 mL 11   tiZANidine (ZANAFLEX) 4 MG tablet Take 1 tablet (4 mg total) by mouth every 6 (six) hours as needed for muscle spasms. 40 tablet 1   topiramate (TOPAMAX) 50 MG tablet Take one tablet in the AM and two tablets in the PM. 270 tablet 3   triamcinolone cream (KENALOG) 0.1 % Apply 1 Application topically 2 (two) times daily as needed (rash).     diclofenac (FLECTOR) 1.3 % PTCH Place 1 patch onto the skin daily as needed (pain).     No facility-administered medications prior to visit.     Review of Systems:   Constitutional:   No  weight loss, night sweats,  Fevers, chills, + fatigue, or  lassitude.  HEENT:   No headaches,  Difficulty swallowing,  Tooth/dental problems, or  Sore throat,                No sneezing, itching, ear ache, nasal congestion, post nasal drip,   CV:  No chest pain,  Orthopnea, PND, swelling in lower extremities, anasarca, dizziness, palpitations, syncope.   GI  No heartburn, indigestion, abdominal pain, nausea, vomiting, diarrhea, change in bowel habits, loss of appetite, bloody stools.   Resp: No shortness of breath with exertion or at rest.  No excess mucus, no productive cough,  No non-productive cough,  No coughing up of blood.  No change in color of mucus.  No wheezing.  No chest wall deformity  Skin: no rash or lesions.  GU: no dysuria, change in color of urine, no urgency or frequency.  No flank pain, no hematuria   MS:  No joint pain or swelling.  No decreased range  of motion.  No back pain.    Physical Exam  BP 128/70 (BP Location: Left Arm, Patient Position: Sitting, Cuff Size: Large)   Pulse 72   Temp 98.1 F (36.7 C) (Oral)   Ht 6\' 2"  (1.88 m)   Wt 266 lb (120.7 kg)   SpO2 98% Comment: RA  BMI 34.15  kg/m   GEN: A/Ox3; pleasant , NAD, well nourished    HEENT:  [place]/AT,  NOSE-clear, THROAT-clear, no lesions, no postnasal drip or exudate noted.  Normal tongue movement   NECK:  Supple w/ fair ROM; no JVD; normal carotid impulses w/o bruits; no thyromegaly or nodules palpated; no lymphadenopathy.    RESP  Clear  P & A; w/o, wheezes/ rales/ or rhonchi. no accessory muscle use, no dullness to percussion  CARD:  RRR, no m/r/g, no peripheral edema, pulses intact, no cyanosis or clubbing.  GI:   Soft & nt; nml bowel sounds; no organomegaly or masses detected.   Musco: Warm bil, no deformities or joint swelling noted.   Neuro: alert, no focal deficits noted.    Skin: Warm, no lesions or rashes    Lab Results:  CBC    Component Value Date/Time   WBC 7.8 12/13/2021 0902   RBC 4.55 12/13/2021 0902   HGB 14.6 12/13/2021 0902   HGB 15.0 01/29/2021 1141   HCT 42.0 12/13/2021 0902   HCT 43.5 01/29/2021 1141   PLT 172 12/13/2021 0902   PLT 167 01/29/2021 1141   MCV 92.3 12/13/2021 0902   MCV 93 01/29/2021 1141   MCH 32.1 12/13/2021 0902   MCHC 34.8 12/13/2021 0902   RDW 12.7 12/13/2021 0902   RDW 12.5 01/29/2021 1141   LYMPHSABS 2.5 11/15/2020 1055   LYMPHSABS 2.2 01/19/2014 1041   MONOABS 0.7 11/15/2020 1055   EOSABS 0.4 11/15/2020 1055   EOSABS 0.3 01/19/2014 1041   BASOSABS 0.0 11/15/2020 1055   BASOSABS 0.0 01/19/2014 1041    BMET    Component Value Date/Time   NA 138 12/13/2021 0902   NA 141 07/24/2021 1031   K 3.8 12/13/2021 0902   CL 110 12/13/2021 0902   CO2 20 (L) 12/13/2021 0902   GLUCOSE 114 (H) 12/13/2021 0902   BUN 28 (H) 12/13/2021 0902   BUN 22 07/24/2021 1031   CREATININE 1.20 12/13/2021 0902   CALCIUM  9.0 12/13/2021 0902   GFRNONAA >60 12/13/2021 0902   GFRAA 90 04/19/2019 1526    BNP    Component Value Date/Time   BNP 39.2 01/29/2021 1141    ProBNP No results found for: "PROBNP"  Imaging: DG Chest 2 View  Result Date: 06/11/2022 CLINICAL DATA:  Wheezing and cough EXAM: CHEST - 2 VIEW COMPARISON:  12/13/2021 FINDINGS: The heart size and mediastinal contours are within normal limits. Right chest stimulator control box. Pulmonary hyperinflation. No acute abnormality. Disc degenerative disease of the thoracic spine. IMPRESSION: Pulmonary hyperinflation without acute abnormality of the lungs. No acute airspace opacity. Electronically Signed   By: Jearld Lesch M.D.   On: 06/11/2022 10:08          No data to display          No results found for: "NITRICOXIDE"      Assessment & Plan:   OSA (obstructive sleep apnea) OSA -s/p Inspire implant 04/2021  Poor compliance  Encouraged on daily usage.  Increase pause time to .  Check HST in 4 weeks .  No changes in amplitude -Amplitude is 0.8 v , Range is 0.7 to 1.1 V .  Plan  Patient Instructions  Use Inspire each night , all night long  Home sleep study in 4 weeks .  Remain on current Inspire level.  Follow up in 3 months with Dr. Craige Cotta  and As needed        Rubye Oaks, NP 06/27/2022

## 2022-07-04 ENCOUNTER — Other Ambulatory Visit: Payer: Self-pay | Admitting: Family Medicine

## 2022-07-04 DIAGNOSIS — R062 Wheezing: Secondary | ICD-10-CM

## 2022-07-09 DIAGNOSIS — M25551 Pain in right hip: Secondary | ICD-10-CM | POA: Diagnosis not present

## 2022-08-05 ENCOUNTER — Telehealth: Payer: Self-pay | Admitting: Neurology

## 2022-08-05 ENCOUNTER — Other Ambulatory Visit: Payer: Self-pay

## 2022-08-05 MED ORDER — AIMOVIG 140 MG/ML ~~LOC~~ SOAJ: 140.0000 mg | SUBCUTANEOUS | 3 refills | Status: DC

## 2022-08-05 NOTE — Telephone Encounter (Signed)
Pt states he is needing a refill of his rOPINIRole (REQUIP) 0.5 MG tablet and  Erenumab-aooe (AIMOVIG) 140 MG/ML SOAJ.  At [facility]

## 2022-08-07 NOTE — Progress Notes (Signed)
Cardiology Clinic Note   Patient Name: Steven Vincent Date of Encounter: 08/16/2022  Primary Care Provider:  [place], Betty G, MD Primary Cardiologist:  Rollene Rotunda, MD  Patient Profile    57 year old male with history of PVCs and bradycardia, with Holter monitor in December 2019 revealing frequent PVCs with a burden of 26%, and a 4 beat run of NSVT.  He was treated with Multaq with improvement in symptoms.  Low risk stress Myoview in 2022, low calcium score per cardiac CTA in 2023.  History of OSA with Inspire device implanted for sleep apnea.  Last seen by Dr. Antoine Poche on 01/03/2022.  At that time he complained of a "electric shock" which she thought might have been coming from his OSA device.  This was checked by sleep medicine and found to be working normally.  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   Dysrhythmia    bradycardia   Headache(784.0)    HLD (hyperlipidemia)    Knee pain    Low back pain    Migraines    OSA (obstructive sleep apnea) 04/10/2017   Personality disorder (HCC)    Seizures (HCC)    Past Surgical History:  Procedure Laterality Date   COLONOSCOPY     normal   DRUG INDUCED ENDOSCOPY N/A 03/07/2021   Procedure: DRUG INDUCED SLEEP ENDOSCOPY;  Surgeon: Christia Reading, MD;  Location: [facility];  Service: ENT;  Laterality: N/A;   IMPLANTATION OF HYPOGLOSSAL NERVE STIMULATOR Right 05/16/2021   Procedure: IMPLANTATION OF HYPOGLOSSAL NERVE STIMULATOR;  Surgeon: Christia Reading, MD;  Location: [facility] OR;  Service: ENT;  Laterality: Right;   PATELLA-FEMORAL ARTHROPLASTY Right 12/25/2021   Procedure: RIGHT PATELLOFEMORAL REPLACEMENT;  Surgeon: Marcene Corning, MD;  Location: [facility];  Service: Orthopedics;  Laterality: Right;   TOOTH EXTRACTION  12/27/2014   lower left   TOTAL KNEE ARTHROPLASTY Left 11/28/2020   Procedure: LEFT KNEE PATELLAFEMORL REPLACEMENT;   Surgeon: Marcene Corning, MD;  Location: [facility];  Service: Orthopedics;  Laterality: Left;    Allergies  Allergies  Allergen Reactions   Delsym [Dextromethorphan] Other (See Comments)    Gets loopy/memory loss   Aspirin Nausea And Vomiting    GI upset    Mucinex D [Pseudoephedrine-Guaifenesin Er] Hypertension   Ibuprofen Other (See Comments)    Stomach irritation    History of Present Illness    History Tavis comes today for ongoing assessment and management of frequent PVCs with improvement with Multaq, noncardiac chest pain.  He comes today with no cardiac complaints.  He does report that his brother has recently been diagnosed with peripheral arterial disease of the lower extremities requiring amputation.  He is concerned about this of with himself.  He denies any intermittent claudication symptoms.  He is medically compliant with Multaq and pravastatin.  He denies chest pain, dyspnea on exertion, or fatigue.  He moves furniture for living and has not had any issues concerning being able to do so or lack of energy.  Home Medications    Current Outpatient Medications  Medication Sig Dispense Refill   albuterol (VENTOLIN HFA) 108 (90 Base) MCG/ACT inhaler TAKE 2 PUFFS BY MOUTH EVERY 6 HOURS AS NEEDED FOR WHEEZE OR SHORTNESS OF BREATH 8.5 each 1   diclofenac Sodium (VOLTAREN) 1 % GEL Apply 1 application. topically 4 (four) times daily as needed (pain).  Erenumab-aooe (AIMOVIG) 140 MG/ML SOAJ Inject 140 mg into the skin every 30 (thirty) days. 3 mL 3   HYDROcodone-acetaminophen (NORCO/VICODIN) 5-325 MG tablet Take 1-2 tablets by mouth every 6 (six) hours as needed for moderate pain or severe pain (post op pain). 40 tablet 0   ketoconazole (NIZORAL) 2 % cream Apply 1 Application topically daily. 30 g 0   Melatonin 5 MG CAPS Take 5 mg by mouth at bedtime.     meloxicam (MOBIC) 15 MG tablet Take 1 tablet (15 mg total) by mouth daily. 90 tablet 1   pantoprazole (PROTONIX) 20 MG tablet  Take 1 tablet (20 mg total) by mouth daily before breakfast. 90 tablet 1   rOPINIRole (REQUIP) 0.5 MG tablet Take 1 tablet (0.5 mg total) by mouth at bedtime. 90 tablet 3   SUMAtriptan 6 MG/0.5ML SOAJ INJECT 6 MG INTO THE SKIN DAILY AS NEEDED AT MIGRAINE ONSET. MAY REPEAT 1 TIME AFTER 1 HOUR IF NEEDED 6 mL 11   tiZANidine (ZANAFLEX) 4 MG tablet Take 1 tablet (4 mg total) by mouth every 6 (six) hours as needed for muscle spasms. 40 tablet 1   topiramate (TOPAMAX) 50 MG tablet Take one tablet in the AM and two tablets in the PM. 270 tablet 3   triamcinolone cream (KENALOG) 0.1 % Apply 1 Application topically 2 (two) times daily as needed (rash).     dronedarone (MULTAQ) 400 MG tablet Take 1 tablet (400 mg total) by mouth 2 (two) times daily with a meal. 180 tablet 1   nitroGLYCERIN (NITROSTAT) 0.4 MG SL tablet Place 1 tablet (0.4 mg total) under the tongue every 5 (five) minutes x 3 doses as needed for chest pain (If no relief after 3rd dose, go to ER.). 25 tablet 1   pravastatin (PRAVACHOL) 20 MG tablet Take 1 tablet (20 mg total) by mouth daily. 90 tablet 1   No current facility-administered medications for this visit.     Family History    Family History  Problem Relation Age of Onset   Heart disease Father        defibrilator   Hyperlipidemia Father    Hypertension Father    Alzheimer's disease Father    Other Father        stents in legs   Aneurysm Mother        brain   Hypertension Brother    Stroke Brother        X2   He indicated that his mother is deceased. He indicated that his father is deceased. He indicated that his brother is alive. He indicated that his maternal grandmother is deceased. He indicated that his maternal grandfather is deceased. He indicated that his paternal grandmother is deceased. He indicated that his paternal grandfather is deceased.  Social History    Social History   Socioeconomic History   Marital status: Divorced    Spouse name: Not on file    Number of children: 0   Years of education: Not on file   Highest education level: Not on file  Occupational History   Occupation: disabled  Tobacco Use   Smoking status: Former    Packs/day: 1.00    Years: 1.00    Additional pack years: 0.00    Total pack years: 1.00    Types: Cigarettes    Quit date: 1992    Years since quitting: 32.4    Passive exposure: Past   Smokeless tobacco: Never   Tobacco comments:    Pt  stopped smoking in the early 90's  Vaping Use   Vaping Use: Never used  Substance and Sexual Activity   Alcohol use: No   Drug use: No   Sexual activity: Not Currently  Other Topics Concern   Not on file  Social History Narrative   03/16/18: Lives alone with dog, [place]are [place], apartment. Wife of 16 years left him about 2 years ago, and pt. has had difficulty adjusting.   Says his brother Fredrik Cove, who lives in [place], is his main contact, source of support, as well as his dog.    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 States he uses painting as additional therapy.    Social Determinants of Health   Financial Resource Strain: Medium Risk (04/06/2021)   Overall Financial Resource Strain (CARDIA)    Difficulty of Paying Living Expenses: Somewhat hard  Food Insecurity: Food Insecurity Present (04/06/2021)   Hunger Vital Sign    Worried About Running Out of Food in the Last Year: Often true    Ran Out of Food in the Last Year: Sometimes true  Transportation Needs: No Transportation Needs (11/30/2021)   PRAPARE - Administrator, Civil Service (Medical): No    Lack of Transportation (Non-Medical): No  Physical Activity: Sufficiently Active (04/06/2021)   Exercise Vital Sign    Days of Exercise per Week: 3 days    Minutes of Exercise per Session: 120 min  Stress: No Stress Concern Present (04/06/2021)   [employer] of Occupational Health - Occupational Stress  Questionnaire    Feeling of Stress : Not at all  Social Connections: Moderately Integrated (04/06/2021)   Social Connection and Isolation Panel [NHANES]    Frequency of Communication with Friends and Family: More than three times a week    Frequency of Social Gatherings with Friends and Family: More than three times a week    Attends Religious Services: More than 4 times per year    Active Member of [employer] or Organizations: Yes    Attends Banker Meetings: More than 4 times per year    Marital Status: Divorced  Intimate Partner Violence: Unknown (04/06/2021)   Humiliation, Afraid, Rape, and Kick questionnaire    Fear of Current or Ex-Partner: No    Emotionally Abused: No    Physically Abused: Not on file    Sexually Abused: No     Review of Systems    General:  No chills, fever, night sweats or weight changes.  Cardiovascular:  No chest pain, dyspnea on exertion, edema, orthopnea, palpitations, paroxysmal nocturnal dyspnea. Dermatological: No rash, lesions/masses Respiratory: No cough, dyspnea Urologic: No hematuria, dysuria Abdominal:   No nausea, vomiting, diarrhea, bright red blood per rectum, melena, or hematemesis Neurologic:  No visual changes, wkns, changes in mental status. All other systems reviewed and are otherwise negative except as noted above.     Physical Exam    VS:  BP 132/82   Pulse 67   Ht 6\' 2"  (1.88 m)   Wt 269 lb 3.2 oz (122.1 kg)   SpO2 97%   BMI 34.56 kg/m  , BMI Body mass index is 34.56 kg/m.     GEN: Well nourished, well developed, in no acute distress.  Obese HEENT: normal. Neck: Supple, no JVD, carotid bruits, or masses. Cardiac: RRR, no murmurs, rubs, or gallops. No clubbing, cyanosis, edema.  Radials/DP/PT 2+ and equal bilaterally.  Respiratory:  Respirations  regular and unlabored, clear to auscultation bilaterally. GI: Soft, nontender, nondistended, BS + x 4. MS: no deformity or atrophy. Skin: warm and dry, no rash. Neuro:   Strength and sensation are intact. Psych: Normal affect.  Accessory Clinical Findings    ECG personally reviewed by me today-sinus bradycardia heart rate 59 bpm.  No acute changes.  Lab Results  Component Value Date   WBC 7.8 12/13/2021   HGB 14.6 12/13/2021   HCT 42.0 12/13/2021   MCV 92.3 12/13/2021   PLT 172 12/13/2021   Lab Results  Component Value Date   CREATININE 1.20 12/13/2021   BUN 28 (H) 12/13/2021   NA 138 12/13/2021   K 3.8 12/13/2021   CL 110 12/13/2021   CO2 20 (L) 12/13/2021   Lab Results  Component Value Date   ALT 20 02/05/2022   AST 20 02/05/2022   ALKPHOS 68 02/05/2022   BILITOT 0.4 02/05/2022   Lab Results  Component Value Date   CHOL 115 02/05/2022   HDL 26.50 (L) 02/05/2022   LDLCALC 58 02/05/2022   LDLDIRECT 70.0 07/07/2018   TRIG 150.0 (H) 02/05/2022   CHOLHDL 4 02/05/2022    Lab Results  Component Value Date   HGBA1C 5.2 02/05/2022    Review of Prior Studies: Coronary CTA 08/11/2022 IMPRESSION: 1. No evidence of CAD, CADRADS = 0.   2. Coronary calcium score of 0. This was 0 percentile for age and sex matched control.   3. Normal coronary origin with right dominance.   4. Consider non-coronary causes of chest pain    Assessment & Plan   1.  Frequent PVCs and PACs: Patient has been treated with Multaq and has had no further complaints of the symptoms.  Continue current medication regimen refills are provided.  2.  Hypercholesterolemia: Remains on pravastatin 20 mg daily.  Will check fasting lipids LFTs to evaluate current status.  Refills are provided.  3.  OSA: Inspire device in situ followed by neurology.  4.  Obesity: Advised weight loss low-cholesterol Mediterranean diet.         Signed, Bettey Mare. Liborio Nixon, ANP, AACC   08/16/2022 12:14 PM      Office [phone] Fax [phone]  Notice: This dictation was prepared with Dragon dictation along with smaller phrase technology. Any transcriptional errors  that result from this process are unintentional and may not be corrected upon review.

## 2022-08-08 ENCOUNTER — Other Ambulatory Visit: Payer: Self-pay | Admitting: Family Medicine

## 2022-08-08 NOTE — Telephone Encounter (Signed)
Pt is calling and need a refill on meloxicam 15 mg #90 w/refills please send to  [facility], [place] - [address] Phone: [phone]  Fax: [phone]

## 2022-08-14 MED ORDER — MELOXICAM 15 MG PO TABS: 15.0000 mg | ORAL_TABLET | Freq: Every day | ORAL | 1 refills | Status: DC

## 2022-08-16 ENCOUNTER — Encounter: Payer: Self-pay | Admitting: Adult Health

## 2022-08-16 ENCOUNTER — Ambulatory Visit: Payer: 59 | Attending: Adult Health | Admitting: Adult Health

## 2022-08-16 VITALS — BP 132/82 | HR 67 | Ht 74.0 in | Wt 269.2 lb

## 2022-08-16 DIAGNOSIS — G4733 Obstructive sleep apnea (adult) (pediatric): Secondary | ICD-10-CM | POA: Diagnosis not present

## 2022-08-16 DIAGNOSIS — E7800 Pure hypercholesterolemia, unspecified: Secondary | ICD-10-CM

## 2022-08-16 DIAGNOSIS — I493 Ventricular premature depolarization: Secondary | ICD-10-CM | POA: Diagnosis not present

## 2022-08-16 MED ORDER — PRAVASTATIN SODIUM 20 MG PO TABS: 20.0000 mg | ORAL_TABLET | Freq: Every day | ORAL | 1 refills | Status: DC

## 2022-08-16 MED ORDER — MULTAQ 400 MG PO TABS: 400.0000 mg | ORAL_TABLET | Freq: Two times a day (BID) | ORAL | 1 refills | Status: DC

## 2022-08-16 NOTE — Patient Instructions (Signed)
Medication Instructions:  No Changes *If you need a refill on your cardiac medications before your next appointment, please call your pharmacy*   Lab Work: CMET,CBC, Lipid Panel : To Be Done 1 week. If you have labs (blood work) drawn today and your tests are completely normal, you will receive your results only by: MyChart Message (if you have MyChart) OR A paper copy in the mail If you have any lab test that is abnormal or we need to change your treatment, we will call you to review the results.   Testing/Procedures: No Testing   Follow-Up: At [facility], you and your health needs are our priority.  As part of our continuing mission to provide you with exceptional heart care, we have created designated Provider Care Teams.  These Care Teams include your primary Cardiologist (physician) and Advanced Practice Providers (APPs -  Physician Assistants and Nurse Practitioners) who all work together to provide you with the care you need, when you need it.  We recommend signing up for the patient portal called "MyChart".  Sign up information is provided on this After Visit Summary.  MyChart is used to connect with patients for Virtual Visits (Telemedicine).  Patients are able to view lab/test results, encounter notes, upcoming appointments, etc.  Non-urgent messages can be sent to your provider as well.   To learn more about what you can do with MyChart, go to [email or web address].    Your next appointment:   6 month(s)  Provider:   Rollene Rotunda, MD

## 2022-08-20 ENCOUNTER — Encounter: Payer: Self-pay | Admitting: Dermatology

## 2022-08-20 ENCOUNTER — Ambulatory Visit (INDEPENDENT_AMBULATORY_CARE_PROVIDER_SITE_OTHER): Payer: 59 | Admitting: Dermatology

## 2022-08-20 VITALS — BP 122/81 | HR 68

## 2022-08-20 DIAGNOSIS — W908XXA Exposure to other nonionizing radiation, initial encounter: Secondary | ICD-10-CM | POA: Diagnosis not present

## 2022-08-20 DIAGNOSIS — I493 Ventricular premature depolarization: Secondary | ICD-10-CM | POA: Diagnosis not present

## 2022-08-20 DIAGNOSIS — E7800 Pure hypercholesterolemia, unspecified: Secondary | ICD-10-CM | POA: Diagnosis not present

## 2022-08-20 DIAGNOSIS — L578 Other skin changes due to chronic exposure to nonionizing radiation: Secondary | ICD-10-CM | POA: Diagnosis not present

## 2022-08-20 DIAGNOSIS — L570 Actinic keratosis: Secondary | ICD-10-CM

## 2022-08-20 DIAGNOSIS — L814 Other melanin hyperpigmentation: Secondary | ICD-10-CM

## 2022-08-20 DIAGNOSIS — D1801 Hemangioma of skin and subcutaneous tissue: Secondary | ICD-10-CM

## 2022-08-20 DIAGNOSIS — L821 Other seborrheic keratosis: Secondary | ICD-10-CM | POA: Diagnosis not present

## 2022-08-20 DIAGNOSIS — L918 Other hypertrophic disorders of the skin: Secondary | ICD-10-CM | POA: Diagnosis not present

## 2022-08-20 DIAGNOSIS — Z1283 Encounter for screening for malignant neoplasm of skin: Secondary | ICD-10-CM

## 2022-08-20 DIAGNOSIS — D239 Other benign neoplasm of skin, unspecified: Secondary | ICD-10-CM | POA: Diagnosis not present

## 2022-08-20 LAB — COMPREHENSIVE METABOLIC PANEL

## 2022-08-20 LAB — CBC
Hemoglobin: 15.6 g/dL (ref 13.0–17.7)
RDW: 12.5 % (ref 11.6–15.4)

## 2022-08-20 LAB — LIPID PANEL

## 2022-08-20 NOTE — Progress Notes (Signed)
New Patient Visit   Subjective  Steven Vincent is a 57 y.o. male who presents for the following: Skin Cancer Screening and Upper Body Skin Exam  The patient presents for Upper Body Skin Exam (UBSE) for skin cancer screening and mole check. The patient has spots, moles and lesions to be evaluated, some may be new or changing and the patient has concerns that these could be cancer.    The following portions of the chart were reviewed this encounter and updated as appropriate: medications, allergies, medical history  Review of Systems:  No other skin or systemic complaints except as noted in HPI or Assessment and Plan.  Objective  Well appearing patient in no apparent distress; mood and affect are within normal limits.  All skin waist up examined. Relevant physical exam findings are noted in the Assessment and Plan.  both ears and lower lip (5) Erythematous thin papules/macules with gritty scale.      Assessment & Plan   Visit Summary: Steven Vincent, a new patient, presented with an erythematous plaque on his ear and actinic keratosis on his right lower lip, both treated with cryotherapy. He also has mild actinic damage and lentigines on his face, a benign dermatofibroma on his left foot, and various benign lesions on his back and neck. Skin tags on his neck were addressed with cryotherapy for one irritated tag, and he was counseled on weight loss and sun protection. Follow-up is scheduled for six months.  SEBORRHEIC KERATOSIS - Stuck-on, waxy, tan-brown papules and/or plaques  - Benign-appearing - Discussed benign etiology and prognosis. - Observe - Call for any changes  AK (actinic keratosis) (5) both ears and lower lip  Destruction of lesion - both ears and lower lip Complexity: simple   Destruction method: cryotherapy   Informed consent: discussed and consent obtained   Timeout:  patient name, date of birth, surgical site, and procedure verified Lesion destroyed  using liquid nitrogen: Yes   Region frozen until ice ball extended beyond lesion: Yes   Outcome: patient tolerated procedure well with no complications   Post-procedure details: wound care instructions given    Lentigines, Seborrheic Keratoses, Hemangiomas - Benign normal skin lesions - Benign-appearing - Call for any changes  Actinic Damage on both ears -Erythematous plaque with fine scale and gritty texture - Chronic condition, secondary to cumulative UV/sun exposure - diffuse scaly erythematous macules with underlying dyspigmentation - Recommend daily broad spectrum sunscreen SPF 30+ to sun-exposed areas, reapply every 2 hours as needed.  - Staying in the shade or wearing long sleeves, sun glasses (UVA+UVB protection) and wide brim hats (4-inch brim around the entire circumference of the hat) are also recommended for sun protection.  - Call for new or changing lesions.  Acrochordons (Skin Tags) - Fleshy, skin-colored pedunculated papules - Benign appearing.  - Observe. - If desired, they can be removed with an in office procedure that is not covered by insurance. - Please call the clinic if you notice any new or changing lesions.   DERMATOFIBROMA Exam: Firm pink/brown papulenodule with dimple sign. Pt Education A dermatofibroma is a benign growth possibly related to trauma, such as an insect bite, cut from shaving, or inflamed acne-type bump.  Treatment options to remove include shave or excision with resulting scar and risk of recurrence.  Since benign-appearing and not bothersome, will observe for now.    Treatment Plan: -Reassurance   Assessment & Plan   AK (actinic keratosis) (5) both ears and lower lip  Destruction of lesion - both ears and lower lip Complexity: simple   Destruction method: cryotherapy   Informed consent: discussed and consent obtained   Timeout:  patient name, date of birth, surgical site, and procedure verified Lesion destroyed using liquid  nitrogen: Yes   Region frozen until ice ball extended beyond lesion: Yes   Outcome: patient tolerated procedure well with no complications   Post-procedure details: wound care instructions given    Skin tag Neck - Anterior  LN2     Skin cancer screening performed today.  Return in about 6 months (around 02/19/2023) for AK f/u.  I, Tillie Fantasia, CMA, am acting as scribe for [employer], DO.   Documentation: I have reviewed the above documentation for accuracy and completeness, and I agree with the above.  Langston Reusing, DO

## 2022-08-20 NOTE — Patient Instructions (Signed)
Cryotherapy Aftercare  Wash gently with soap and water everyday.   Apply Vaseline and Band-Aid daily until healed.   Due to recent changes in healthcare laws, you may see results of your pathology and/or laboratory studies on MyChart before the doctors have had a chance to review them. We understand that in some cases there may be results that are confusing or concerning to you. Please understand that not all results are received at the same time and often the doctors may need to interpret multiple results in order to provide you with the best plan of care or course of treatment. Therefore, we ask that you please give us 2 business days to thoroughly review all your results before contacting the office for clarification. Should we see a critical lab result, you will be contacted sooner.   If You Need Anything After Your Visit  If you have any questions or concerns for your doctor, please call our main line at [phone] If no one answers, please leave a voicemail as directed and we will return your call as soon as possible. Messages left after 4 pm will be answered the following business day.   You may also send us a message via MyChart. We typically respond to MyChart messages within 1-2 business days.  For prescription refills, please ask your pharmacy to contact our office. Our fax number is [phone].  If you have an urgent issue when the clinic is closed that cannot wait until the next business day, you can page your doctor at the number below.    Please note that while we do our best to be available for urgent issues outside of office hours, we are not available 24/7.   If you have an urgent issue and are unable to reach us, you may choose to seek medical care at your doctor's office, retail clinic, urgent care center, or emergency room.  If you have a medical emergency, please immediately call 911 or go to the emergency department. In the event of inclement weather, please call our  main line at [phone] for an update on the status of any delays or closures.  Dermatology Medication Tips: Please keep the boxes that topical medications come in in order to help keep track of the instructions about where and how to use these. Pharmacies typically print the medication instructions only on the boxes and not directly on the medication tubes.   If your medication is too expensive, please contact our office at [phone] or send us a message through MyChart.   We are unable to tell what your co-pay for medications will be in advance as this is different depending on your insurance coverage. However, we may be able to find a substitute medication at lower cost or fill out paperwork to get insurance to cover a needed medication.   If a prior authorization is required to get your medication covered by your insurance company, please allow us 1-2 business days to complete this process.  Drug prices often vary depending on where the prescription is filled and some pharmacies may offer cheaper prices.  The website www.goodrx.com contains coupons for medications through different pharmacies. The prices here do not account for what the cost may be with help from insurance (it may be cheaper with your insurance), but the website can give you the price if you did not use any insurance.  - You can print the associated coupon and take it with your prescription to the pharmacy.  - You may also   stop by our office during regular business hours and pick up a GoodRx coupon card.  - If you need your prescription sent electronically to a different pharmacy, notify our office through [place] MyChart or by phone at [phone]     

## 2022-08-21 LAB — COMPREHENSIVE METABOLIC PANEL
ALT: 27 IU/L (ref 0–44)
AST: 24 IU/L (ref 0–40)
Albumin: 4.2 g/dL (ref 3.8–4.9)
BUN/Creatinine Ratio: 22 — ABNORMAL HIGH (ref 9–20)
Bilirubin Total: 0.3 mg/dL (ref 0.0–1.2)
CO2: 20 mmol/L (ref 20–29)
Calcium: 9.6 mg/dL (ref 8.7–10.2)
Creatinine, Ser: 0.95 mg/dL (ref 0.76–1.27)
Glucose: 96 mg/dL (ref 70–99)
Sodium: 143 mmol/L (ref 134–144)
eGFR: 93 mL/min/{1.73_m2} (ref >59)

## 2022-08-21 LAB — CBC
Hematocrit: 47 % (ref 37.5–51.0)
MCH: 31.5 pg (ref 26.6–33.0)
MCHC: 33.2 g/dL (ref 31.5–35.7)
MCV: 95 fL (ref 79–97)
Platelets: 188 10*3/uL (ref 150–450)
RBC: 4.95 x10E6/uL (ref 4.14–5.80)
WBC: 7.5 10*3/uL (ref 3.4–10.8)

## 2022-08-21 LAB — LIPID PANEL
Chol/HDL Ratio: 3.8 ratio (ref 0.0–5.0)
Cholesterol, Total: 115 mg/dL (ref 100–199)
HDL: 30 mg/dL — ABNORMAL LOW (ref >39)
LDL Chol Calc (NIH): 58 mg/dL (ref 0–99)
Triglycerides: 159 mg/dL — ABNORMAL HIGH (ref 0–149)

## 2022-08-22 ENCOUNTER — Telehealth: Payer: Self-pay

## 2022-08-22 ENCOUNTER — Telehealth: Payer: Self-pay | Admitting: Family Medicine

## 2022-08-22 NOTE — Telephone Encounter (Signed)
Pt aware MD is OOO on Thursdays.

## 2022-08-22 NOTE — Telephone Encounter (Signed)
-----   Message from Jodelle Gross, NP sent at 08/21/2022 10:34 AM EDT ----- I have reviewed his cholesterol levels.  LDL (bad cholesterol) is under control.  Triglycerides are still elevated and should be followed by PCP. Total cholesterol is well controlled. No changes in his regimen for now,   KL

## 2022-08-22 NOTE — Telephone Encounter (Addendum)
Pt states he has been out of meloxicam (MOBIC) 15 MG tablet for about a month now and really needs a refill.  Pt states he is in a lot of pain and is asking that MD Please send Rx, ASAP.  Also, Pt saw another provider that told him his Cholesterol is very high.   Pt is wondering if MD needs to increase cholesterol medication?  Pt aware MD is OOO on Thursdays.   Please advise.   [facility] - [place], [place] - [address] Phone: [phone]  Fax: [phone]

## 2022-08-22 NOTE — Telephone Encounter (Addendum)
Called patient regarding results. Patient had understanding of results.----- Message from Jodelle Gross, NP sent at 08/21/2022 10:34 AM EDT ----- I have reviewed his cholesterol levels.  LDL (bad cholesterol) is under control.  Triglycerides are still elevated and should be followed by PCP. Total cholesterol is well controlled. No changes in his regimen for now,   KL

## 2022-08-22 NOTE — Telephone Encounter (Signed)
Pt call and stated he need you to call him back before 10:00 tomorrow if you can it is about his BP.it is a message from his cardiology.

## 2022-08-23 NOTE — Telephone Encounter (Signed)
Per his blood work from cardiology - his triglycerides are still elevated. Patient is wanting to know what to do about that.

## 2022-08-23 NOTE — Telephone Encounter (Signed)
Most likely he was given recommendations at the time labs were reviewed. Thanks, BJ

## 2022-08-26 MED ORDER — MELOXICAM 15 MG PO TABS: 15.0000 mg | ORAL_TABLET | Freq: Every day | ORAL | 1 refills | Status: DC

## 2022-08-26 NOTE — Telephone Encounter (Signed)
Triglycerides were slightly above normal range, LDL is at goal.  For now recommend continue pravastatin 20 mg daily and low-fat diet. We can re-check FLP in 6-12 months. Thanks, BJ

## 2022-08-26 NOTE — Telephone Encounter (Signed)
I called and spoke with patient. We went over the information below and he verbalized understanding. Re-sent rx for Meloxicam to [facility] due to them cancelling Rx. Patient mentioned his back is acting up, recommended following up with his ortho, he will arrange appt.

## 2022-09-06 DIAGNOSIS — M5416 Radiculopathy, lumbar region: Secondary | ICD-10-CM | POA: Diagnosis not present

## 2022-09-16 DIAGNOSIS — M25561 Pain in right knee: Secondary | ICD-10-CM | POA: Diagnosis not present

## 2022-09-19 DIAGNOSIS — M5416 Radiculopathy, lumbar region: Secondary | ICD-10-CM | POA: Diagnosis not present

## 2022-10-02 DIAGNOSIS — M25561 Pain in right knee: Secondary | ICD-10-CM | POA: Diagnosis not present

## 2022-10-02 DIAGNOSIS — M1711 Unilateral primary osteoarthritis, right knee: Secondary | ICD-10-CM | POA: Diagnosis not present

## 2022-10-08 ENCOUNTER — Other Ambulatory Visit (HOSPITAL_COMMUNITY): Payer: Self-pay | Admitting: Orthopaedic Surgery

## 2022-10-08 DIAGNOSIS — M545 Low back pain, unspecified: Secondary | ICD-10-CM

## 2022-10-11 ENCOUNTER — Telehealth: Payer: Self-pay | Admitting: Family Medicine

## 2022-10-11 NOTE — Telephone Encounter (Signed)
fyi

## 2022-10-11 NOTE — Telephone Encounter (Signed)
Fyi pt is calling to let md know he will be having MRI of spine on 10-13-2022

## 2022-10-13 ENCOUNTER — Ambulatory Visit (HOSPITAL_COMMUNITY)
Admission: RE | Admit: 2022-10-13 | Discharge: 2022-10-13 | Disposition: A | Discharge status: Home / Self Care | Payer: 59 | Source: Ambulatory Visit | Attending: Orthopaedic Surgery | Admitting: Orthopaedic Surgery

## 2022-10-13 DIAGNOSIS — M47816 Spondylosis without myelopathy or radiculopathy, lumbar region: Secondary | ICD-10-CM | POA: Diagnosis not present

## 2022-10-13 DIAGNOSIS — M48061 Spinal stenosis, lumbar region without neurogenic claudication: Secondary | ICD-10-CM | POA: Diagnosis not present

## 2022-10-13 DIAGNOSIS — M5127 Other intervertebral disc displacement, lumbosacral region: Secondary | ICD-10-CM | POA: Diagnosis not present

## 2022-10-13 DIAGNOSIS — M545 Low back pain, unspecified: Secondary | ICD-10-CM | POA: Diagnosis not present

## 2022-10-13 DIAGNOSIS — M5126 Other intervertebral disc displacement, lumbar region: Secondary | ICD-10-CM | POA: Diagnosis not present

## 2022-10-14 ENCOUNTER — Other Ambulatory Visit: Payer: Self-pay | Admitting: Adult Health

## 2022-10-14 ENCOUNTER — Other Ambulatory Visit: Payer: Self-pay | Admitting: Cardiovascular Disease

## 2022-10-14 NOTE — Telephone Encounter (Signed)
Rx request sent to pharmacy.  

## 2022-10-15 ENCOUNTER — Other Ambulatory Visit: Payer: Self-pay | Admitting: Adult Health

## 2022-10-29 DIAGNOSIS — M5416 Radiculopathy, lumbar region: Secondary | ICD-10-CM | POA: Diagnosis not present

## 2022-11-07 DIAGNOSIS — M5416 Radiculopathy, lumbar region: Secondary | ICD-10-CM | POA: Diagnosis not present

## 2022-11-23 ENCOUNTER — Other Ambulatory Visit: Payer: Self-pay | Admitting: Neurology

## 2022-11-29 DIAGNOSIS — M5416 Radiculopathy, lumbar region: Secondary | ICD-10-CM | POA: Diagnosis not present

## 2022-12-05 DIAGNOSIS — M25521 Pain in right elbow: Secondary | ICD-10-CM | POA: Diagnosis not present

## 2022-12-17 ENCOUNTER — Telehealth: Payer: Self-pay | Admitting: Adult Health

## 2022-12-17 NOTE — Telephone Encounter (Signed)
Patient is using a sleep study watch. He has everything set up correctly but it doesn't say that it was completed. According to his sleep study watch he may need to do it again but is unsure. His inspire went up to a different level. He would like to know if he needs to re-do the sleep study.

## 2022-12-30 NOTE — Telephone Encounter (Signed)
Pt made aware sleep study is in system.  Nothing further was needed.

## 2023-01-10 ENCOUNTER — Telehealth: Payer: Self-pay | Admitting: Family Medicine

## 2023-01-10 NOTE — Telephone Encounter (Signed)
Patient dropped off document  Athlete Information Form , to be filled out by provider. Patient requested to send it back via Call Patient to pick up within 5-days. Document is located in providers tray at front office.Please advise at Mobile [phone] (mobile)

## 2023-01-10 NOTE — Telephone Encounter (Signed)
In your bin to be signed.  

## 2023-01-13 ENCOUNTER — Ambulatory Visit (HOSPITAL_BASED_OUTPATIENT_CLINIC_OR_DEPARTMENT_OTHER): Payer: 59 | Admitting: Pulmonary Disease

## 2023-01-13 ENCOUNTER — Encounter (HOSPITAL_BASED_OUTPATIENT_CLINIC_OR_DEPARTMENT_OTHER): Payer: Self-pay | Admitting: Pulmonary Disease

## 2023-01-13 VITALS — BP 139/71 | HR 70 | Ht 74.0 in | Wt 281.0 lb

## 2023-01-13 DIAGNOSIS — G4733 Obstructive sleep apnea (adult) (pediatric): Secondary | ICD-10-CM | POA: Diagnosis not present

## 2023-01-13 NOTE — Patient Instructions (Addendum)
We decreased amplitude to level 2 = 0.6 V

## 2023-01-13 NOTE — Progress Notes (Signed)
   Subjective:    Patient ID: Steven Vincent, male    DOB: Dec 22, 1965, 57 y.o.   MRN: [id number]  HPI  57 yo male for FU of OSA and Insomnia (CPAP intolerant) s/p Inspire device implant (05/16/21)  PMH : seizure disorder Patient is a Cytogeneticist, gold Gregor Hams, Anise Salvo is [place].  (Patient has some intellectual disability reads and writes at a third grade level.   His device was activated in 2023, he was seen 05/2022 and noted to be set with a range of 0.7 to 1.1 V with amplitude of 0.9 06/2022 amplitude was 0.8 V He arrives today after having completed his WatchPAT sleep study Reports good compliance with his device.  Sometime this went on for a month states that when his roommate disturbs him at night he is not able to Start delay 30 minutes, pause time is 20 minutes in therapy duration 8 hours He feels he has benefited from the device.  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  Supine AHI 10.5, REM AHI 24.3. HST 02/26/18 >> AHI 11.6, SpO2 low 79% HST 07/07/18 >> AHI 7.7, SpO2 low 83% Auto CPAP 11/20/19 to 12/19/19 >> used on 12 of 30 nights with average 2 hrs 9 min.  Average AHI 4.3 with median CPAP 11 and 95 th percentile CPAP 14 cm H2O CPAP titration 01/31/20 >> CPAP 13 >> AHI 0, no REM.  Review of Systems neg for any significant sore throat, dysphagia, itching, sneezing, nasal congestion or excess/ purulent secretions, fever, chills, sweats, unintended wt loss, pleuritic or exertional cp, hempoptysis, orthopnea pnd or change in chronic leg swelling. Also denies presyncope, palpitations, heartburn, abdominal pain, nausea, vomiting, diarrhea or change in bowel or urinary habits, dysuria,hematuria, rash, arthralgias, visual complaints, headache, numbness weakness or ataxia.      Objective:   Physical Exam  Gen. Pleasant, obese, in no distress ENT - no lesions, no post nasal drip, on protrusion examined Neck: No JVD, no thyromegaly, no carotid bruits Lungs: no use of accessory muscles, no dullness to percussion, decreased without rales or rhonchi  Cardiovascular: Rhythm regular, heart sounds  normal, no murmurs or gallops, no peripheral edema Musculoskeletal: No deformities, no cyanosis or clubbing , no tremors       Assessment & Plan:

## 2023-01-13 NOTE — Assessment & Plan Note (Signed)
Hypoglossal nerve stimulator was reassessed today.  Goal of the follow-up visit was to ensure good compliance, good subjective benefit, good tongue motion and good sense lead waveforms .incision sites appear good, tongue protrusion was examined.  Download was reviewed and usage is average 6.5 hours every night, 87% 4 hours, average pauses 1 per night  He is at level 2 = 0.8V .  He seemed overstimulated at this level with tongue deviation to the left Programming :  1.  Optimal level was 0.6= level 2.  Changed to 0.5 V lower limit, upper limit 0.9 V 2.  Start delay  to 30 minutes, pause time 25 minutes, duration 8hours 3.  Sensing waveform was analyzed for 3 minutes 4.  Sleep remote education was provided patient demonstrated competency with the remote and was aware of patient Instruction videos and sleep remote guide 5.  Download was reviewed to confirm good compliance with the device 6.  WatchPAT sleep study on 0.8 V shows residual AHI of 8.6/hour which may be acceptable.  Hopefully decreasing to therapeutic amplitude, will comfort and we can consider repeating sleep study at this level in the future

## 2023-01-17 NOTE — Telephone Encounter (Signed)
Patient is aware they are ready for pick up, placed up front & copy sent to scan.

## 2023-01-17 NOTE — Telephone Encounter (Signed)
Done. Steven Vincent

## 2023-01-21 DIAGNOSIS — M5416 Radiculopathy, lumbar region: Secondary | ICD-10-CM | POA: Diagnosis not present

## 2023-01-27 NOTE — Progress Notes (Unsigned)
Cardiology Office Note:   Date:  01/30/2023  ID:  Steven Vincent, DOB 1965/12/07, MRN [id number] PCP: [place], Betty G, MD  [facility] Providers Cardiologist:  Rollene Rotunda, MD Electrophysiologist:  Will Jorja Loa, MD {  History of Present Illness:   Steven Vincent is a 57 y.o. male who presents for follow up of  PVCs and bradycardia.   POET (Plain Old Exercise Treadmill)  in January 2018 was normal. Holter in October 2018 showed frequent PVCs with burden 16%. Average HR 66. Slowest HR 40 while asleep. Beta blocker was reduced and eventually stopped. Echo was normal.   In 2019 Lexiscan  Myoview showed a small fixed apical defect and was otherwise normal. He had repeat Holter in December 2019 showing again frequent PVCs with a burden of 26%. 4 beat run NSVT. Minimum HR 58.  He was seen in the ED on March 9 with concern about slow HR, chest pain, and fatigue. Noted to have pulse of 40 by pulse ox reading. ECG showed frequent PVCs with bigeminy and couplets.   He was eventually treated with Multaq.  He had much improvement in symptoms.    He continued to have chest pain and had no CAD on CT.    Since I last saw him he has done well.  He is looking for a job.  He does some odd jobs.  He wants to do [employer].  He is limited in lifting because of some lumbar pain.  He had knee surgeries.  He has some occasional mild dizziness.  He has occasional shortness of breath but he thinks is because he has not been doing as much activity wise.  He denies any significant sustained palpitations.no presyncope or syncope.  He had no chest pressure, neck or arm discomfort.  ROS: As stated in the HPI and negative for all other systems.  Studies Reviewed:    EKG:   EKG Interpretation Date/Time:  Thursday January 30 2023 09:26:54 EST Ventricular Rate:  57 PR Interval:  168 QRS Duration:  110 QT Interval:  450 QTC Calculation: 438 R Axis:   -29  Text Interpretation: Sinus  bradycardia When compared with ECG of 16-Aug-2022 10:07, No significant change was found Confirmed by Rollene Rotunda ([id number]) on 01/30/2023 9:31:49 AM     Risk Assessment/Calculations:      Physical Exam:   VS:  BP (!) 142/80 (BP Location: Left Arm, Patient Position: Sitting, Cuff Size: Large)   Pulse 68   Ht 6\' 2"  (1.88 m)   Wt 282 lb 6.4 oz (128.1 kg)   SpO2 96%   BMI 36.26 kg/m    Wt Readings from Last 3 Encounters:  01/30/23 282 lb 6.4 oz (128.1 kg)  01/13/23 281 lb (127.5 kg)  08/16/22 269 lb 3.2 oz (122.1 kg)     GEN: Well nourished, well developed in no acute distress NECK: No JVD; No carotid bruits CARDIAC: RRR, no murmurs, rubs, gallops RESPIRATORY:  Clear to auscultation without rales, wheezing or rhonchi  ABDOMEN: Soft, non-tender, non-distended EXTREMITIES:  No edema; No deformity   ASSESSMENT AND PLAN:   Frequent PVCs:    These are well-managed and he is tolerating Multaq.  I will check electrolytes and magnesium today  Dyslipidemia: LDL was 58 with an HDL of 30.  No change in therapy.   OSA:   He thinks this is well-managed with his Inspire.   Follow up with me in one year.   Signed, Rollene Rotunda, MD

## 2023-01-28 ENCOUNTER — Ambulatory Visit: Payer: 59 | Admitting: Cardiology

## 2023-01-30 ENCOUNTER — Ambulatory Visit: Payer: 59 | Attending: Cardiology | Admitting: Cardiology

## 2023-01-30 ENCOUNTER — Encounter: Payer: Self-pay | Admitting: Cardiology

## 2023-01-30 VITALS — BP 142/80 | HR 68 | Ht 74.0 in | Wt 282.4 lb

## 2023-01-30 DIAGNOSIS — E785 Hyperlipidemia, unspecified: Secondary | ICD-10-CM

## 2023-01-30 DIAGNOSIS — I493 Ventricular premature depolarization: Secondary | ICD-10-CM | POA: Diagnosis not present

## 2023-01-30 DIAGNOSIS — R079 Chest pain, unspecified: Secondary | ICD-10-CM

## 2023-01-30 DIAGNOSIS — R0602 Shortness of breath: Secondary | ICD-10-CM

## 2023-01-30 DIAGNOSIS — Z79899 Other long term (current) drug therapy: Secondary | ICD-10-CM

## 2023-01-30 DIAGNOSIS — G4733 Obstructive sleep apnea (adult) (pediatric): Secondary | ICD-10-CM

## 2023-01-30 NOTE — Patient Instructions (Signed)
Medication Instructions:  No changes.  *If you need a refill on your cardiac medications before your next appointment, please call your pharmacy*   Lab Work: BMET, Magnesium today If you have labs (blood work) drawn today and your tests are completely normal, you will receive your results only by: MyChart Message (if you have MyChart) OR A paper copy in the mail If you have any lab test that is abnormal or we need to change your treatment, we will call you to review the results.   Follow-Up: At [facility], you and your health needs are our priority.  As part of our continuing mission to provide you with exceptional heart care, we have created designated Provider Care Teams.  These Care Teams include your primary Cardiologist (physician) and Advanced Practice Providers (APPs -  Physician Assistants and Nurse Practitioners) who all work together to provide you with the care you need, when you need it.  Your next appointment:   6 month(s)  Provider:   Rollene Rotunda, MD

## 2023-01-31 LAB — BASIC METABOLIC PANEL
BUN/Creatinine Ratio: 18 (ref 9–20)
BUN: 19 mg/dL (ref 6–24)
CO2: 22 mmol/L (ref 20–29)
Calcium: 9.6 mg/dL (ref 8.7–10.2)
Chloride: 107 mmol/L — ABNORMAL HIGH (ref 96–106)
Creatinine, Ser: 1.05 mg/dL (ref 0.76–1.27)
Glucose: 87 mg/dL (ref 70–99)
Potassium: 4.6 mmol/L (ref 3.5–5.2)
Sodium: 140 mmol/L (ref 134–144)
eGFR: 83 mL/min/{1.73_m2} (ref >59)

## 2023-01-31 LAB — MAGNESIUM: Magnesium: 2 mg/dL (ref 1.6–2.3)

## 2023-02-10 ENCOUNTER — Encounter: Payer: 59 | Admitting: Family Medicine

## 2023-02-11 NOTE — Progress Notes (Signed)
HPI: Steven Vincent is a 57 y.o.male with a PMHx significant for HLD, hearing loss, migraine headaches, OSA, RLS, GERD, schizotypal personality disorder, and PVCs, who is here today for his routine physical examination.  Last CPE: 02/05/2022  Exercise: Patient says he doesn't exercise but is active around the house.  Diet: He says he is eating healthy in general. He is eating a lot of fish and vegetables. He snacks on peanuts. He uses a card from [facility] that limits him to healthy food.  Sleep: 8-9 hours per night.  Alcohol Use: none  Smoking: never Vision: UTD on routine vision care.  Dental: UTD on routine dental care.  Immunization History  Administered Date(s) Administered   Influenza Split 12/03/2010   Influenza Whole 12/04/2006, 11/17/2007, 01/11/2009   Influenza,inj,Quad PF,6+ Mos 10/25/2018   Td 12/04/2006   Zoster Recombinant(Shingrix) 06/15/2022   Health Maintenance  Topic Date Due   Zoster Vaccines- Shingrix (2 of 2) 08/10/2022   INFLUENZA VACCINE  09/26/2022   Medicare Annual Wellness (AWV)  04/10/2023   HIV Screening  03/17/2023 (Originally 08/06/1980)   COVID-19 Vaccine (1) 04/04/2027 (Originally 08/07/1970)   Colonoscopy  09/20/2025   Hepatitis C Screening  Completed   HPV VACCINES  Aged Out   DTaP/Tdap/Td  Discontinued   Prostate ca screening:  Lab Results  Component Value Date   PSA 0.37 02/05/2022   PSA 0.19 08/04/2020   PSA 0.23 03/17/2019  Denies nocturia or increased in urinary frequency.  Chronic medical problems:   Hyperlipidemia: Currently on pravastatin 20 mg daily.  Side effects from medication: none Lab Results  Component Value Date   CHOL 115 08/20/2022   HDL 30 (L) 08/20/2022   LDLCALC 58 08/20/2022   LDLDIRECT 70.0 07/07/2018   TRIG 159 (H) 08/20/2022   CHOLHDL 3.8 08/20/2022   Low back pain:  He follows with orthopedics and recently had an epidural injection.  He takes Meloxicam 15 mg daily for pain.  He  mentions that surgery has been recommended but he does not want to have surgery.   GERD:  Currently on pantoprazole 20 mg daily. He says the medication is effective and he is not having heartburn.   Migraines/seizures:  Currently on Aimovig 140 mg injection every 30 days and topiramate 50 mg in the morning and 100 mg in the evening.   -PVC"s: He is on Multaq 400 mg bid.  -OSA on inspire devise.  He follows once per year with cardiology and pulmonology.  Denies any chest pain, SOB, or palpitations.   -Hearing loss with hearing aids. He sees an Biomedical scientist and a neurologist annually.   He is no longer seeing psychiatry because he was dismissed after he missed an appointments.  Denies auditory or visual hallucinations.   Patient reports no new problems today.   Review of Systems  Constitutional:  Positive for fatigue. Negative for activity change, appetite change and fever.  HENT:  Negative for nosebleeds, sore throat and trouble swallowing.   Eyes:  Negative for redness and visual disturbance.  Respiratory:  Negative for cough, shortness of breath and wheezing.   Cardiovascular:  Negative for chest pain, palpitations and leg swelling.  Gastrointestinal:  Negative for abdominal pain, blood in stool, nausea and vomiting.  Endocrine: Negative for cold intolerance, heat intolerance, polydipsia, polyphagia and polyuria.  Genitourinary:  Negative for decreased urine volume, dysuria, genital sores, hematuria and testicular pain.  Musculoskeletal:  Positive for arthralgias and back pain.  Skin:  Negative for  color change and rash.  Allergic/Immunologic: Positive for environmental allergies.  Neurological:  Positive for dizziness (occasional, no more than usual). Negative for seizures, syncope, weakness, numbness and headaches.  Hematological:  Negative for adenopathy. Does not bruise/bleed easily.  Psychiatric/Behavioral:  Negative for confusion. The patient is not nervous/anxious.     Current Outpatient Medications on File Prior to Visit  Medication Sig Dispense Refill   albuterol (VENTOLIN HFA) 108 (90 Base) MCG/ACT inhaler TAKE 2 PUFFS BY MOUTH EVERY 6 HOURS AS NEEDED FOR WHEEZE OR SHORTNESS OF BREATH 8.5 each 1   diclofenac Sodium (VOLTAREN) 1 % GEL Apply 1 application. topically 4 (four) times daily as needed (pain).     Erenumab-aooe (AIMOVIG) 140 MG/ML SOAJ Inject 140 mg into the skin every 30 (thirty) days. 3 mL 3   ketoconazole (NIZORAL) 2 % cream Apply 1 Application topically daily. 30 g 0   Melatonin 5 MG CAPS Take 5 mg by mouth at bedtime.     meloxicam (MOBIC) 15 MG tablet Take 1 tablet (15 mg total) by mouth daily. 90 tablet 1   MULTAQ 400 MG tablet TAKE 1 TABLET BY MOUTH TWICE  DAILY WITH A MEAL 200 tablet 2   nitroGLYCERIN (NITROSTAT) 0.4 MG SL tablet DISSOLVE 1 TABLET UNDER THE  TONGUE EVERY 5 MINUTES AS NEEDED FOR CHEST PAIN. MAX OF 3 TABLETS IN 15 MINUTES. CALL 911 IF PAIN  PERSISTS. 50 tablet 7   pantoprazole (PROTONIX) 20 MG tablet Take 1 tablet (20 mg total) by mouth daily before breakfast. 90 tablet 1   pravastatin (PRAVACHOL) 20 MG tablet TAKE 1 TABLET BY MOUTH DAILY 100 tablet 2   SUMAtriptan 6 MG/0.5ML SOAJ INJECT 6 MG INTO THE SKIN DAILY AS NEEDED AT MIGRAINE ONSET. MAY REPEAT 1 TIME AFTER 1 HOUR IF NEEDED 6 mL 11   topiramate (TOPAMAX) 50 MG tablet TAKE 1 TABLET BY MOUTH IN THE  MORNING AND 2 TABLETS BY MOUTH  IN THE EVENING 300 tablet 2   triamcinolone cream (KENALOG) 0.1 % Apply 1 Application topically 2 (two) times daily as needed (rash).     No current facility-administered medications on file prior to visit.    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   Dysrhythmia    bradycardia   HLD (hyperlipidemia)    Low back pain    Migraines    OSA (obstructive sleep apnea) 04/10/2017   Personality disorder (HCC)    Seizures (HCC)     Past Surgical History:  Procedure  Laterality Date   COLONOSCOPY     normal   DRUG INDUCED ENDOSCOPY N/A 03/07/2021   Procedure: DRUG INDUCED SLEEP ENDOSCOPY;  Surgeon: Christia Reading, MD;  Location: [facility];  Service: ENT;  Laterality: N/A;   IMPLANTATION OF HYPOGLOSSAL NERVE STIMULATOR Right 05/16/2021   Procedure: IMPLANTATION OF HYPOGLOSSAL NERVE STIMULATOR;  Surgeon: Christia Reading, MD;  Location: [facility] OR;  Service: ENT;  Laterality: Right;   PATELLA-FEMORAL ARTHROPLASTY Right 12/25/2021   Procedure: RIGHT PATELLOFEMORAL REPLACEMENT;  Surgeon: Marcene Corning, MD;  Location: [facility];  Service: Orthopedics;  Laterality: Right;   TOOTH EXTRACTION  12/27/2014   lower left   TOTAL KNEE ARTHROPLASTY Left 11/28/2020   Procedure: LEFT KNEE PATELLAFEMORL REPLACEMENT;  Surgeon: Marcene Corning, MD;  Location: [facility];  Service: Orthopedics;  Laterality: Left;    Allergies  Allergen Reactions   Delsym [Dextromethorphan] Other (  See Comments)    Gets loopy/memory loss   Aspirin Nausea And Vomiting    GI upset    Mucinex D [Pseudoephedrine-Guaifenesin Er] Hypertension   Ibuprofen Other (See Comments)    Stomach irritation    Family History  Problem Relation Age of Onset   Heart disease Father        defibrilator   Hyperlipidemia Father    Hypertension Father    Alzheimer's disease Father    Other Father        stents in legs   Aneurysm Mother        brain   Hypertension Brother    Stroke Brother        X2    Social History   Socioeconomic History   Marital status: Divorced    Spouse name: Not on file   Number of children: 0   Years of education: Not on file   Highest education level: Not on file  Occupational History   Occupation: disabled  Tobacco Use   Smoking status: Former    Current packs/day: 0.00    Average packs/day: 1 pack/day for 1 year (1.0 ttl pk-yrs)    Types: Cigarettes    Start date: 2    Quit date: 1992    Years since quitting: 32.9    Passive exposure: Past    Smokeless tobacco: Never   Tobacco comments:    Pt stopped smoking in the early 90's  Vaping Use   Vaping status: Never Used  Substance and Sexual Activity   Alcohol use: No   Drug use: No   Sexual activity: Not Currently  Other Topics Concern   Not on file  Social History Narrative   03/16/18: Lives alone with dog, [place] [employer], apartment. Wife of 16 years left him about 2 years ago, and pt. has had difficulty adjusting.   Says his brother Fredrik Cove, who lives in [place], is his main contact, source of support, as well as his dog.    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 States he uses painting as additional therapy.    Social Drivers of Health   Financial Resource Strain: Medium Risk (04/06/2021)   Overall Financial Resource Strain (CARDIA)    Difficulty of Paying Living Expenses: Somewhat hard  Food Insecurity: Food Insecurity Present (04/06/2021)   Hunger Vital Sign    Worried About Running Out of Food in the Last Year: Often true    Ran Out of Food in the Last Year: Sometimes true  Transportation Needs: No Transportation Needs (11/30/2021)   PRAPARE - Administrator, Civil Service (Medical): No    Lack of Transportation (Non-Medical): No  Physical Activity: Sufficiently Active (04/06/2021)   Exercise Vital Sign    Days of Exercise per Week: 3 days    Minutes of Exercise per Session: 120 min  Stress: No Stress Concern Present (04/06/2021)   [employer] of Occupational Health - Occupational Stress Questionnaire    Feeling of Stress : Not at all  Social Connections: Moderately Integrated (04/06/2021)   Social Connection and Isolation Panel [NHANES]    Frequency of Communication with Friends and Family: More than three times a week    Frequency of Social Gatherings with Friends and Family: More than three times a week    Attends Religious Services: More than 4 times per year     Active Member of [employer] or Organizations: Yes  Attends Banker Meetings: More than 4 times per year    Marital Status: Divorced   Today's Vitals   02/12/23 0803  BP: 120/76  Pulse: 76  SpO2: 98%  Weight: 281 lb (127.5 kg)  Height: 6\' 2"  (1.88 m)   Body mass index is 36.08 kg/m. Wt Readings from Last 3 Encounters:  02/12/23 281 lb (127.5 kg)  01/30/23 282 lb 6.4 oz (128.1 kg)  01/13/23 281 lb (127.5 kg)   Physical Exam Vitals and nursing note reviewed.  Constitutional:      General: He is not in acute distress.    Appearance: He is well-developed.  HENT:     Head: Normocephalic and atraumatic.     Right Ear: Tympanic membrane, ear canal and external ear normal.     Left Ear: Tympanic membrane, ear canal and external ear normal.     Mouth/Throat:     Mouth: Mucous membranes are moist.     Pharynx: Oropharynx is clear. Uvula midline.  Eyes:     Extraocular Movements: Extraocular movements intact.     Conjunctiva/sclera: Conjunctivae normal.     Pupils: Pupils are equal, round, and reactive to light.  Neck:     Thyroid: No thyroid mass.  Cardiovascular:     Rate and Rhythm: Normal rate and regular rhythm.     Pulses:          Dorsalis pedis pulses are 2+ on the right side and 2+ on the left side.     Heart sounds: No murmur heard.    Comments: LE's with trace bilateral pitting edema Pulmonary:     Effort: Pulmonary effort is normal. No respiratory distress.     Breath sounds: Normal breath sounds.  Abdominal:     Palpations: Abdomen is soft. There is no hepatomegaly or mass.     Tenderness: There is no abdominal tenderness.  Genitourinary:    Comments: No concerns. Musculoskeletal:        General: No tenderness.     Cervical back: Normal range of motion.     Right lower leg: Pitting Edema present.     Left lower leg: Pitting Edema present.     Comments: No major deformities appreciated and no signs of synovitis.  Lymphadenopathy:     Cervical: No  cervical adenopathy.     Upper Body:     Right upper body: No supraclavicular adenopathy.     Left upper body: No supraclavicular adenopathy.  Skin:    General: Skin is warm.     Findings: No erythema.  Neurological:     General: No focal deficit present.     Mental Status: He is alert and oriented to person, place, and time.     Cranial Nerves: No cranial nerve deficit.     Sensory: No sensory deficit.     Motor: No weakness.     Gait: Gait normal.     Deep Tendon Reflexes:     Reflex Scores:      Bicep reflexes are 2+ on the right side and 2+ on the left side.      Patellar reflexes are 2+ on the right side and 2+ on the left side. Psychiatric:        Mood and Affect: Mood and affect normal.   ASSESSMENT AND PLAN:  Steven Vincent was seen today for his routine general medical examination.   Orders Placed This Encounter  Procedures   PSA   Routine general medical examination at a  health care facility We discussed the importance of regular physical activity and healthy diet for prevention of chronic illness and/or complications. Preventive guidelines reviewed. Vaccination: He needs Tdap, recommend getting it at his pharmacy due to health insurance. Declined flu vaccine. Reports shingrix x 2. Next CPE in a year.  Mixed hyperlipidemia Assessment & Plan: Last LDL 58. Continue pravastatin 20 mg daily. Further recommendations according to FLP result.  PVC (premature ventricular contraction) Assessment & Plan: Stable. Currently on Multaq 400 mg bid. Continue following with cardiologist.  Prostate cancer screening -     PSA; Future  Schizotypal personality disorder [facility]) Assessment & Plan: He has not followed with psychiatrist in a while, dismissed from practice after missing appts. He is not on pharmacologic treatment. Recommend trying to establish with new provider in his insurance network.  Class 2 severe obesity with serious comorbidity and body mass index (BMI) of  36.0 to 36.9 in adult, unspecified obesity type [facility]) Assessment & Plan: He has gained some wt since 07/2022. He understands the benefits of wt loss as well as adverse effects of obesity. Consistency with healthy diet and physical activity encouraged.  Return in 6 months (on 08/13/2023) for chronic problems/pain.  I, Rolla Etienne Wierda, acting as a scribe for Charizma Gardiner [place], MD., have documented all relevant documentation on the behalf of Keller Bounds [place], MD, as directed by  Joice Nazario [place], MD while in the presence of Mylisa Brunson [place], MD.   I, Kelten Enochs [place], MD, have reviewed all documentation for this visit. The documentation on 02/12/23 for the exam, diagnosis, procedures, and orders are all accurate and complete.  Cande Mastropietro G. [place], MD  [facility]. [place] office.

## 2023-02-12 ENCOUNTER — Encounter: Payer: Self-pay | Admitting: Family Medicine

## 2023-02-12 ENCOUNTER — Ambulatory Visit: Payer: 59 | Admitting: Family Medicine

## 2023-02-12 VITALS — BP 120/76 | HR 76 | Ht 74.0 in | Wt 281.0 lb

## 2023-02-12 DIAGNOSIS — F21 Schizotypal disorder: Secondary | ICD-10-CM

## 2023-02-12 DIAGNOSIS — I493 Ventricular premature depolarization: Secondary | ICD-10-CM | POA: Diagnosis not present

## 2023-02-12 DIAGNOSIS — Z0000 Encounter for general adult medical examination without abnormal findings: Secondary | ICD-10-CM

## 2023-02-12 DIAGNOSIS — Z6836 Body mass index (BMI) 36.0-36.9, adult: Secondary | ICD-10-CM

## 2023-02-12 DIAGNOSIS — E782 Mixed hyperlipidemia: Secondary | ICD-10-CM | POA: Diagnosis not present

## 2023-02-12 DIAGNOSIS — Z125 Encounter for screening for malignant neoplasm of prostate: Secondary | ICD-10-CM

## 2023-02-12 DIAGNOSIS — E66812 Obesity, class 2: Secondary | ICD-10-CM

## 2023-02-12 LAB — PSA: PSA: 0.38 ng/mL (ref 0.10–4.00)

## 2023-02-12 NOTE — Assessment & Plan Note (Signed)
He has gained some wt since 07/2022. He understands the benefits of wt loss as well as adverse effects of obesity. Consistency with healthy diet and physical activity encouraged.

## 2023-02-12 NOTE — Patient Instructions (Addendum)
A few things to remember from today's visit:  Routine general medical examination at a health care facility  Mixed hyperlipidemia  PVC (premature ventricular contraction)  Prostate cancer screening - Plan: PSA No changes today. Continue Meloxicam daily as needed. I will see you in 6 months.  If you need refills for medications you take chronically, please call your pharmacy. Do not use My Chart to request refills or for acute issues that need immediate attention. If you send a my chart message, it may take a few days to be addressed, specially if I am not in the office.  Please be sure medication list is accurate. If a new problem present, please set up appointment sooner than planned today.  Health Maintenance, Male Adopting a healthy lifestyle and getting preventive care are important in promoting health and wellness. Ask your health care provider about: The right schedule for you to have regular tests and exams. Things you can do on your own to prevent diseases and keep yourself healthy. What should I know about diet, weight, and exercise? Eat a healthy diet  Eat a diet that includes plenty of vegetables, fruits, low-fat dairy products, and lean protein. Do not eat a lot of foods that are high in solid fats, added sugars, or sodium. Maintain a healthy weight Body mass index (BMI) is a measurement that can be used to identify possible weight problems. It estimates body fat based on height and weight. Your health care provider can help determine your BMI and help you achieve or maintain a healthy weight. Get regular exercise Get regular exercise. This is one of the most important things you can do for your health. Most adults should: Exercise for at least 150 minutes each week. The exercise should increase your heart rate and make you sweat (moderate-intensity exercise). Do strengthening exercises at least twice a week. This is in addition to the moderate-intensity exercise. Spend  less time sitting. Even light physical activity can be beneficial. Watch cholesterol and blood lipids Have your blood tested for lipids and cholesterol at 57 years of age, then have this test every 5 years. You may need to have your cholesterol levels checked more often if: Your lipid or cholesterol levels are high. You are older than 57 years of age. You are at high risk for heart disease. What should I know about cancer screening? Many types of cancers can be detected early and may often be prevented. Depending on your health history and family history, you may need to have cancer screening at various ages. This may include screening for: Colorectal cancer. Prostate cancer. Skin cancer. Lung cancer. What should I know about heart disease, diabetes, and high blood pressure? Blood pressure and heart disease High blood pressure causes heart disease and increases the risk of stroke. This is more likely to develop in people who have high blood pressure readings or are overweight. Talk with your health care provider about your target blood pressure readings. Have your blood pressure checked: Every 3-5 years if you are 62-22 years of age. Every year if you are 76 years old or older. If you are between the ages of 57 and 65 and are a current or former smoker, ask your health care provider if you should have a one-time screening for abdominal aortic aneurysm (AAA). Diabetes Have regular diabetes screenings. This checks your fasting blood sugar level. Have the screening done: Once every three years after age 70 if you are at a normal weight and have a low  risk for diabetes. More often and at a younger age if you are overweight or have a high risk for diabetes. What should I know about preventing infection? Hepatitis B If you have a higher risk for hepatitis B, you should be screened for this virus. Talk with your health care provider to find out if you are at risk for hepatitis B  infection. Hepatitis C Blood testing is recommended for: Everyone born from [birth year] through 1965. Anyone with known risk factors for hepatitis C. Sexually transmitted infections (STIs) You should be screened each year for STIs, including gonorrhea and chlamydia, if: You are sexually active and are younger than 57 years of age. You are older than 57 years of age and your health care provider tells you that you are at risk for this type of infection. Your sexual activity has changed since you were last screened, and you are at increased risk for chlamydia or gonorrhea. Ask your health care provider if you are at risk. Ask your health care provider about whether you are at high risk for HIV. Your health care provider may recommend a prescription medicine to help prevent HIV infection. If you choose to take medicine to prevent HIV, you should first get tested for HIV. You should then be tested every 3 months for as long as you are taking the medicine. Follow these instructions at home: Alcohol use Do not drink alcohol if your health care provider tells you not to drink. If you drink alcohol: Limit how much you have to 0-2 drinks a day. Know how much alcohol is in your drink. In the [place] one drink equals one 12 oz bottle of beer (355 mL), one 5 oz glass of wine (148 mL), or one 1 oz glass of hard liquor (44 mL). Lifestyle Do not use any products that contain nicotine or tobacco. These products include cigarettes, chewing tobacco, and vaping devices, such as e-cigarettes. If you need help quitting, ask your health care provider. Do not use street drugs. Do not share needles. Ask your health care provider for help if you need support or information about quitting drugs. General instructions Schedule regular health, dental, and eye exams. Stay current with your vaccines. Tell your health care provider if: You often feel depressed. You have ever been abused or do not feel safe at  home. Summary Adopting a healthy lifestyle and getting preventive care are important in promoting health and wellness. Follow your health care provider's instructions about healthy diet, exercising, and getting tested or screened for diseases. Follow your health care provider's instructions on monitoring your cholesterol and blood pressure. This information is not intended to replace advice given to you by your health care provider. Make sure you discuss any questions you have with your health care provider. Document Revised: 07/03/2020 Document Reviewed: 07/03/2020 Elsevier Patient Education  2024 [employer].

## 2023-02-12 NOTE — Assessment & Plan Note (Signed)
Stable. Currently on Multaq 400 mg bid. Continue following with cardiologist.

## 2023-02-12 NOTE — Assessment & Plan Note (Signed)
He has not followed with psychiatrist in a while, dismissed from practice after missing appts. He is not on pharmacologic treatment. Recommend trying to establish with new provider in his insurance network.

## 2023-02-12 NOTE — Assessment & Plan Note (Signed)
Last LDL 58. Continue pravastatin 20 mg daily. Further recommendations according to FLP result.

## 2023-02-13 ENCOUNTER — Telehealth: Payer: Self-pay | Admitting: Neurology

## 2023-02-13 DIAGNOSIS — R413 Other amnesia: Secondary | ICD-10-CM

## 2023-02-13 NOTE — Telephone Encounter (Signed)
Copied from CRM [id number]. Topic: Referral - Question >> Feb 13, 2023 11:53 AM Kathryne Eriksson wrote: Patient states he's needing a referral from his primary doctor in order to be seen by a neurologist. Patient is requesting a call back as soon as possible. >> Feb 13, 2023 11:59 AM Kathryne Eriksson wrote: Patient states he's needing a referral from his primary doctor in order to be seen by a neurologist. Patient is requesting a call back as soon as possible.

## 2023-02-13 NOTE — Telephone Encounter (Signed)
Pt has upcoming appt with Maralyn Sago and wanted to know who is Dr. [name] be since he used to see Dr. Anne Hahn. He would like to see a Dr. Next visit if possible he was mentioning memory. I advised he would need a referral to discuss memory and it would be a separate appt. If you can confirm he hasn't been seen for any memory issues related to seizures he will call and get a referral. He would leave Sarah's appt and then get a new one for the memory.

## 2023-02-13 NOTE — Telephone Encounter (Signed)
Called patient back and advised we need a new referral for him, I asked if he has seen his PCP to discuss this and he said no. I recommended he set up an appt with with his PCP to discuss this first as they will want to rule out anything non neurological that could be causing memory impairment prior to referring to [place]. I let him know once we get the referral we will call him to set up a new appointment.

## 2023-02-14 NOTE — Addendum Note (Signed)
Addended by: Kathreen Devoid on: 02/14/2023 07:11 AM   Modules accepted: Orders

## 2023-02-14 NOTE — Telephone Encounter (Signed)
Referral entered  

## 2023-02-24 ENCOUNTER — Ambulatory Visit: Payer: 59 | Admitting: Dermatology

## 2023-02-26 ENCOUNTER — Other Ambulatory Visit: Payer: Self-pay | Admitting: Neurology

## 2023-03-03 ENCOUNTER — Telehealth: Payer: Self-pay | Admitting: Family Medicine

## 2023-03-03 DIAGNOSIS — M5416 Radiculopathy, lumbar region: Secondary | ICD-10-CM | POA: Diagnosis not present

## 2023-03-03 NOTE — Telephone Encounter (Signed)
 Copied from CRM [id number]. Topic: Referral - Status >> Mar 03, 2023  2:53 PM Elizebeth Brooking wrote: Reason for CRM: Patient called in regarding status to the referral, is requesting a callback at [phone]

## 2023-03-03 NOTE — Telephone Encounter (Signed)
 Called pt to advise referral is pending and he will receive a call from [facility] when the process is complete

## 2023-03-10 ENCOUNTER — Ambulatory Visit: Payer: 59 | Admitting: Family Medicine

## 2023-03-10 ENCOUNTER — Ambulatory Visit (INDEPENDENT_AMBULATORY_CARE_PROVIDER_SITE_OTHER): Payer: 59 | Admitting: Family Medicine

## 2023-03-10 ENCOUNTER — Encounter: Payer: Self-pay | Admitting: Family Medicine

## 2023-03-10 VITALS — BP 120/80 | HR 65 | Resp 16 | Ht 74.0 in | Wt 288.0 lb

## 2023-03-10 DIAGNOSIS — F21 Schizotypal disorder: Secondary | ICD-10-CM

## 2023-03-10 DIAGNOSIS — R569 Unspecified convulsions: Secondary | ICD-10-CM

## 2023-03-10 DIAGNOSIS — R413 Other amnesia: Secondary | ICD-10-CM | POA: Diagnosis not present

## 2023-03-10 NOTE — Assessment & Plan Note (Signed)
 Follows with neurologist regularly. He reports that has not had an episode in years. Currently on Topiramate 200 mg daily.

## 2023-03-10 NOTE — Assessment & Plan Note (Addendum)
 He reports that this problem has been ruled out. He reports some anxiety but denies any other psychiatrics disorder. He is not longer following with psychiatrist.

## 2023-03-10 NOTE — Progress Notes (Signed)
 ACUTE VISIT Chief Complaint  Patient presents with   Memory Loss   HPI: Mr.Adonnis Jaceon Heiberger is a 58 y.o. male with a PMHx significant for HLD, hearing loss, migraine headaches, OSA, RLS, GERD, schizotypal personality disorder, and PVCs, who is here today complaining of memory issues.   Patient states he had a [employer] visit where they noticed some neurologic concerns, including struggling to remember a list of words and having difficulty putting on socks and shoes. He cannot explain what type of difficulty he was having when trying to put on shoes and soaks. Negative for focal weakness. Mentions that he has hx of knee surgery and chronic back pain.  He also states he will occasional forget where he is going while driving, and has struggled to remember names for many years.   He is still able to keep up with his finances independently, remember to pay his bills. When asked about ability of learning new skills, states that he already has the knowledge he needs to perform any job.  He has a history of seizures, psychiatric issues, migraines, and sleep apnea, and his dad had Alzheimer's.  He last saw neurology 05/08/2022. Pertinent negatives include unusual headache, numbness, tingling, or hallucinations. He has not had seizures in years. Currently on Topamax  200 mg daily.  He is no longer seeing psychiatry because he missed a few appointments.  He paints to help manage his stress.  Denies hx of schizophrenic personality. He states that he was told he did not have this problem instead he was dx'ed with intellectual disability. He denies hallucinations.  Review of Systems  Constitutional:  Negative for activity change, appetite change, chills and fever.  HENT:  Negative for mouth sores and nosebleeds.   Respiratory:  Negative for cough, shortness of breath and wheezing.   Gastrointestinal:  Negative for abdominal pain, nausea and vomiting.  Endocrine: Negative for  cold intolerance and heat intolerance.  Genitourinary:  Negative for decreased urine volume, dysuria and hematuria.  Musculoskeletal:  Positive for arthralgias and back pain. Negative for gait problem.  Skin:  Negative for rash.  Neurological:  Negative for syncope and facial asymmetry.  Psychiatric/Behavioral:  Negative for confusion. The patient is nervous/anxious.   See other pertinent positives and negatives in HPI.  Current Outpatient Medications on File Prior to Visit  Medication Sig Dispense Refill   albuterol  (VENTOLIN  HFA) 108 (90 Base) MCG/ACT inhaler TAKE 2 PUFFS BY MOUTH EVERY 6 HOURS AS NEEDED FOR WHEEZE OR SHORTNESS OF BREATH 8.5 each 1   diclofenac  Sodium (VOLTAREN ) 1 % GEL Apply 1 application. topically 4 (four) times daily as needed (pain).     Erenumab -aooe (AIMOVIG ) 140 MG/ML SOAJ Inject 140 mg into the skin every 30 (thirty) days. 3 mL 3   ketoconazole  (NIZORAL ) 2 % cream Apply 1 Application topically daily. 30 g 0   Melatonin 5 MG CAPS Take 5 mg by mouth at bedtime.     meloxicam  (MOBIC ) 15 MG tablet Take 1 tablet (15 mg total) by mouth daily. 90 tablet 1   MULTAQ  400 MG tablet TAKE 1 TABLET BY MOUTH TWICE  DAILY WITH A MEAL 200 tablet 2   nitroGLYCERIN  (NITROSTAT ) 0.4 MG SL tablet DISSOLVE 1 TABLET UNDER THE  TONGUE EVERY 5 MINUTES AS NEEDED FOR CHEST PAIN. MAX OF 3 TABLETS IN 15 MINUTES. CALL 911 IF PAIN  PERSISTS. 50 tablet 7   pantoprazole  (PROTONIX ) 20 MG tablet Take 1 tablet (20 mg total) by mouth daily before  breakfast. 90 tablet 1   pravastatin  (PRAVACHOL ) 20 MG tablet TAKE 1 TABLET BY MOUTH DAILY 100 tablet 2   SUMAtriptan  6 MG/0.5ML SOAJ INJECT 6 MG INTO THE SKIN DAILY AS NEEDED AT MIGRAINE ONSET. MAY REPEAT 1 TIME AFTER 1 HOUR IF NEEDED 6 mL 11   topiramate  (TOPAMAX ) 50 MG tablet TAKE 1 TABLET BY MOUTH IN THE  MORNING AND 2 TABLETS BY MOUTH  IN THE EVENING 300 tablet 2   triamcinolone  cream (KENALOG ) 0.1 % Apply 1 Application topically 2 (two) times daily as  needed (rash).     No current facility-administered medications on file prior to visit.    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   Dysrhythmia    bradycardia   HLD (hyperlipidemia)    Low back pain    Migraines    OSA (obstructive sleep apnea) 04/10/2017   Personality disorder (HCC)    Seizures (HCC)    Allergies  Allergen Reactions   Delsym [Dextromethorphan] Other (See Comments)    Gets loopy/memory loss   Aspirin  Nausea And Vomiting    GI upset    Mucinex  D [Pseudoephedrine-Guaifenesin  Er] Hypertension   Ibuprofen  Other (See Comments)    Stomach irritation    Social History   Socioeconomic History   Marital status: Divorced    Spouse name: Not on file   Number of children: 0   Years of education: Not on file   Highest education level: Not on file  Occupational History   Occupation: disabled  Tobacco Use   Smoking status: Former    Current packs/day: 0.00    Average packs/day: 1 pack/day for 1 year (1.0 ttl pk-yrs)    Types: Cigarettes    Start date: 16    Quit date: 54    Years since quitting: 33.0    Passive exposure: Past   Smokeless tobacco: Never   Tobacco comments:    Pt stopped smoking in the early 90's  Vaping Use   Vaping status: Never Used  Substance and Sexual Activity   Alcohol use: No   Drug use: No   Sexual activity: Not Currently  Other Topics Concern   Not on file  Social History Narrative   03/16/18: Lives alone with dog, Verlie KATHEE Chow, apartment. Wife of 16 years left him about 2 years ago, and pt. has had difficulty adjusting.   Says his brother Sharolyn, who lives in [place], is his main contact, source of support, as well as his dog.    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 States he uses painting as additional therapy.    Social Drivers of Health   Financial Resource  Strain: Medium Risk (04/06/2021)   Overall Financial Resource Strain (CARDIA)    Difficulty of Paying Living Expenses: Somewhat hard  Food Insecurity: Food Insecurity Present (04/06/2021)   Hunger Vital Sign    Worried About Running Out of Food in the Last Year: Often true    Ran Out of Food in the Last Year: Sometimes true  Transportation Needs: No Transportation Needs (11/30/2021)   PRAPARE - Administrator, Civil Service (Medical): No    Lack of Transportation (Non-Medical): No  Physical Activity: Sufficiently Active (04/06/2021)   Exercise Vital Sign    Days of Exercise per Week: 3 days  Minutes of Exercise per Session: 120 min  Stress: No Stress Concern Present (04/06/2021)   [employer] of Occupational Health - Occupational Stress Questionnaire    Feeling of Stress : Not at all  Social Connections: Moderately Integrated (04/06/2021)   Social Connection and Isolation Panel [NHANES]    Frequency of Communication with Friends and Family: More than three times a week    Frequency of Social Gatherings with Friends and Family: More than three times a week    Attends Religious Services: More than 4 times per year    Active Member of [employer] or Organizations: Yes    Attends Banker Meetings: More than 4 times per year    Marital Status: Divorced   Vitals:   03/10/23 1525  BP: 120/80  Pulse: 65  Resp: 16  SpO2: 97%   Wt Readings from Last 3 Encounters:  03/10/23 288 lb (130.6 kg)  02/12/23 281 lb (127.5 kg)  01/30/23 282 lb 6.4 oz (128.1 kg)   Body mass index is 36.98 kg/m.  Physical Exam Vitals and nursing note reviewed.  Constitutional:      General: He is not in acute distress.    Appearance: He is well-developed.  HENT:     Head: Normocephalic and atraumatic.  Eyes:     Conjunctiva/sclera: Conjunctivae normal.  Cardiovascular:     Rate and Rhythm: Normal rate and regular rhythm.     Heart sounds: No murmur heard. Pulmonary:     Effort:  Pulmonary effort is normal. No respiratory distress.     Breath sounds: Normal breath sounds.  Abdominal:     Palpations: Abdomen is soft. There is no mass.     Tenderness: There is no abdominal tenderness.  Musculoskeletal:     Right lower leg: 1+ Edema present.     Left lower leg: 1+ Edema present.  Lymphadenopathy:     Cervical: No cervical adenopathy.  Skin:    General: Skin is warm.     Findings: No erythema or rash.  Neurological:     General: No focal deficit present.     Mental Status: He is alert and oriented to person, place, and time.     Cranial Nerves: No cranial nerve deficit.     Motor: No tremor.     Gait: Gait normal.     Comments: When asked, he correctly identifies the day of the week, month, and year, but is off on the numerical date by one day.   Psychiatric:        Mood and Affect: Affect normal. Mood is anxious.   ASSESSMENT AND PLAN:  Mr. Mcqueen was seen today for memory issues  Lab Results  Component Value Date   VITAMINB12 400 03/10/2023   Lab Results  Component Value Date   NA 140 03/10/2023   CL 110 03/10/2023   K 4.1 03/10/2023   CO2 22 03/10/2023   BUN 17 03/10/2023   CREATININE 1.07 03/10/2023   GFR 76.99 03/10/2023   CALCIUM 8.9 03/10/2023   ALBUMIN 4.2 03/10/2023   GLUCOSE 114 (H) 03/10/2023   Lab Results  Component Value Date   ALT 37 03/10/2023   AST 27 03/10/2023   ALKPHOS 60 03/10/2023   BILITOT 0.4 03/10/2023   Lab Results  Component Value Date   TSH 4.89 03/10/2023   Lab Results  Component Value Date   WBC 8.5 03/10/2023   HGB 15.6 03/10/2023   HCT 45.3 03/10/2023   MCV 95.7 03/10/2023  PLT 207.0 03/10/2023   Memory difficulties We discussed possible etiologies. Some of his co morbilities could be contributing factors. He follows with neurologist regularly. We will try to arrange neuropsychologic evaluation.  Mini-Cog - 03/10/23 1555     Normal clock drawing test? yes    How many words correct? 3   2 attemps            Further recommendations according to lab results.  -     Ambulatory referral to Neuropsychology -     Hepatic function panel; Future -     TSH; Future -     Vitamin B12; Future -     Basic metabolic panel; Future -     CBC; Future  Schizotypal personality disorder [facility]) Assessment & Plan: He reports that this problem has been ruled out. He reports some anxiety but denies any other psychiatrics disorder. He is not longer following with psychiatrist.  Seizures [facility]) Assessment & Plan: Follows with neurologist regularly. He reports that has not had an episode in years. Currently on Topiramate  200 mg daily.   Return if symptoms worsen or fail to improve, for keep next appointment.  I, Leonce PARAS Wierda, acting as a scribe for Mliss Wedin, MD., have documented all relevant documentation on the behalf of Trinidi Toppins, MD, as directed by  Earnestine Shipp, MD while in the presence of Mandel Seiden, MD.   I, Gerene Nedd, MD, have reviewed all documentation for this visit. The documentation on 03/10/23 for the exam, diagnosis, procedures, and orders are all accurate and complete.  Grove Defina G. Halli Equihua, MD  [facility]. [place] office.

## 2023-03-10 NOTE — Patient Instructions (Addendum)
 A few things to remember from today's visit:  Memory difficulties - Plan: Ambulatory referral to Neuropsychology, Hepatic function panel, TSH, Vitamin B12, Basic metabolic panel, CBC  Hemiballismus  Keep next appt with neurologist. Try challengeMemory Compensation Strategies  Use WARM strategy.  W= write it down  A= associate it  R= repeat it  M= make a mental note  2.   You can keep a Glass Blower/designer.  Use a 3-ring notebook with sections for the following: calendar, important names and phone numbers,  medications, doctors' names/phone numbers, lists/reminders, and a section to journal what you did  each day.   3.    Use a calendar to write appointments down.  4.    Write yourself a schedule for the day.  This can be placed on the calendar or in a separate section of the Memory Notebook.  Keeping a  regular schedule can help memory.  5.    Use medication organizer with sections for each day or morning/evening pills.  You may need help loading it  6.    Keep a basket, or pegboard by the door.  Place items that you need to take out with you in the basket or on the pegboard.  You may also want to  include a message board for reminders.  7.    Use sticky notes.  Place sticky notes with reminders in a place where the task is performed.  For example:  turn off the  stove placed by the stove, lock the door placed on the door at eye level,  take your medications on  the bathroom mirror or by the place where you normally take your medications.  8.    Use alarms/timers.  Use while cooking to remind yourself to check on food or as a reminder to take your medicine, or as a  reminder to make a call, or as a reminder to perform another task, etc.  memory games.  If you need refills for medications you take chronically, please call your pharmacy. Do not use My Chart to request refills or for acute issues that need immediate attention. If you send a my chart message, it may take a few  days to be addressed, specially if I am not in the office.  Please be sure medication list is accurate. If a new problem present, please set up appointment sooner than planned today.

## 2023-03-11 LAB — HEPATIC FUNCTION PANEL
ALT: 37 U/L (ref 0–53)
AST: 27 U/L (ref 0–37)
Albumin: 4.2 g/dL (ref 3.5–5.2)
Alkaline Phosphatase: 60 U/L (ref 39–117)
Bilirubin, Direct: 0.1 mg/dL (ref 0.0–0.3)
Total Bilirubin: 0.4 mg/dL (ref 0.2–1.2)
Total Protein: 6.9 g/dL (ref 6.0–8.3)

## 2023-03-11 LAB — BASIC METABOLIC PANEL
BUN: 17 mg/dL (ref 6–23)
CO2: 22 meq/L (ref 19–32)
Calcium: 8.9 mg/dL (ref 8.4–10.5)
Chloride: 110 meq/L (ref 96–112)
Creatinine, Ser: 1.07 mg/dL (ref 0.40–1.50)
GFR: 76.99 mL/min (ref >60.00)
Glucose, Bld: 114 mg/dL — ABNORMAL HIGH (ref 70–99)
Potassium: 4.1 meq/L (ref 3.5–5.1)
Sodium: 140 meq/L (ref 135–145)

## 2023-03-11 LAB — CBC
HCT: 45.3 % (ref 39.0–52.0)
Hemoglobin: 15.6 g/dL (ref 13.0–17.0)
MCHC: 34.4 g/dL (ref 30.0–36.0)
MCV: 95.7 fL (ref 78.0–100.0)
Platelets: 207 10*3/uL (ref 150.0–400.0)
RBC: 4.73 Mil/uL (ref 4.22–5.81)
RDW: 13.5 % (ref 11.5–15.5)
WBC: 8.5 10*3/uL (ref 4.0–10.5)

## 2023-03-11 LAB — TSH: TSH: 4.89 u[IU]/mL (ref 0.35–5.50)

## 2023-03-11 LAB — VITAMIN B12: Vitamin B-12: 400 pg/mL (ref 211–911)

## 2023-03-25 ENCOUNTER — Encounter: Payer: Self-pay | Admitting: Dermatology

## 2023-03-25 ENCOUNTER — Ambulatory Visit: Payer: 59 | Admitting: Dermatology

## 2023-03-25 VITALS — BP 141/92 | HR 55

## 2023-03-25 DIAGNOSIS — D492 Neoplasm of unspecified behavior of bone, soft tissue, and skin: Secondary | ICD-10-CM | POA: Diagnosis not present

## 2023-03-25 DIAGNOSIS — D485 Neoplasm of uncertain behavior of skin: Secondary | ICD-10-CM

## 2023-03-25 DIAGNOSIS — L570 Actinic keratosis: Secondary | ICD-10-CM | POA: Diagnosis not present

## 2023-03-25 DIAGNOSIS — D2272 Melanocytic nevi of left lower limb, including hip: Secondary | ICD-10-CM | POA: Diagnosis not present

## 2023-03-25 DIAGNOSIS — D2372 Other benign neoplasm of skin of left lower limb, including hip: Secondary | ICD-10-CM | POA: Diagnosis not present

## 2023-03-25 DIAGNOSIS — D229 Melanocytic nevi, unspecified: Secondary | ICD-10-CM

## 2023-03-25 DIAGNOSIS — L821 Other seborrheic keratosis: Secondary | ICD-10-CM | POA: Diagnosis not present

## 2023-03-25 NOTE — Patient Instructions (Addendum)

## 2023-03-25 NOTE — Progress Notes (Signed)
   Follow-Up Visit   Subjective  Steven Vincent is a 58 y.o. male who presents for the following: AK F/U  Patient present today for follow up visit for Aks. Patient was last evaluated on 08/20/22. Patient reports sxs are better. Patient denies medication changes.  The following portions of the chart were reviewed this encounter and updated as appropriate: medications, allergies, medical history  Review of Systems:  No other skin or systemic complaints except as noted in HPI or Assessment and Plan.  Objective  Well appearing patient in no apparent distress; mood and affect are within normal limits.  A focused examination was performed of the following areas: B/L Ears  Relevant exam findings are noted in the Assessment and Plan.       Left Superior Helix, Left Temple (2), Right Mid Helix Erythematous thin papules/macules with gritty scale.  Left Foot - Anterior 5 mm crusted papule  Assessment & Plan   SEBORRHEIC KERATOSIS - Stuck-on, waxy, tan-brown papules and/or plaques  - Benign-appearing - Discussed benign etiology and prognosis. - Observe - Call for any changes  MELANOCYTIC NEVI Exam: 3 mm Tan-brown symmetric papules Left Foot  Treatment Plan: Benign appearing on exam today. Recommend observation. Call clinic for new or changing moles. Recommend daily use of broad spectrum spf 30+ sunscreen to sun-exposed areas.   Suspicious Lesion on Left Dorsal Foot Assessment: Patient reports a concerning spot on the left dorsal foot that crusts, bleeds, and doesn't heal. Physical examination shows a 5-millimeter pink-brown crusted papule. Differential diagnosis includes basal cell carcinoma versus squamous cell carcinoma.  Plan: Perform shave biopsy of the lesion. Instruct patient to apply Aquaphor  to the biopsy site for 2 weeks.    Schedule a follow-up in 6 months for a full-body skin cancer screening. AK (ACTINIC KERATOSIS) (4) Left Superior Helix, Left Temple (2), Right  Mid Helix Destruction of lesion - Right Mid Helix  Destruction method: cryotherapy   Informed consent: discussed and consent obtained   Timeout:  patient name, date of birth, surgical site, and procedure verified Lesion destroyed using liquid nitrogen: Yes   Cryotherapy cycles:  4 Outcome: patient tolerated procedure well with no complications   NEOPLASM OF UNCERTAIN BEHAVIOR OF SKIN Left Foot - Anterior Skin / nail biopsy Type of biopsy: tangential   Informed consent: discussed and consent obtained   Timeout: patient name, date of birth, surgical site, and procedure verified   Procedure prep:  Patient was prepped and draped in usual sterile fashion Prep type:  Isopropyl alcohol Anesthesia: the lesion was anesthetized in a standard fashion   Anesthetic:  1% lidocaine w/ epinephrine 1-100,000 buffered w/ 8.4% NaHCO3 Instrument used: DermaBlade   Hemostasis achieved with: aluminum chloride   Outcome: patient tolerated procedure well   Post-procedure details: sterile dressing applied and wound care instructions given   Dressing type: petrolatum gauze and bandage   Specimen A - Surgical pathology Differential Diagnosis: BCC vs SCC  Check Margins: No  Return in about 7 months (around 10/23/2023) for TBSE.  Documentation: I have reviewed the above documentation for accuracy and completeness, and I agree with the above.  Stasia Cavalier, am acting as scribe for Langston Reusing, DO.  Langston Reusing, DO

## 2023-03-26 ENCOUNTER — Telehealth: Payer: Self-pay | Admitting: Cardiology

## 2023-03-26 ENCOUNTER — Telehealth: Payer: Self-pay | Admitting: Neurology

## 2023-03-26 DIAGNOSIS — K219 Gastro-esophageal reflux disease without esophagitis: Secondary | ICD-10-CM

## 2023-03-26 LAB — SURGICAL PATHOLOGY

## 2023-03-26 MED ORDER — PANTOPRAZOLE SODIUM 20 MG PO TBEC: 20.0000 mg | DELAYED_RELEASE_TABLET | Freq: Every day | ORAL | 1 refills | Status: DC

## 2023-03-26 NOTE — Telephone Encounter (Signed)
Refills has been sent to the pharmacy.

## 2023-03-26 NOTE — Telephone Encounter (Signed)
Call to [facility], spoke with Montez Morita, he states the topamax was shipped today.   Call to patient and advised medication was on the way. He was appreciative.

## 2023-03-26 NOTE — Telephone Encounter (Signed)
*  STAT* If patient is at the pharmacy, call can be transferred to refill team.   1. Which medications need to be refilled? (please list name of each medication and dose if known)   pravastatin (PRAVACHOL) 20 MG tablet   2. Which pharmacy/location (including street and city if local pharmacy) is medication to be sent to? [facility] - [place], [place] - [address]   3. Do they need a 30 day or 90 day supply? 90

## 2023-03-26 NOTE — Telephone Encounter (Signed)
Pt called stating that his pharmacy informed him that they will not refill his  topiramate (TOPAMAX) 50 MG tablet and he is wanting to know why he can not get his medication. Please advise.

## 2023-03-27 ENCOUNTER — Encounter: Payer: Self-pay | Admitting: Dermatology

## 2023-04-01 ENCOUNTER — Encounter: Payer: Self-pay | Admitting: Neurology

## 2023-04-01 ENCOUNTER — Ambulatory Visit (INDEPENDENT_AMBULATORY_CARE_PROVIDER_SITE_OTHER): Payer: 59 | Admitting: Neurology

## 2023-04-01 VITALS — BP 117/67 | HR 58 | Ht 74.0 in | Wt 287.6 lb

## 2023-04-01 DIAGNOSIS — F79 Unspecified intellectual disabilities: Secondary | ICD-10-CM | POA: Insufficient documentation

## 2023-04-01 DIAGNOSIS — R4183 Borderline intellectual functioning: Secondary | ICD-10-CM | POA: Insufficient documentation

## 2023-04-01 DIAGNOSIS — G43009 Migraine without aura, not intractable, without status migrainosus: Secondary | ICD-10-CM | POA: Diagnosis not present

## 2023-04-01 DIAGNOSIS — R569 Unspecified convulsions: Secondary | ICD-10-CM | POA: Diagnosis not present

## 2023-04-01 MED ORDER — TOPIRAMATE 50 MG PO TABS: ORAL_TABLET | ORAL | 2 refills | Status: DC
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
## 2023-04-01 NOTE — Patient Instructions (Signed)
 Intellectual Disability Intellectual disability (ID) is a defect in a person's mental abilities and behavior. It begins before age 58 and lasts for a lifetime. Intellectual disability used to be called mental retardation. That term is no longer used. Having an intellectual disability means that a person is limited in the ability to: Learn new skills, solve problems, and make certain decisions (intellectual functioning). Function well in daily life (adaptive functioning). People with an ID may have limited physical abilities (developmental disability). ID may also occur with many other conditions, including Down syndrome, cerebral palsy, epilepsy, attention-deficit hyperactivity disorder (ADHD), autism, depression, and anxiety. ID can range from mild to severe. It is more common in boys than in girls. Most children with ID can learn to do many things, though it may take them longer to do so. Adults with ID can lead partially independent lives. What are the causes? There are many possible causes of this condition. It can develop: Before birth. Causes can include: Changes in the genes (genetic mutations). Problems during pregnancy, like the mother's illness or drug use. Exposure of the mother to certain poisons (toxins). During labor and delivery. This can be caused by: Lack of oxygen . Infection. Failure to diagnose fetal or maternal distress. These are problems that occur in the baby or mother during delivery. Umbilical cord problems. Before age 32. This can be caused by: Head injury or stroke. Infections, such as meningitis, whooping cough (pertussis), and measles. Exposure to toxins, such as mercury or lead. In some cases, the cause of this condition is not known. What are the signs or symptoms? Signs and symptoms of ID can vary from person to person and may depend on the age of the child. Some symptoms may not be seen until the child is older. For babies, symptoms may include delays in their  ability to sit, crawl, walk, or talk. For older children, symptoms may include: Inability to dress, eat, or do other activities or chores of daily life. Problems at school or work. Difficulty solving problems, controlling behavior, or maintaining social relationships. Difficulty remembering and following rules or understanding the results of their actions or decisions. How is this diagnosed? ID is diagnosed by health care providers who specialize in intellectual disability. The health care provider will: Talk to the parents, other family members, and the child. If the child is school age, the health care provider may also get reports from teachers. Give exams and tests. This may include a test to measure how well the child thinks and solves problems (IQ test). An IQ score of 70 to 75 may indicate an intellectual disability. Other tests may be given to confirm the diagnosis. These may include: Tests in language, reading, writing, math, reasoning, or memory. Tests to check social skills, such as judgment, independence, relationships, ability to follow rules, and ability to control behavior. Tests to check the child's ability to solve problems and do daily tasks of life. These include personal care, managing money, and job or school performance. How is this treated? There is no cure for this condition. Treatment for ID focuses on services and support to encourage independence. Early intervention to diagnose and treat the condition is guaranteed by federal law under the Individuals with Disabilities Education Act (IDEA). The level of services and support that are needed may depend on your child's symptoms and how severe they are. Treatment works best when started early and continued throughout life. Treatments may include: Infant and early childhood services. Special education. Job or school coaching. Family  support. Vocational training. Day programs. Care manager support services. Occupational  therapy. Special housing options, such as group homes and special needs foster care. Follow these instructions at home: Learn as much as you can about intellectual disability. Ask other family members such as aunts, uncles, siblings, or grandparents to take part in learning about this condition. Give over-the-counter and prescription medicines only as told by your child's health care provider. Find support from federal and community services. As a caregiver, think about how you can meet the needs of your child. If you need support, try to find other resources that may better meet your child's needs. Work with a developmental pediatrician to identify your child's needs and to plan the best treatment for his or her condition. Keep all follow-up visits as told by your health care provider. This is important. Where to find more information American Association on Intellectual and Developmental Disabilities: aaidd.org Center for Parent Information & Resources: failfactory.se Family Voices: Call (570)718-2180 or visit familyvoices.org Individuals with Disabilities Education Act: https://avila-olson.com/ Contact a health care provider if: You have questions about intellectual disability. You need more help or support to manage your child's condition. You are concerned that your child may harm himself or herself or others. You are feeling down and discouraged, and you feel that you need help yourself. Get help right away if: Your child becomes aggressive or violent toward himself or herself, you, or others. If you ever feel like your child may hurt himself or herself or others, or shares thoughts about taking his or her own life, get help right away. You can go to your nearest emergency department or call: Your local emergency services (911 in the [place] A suicide crisis helpline, such as the National Suicide Prevention Lifeline at 667-794-6558 or 988 in the [place] This is open 24 hours a  day. Summary Intellectual disability (ID) is a defect in a person's mental abilities and behavior. It begins before age 1 and lasts for a lifetime. There are many causes of ID. In some cases, the cause is not known. There is no cure for this condition. Treatment for ID focuses on services and support to encourage independence. There are resources to help you and your child. Use them if available. Get help right away if you ever feel like your child may harm himself or herself or others. Go to the nearest emergency department or call your local emergency services. This information is not intended to replace advice given to you by your health care provider. Make sure you discuss any questions you have with your health care provider. Document Revised: 01/07/2022 Document Reviewed: 01/28/2022 Elsevier Patient Education  2024 [employer].

## 2023-04-08 ENCOUNTER — Telehealth: Payer: Self-pay | Admitting: Neurology

## 2023-04-08 NOTE — Telephone Encounter (Signed)
[employer] medicare/[place] medicaid NPR sent to [employer] since he has an inspire device. [phone]

## 2023-04-10 ENCOUNTER — Ambulatory Visit (HOSPITAL_COMMUNITY)
Admission: RE | Admit: 2023-04-10 | Discharge: 2023-04-10 | Disposition: A | Discharge status: Home / Self Care | Payer: 59 | Source: Ambulatory Visit | Attending: Neurology | Admitting: Neurology

## 2023-04-10 ENCOUNTER — Encounter (HOSPITAL_BASED_OUTPATIENT_CLINIC_OR_DEPARTMENT_OTHER): Payer: Self-pay | Admitting: Pulmonary Disease

## 2023-04-10 ENCOUNTER — Ambulatory Visit (HOSPITAL_BASED_OUTPATIENT_CLINIC_OR_DEPARTMENT_OTHER): Payer: 59 | Admitting: Pulmonary Disease

## 2023-04-10 VITALS — BP 132/84 | HR 66 | Ht 74.0 in | Wt 287.3 lb

## 2023-04-10 DIAGNOSIS — R4183 Borderline intellectual functioning: Secondary | ICD-10-CM | POA: Insufficient documentation

## 2023-04-10 DIAGNOSIS — Z8673 Personal history of transient ischemic attack (TIA), and cerebral infarction without residual deficits: Secondary | ICD-10-CM | POA: Diagnosis not present

## 2023-04-10 DIAGNOSIS — R9089 Other abnormal findings on diagnostic imaging of central nervous system: Secondary | ICD-10-CM | POA: Diagnosis not present

## 2023-04-10 DIAGNOSIS — G4733 Obstructive sleep apnea (adult) (pediatric): Secondary | ICD-10-CM

## 2023-04-10 DIAGNOSIS — J320 Chronic maxillary sinusitis: Secondary | ICD-10-CM | POA: Diagnosis not present

## 2023-04-10 DIAGNOSIS — G931 Anoxic brain damage, not elsewhere classified: Secondary | ICD-10-CM | POA: Diagnosis not present

## 2023-04-10 MED ORDER — GADOBUTROL 1 MMOL/ML IV SOLN
10.0000 mL | Freq: Once | INTRAVENOUS | Status: AC | PRN
  Administered 2023-04-10: 10 mL via INTRAVENOUS

## 2023-04-10 NOTE — Patient Instructions (Signed)
  You are doing well on inspire Try to use every night  Home sleep study

## 2023-04-10 NOTE — Progress Notes (Unsigned)
   Subjective:    Patient ID: Steven Vincent, male    DOB: 06-Jul-1965, 58 y.o.   MRN: [id number]  HPI  58 yo male for FU of OSA and Insomnia (CPAP intolerant) s/p Inspire device implant (05/16/21)  PMH : seizure disorder Patient is a Cytogeneticist, gold Gregor Hams, Anise Salvo is [place].  (Patient has some intellectual disability reads and writes at a third grade level.    His device was activated in 2023, he was seen 05/2022 and noted to be set with a range of 0.7 to 1.1 V with amplitude of 0.9 06/2022 amplitude was 0.8 V  Start delay 30 minutes, pause time 20 minutes duration 8 hours  12/2022 at level 2 = 0.8V >> overstimulated at this level with tongue deviation to the left  Optimal level was 0.6= level 2.  Changed to 0.5 V lower limit, upper limit 0.9 V   Start delay  to 30 minutes, pause time 25 minutes, duration 8hours  He arrives today for 9-month follow-up.  Current amplitude is 0.6 V.  He reports sleepiness in the daytime and takes frequent naps.  He is still snoring nightly.  H he has a delayed bedtime sometimes up to 2 AM Download was reviewed and his average usage is close to 7 hours, does not use pause but much    Significant tests/ events reviewed       PSG 08/15/21 >> optimal level 0.9 V with AHI/hour, incoming amplitude was 0.7, range study was 0.5 to 0.9 V PSG 02/15/17 >> AHI 6.9, SpO2 low 85%.  Supine AHI 10.5, REM AHI 24.3. HST 02/26/18 >> AHI 11.6, SpO2 low 79% HST 07/07/18 >> AHI 7.7, SpO2 low 83% Auto CPAP 11/20/19 to 12/19/19 >> used on 12 of 30 nights with average 2 hrs 9 min.  Average AHI 4.3 with median CPAP 11 and 95 th percentile CPAP 14 cm H2O CPAP titration 01/31/20 >> CPAP 13 >> AHI 0, no REM.  Review of Systems neg for any significant sore throat, dysphagia, itching, sneezing, nasal congestion or excess/ purulent secretions, fever, chills, sweats, unintended wt loss, pleuritic or exertional cp, hempoptysis, orthopnea pnd or change in chronic leg  swelling. Also denies presyncope, palpitations, heartburn, abdominal pain, nausea, vomiting, diarrhea or change in bowel or urinary habits, dysuria,hematuria, rash, arthralgias, visual complaints, headache, numbness weakness or ataxia.     Objective:   Physical Exam  Gen. Pleasant, obese, in no distress ENT - no lesions, no post nasal drip Neck: No JVD, no thyromegaly, no carotid bruits Lungs: no use of accessory muscles, no dullness to percussion, decreased without rales or rhonchi  Cardiovascular: Rhythm regular, heart sounds  normal, no murmurs or gallops, no peripheral edema Musculoskeletal: No deformities, no cyanosis or clubbing , no tremors       Assessment & Plan:    OSA : Tongue motion was noted today with programmer.  On further increase of levels he has tongue deviation to the left.  We will continue at 0.6 V.  He is maintained at a range of 0.5 to 0.9 V.  We will repeat a home sleep test at this level  Weight loss encouraged, compliance with goal of at least 6 hrs every night is the expectation. Advised against medications with sedative side effects Cautioned against driving when sleepy - understanding that sleepiness will vary on a day to day basis

## 2023-04-14 ENCOUNTER — Telehealth: Payer: Self-pay

## 2023-04-14 ENCOUNTER — Ambulatory Visit: Payer: 59 | Admitting: Family Medicine

## 2023-04-14 DIAGNOSIS — R21 Rash and other nonspecific skin eruption: Secondary | ICD-10-CM | POA: Diagnosis not present

## 2023-04-14 NOTE — Telephone Encounter (Signed)
Spoke with pt about results. He said he is currently at urgent care because site became very red and irritated. I told him it sounded like a reaction to the adhesive but to keep [place] updated.

## 2023-04-14 NOTE — Telephone Encounter (Signed)
-----   Message from Waldemar Dickens sent at 04/14/2023  1:07 PM EST ----- Can someone call this patient. Requesting a call in regard to a biopsy result.  Thank you Byrd Hesselbach

## 2023-04-15 ENCOUNTER — Ambulatory Visit: Payer: 59

## 2023-04-15 DIAGNOSIS — G4733 Obstructive sleep apnea (adult) (pediatric): Secondary | ICD-10-CM

## 2023-04-22 ENCOUNTER — Other Ambulatory Visit: Payer: Self-pay | Admitting: Neurology

## 2023-04-28 ENCOUNTER — Other Ambulatory Visit: Payer: Self-pay | Admitting: Neurology

## 2023-04-28 ENCOUNTER — Telehealth: Payer: Self-pay | Admitting: Neurology

## 2023-04-28 ENCOUNTER — Encounter: Payer: Self-pay | Admitting: Neurology

## 2023-04-28 DIAGNOSIS — M25532 Pain in left wrist: Secondary | ICD-10-CM | POA: Diagnosis not present

## 2023-04-28 NOTE — Telephone Encounter (Signed)
-----   Message from [place] Dohmeier sent at 04/28/2023  3:28 PM EST ----- Maxillary sinusitis right more than left. 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   No acute findings. No recent brain injuries.

## 2023-04-28 NOTE — Telephone Encounter (Signed)
 Called the pt back. Advised that Dr Vickey Huger did review the MRI and I was able to review the information with him. Pt states that his right hand is not operating normally. He went and saw orthopedic today and states that they are going to do more testing. He did mention this is a new symptoms and was not like this at the time he had the MRI completed. Advised I would make sure MD aware in case she is concerned. He follows up with orthopedic 3/24.  Pt discussed memory concerning and the MRI did show old stroke and he is unsure when that happened. He has never been told he had a stroke. Advised that we could have the pt come in sooner to repeat memory testing and follow up and determine if memory meds should be started. Pt verbalized understanding.

## 2023-04-28 NOTE — Telephone Encounter (Signed)
 Pt checking on results for MRI done on 04/10/23. Would like a call from the nurse about results.

## 2023-04-29 NOTE — Telephone Encounter (Signed)
 Rx refilled per appointment note.  Last appointment: 04/01/2023 Next appointment: 07/07/2023

## 2023-04-30 ENCOUNTER — Other Ambulatory Visit: Payer: Self-pay | Admitting: Cardiology

## 2023-04-30 ENCOUNTER — Other Ambulatory Visit: Payer: Self-pay | Admitting: Adult Health

## 2023-04-30 ENCOUNTER — Other Ambulatory Visit: Payer: Self-pay | Admitting: Neurology

## 2023-04-30 ENCOUNTER — Telehealth: Payer: Self-pay

## 2023-04-30 NOTE — Telephone Encounter (Signed)
 Copied from CRM [id number]. Topic: Clinical - Lab/Test Results >> Apr 30, 2023 10:52 AM Steven Vincent wrote: Reason for CRM: Patient is wanting Vincent copy of his MRI results, patient would like to pick this up in the office & would like to know if he needs to pay for this.

## 2023-04-30 NOTE — Telephone Encounter (Signed)
 I have printed and placed a copy of the MRI report and provided to the front desk to call.

## 2023-05-02 DIAGNOSIS — G4733 Obstructive sleep apnea (adult) (pediatric): Secondary | ICD-10-CM | POA: Diagnosis not present

## 2023-05-08 ENCOUNTER — Ambulatory Visit: Payer: 59 | Admitting: Neurology

## 2023-05-19 DIAGNOSIS — R200 Anesthesia of skin: Secondary | ICD-10-CM | POA: Diagnosis not present

## 2023-05-20 ENCOUNTER — Encounter (HOSPITAL_BASED_OUTPATIENT_CLINIC_OR_DEPARTMENT_OTHER): Payer: Self-pay | Admitting: Pulmonary Disease

## 2023-05-22 NOTE — Telephone Encounter (Signed)
 Pt. Has been scheduled with you for 06/06/23 @9AM 

## 2023-06-03 ENCOUNTER — Other Ambulatory Visit: Payer: Self-pay | Admitting: Cardiology

## 2023-06-03 DIAGNOSIS — K219 Gastro-esophageal reflux disease without esophagitis: Secondary | ICD-10-CM

## 2023-06-06 ENCOUNTER — Ambulatory Visit (HOSPITAL_BASED_OUTPATIENT_CLINIC_OR_DEPARTMENT_OTHER): Admitting: Pulmonary Disease

## 2023-06-06 ENCOUNTER — Encounter (HOSPITAL_BASED_OUTPATIENT_CLINIC_OR_DEPARTMENT_OTHER): Payer: Self-pay | Admitting: Pulmonary Disease

## 2023-06-06 VITALS — BP 122/74 | HR 78 | Ht 74.0 in | Wt 293.8 lb

## 2023-06-06 DIAGNOSIS — G4733 Obstructive sleep apnea (adult) (pediatric): Secondary | ICD-10-CM | POA: Diagnosis not present

## 2023-06-06 DIAGNOSIS — Z9682 Presence of neurostimulator: Secondary | ICD-10-CM

## 2023-06-06 NOTE — Patient Instructions (Signed)
 We increased the voltage to   level 3 = 0.7 V

## 2023-06-06 NOTE — Progress Notes (Signed)
 Subjective:    Patient ID: Steven Vincent, male    DOB: 02-02-1966, 58 y.o.   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  (Patient has some intellectual disability reads and writes at a third grade level.    His device was activated in 2023, he was seen 05/2022 and noted to be set with a range of 0.7 to 1.1 V with amplitude of 0.9 06/2022 amplitude was 0.8 V   Start delay 30 minutes, pause time 20 minutes duration 8 hours   12/2022 at level 2 = 0.8V >> overstimulated at this level with tongue deviation to the left  Optimal level was 0.6= level 2.  Changed to 0.5 V lower limit, upper limit 0.9 V   Start delay  to 30 minutes, pause time 25 minutes, duration 8hours   23-month follow-up visit Incoming amplitude is 0.6 V.  He is tolerating his level very well.  He is resting well, his roommate has noted mild snoring reports some sleepiness in the afternoon. .  Reviewed sleep study performed on 0.6 V. He still has residual events     Significant tests/ events reviewed    HST 04/2023 residual AHI of 11/hour on inspire  0.6 V    PSG 08/15/21 >> optimal level 0.9 V with AHI/hour, incoming amplitude was 0.7, range study was 0.5 to 0.9 V PSG 02/15/17 >> AHI 6.9, SpO2 low 85%.  Supine AHI 10.5, REM AHI 24.3. HST 02/26/18 >> AHI 11.6, SpO2 low 79% HST 07/07/18 >> AHI 7.7, SpO2 low 83% Auto CPAP 11/20/19 to 12/19/19 >> used on 12 of 30 nights with average 2 hrs 9 min.  Average AHI 4.3 with median CPAP 11 and 95 th percentile CPAP 14 cm H2O CPAP titration 01/31/20 >> CPAP 13 >> AHI 0, no REM.  Review of Systems neg for any significant sore throat, dysphagia, itching, sneezing, nasal congestion or excess/ purulent secretions, fever, chills, sweats, unintended wt loss, pleuritic or exertional cp, hempoptysis, orthopnea pnd or  change in chronic leg swelling. Also denies presyncope, palpitations, heartburn, abdominal pain, nausea, vomiting, diarrhea or change in bowel or urinary habits, dysuria,hematuria, rash, arthralgias, visual complaints, headache, numbness weakness or ataxia.     Objective:   Physical Exam  Gen. Pleasant, obese, in no distress ENT - no lesions, no post nasal drip Neck: No JVD, no thyromegaly, no carotid bruits Lungs: no use of accessory muscles, no dullness to percussion, decreased without rales or rhonchi  Cardiovascular: Rhythm regular, heart sounds  normal, no murmurs or gallops, no peripheral edema Musculoskeletal: No deformities, no cyanosis or clubbing , no tremors   Tongue stimulation observed on 0.6 V and 0.7 V with programmer, appears stronger 0.7 V with no tongue deviation    Assessment & Plan:    OSA -status post hypoglossal nerve stimulator implantation  Hypoglossal nerve stimulator was reassessed today.  Goal of the follow-up visit was to ensure good compliance, good subjective benefit, good tongue motion and good sense lead waveforms .incision sites appear good, tongue protrusion was examined.  Download was reviewed and usage appears to be up to 7 hours average per night.    Reprogram his device to 0.7 V, His range remains 0.5 to 0.9 V  He has been compliant with this device and overall has had good benefits  He has a new phone and we helped him upload the app to his phone so that we can continue to obtain download future. Follow-up up in 4 months

## 2023-06-11 DIAGNOSIS — G5601 Carpal tunnel syndrome, right upper limb: Secondary | ICD-10-CM | POA: Diagnosis not present

## 2023-06-18 DIAGNOSIS — M25531 Pain in right wrist: Secondary | ICD-10-CM | POA: Diagnosis not present

## 2023-06-24 DIAGNOSIS — M5416 Radiculopathy, lumbar region: Secondary | ICD-10-CM | POA: Diagnosis not present

## 2023-06-26 ENCOUNTER — Other Ambulatory Visit: Payer: Self-pay | Admitting: Cardiovascular Disease

## 2023-07-02 ENCOUNTER — Telehealth: Payer: Self-pay

## 2023-07-02 NOTE — Telephone Encounter (Signed)
   Pre-operative Risk Assessment    Patient Name: Steven Vincent  DOB: [birth year] MRN: [id number]   Date of last office visit: 01/30/23 Eilleen Grates, MD Date of next office visit: NONE   Request for Surgical Clearance    Procedure:   RIGHT CARPAL TUNNEL RELEASE  Date of Surgery:  Clearance 07/29/23                                Surgeon:  Alphonzo Ask, MD Surgeon's Group or Practice Name:  [facility] Phone number:  [phone] Fax number:  [phone]  ATTN: DONNA MCCAIN   Type of Clearance Requested:   - Medical    Type of Anesthesia:  MAC   Additional requests/questions:    SignedCollin Deal   07/02/2023, 5:32 PM

## 2023-07-03 ENCOUNTER — Telehealth: Payer: Self-pay | Admitting: Neurology

## 2023-07-03 NOTE — Telephone Encounter (Signed)
 Called pt left message to call our office and ask for preop team to schedule TELE preop appt.

## 2023-07-03 NOTE — Telephone Encounter (Signed)
   Name: Steven Vincent  DOB: 1965/09/27  MRN: [id number]  Primary Cardiologist: Eilleen Grates, MD   Preoperative team, please contact this patient and set up a phone call appointment for further preoperative risk assessment. Please obtain consent and complete medication review. Thank you for your help.  I confirm that guidance regarding antiplatelet and oral anticoagulation therapy has been completed and, if necessary, noted below.  None requested.  I also confirmed the patient resides in the state of [place] . As per [facility] Medical Board telemedicine laws, the patient must reside in the state in which the provider is licensed.   Morey Ar, NP 07/03/2023, 3:50 PM [facility]

## 2023-07-03 NOTE — Telephone Encounter (Signed)
 Appointment details confirmed

## 2023-07-04 ENCOUNTER — Telehealth: Payer: Self-pay

## 2023-07-04 NOTE — Telephone Encounter (Signed)
 Pt returning call

## 2023-07-04 NOTE — Telephone Encounter (Signed)
2nd attempt to call patient

## 2023-07-04 NOTE — Telephone Encounter (Signed)
  Patient Consent for Virtual Visit        Steven Vincent has provided verbal consent on 07/04/2023 for a virtual visit (video or telephone).   CONSENT FOR VIRTUAL VISIT FOR:  Steven Vincent  By participating in this virtual visit I agree to the following:  I hereby voluntarily request, consent and authorize [facility] and its employed or contracted physicians, physician assistants, nurse practitioners or other licensed health care professionals (the Practitioner), to provide me with telemedicine health care services (the "Services") as deemed necessary by the treating Practitioner. I acknowledge and consent to receive the Services by the Practitioner via telemedicine. I understand that the telemedicine visit will involve communicating with the Practitioner through live audiovisual communication technology and the disclosure of certain medical information by electronic transmission. I acknowledge that I have been given the opportunity to request an in-person assessment or other available alternative prior to the telemedicine visit and am voluntarily participating in the telemedicine visit.  I understand that I have the right to withhold or withdraw my consent to the use of telemedicine in the course of my care at any time, without affecting my right to future care or treatment, and that the Practitioner or I may terminate the telemedicine visit at any time. I understand that I have the right to inspect all information obtained and/or recorded in the course of the telemedicine visit and may receive copies of available information for a reasonable fee.  I understand that some of the potential risks of receiving the Services via telemedicine include:  Delay or interruption in medical evaluation due to technological equipment failure or disruption; Information transmitted may not be sufficient (e.g. poor resolution of images) to allow for appropriate medical decision making by the  Practitioner; and/or  In rare instances, security protocols could fail, causing a breach of personal health information.  Furthermore, I acknowledge that it is my responsibility to provide information about my medical history, conditions and care that is complete and accurate to the best of my ability. I acknowledge that Practitioner's advice, recommendations, and/or decision may be based on factors not within their control, such as incomplete or inaccurate data provided by me or distortions of diagnostic images or specimens that may result from electronic transmissions. I understand that the practice of medicine is not an exact science and that Practitioner makes no warranties or guarantees regarding treatment outcomes. I acknowledge that a copy of this consent can be made available to me via my patient portal [facility] MyChart), or I can request a printed copy by calling the office of [facility].    I understand that my insurance will be billed for this visit.   I have read or had this consent read to me. I understand the contents of this consent, which adequately explains the benefits and risks of the Services being provided via telemedicine.  I have been provided ample opportunity to ask questions regarding this consent and the Services and have had my questions answered to my satisfaction. I give my informed consent for the services to be provided through the use of telemedicine in my medical care

## 2023-07-04 NOTE — Telephone Encounter (Signed)
 Pt. Is scheduled 07/15/23 at 9:00am with Palmer Bobo, NP for pre-op clearance

## 2023-07-07 ENCOUNTER — Encounter: Payer: Self-pay | Admitting: Neurology

## 2023-07-07 ENCOUNTER — Ambulatory Visit (INDEPENDENT_AMBULATORY_CARE_PROVIDER_SITE_OTHER): Admitting: Neurology

## 2023-07-07 VITALS — BP 144/89 | HR 64 | Ht 74.0 in | Wt 295.0 lb

## 2023-07-07 DIAGNOSIS — F79 Unspecified intellectual disabilities: Secondary | ICD-10-CM

## 2023-07-07 DIAGNOSIS — G43009 Migraine without aura, not intractable, without status migrainosus: Secondary | ICD-10-CM

## 2023-07-07 DIAGNOSIS — F21 Schizotypal disorder: Secondary | ICD-10-CM

## 2023-07-07 MED ORDER — TOPIRAMATE 25 MG PO TABS: 50.0000 mg | ORAL_TABLET | Freq: Every day | ORAL | 3 refills | Status: DC

## 2023-07-07 MED ORDER — SUMATRIPTAN SUCCINATE 25 MG PO TABS: 25.0000 mg | ORAL_TABLET | ORAL | 1 refills | Status: DC | PRN

## 2023-07-07 MED ORDER — AIMOVIG 140 MG/ML ~~LOC~~ SOAJ
140.0000 mg | SUBCUTANEOUS | 3 refills | Status: DC
Start: 2023-07-07 — End: 2023-09-26
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
## 2023-07-07 NOTE — Addendum Note (Signed)
 Addended by: Neomia Banner on: 07/07/2023 04:02 PM   Modules accepted: Orders

## 2023-07-08 ENCOUNTER — Other Ambulatory Visit: Payer: Self-pay | Admitting: Neurology

## 2023-07-09 ENCOUNTER — Telehealth: Payer: Self-pay | Admitting: Neurology

## 2023-07-09 ENCOUNTER — Other Ambulatory Visit: Admitting: *Deleted

## 2023-07-09 ENCOUNTER — Ambulatory Visit (INDEPENDENT_AMBULATORY_CARE_PROVIDER_SITE_OTHER): Admitting: Neurology

## 2023-07-09 DIAGNOSIS — R4182 Altered mental status, unspecified: Secondary | ICD-10-CM | POA: Diagnosis not present

## 2023-07-09 DIAGNOSIS — F79 Unspecified intellectual disabilities: Secondary | ICD-10-CM

## 2023-07-09 DIAGNOSIS — F21 Schizotypal disorder: Secondary | ICD-10-CM

## 2023-07-09 DIAGNOSIS — G43009 Migraine without aura, not intractable, without status migrainosus: Secondary | ICD-10-CM

## 2023-07-09 NOTE — Telephone Encounter (Signed)
 Noted, will pass the information along to MD to review. We will wait and see what EEG shows.

## 2023-07-09 NOTE — Telephone Encounter (Signed)
 Pt came into office for EEG. Wanted to let Dr. 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 Where he has severe violent trembles, full blindness, and is very cold and extremely scared. States when he's taken off medicine he has full seizures and full blindness at night while driving.

## 2023-07-09 NOTE — Procedures (Signed)
   History:  58 year old with events concerning for seizures.   EEG classification:  Awake and asleep  Duration: 26 minutes   Technical aspects: This EEG study was done with scalp electrodes positioned according to the 10-20 International system of electrode placement. Electrical activity was reviewed with band pass filter of 1-70Hz , sensitivity of 7 uV/mm, display speed of 44mm/sec with a 60Hz  notched filter applied as appropriate. EEG data were recorded continuously and digitally stored.   Description of the recording: The background rhythms of this recording consists of a fairly well modulated medium amplitude background activity of 9-10 Hz. As the record progresses, the patient initially is in the waking state, but appears to enter the early stage II sleep during the recording, with rudimentary sleep spindles and vertex sharp wave activity seen. During the wakeful state, photic stimulation was performed, and no abnormal responses were seen. Hyperventilation was also performed, no abnormal response seen. No epileptiform discharges seen during this recording. There was no focal slowing.   Abnormality: None   Impression: This is a normal awake and sleep EEG. No evidence of interictal epileptiform discharges. Normal EEGs, however, do not rule out epilepsy.    Jorgen Wolfinger, MD [facility]

## 2023-07-09 NOTE — Telephone Encounter (Signed)
 Error

## 2023-07-10 ENCOUNTER — Telehealth: Payer: Self-pay | Admitting: *Deleted

## 2023-07-10 ENCOUNTER — Ambulatory Visit: Payer: Self-pay | Admitting: Neurology

## 2023-07-10 NOTE — Telephone Encounter (Signed)
 Copied from CRM [id number]. Topic: Clinical - Medication Question >> Jul 10, 2023  2:45 PM Trula Gable C wrote: Reason for CRM: Patient called in regarding rOPINIRole  (REQUIP ) 0.25 MG tablet , stated he was trying to reorder it and was unable to I did inform him it was discontinued he would like a nurse to call him back about this

## 2023-07-11 NOTE — Telephone Encounter (Signed)
 ropinirole  was D/C'd 06/24/2022. No new script currently on file.

## 2023-07-14 ENCOUNTER — Telehealth: Payer: Self-pay | Admitting: Cardiology

## 2023-07-14 ENCOUNTER — Ambulatory Visit (INDEPENDENT_AMBULATORY_CARE_PROVIDER_SITE_OTHER): Admitting: Family Medicine

## 2023-07-14 ENCOUNTER — Telehealth: Payer: Self-pay

## 2023-07-14 ENCOUNTER — Encounter: Payer: Self-pay | Admitting: Family Medicine

## 2023-07-14 ENCOUNTER — Telehealth: Payer: Self-pay | Admitting: Neurology

## 2023-07-14 VITALS — BP 130/80 | HR 64 | Resp 16 | Ht 74.0 in | Wt 294.0 lb

## 2023-07-14 DIAGNOSIS — G2581 Restless legs syndrome: Secondary | ICD-10-CM | POA: Diagnosis not present

## 2023-07-14 DIAGNOSIS — F79 Unspecified intellectual disabilities: Secondary | ICD-10-CM

## 2023-07-14 DIAGNOSIS — M47817 Spondylosis without myelopathy or radiculopathy, lumbosacral region: Secondary | ICD-10-CM | POA: Diagnosis not present

## 2023-07-14 DIAGNOSIS — R569 Unspecified convulsions: Secondary | ICD-10-CM | POA: Diagnosis not present

## 2023-07-14 DIAGNOSIS — G255 Other chorea: Secondary | ICD-10-CM | POA: Diagnosis not present

## 2023-07-14 MED ORDER — TOPIRAMATE 25 MG PO TABS: 25.0000 mg | ORAL_TABLET | Freq: Two times a day (BID) | ORAL | 3 refills | Status: DC

## 2023-07-14 MED ORDER — ROPINIROLE HCL 0.5 MG PO TABS: 0.5000 mg | ORAL_TABLET | Freq: Every day | ORAL | 1 refills | Status: DC

## 2023-07-14 MED ORDER — ROPINIROLE HCL 0.5 MG PO TABS: 0.5000 mg | ORAL_TABLET | Freq: Every day | ORAL | 0 refills | Status: DC

## 2023-07-14 NOTE — Assessment & Plan Note (Signed)
 He was dx'ed by neuro years ago. Recent episode his friend describes as a "small seizure" , although bilateral, could have been repeated to this problem. Currently on Topamax . Continue following with neurologist.

## 2023-07-14 NOTE — Telephone Encounter (Signed)
 I left message for the pt to call back as message came in he is wanting to reschedule his tele.    Surgery looks to be scheduled for 07/29/23 with Dr. Dayne Even.

## 2023-07-14 NOTE — Telephone Encounter (Signed)
  Patient Consent for Virtual Visit        [name] has provided verbal consent on 07/14/2023 for a virtual visit (video or telephone). Appt scheduled for 07/18/23 at 9am. Med rec and consent. Call patient [phone].    CONSENT FOR VIRTUAL VISIT FOR:  Steven Vincent  By participating in this virtual visit I agree to the following:  I hereby voluntarily request, consent and authorize [facility] and its employed or contracted physicians, physician assistants, nurse practitioners or other licensed health care professionals (the Practitioner), to provide me with telemedicine health care services (the "Services") as deemed necessary by the treating Practitioner. I acknowledge and consent to receive the Services by the Practitioner via telemedicine. I understand that the telemedicine visit will involve communicating with the Practitioner through live audiovisual communication technology and the disclosure of certain medical information by electronic transmission. I acknowledge that I have been given the opportunity to request an in-person assessment or other available alternative prior to the telemedicine visit and am voluntarily participating in the telemedicine visit.  I understand that I have the right to withhold or withdraw my consent to the use of telemedicine in the course of my care at any time, without affecting my right to future care or treatment, and that the Practitioner or I may terminate the telemedicine visit at any time. I understand that I have the right to inspect all information obtained and/or recorded in the course of the telemedicine visit and may receive copies of available information for a reasonable fee.  I understand that some of the potential risks of receiving the Services via telemedicine include:  Delay or interruption in medical evaluation due to technological equipment failure or disruption; Information transmitted may not be sufficient (e.g.  poor resolution of images) to allow for appropriate medical decision making by the Practitioner; and/or  In rare instances, security protocols could fail, causing a breach of personal health information.  Furthermore, I acknowledge that it is my responsibility to provide information about my medical history, conditions and care that is complete and accurate to the best of my ability. I acknowledge that Practitioner's advice, recommendations, and/or decision may be based on factors not within their control, such as incomplete or inaccurate data provided by me or distortions of diagnostic images or specimens that may result from electronic transmissions. I understand that the practice of medicine is not an exact science and that Practitioner makes no warranties or guarantees regarding treatment outcomes. I acknowledge that a copy of this consent can be made available to me via my patient portal [facility] MyChart), or I can request a printed copy by calling the office of [facility].    I understand that my insurance will be billed for this visit.   I have read or had this consent read to me. I understand the contents of this consent, which adequately explains the benefits and risks of the Services being provided via telemedicine.  I have been provided ample opportunity to ask questions regarding this consent and the Services and have had my questions answered to my satisfaction. I give my informed consent for the services to be provided through the use of telemedicine in my medical care

## 2023-07-14 NOTE — Telephone Encounter (Signed)
 Spoke w/Pt regarding issues since decreasing dose of topiramate . Pt stated on Sat he had tremors/seizure where his body shook for about a second. Pt stated he had been chopping wood earlier but that is not our of the ordinary for him. Pt stated he was concerned about decreasing the dose and that none of this occurred while he was on the topiramate  50 mg twice daily. Pt also asked about his EEG results. Informed Pt the results were sent to [facility] - Pt stated he doesn't use [facility] now. Informed Pt his EEG results were normal. Pt stated understanding. Informed Pt will get a note to Dr. Albertina Hugger regarding topiramate  and will reach out to him with any changes or recommendations from provider. Pt stated understanding. Pt stated he needs a letter for housing that states what level of disability he has. Pt stated when he applies for housing they ask what level of disability even though he already receives disability. Can we provide a letter? Will send request to provider. Pt stated understanding.

## 2023-07-14 NOTE — Patient Instructions (Addendum)
 A few things to remember from today's visit:  RLS (restless legs syndrome) - Plan: rOPINIRole  (REQUIP ) 0.5 MG tablet, DISCONTINUED: rOPINIRole  (REQUIP ) 0.5 MG tablet  Seizure-like activity (HCC)  Hemiballismus  DJD (degenerative joint disease), lumbosacral  Please contact your neurologist with concerns about seizures. In regard to letter of "mental disability" recommend requesting it from psychiatrist and/or neurologist.  Resume Requip  0.5 mg at bedtime.  If you need refills for medications you take chronically, please call your pharmacy. Do not use My Chart to request refills or for acute issues that need immediate attention. If you send a my chart message, it may take a few days to be addressed, specially if I am not in the office.  Please be sure medication list is accurate. If a new problem present, please set up appointment sooner than planned today.

## 2023-07-14 NOTE — Assessment & Plan Note (Signed)
 Following with neurologist. Reports "small seizure" 2 days ago, no LOC or post ictal like symptoms, which he thinks was caused by decreasing Topiramate  dose. Recommend not to drive. Instructed to call his neurologist's office. Instructed about warning signs.

## 2023-07-14 NOTE — Assessment & Plan Note (Signed)
 He is requesting a letter of prove for this disorder. He reports that this problem was dxed by his former psychiatrist, who he has not seen in about 2 years, missed several appointments so he was discharged from the practice. He has also been evaluated by neurologist. Recommend establishing with new psychiatrist (contact information for [facility] given); he may need to be re-evaluated is tests done in the past are not available.

## 2023-07-14 NOTE — Progress Notes (Signed)
 HPI: Steven Vincent is a 58 y.o. male with a PMHx significant for RLS, HLD, seizures, migraine headaches, hearing loss, and OSA, who is here today with his friend for chronic disease management.  Last seen on 03/10/23  Restless Leg syndrome: Pt reports trying attempting to get a refill on his Requip , which I have prescribed in the past. States that neuro did not fill rx because it seemed to be discontinued.  His Requip  was last filled on 03/26/23.  Today, he reports is requesting a refill for Requip .  Pt has been taking Requip  0.5 mg once nightly with good clinical effect but symptoms reoccured after running out of medication a few days ago. When he has missed doses, he reports his "legs lock". He denies side effects.  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 More recently, he had an EEG on 07/09/23-- results were normal.  He repots his neurologist recently decreased Topamax  dose.  His friend reports that she witnessed a "small seizure" 2 days ago. States that his entire body was jerking for about 15-30 seconds.  Pt states that he was aware, did not lose consciousness.  No associated bowel or urinary incontinence and no post ictal like symptoms.   Brain MRI 04/10/23: 1. No acute or reversible finding. No evidence of injury to the deep brain structures. 2. 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 3. Mild cerebral hemispheric volume loss with subjective parietal Predominance  He did not seem medical attention and has not contacted his neurologist.   His friend also reports episodes of losing his balance Several years ago he had temporary episodes of blindness while driving, which he attributes to his seizures.  He is still driving, not today.  Intellectual disability: He has been evaluated by psychiatrist in the past, but reports he has not seen provider for a couple years.   He is requesting a document that declares he has an intellectual disability.  He denies hx of schizophrenic personality, instead he was Dx'ed with hemiballism.  Hypertension: He has not been monitoring his BP at home.  Endorses occasional dizziness.  Denies any chest pain, dyspnea, shortness of breath, and palpitations.   BP Readings from Last 3 Encounters:  07/14/23 130/80  07/07/23 (!) 144/89  06/06/23 122/74   Today he mentions decreased sensation of feet, numbness, which was recently evaluated by home health visit but he reports having for a while. Also reports lower back pain radiated to LE's, L>R. He states that he has received epidural injection and follows with ortho. He previously had test, according to description, it seems like he had an EMG.   Review of Systems  Constitutional:  Negative for activity change, appetite change and fever.  HENT:  Negative for sore throat.   Respiratory:  Negative for cough and wheezing.   Cardiovascular:  Negative for leg swelling.  Gastrointestinal:  Negative for abdominal pain, nausea and vomiting.  Endocrine: Negative for cold intolerance and heat intolerance.  Genitourinary:  Negative for decreased urine volume, dysuria and hematuria.  Skin:  Negative for rash.  Neurological:  Negative for syncope and headaches.  Psychiatric/Behavioral:  Negative for confusion and hallucinations.   See other pertinent positives and negatives in HPI.  Current Outpatient Medications on File Prior to Visit  Medication Sig Dispense Refill   celecoxib (CELEBREX) 200 MG capsule      diclofenac  Sodium (VOLTAREN ) 1 % GEL Apply 1  application. topically 4 (four) times daily as needed (pain).     dronedarone  (MULTAQ ) 400 MG tablet TAKE 1 TABLET BY MOUTH TWICE  DAILY WITH A MEAL 180 tablet 2   Erenumab -aooe (AIMOVIG ) 140 MG/ML SOAJ Inject 140 mg into the skin every 30 (thirty) days. 3 mL 3   ketoconazole  (NIZORAL ) 2 % cream Apply 1 Application topically daily.  30 g 0   Melatonin 5 MG CAPS Take 5 mg by mouth at bedtime.     meloxicam  (MOBIC ) 15 MG tablet Take 1 tablet (15 mg total) by mouth daily. 90 tablet 1   nitroGLYCERIN  (NITROSTAT ) 0.4 MG SL tablet DISSOLVE 1 TABLET UNDER THE  TONGUE EVERY 5 MINUTES AS NEEDED FOR CHEST PAIN. MAX OF 3 TABLETS IN 15 MINUTES. CALL 911 IF PAIN  PERSISTS. 125 tablet 2   pantoprazole  (PROTONIX ) 20 MG tablet TAKE 1 TABLET BY MOUTH DAILY  BEFORE BREAKFAST 100 tablet 1   pravastatin  (PRAVACHOL ) 20 MG tablet TAKE 1 TABLET BY MOUTH DAILY 100 tablet 2   SUMAtriptan  (IMITREX ) 25 MG tablet Take 1 tablet (25 mg total) by mouth every 2 (two) hours as needed for migraine. May repeat in 2 hours if headache persists or recurs. 30 tablet 1   triamcinolone  cream (KENALOG ) 0.1 % Apply 1 Application topically 2 (two) times daily as needed (rash).     topiramate  (TOPAMAX ) 25 MG tablet Take 1 tablet (25 mg total) by mouth 2 (two) times daily. 180 tablet 3   No current facility-administered medications on file prior to visit.    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   Dysrhythmia    bradycardia   HLD (hyperlipidemia)    Low back pain    Migraines    OSA (obstructive sleep apnea) 04/10/2017   Personality disorder (HCC)    Seizures (HCC)    Allergies  Allergen Reactions   Delsym [Dextromethorphan] Other (See Comments)    Gets loopy/memory loss   Aspirin  Nausea And Vomiting    GI upset    Mucinex  D [Pseudoephedrine-Guaifenesin  Er] Hypertension   Ibuprofen  Other (See Comments)    Stomach irritation    Social History   Socioeconomic History   Marital status: Divorced    Spouse name: Not on file   Number of children: 0   Years of education: Not on file   Highest education level: Not on file  Occupational History   Occupation: disabled  Tobacco Use   Smoking status: Former    Current packs/day: 0.00    Average packs/day: 1 pack/day for 1 year (1.0  ttl pk-yrs)    Types: Cigarettes    Start date: 55    Quit date: 76    Years since quitting: 33.4    Passive exposure: Past   Smokeless tobacco: Never   Tobacco comments:    Pt stopped smoking in the early 90's  Vaping Use   Vaping status: Never Used  Substance and Sexual Activity   Alcohol use: No   Drug use: No   Sexual activity: Not Currently  Other Topics Concern   Not on file  Social History Narrative   03/16/18: Lives alone with dog, Gussie Legato, apartment. Wife of 16 years left him about 2 years ago, and pt. has had difficulty adjusting.   Says his brother Audrene Blessing, who lives in [place], is his main contact, source of support, as well  as his dog.    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 States he uses painting as additional therapy.     Pt works    Social Drivers of Corporate investment banker Strain: Medium Risk (04/06/2021)   Overall Financial Resource Strain (CARDIA)    Difficulty of Paying Living Expenses: Somewhat hard  Food Insecurity: Food Insecurity Present (04/06/2021)   Hunger Vital Sign    Worried About Running Out of Food in the Last Year: Often true    Ran Out of Food in the Last Year: Sometimes true  Transportation Needs: No Transportation Needs (11/30/2021)   PRAPARE - Administrator, Civil Service (Medical): No    Lack of Transportation (Non-Medical): No  Physical Activity: Sufficiently Active (04/06/2021)   Exercise Vital Sign    Days of Exercise per Week: 3 days    Minutes of Exercise per Session: 120 min  Stress: No Stress Concern Present (04/06/2021)   [employer] of Occupational Health - Occupational Stress Questionnaire    Feeling of Stress : Not at all  Social Connections: Moderately Integrated (04/06/2021)   Social Connection and Isolation Panel [NHANES]    Frequency of Communication with Friends and Family: More than three times a week     Frequency of Social Gatherings with Friends and Family: More than three times a week    Attends Religious Services: More than 4 times per year    Active Member of [employer] or Organizations: Yes    Attends Banker Meetings: More than 4 times per year    Marital Status: Divorced   Vitals:   07/14/23 0743  BP: 130/80  Pulse: 64  Resp: 16  SpO2: 98%   Body mass index is 37.75 kg/m.  Physical Exam Vitals and nursing note reviewed.  Constitutional:      General: He is not in acute distress.    Appearance: He is well-developed.  HENT:     Head: Normocephalic and atraumatic.     Mouth/Throat:     Mouth: Mucous membranes are moist.     Pharynx: Oropharynx is clear.  Eyes:     Conjunctiva/sclera: Conjunctivae normal.  Cardiovascular:     Rate and Rhythm: Normal rate and regular rhythm.     Heart sounds: No murmur heard.    Comments: Trace pitting LE edema, bilateral. Pulmonary:     Effort: Pulmonary effort is normal. No respiratory distress.     Breath sounds: Normal breath sounds.  Abdominal:     Palpations: Abdomen is soft. There is no mass.     Tenderness: There is no abdominal tenderness.  Skin:    General: Skin is warm.     Findings: No erythema or rash.  Neurological:     General: No focal deficit present.     Mental Status: He is alert and oriented to person, place, and time.     Gait: Gait normal.     Comments: In general he is oriented x 3 but is off on the numerical date by one day, "07/15/2023"  Psychiatric:        Mood and Affect: Mood and affect normal.   ASSESSMENT AND PLAN: Mr. Jessey Stehlin was seen today for chronic disease management.   DJD (degenerative joint disease), lumbosacral Assessment & Plan: He reports having epidural injections in the past to treat LE's radicular symptoms. Continue appropriate feet/skin care. He was following with  Dr Theressa Flatness back pain with radiculopathy.   RLS (restless legs syndrome) Assessment &  Plan: Requip  0.5 mg daily at bedtime has helped, symptoms reoccurred when he ran out of medication. He has tolerated medication well, reports no side effects. Resume Ropinirole  0.5 mg at bedtime.  Orders: -     rOPINIRole  HCl; Take 1 tablet (0.5 mg total) by mouth at bedtime.  Dispense: 90 tablet; Refill: 1  Seizure-like activity (HCC)  Hemiballismus Assessment & Plan: He was dx'ed by neuro years ago. Recent episode his friend describes as a "small seizure" , although bilateral, could have been repeated to this problem. Currently on Topamax . Continue following with neurologist.   Seizures (HCC) Assessment & Plan: Following with neurologist. Reports "small seizure" 2 days ago, no LOC or post ictal like symptoms, which he thinks was caused by decreasing Topiramate  dose. Recommend not to drive. Instructed to call his neurologist's office. Instructed about warning signs.   Intellectual disability Assessment & Plan: He is requesting a letter of prove for this disorder. He reports that this problem was dxed by his former psychiatrist, who he has not seen in about 2 years, missed several appointments so he was discharged from the practice. He has also been evaluated by neurologist. Recommend establishing with new psychiatrist (contact information for [facility] given); he may need to be re-evaluated is tests done in the past are not available.   I spent a total of 46 minutes in both face to face and non face to face activities for this visit on the date of this encounter. During this time history was obtained and documented, examination was performed, prior labs/imaging reviewed, and assessment/plan discussed.  Return if symptoms worsen or fail to improve, for keep next appointment.  I, Bernita Bristle, acting as a scribe for Rebeckah Masih [place], MD., have documented all relevant documentation on the behalf of Iann Rodier [place], MD, as directed by   while in the presence of  Latese Dufault [place], MD.  I, Rubee Vega [place], MD, have reviewed all documentation for this visit. The documentation on 07/14/23 for the exam, diagnosis, procedures, and orders are all accurate and complete.  Brittnei Jagiello G. [place], MD  [facility]. [place] office.

## 2023-07-14 NOTE — Telephone Encounter (Signed)
 Call to patient, he reports since lowering topamax , has had tremors and violent shivers. Advised I would send to Dr. Albertina Hugger nurse for review.

## 2023-07-14 NOTE — Telephone Encounter (Signed)
 Patient called back to schedule telehealth pre-op appointment. Appointment scheduled for 07/18/23 @ 9am.  Med rec and consent. Call patient [phone].

## 2023-07-14 NOTE — Assessment & Plan Note (Addendum)
 He reports having epidural injections in the past to treat LE's radicular symptoms. Continue appropriate feet/skin care. He was following with Dr Theressa Flatness back pain with radiculopathy.

## 2023-07-14 NOTE — Addendum Note (Signed)
 Addended by: Neomia Banner on: 07/14/2023 05:35 PM   Modules accepted: Orders

## 2023-07-14 NOTE — Telephone Encounter (Signed)
 Pt called because over the weekend he experience a tremor and  may think because medication has been lowered it triggered his seizers .  Pt request to go back to higher dosage .  topiramate  (TOPAMAX ) 25 MG tablet  topiramate  (TOPAMAX ) 50 MG tablet  Also Pt is request a form stating his disability  Pt need to let social services know  what his disability is and how it effect him in day to day activity and what level it is.   Pt also stated  that he never got result from last test  and would like to know  what those results are .  Pt states at last Visit provider brought up a memory  management medication.  Pt could not remember name of it . Pt stated would love to know about medication and side effects .

## 2023-07-14 NOTE — Assessment & Plan Note (Signed)
 Requip  0.5 mg daily at bedtime has helped, symptoms reoccurred when he ran out of medication. He has tolerated medication well, reports no side effects. Resume Ropinirole  0.5 mg at bedtime.

## 2023-07-14 NOTE — Telephone Encounter (Signed)
 Patient wants to reschedule his tele-visit.

## 2023-07-15 ENCOUNTER — Ambulatory Visit: Attending: Cardiovascular Disease | Admitting: Emergency Medicine

## 2023-07-15 ENCOUNTER — Other Ambulatory Visit: Payer: Self-pay | Admitting: Adult Health

## 2023-07-15 DIAGNOSIS — M5416 Radiculopathy, lumbar region: Secondary | ICD-10-CM | POA: Diagnosis not present

## 2023-07-15 DIAGNOSIS — Z01810 Encounter for preprocedural cardiovascular examination: Secondary | ICD-10-CM

## 2023-07-15 NOTE — Telephone Encounter (Signed)
 Pt has called back , he was informed of my chart message. Phone rep read the message to pt because he said he does not have my chart.  Pt states re: how Dr Dohmeier wants him to take the topiramate  (TOPAMAX ) 25 MG tablet  he says he has been taking it as 1 in the a.m. and 1 at night.  Pt would also like a call back re: if Dr Albertina Hugger will call something in for his memory, please call.

## 2023-07-15 NOTE — Telephone Encounter (Signed)
 Spoke w/Pt regarding dosage change. Pt stated he is concerned about the lower dose of topiramate  of 25 mg twice daily and the possibility of him having seizures. Pt stated he drives and does not have anyone to rely on for transportation. Pt stated before this dosage change he was taking topiramate  50 mg, 2 tabs in the AM and 1 tab in PM. Pt was reading this from his bottle of medication he gets from his mail order pharmacy. Per Pt the directions on the bottle of topiramate  50 mg  reads take 1 tab in AM and 2 tabs in PM. Pt stated he took them opposite because he needed to be driving during the day and did not want to risk having a seizure. Pt stated he is concerned about dropping his medication from 100 mg in AM to 25 mg and 50 mg in PM to 25 mg. Informed Pt a message will be relayed to the provider for review. Pt stated understanding.

## 2023-07-15 NOTE — Progress Notes (Signed)
 Virtual Visit via Telephone Note   Because of [name] co-morbid illnesses, he is at least at moderate risk for complications without adequate follow up.  This format is felt to be most appropriate for this patient at this time.  Due to technical limitations with video connection (technology), today's appointment will be conducted as an audio only telehealth visit, and Cloys Vera verbally agreed to proceed in this manner.   All issues noted in this document were discussed and addressed.  No physical exam could be performed with this format.  Evaluation Performed:  Preoperative cardiovascular risk assessment _____________   Date:  07/15/2023   Patient ID:  Steven Vincent, DOB [birth year], MRN [id number] Patient Location:  Home Provider location:   Office  Primary Care Provider:  [place], Betty G, MD Primary Cardiologist:  Eilleen Grates, MD  Chief Complaint / Patient Profile   58 y.o. y/o male with a h/o PVCs, bradycardia, dyslipidemia, OSA who is pending right carpal tunnel release on 07/29/2023 by Dr. Ana Balling with [facility] and presents today for telephonic preoperative cardiovascular risk assessment.  History of Present Illness    Steven Vincent is a 58 y.o. male who presents via audio/video conferencing for a telehealth visit today.  Pt was last seen in cardiology clinic on 01/30/2023 by Dr. Lavonne Prairie.  At that time Ky Rumple was doing well.  The patient is now pending procedure as outlined above. Since his last visit, he denies chest pain, shortness of breath, lower extremity edema, fatigue, palpitations, melena, hematuria, hemoptysis, diaphoresis, weakness, presyncope, syncope, orthopnea, and PND.  Today patient is doing well overall.  He is without any acute cardiovascular concerns or complaints.  He denies any chest pains or exertional angina.  He continues to work daily.  He stays fairly active and notes that he  does yard work, household chores, can walk up a flight of stairs, and can even run.  He is easily able to complete greater than 4 METS.  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   Dysrhythmia    bradycardia   HLD (hyperlipidemia)    Low back pain    Migraines    OSA (obstructive sleep apnea) 04/10/2017   Personality disorder (HCC)    Seizures (HCC)    Past Surgical History:  Procedure Laterality Date   COLONOSCOPY     normal   DRUG INDUCED ENDOSCOPY N/A 03/07/2021   Procedure: DRUG INDUCED SLEEP ENDOSCOPY;  Surgeon: Virgina Grills, MD;  Location: [facility];  Service: ENT;  Laterality: N/A;   IMPLANTATION OF HYPOGLOSSAL NERVE STIMULATOR Right 05/16/2021   Procedure: IMPLANTATION OF HYPOGLOSSAL NERVE STIMULATOR;  Surgeon: Virgina Grills, MD;  Location: [facility] OR;  Service: ENT;  Laterality: Right;   PATELLA-FEMORAL ARTHROPLASTY Right 12/25/2021   Procedure: RIGHT PATELLOFEMORAL REPLACEMENT;  Surgeon: Dayne Even, MD;  Location: [facility];  Service: Orthopedics;  Laterality: Right;   TOOTH EXTRACTION  12/27/2014   lower left   TOTAL KNEE ARTHROPLASTY Left 11/28/2020   Procedure: LEFT KNEE PATELLAFEMORL REPLACEMENT;  Surgeon: Dayne Even, MD;  Location: [facility];  Service: Orthopedics;  Laterality: Left;    Allergies  Allergies  Allergen Reactions   Delsym [Dextromethorphan] Other (See Comments)    Gets loopy/memory loss   Aspirin  Nausea And Vomiting    GI upset  Mucinex  D [Pseudoephedrine-Guaifenesin  Er] Hypertension   Ibuprofen  Other (See Comments)    Stomach irritation    Home Medications    Prior to Admission medications   Medication Sig Start Date End Date Taking? Authorizing Provider  celecoxib (CELEBREX) 200 MG capsule  01/10/23   [provider]  diclofenac  Sodium (VOLTAREN ) 1 % GEL Apply 1 application. topically 4 (four) times daily as needed  (pain).    [provider]  dronedarone  (MULTAQ ) 400 MG tablet TAKE 1 TABLET BY MOUTH TWICE  DAILY WITH A MEAL 05/02/23   Eilleen Grates, MD  Erenumab -aooe (AIMOVIG ) 140 MG/ML SOAJ Inject 140 mg into the skin every 30 (thirty) days. 07/07/23   Dohmeier, Raoul Byes, MD  ketoconazole  (NIZORAL ) 2 % cream Apply 1 Application topically daily. 03/15/22   Donley Furth, MD  Melatonin 5 MG CAPS Take 5 mg by mouth at bedtime.    [provider]  meloxicam  (MOBIC ) 15 MG tablet Take 1 tablet (15 mg total) by mouth daily. 08/26/22   [place], Betty G, MD  nitroGLYCERIN  (NITROSTAT ) 0.4 MG SL tablet DISSOLVE 1 TABLET UNDER THE  TONGUE EVERY 5 MINUTES AS NEEDED FOR CHEST PAIN. MAX OF 3 TABLETS IN 15 MINUTES. CALL 911 IF PAIN  PERSISTS. 06/27/23   Maudine Sos, MD  pantoprazole  (PROTONIX ) 20 MG tablet TAKE 1 TABLET BY MOUTH DAILY  BEFORE BREAKFAST 06/03/23   Eilleen Grates, MD  pravastatin  (PRAVACHOL ) 20 MG tablet TAKE 1 TABLET BY MOUTH DAILY 10/16/22   Tania Familia, NP  rOPINIRole  (REQUIP ) 0.5 MG tablet Take 1 tablet (0.5 mg total) by mouth at bedtime. 07/14/23   [place], Betty G, MD  SUMAtriptan  (IMITREX ) 25 MG tablet Take 1 tablet (25 mg total) by mouth every 2 (two) hours as needed for migraine. May repeat in 2 hours if headache persists or recurs. 07/07/23   Dohmeier, Raoul Byes, MD  topiramate  (TOPAMAX ) 25 MG tablet Take 1 tablet (25 mg total) by mouth 2 (two) times daily. 07/14/23   Dohmeier, Raoul Byes, MD  triamcinolone  cream (KENALOG ) 0.1 % Apply 1 Application topically 2 (two) times daily as needed (rash).    [provider]    Physical Exam    Vital Signs:  Nethan Caudillo does not have vital signs available for review today.  Given telephonic nature of communication, physical exam is limited. AAOx3. NAD. Normal affect.  Speech and respirations are unlabored.  Accessory Clinical Findings    None  Assessment & Plan    1.  Preoperative Cardiovascular Risk Assessment: According  to the Revised Cardiac Risk Index (RCRI), his Perioperative Risk of Major Cardiac Event is (%): 0.4. His Functional Capacity in METs is: 8.23 according to the Duke Activity Status Index (DASI). Therefore, based on ACC/AHA guidelines, patient would be at acceptable risk for the planned procedure without further cardiovascular testing.   The patient was advised that if he develops new symptoms prior to surgery to contact our office to arrange for a follow-up visit, and he verbalized understanding.  A copy of this note will be routed to requesting surgeon.  Time:   Today, I have spent 6 minutes with the patient with telehealth technology discussing medical history, symptoms, and management plan.     Ava Boatman, NP  07/15/2023, 9:10 AM

## 2023-07-16 MED ORDER — TOPIRAMATE 50 MG PO TABS: 50.0000 mg | ORAL_TABLET | Freq: Two times a day (BID) | ORAL | 3 refills | Status: DC

## 2023-07-16 NOTE — Telephone Encounter (Signed)
 Spoke w/Pt to inform him that Dr. Albertina Hugger wants him to take topiramate  50 mg in AM and 50 mg in PM. Pt stated understanding. Pt also stated that he spoke with his pharmacy and they told him topiramate  is not usually known to cause memory issues. Informed Pt a new script for the topiramate  will be sent to his mail order pharmacy so they will have the correct script on file. Pt stated understanding.

## 2023-07-16 NOTE — Addendum Note (Signed)
 Addended by: Mayola Mcbain K on: 07/16/2023 10:04 AM   Modules accepted: Orders

## 2023-07-18 ENCOUNTER — Telehealth: Payer: Self-pay | Admitting: Nurse Practitioner

## 2023-07-18 ENCOUNTER — Ambulatory Visit: Attending: Cardiology

## 2023-07-18 DIAGNOSIS — Z01810 Encounter for preprocedural cardiovascular examination: Secondary | ICD-10-CM | POA: Diagnosis not present

## 2023-07-18 NOTE — Progress Notes (Signed)
 Virtual Visit via Telephone Note   Because of Steven Vincent co-morbid illnesses, he is at least at moderate risk for complications without adequate follow up.  This format is felt to be most appropriate for this patient at this time.  Due to technical limitations with video connection (technology), today's appointment will be conducted as an audio only telehealth visit, and Steven Vincent verbally agreed to proceed in this manner.   All issues noted in this document were discussed and addressed.  No physical exam could be performed with this format.  Evaluation Performed:  Preoperative cardiovascular risk assessment _____________   Date:  07/18/2023   Patient ID:  Steven Vincent, DOB 1965-12-02, MRN [id number] Patient Location:  Home Provider location:   Office  Primary Care Provider:  [place], Betty G, MD Primary Cardiologist:  Eilleen Grates, MD  Chief Complaint / Patient Profile   58 y.o. y/o male with a h/o frequent PVCs, HLD, OSA, bradycardia who is pending right carpal tunnel release and presents today for telephonic preoperative cardiovascular risk assessment.  History of Present Illness    Steven Vincent is a 58 y.o. male who presents via audio/video conferencing for a telehealth visit today.  Pt was last seen in cardiology clinic on 01/30/2023 by Dr. Lavonne Prairie.  At that time Lawsen Arnott was doing well but reported occasional mild dizziness but denied any sustained palpitations.  Patient's blood pressure was stable compliant with Multaq  and CPAP.  The patient is now pending procedure as outlined above. Since his last visit, he reports doing well with no new cardiac changes.  He is able to complete greater than 4 METS of activity without any difficulty.  He reports that he is not having any complaints of dizziness or palpitations since his previous cardiac follow-up.  He denies chest pain, shortness of breath, lower extremity edema, fatigue, palpitations,  melena, hematuria, hemoptysis, diaphoresis, weakness, presyncope, syncope, orthopnea, and PND.   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   Dysrhythmia    bradycardia   HLD (hyperlipidemia)    Low back pain    Migraines    OSA (obstructive sleep apnea) 04/10/2017   Personality disorder (HCC)    Seizures (HCC)    Past Surgical History:  Procedure Laterality Date   COLONOSCOPY     normal   DRUG INDUCED ENDOSCOPY N/A 03/07/2021   Procedure: DRUG INDUCED SLEEP ENDOSCOPY;  Surgeon: Virgina Grills, MD;  Location: [facility];  Service: ENT;  Laterality: N/A;   IMPLANTATION OF HYPOGLOSSAL NERVE STIMULATOR Right 05/16/2021   Procedure: IMPLANTATION OF HYPOGLOSSAL NERVE STIMULATOR;  Surgeon: Virgina Grills, MD;  Location: [facility];  Service: ENT;  Laterality: Right;   PATELLA-FEMORAL ARTHROPLASTY Right 12/25/2021   Procedure: RIGHT PATELLOFEMORAL REPLACEMENT;  Surgeon: Dayne Even, MD;  Location: [facility];  Service: Orthopedics;  Laterality: Right;   TOOTH EXTRACTION  12/27/2014   lower left   TOTAL KNEE ARTHROPLASTY Left 11/28/2020   Procedure: LEFT KNEE PATELLAFEMORL REPLACEMENT;  Surgeon: Dayne Even, MD;  Location: [facility];  Service: Orthopedics;  Laterality: Left;    Allergies  Allergies  Allergen Reactions   Delsym [Dextromethorphan] Other (See Comments)    Gets loopy/memory loss   Aspirin  Nausea And Vomiting    GI upset    Mucinex  D [Pseudoephedrine-Guaifenesin  Er] Hypertension   Ibuprofen  Other (  See Comments)    Stomach irritation    Home Medications    Prior to Admission medications   Medication Sig Start Date End Date Taking? Authorizing Provider  celecoxib (CELEBREX) 200 MG capsule  01/10/23   [provider]  diclofenac  Sodium (VOLTAREN ) 1 % GEL Apply 1 application. topically 4 (four) times daily as needed (pain).    [provider]   dronedarone  (MULTAQ ) 400 MG tablet TAKE 1 TABLET BY MOUTH TWICE  DAILY WITH A MEAL 05/02/23   Eilleen Grates, MD  Erenumab -aooe (AIMOVIG ) 140 MG/ML SOAJ Inject 140 mg into the skin every 30 (thirty) days. 07/07/23   Dohmeier, Raoul Byes, MD  ketoconazole  (NIZORAL ) 2 % cream Apply 1 Application topically daily. 03/15/22   Donley Furth, MD  Melatonin 5 MG CAPS Take 5 mg by mouth at bedtime.    [provider]  meloxicam  (MOBIC ) 15 MG tablet Take 1 tablet (15 mg total) by mouth daily. 08/26/22   [place], Betty G, MD  nitroGLYCERIN  (NITROSTAT ) 0.4 MG SL tablet DISSOLVE 1 TABLET UNDER THE  TONGUE EVERY 5 MINUTES AS NEEDED FOR CHEST PAIN. MAX OF 3 TABLETS IN 15 MINUTES. CALL 911 IF PAIN  PERSISTS. 06/27/23   Maudine Sos, MD  pantoprazole  (PROTONIX ) 20 MG tablet TAKE 1 TABLET BY MOUTH DAILY  BEFORE BREAKFAST 06/03/23   Eilleen Grates, MD  pravastatin  (PRAVACHOL ) 20 MG tablet TAKE 1 TABLET BY MOUTH DAILY 07/16/23   Eilleen Grates, MD  rOPINIRole  (REQUIP ) 0.5 MG tablet Take 1 tablet (0.5 mg total) by mouth at bedtime. 07/14/23   [place], Betty G, MD  SUMAtriptan  (IMITREX ) 25 MG tablet Take 1 tablet (25 mg total) by mouth every 2 (two) hours as needed for migraine. May repeat in 2 hours if headache persists or recurs. 07/07/23   Dohmeier, Raoul Byes, MD  topiramate  (TOPAMAX ) 50 MG tablet Take 1 tablet (50 mg total) by mouth 2 (two) times daily. 07/16/23   Dohmeier, Raoul Byes, MD  triamcinolone  cream (KENALOG ) 0.1 % Apply 1 Application topically 2 (two) times daily as needed (rash).    [provider]    Physical Exam    Vital Signs:  Teofil Maniaci does not have vital signs available for review today.  Given telephonic nature of communication, physical exam is limited. AAOx3. NAD. Normal affect.  Speech and respirations are unlabored.  Accessory Clinical Findings    None  Assessment & Plan    1.  Preoperative Cardiovascular Risk Assessment: - Patient's RCRI score is 0.4%  The patient  affirms he has been doing well without any new cardiac symptoms. They are able to achieve 7 METS without cardiac limitations. Therefore, based on ACC/AHA guidelines, the patient would be at acceptable risk for the planned procedure without further cardiovascular testing. The patient was advised that if he develops new symptoms prior to surgery to contact our office to arrange for a follow-up visit, and he verbalized understanding.   The patient was advised that if he develops new symptoms prior to surgery to contact our office to arrange for a follow-up visit, and he verbalized understanding.    A copy of this note will be routed to requesting surgeon.  Time:   Today, I have spent 6 minutes with the patient with telehealth technology discussing medical history, symptoms, and management plan.     Francene Ing, Retha Cast, NP  07/18/2023, 6:59 AM

## 2023-07-18 NOTE — Telephone Encounter (Signed)
 Call placed to Mr. Saxton this morning for scheduled preoperative clearance at 9 AM.  Patient was not available at that time and detailed message left for him to return call at earliest convenience.  Call will be attempted later this morning and if not available will need to reschedule.  Charles Connor, NP

## 2023-07-23 ENCOUNTER — Encounter: Payer: Self-pay | Admitting: Family Medicine

## 2023-07-23 ENCOUNTER — Telehealth: Payer: Self-pay

## 2023-07-23 NOTE — Telephone Encounter (Signed)
 Spoke with PCP, created letter and faxed over to [facility] to give medical clearance. Called and spoke with pt, let him know that we did not receive any surgery clearance forms for him; but we did fax over a clearance letter on our letterhead. Pt verbalized understanding.

## 2023-07-23 NOTE — Telephone Encounter (Signed)
 Fax was never sent to us , left message with Abe Abed to fax it over.

## 2023-07-23 NOTE — Telephone Encounter (Signed)
 Copied from CRM [id number]. Topic: General - Other >> Jul 23, 2023  4:04 PM Alysia Jumbo S wrote: Reason for CRM: Patient states that on May 7TH a fax was sent from [facility] for a surgery clearance. He states the form was never returned and needs to have it returned as soon as possible. Patient requesting a callback from nurse/provider.

## 2023-07-24 ENCOUNTER — Emergency Department (HOSPITAL_BASED_OUTPATIENT_CLINIC_OR_DEPARTMENT_OTHER)

## 2023-07-24 ENCOUNTER — Emergency Department (HOSPITAL_BASED_OUTPATIENT_CLINIC_OR_DEPARTMENT_OTHER)
Admission: EM | Admit: 2023-07-24 | Discharge: 2023-07-25 | Disposition: A | Discharge status: Home / Self Care | Attending: Emergency Medicine | Admitting: Emergency Medicine

## 2023-07-24 ENCOUNTER — Encounter (HOSPITAL_BASED_OUTPATIENT_CLINIC_OR_DEPARTMENT_OTHER): Payer: Self-pay

## 2023-07-24 ENCOUNTER — Ambulatory Visit: Payer: Self-pay

## 2023-07-24 ENCOUNTER — Other Ambulatory Visit: Payer: Self-pay

## 2023-07-24 DIAGNOSIS — N132 Hydronephrosis with renal and ureteral calculous obstruction: Secondary | ICD-10-CM | POA: Diagnosis not present

## 2023-07-24 DIAGNOSIS — R109 Unspecified abdominal pain: Secondary | ICD-10-CM | POA: Diagnosis not present

## 2023-07-24 DIAGNOSIS — N23 Unspecified renal colic: Secondary | ICD-10-CM

## 2023-07-24 DIAGNOSIS — N202 Calculus of kidney with calculus of ureter: Secondary | ICD-10-CM | POA: Diagnosis not present

## 2023-07-24 LAB — URINALYSIS, ROUTINE W REFLEX MICROSCOPIC
Bacteria, UA: NONE SEEN
Bilirubin Urine: NEGATIVE
Glucose, UA: NEGATIVE mg/dL
Ketones, ur: NEGATIVE mg/dL
Leukocytes,Ua: NEGATIVE
Nitrite: NEGATIVE
Protein, ur: NEGATIVE mg/dL
Specific Gravity, Urine: 1.021 (ref 1.005–1.030)
pH: 7.5 (ref 5.0–8.0)

## 2023-07-24 LAB — CBC WITH DIFFERENTIAL/PLATELET
Abs Immature Granulocytes: 0.05 10*3/uL (ref 0.00–0.07)
Basophils Absolute: 0 10*3/uL (ref 0.0–0.1)
Basophils Relative: 0 %
Eosinophils Absolute: 0.1 10*3/uL (ref 0.0–0.5)
Eosinophils Relative: 0 %
HCT: 43.4 % (ref 39.0–52.0)
Hemoglobin: 15.3 g/dL (ref 13.0–17.0)
Immature Granulocytes: 0 %
Lymphocytes Relative: 15 %
Lymphs Abs: 1.8 10*3/uL (ref 0.7–4.0)
MCH: 32.1 pg (ref 26.0–34.0)
MCHC: 35.3 g/dL (ref 30.0–36.0)
MCV: 91 fL (ref 80.0–100.0)
Monocytes Absolute: 1.2 10*3/uL — ABNORMAL HIGH (ref 0.1–1.0)
Monocytes Relative: 10 %
Neutro Abs: 9 10*3/uL — ABNORMAL HIGH (ref 1.7–7.7)
Neutrophils Relative %: 75 %
Platelets: 181 10*3/uL (ref 150–400)
RBC: 4.77 MIL/uL (ref 4.22–5.81)
RDW: 12.8 % (ref 11.5–15.5)
WBC: 12.1 10*3/uL — ABNORMAL HIGH (ref 4.0–10.5)
nRBC: 0 % (ref 0.0–0.2)

## 2023-07-24 LAB — COMPREHENSIVE METABOLIC PANEL WITH GFR
ALT: 34 U/L (ref 0–44)
AST: 35 U/L (ref 15–41)
Albumin: 4.6 g/dL (ref 3.5–5.0)
Alkaline Phosphatase: 70 U/L (ref 38–126)
Anion gap: 13 (ref 5–15)
BUN: 23 mg/dL — ABNORMAL HIGH (ref 6–20)
CO2: 22 mmol/L (ref 22–32)
Calcium: 10.2 mg/dL (ref 8.9–10.3)
Chloride: 104 mmol/L (ref 98–111)
Creatinine, Ser: 1.4 mg/dL — ABNORMAL HIGH (ref 0.61–1.24)
GFR, Estimated: 59 mL/min — ABNORMAL LOW (ref >60)
Glucose, Bld: 98 mg/dL (ref 70–99)
Potassium: 4.8 mmol/L (ref 3.5–5.1)
Sodium: 139 mmol/L (ref 135–145)
Total Bilirubin: 0.6 mg/dL (ref 0.0–1.2)
Total Protein: 7.7 g/dL (ref 6.5–8.1)

## 2023-07-24 NOTE — Telephone Encounter (Signed)
  Chief Complaint: abdominal pain Symptoms: nausea, pain Frequency: constant Pertinent Negatives: Patient denies injury Disposition: [x] ED /[] Urgent Care (no appt availability in office) / [] Appointment(In office/virtual)/ []  [facility]/ [] Home Care/ [] Refused Recommended Disposition /[] [facility]/ []  Follow-up with PCP Additional Notes:  Right sided abdominal pain-severe. Nausea, dry heaving. This pain has been ongoing throughout the day today and intensifying. Chills. Emergency room evaluation advised.    Copied from CRM [id number]. Topic: Clinical - Red Word Triage >> Jul 24, 2023  5:57 PM Baldo Levan wrote: Red Word that prompted transfer to Nurse Triage: Possible fever- cold then hot, Severe pain on the right side stomach, feels like he is going to throw up. Reason for Disposition  [1] SEVERE pain (e.g., excruciating) AND [2] present > 1 hour  Protocols used: Abdominal Pain - Male-A-AH

## 2023-07-24 NOTE — ED Triage Notes (Signed)
 Pt c/o R sided flank pain, dry heaves, "feeling cold" today. No BM today, no known hx kidney stones.

## 2023-07-25 ENCOUNTER — Other Ambulatory Visit: Payer: Self-pay | Admitting: Urology

## 2023-07-25 DIAGNOSIS — N201 Calculus of ureter: Secondary | ICD-10-CM | POA: Diagnosis not present

## 2023-07-25 DIAGNOSIS — N200 Calculus of kidney: Secondary | ICD-10-CM | POA: Diagnosis not present

## 2023-07-25 DIAGNOSIS — N132 Hydronephrosis with renal and ureteral calculous obstruction: Secondary | ICD-10-CM | POA: Diagnosis not present

## 2023-07-25 MED ORDER — KETOROLAC TROMETHAMINE 10 MG PO TABS: 10.0000 mg | ORAL_TABLET | Freq: Four times a day (QID) | ORAL | 0 refills | Status: AC | PRN

## 2023-07-25 MED ORDER — KETOROLAC TROMETHAMINE 60 MG/2ML IM SOLN
60.0000 mg | Freq: Once | INTRAMUSCULAR | Status: AC
  Administered 2023-07-25: 60 mg via INTRAMUSCULAR
  Filled 2023-07-25: qty 2

## 2023-07-25 NOTE — ED Provider Notes (Signed)
 [facility] EMERGENCY DEPARTMENT AT [facility] Provider Note   CSN: [id number] Arrival date & time: 07/24/23  1919     History  Chief Complaint  Patient presents with   Abdominal Pain    Steven Vincent is a 58 y.o. male.  Patient presents to the emergency department for flank pain.  No known history of kidney stones.  He has had some nausea and dry heaves.  No urinary symptoms, no diarrhea or constipation.       Home Medications Prior to Admission medications   Medication Sig Start Date End Date Taking? Authorizing Provider  ketorolac  (TORADOL ) 10 MG tablet Take 1 tablet (10 mg total) by mouth every 6 (six) hours as needed. 07/25/23  Yes Murat Rideout, Marine Sia, MD  celecoxib (CELEBREX) 200 MG capsule  01/10/23   [provider]  diclofenac  Sodium (VOLTAREN ) 1 % GEL Apply 1 application. topically 4 (four) times daily as needed (pain).    [provider]  dronedarone  (MULTAQ ) 400 MG tablet TAKE 1 TABLET BY MOUTH TWICE  DAILY WITH A MEAL 05/02/23   Eilleen Grates, MD  Erenumab -aooe (AIMOVIG ) 140 MG/ML SOAJ Inject 140 mg into the skin every 30 (thirty) days. 07/07/23   Dohmeier, Raoul Byes, MD  ketoconazole  (NIZORAL ) 2 % cream Apply 1 Application topically daily. 03/15/22   Donley Furth, MD  Melatonin 5 MG CAPS Take 5 mg by mouth at bedtime.    [provider]  meloxicam  (MOBIC ) 15 MG tablet Take 1 tablet (15 mg total) by mouth daily. 08/26/22   [place], Betty G, MD  nitroGLYCERIN  (NITROSTAT ) 0.4 MG SL tablet DISSOLVE 1 TABLET UNDER THE  TONGUE EVERY 5 MINUTES AS NEEDED FOR CHEST PAIN. MAX OF 3 TABLETS IN 15 MINUTES. CALL 911 IF PAIN  PERSISTS. 06/27/23   Maudine Sos, MD  pantoprazole  (PROTONIX ) 20 MG tablet TAKE 1 TABLET BY MOUTH DAILY  BEFORE BREAKFAST 06/03/23   Eilleen Grates, MD  pravastatin  (PRAVACHOL ) 20 MG tablet TAKE 1 TABLET BY MOUTH DAILY 07/16/23   Eilleen Grates, MD  rOPINIRole  (REQUIP ) 0.5 MG tablet Take 1 tablet (0.5 mg total) by mouth  at bedtime. 07/14/23   [place], Betty G, MD  SUMAtriptan  (IMITREX ) 25 MG tablet Take 1 tablet (25 mg total) by mouth every 2 (two) hours as needed for migraine. May repeat in 2 hours if headache persists or recurs. 07/07/23   Dohmeier, Raoul Byes, MD  topiramate  (TOPAMAX ) 50 MG tablet Take 1 tablet (50 mg total) by mouth 2 (two) times daily. 07/16/23   Dohmeier, Raoul Byes, MD  triamcinolone  cream (KENALOG ) 0.1 % Apply 1 Application topically 2 (two) times daily as needed (rash).    [provider]      Allergies    Delsym [dextromethorphan], Aspirin , Mucinex  d [pseudoephedrine-guaifenesin  er], and Ibuprofen     Review of Systems   Review of Systems  Physical Exam Updated Vital Signs BP (!) 151/90   Pulse (!) 56   Temp 98.4 F (36.9 C)   Resp 18   Ht 6\' 2"  (1.88 m)   Wt 133.8 kg   SpO2 96%   BMI 37.88 kg/m  Physical Exam Vitals and nursing note reviewed.  Constitutional:      General: He is not in acute distress.    Appearance: He is well-developed.  HENT:     Head: Normocephalic and atraumatic.     Mouth/Throat:     Mouth: Mucous membranes are moist.  Eyes:     General: Vision grossly intact. Gaze aligned  appropriately.     Extraocular Movements: Extraocular movements intact.     Conjunctiva/sclera: Conjunctivae normal.  Cardiovascular:     Rate and Rhythm: Normal rate and regular rhythm.     Pulses: Normal pulses.     Heart sounds: Normal heart sounds, S1 normal and S2 normal. No murmur heard.    No friction rub. No gallop.  Pulmonary:     Effort: Pulmonary effort is normal. No respiratory distress.     Breath sounds: Normal breath sounds.  Abdominal:     Palpations: Abdomen is soft.     Tenderness: There is no abdominal tenderness. There is no guarding or rebound.     Hernia: No hernia is present.  Musculoskeletal:        General: No swelling.     Cervical back: Full passive range of motion without pain, normal range of motion and neck supple. No pain with movement,  spinous process tenderness or muscular tenderness. Normal range of motion.     Right lower leg: No edema.     Left lower leg: No edema.  Skin:    General: Skin is warm and dry.     Capillary Refill: Capillary refill takes less than 2 seconds.     Findings: No ecchymosis, erythema, lesion or wound.  Neurological:     Mental Status: He is alert and oriented to person, place, and time.     GCS: GCS eye subscore is 4. GCS verbal subscore is 5. GCS motor subscore is 6.     Cranial Nerves: Cranial nerves 2-12 are intact.     Sensory: Sensation is intact.     Motor: Motor function is intact. No weakness or abnormal muscle tone.     Coordination: Coordination is intact.  Psychiatric:        Mood and Affect: Mood normal.        Speech: Speech normal.        Behavior: Behavior normal.     ED Results / Procedures / Treatments   Labs (all labs ordered are listed, but only abnormal results are displayed) Labs Reviewed  URINALYSIS, ROUTINE W REFLEX MICROSCOPIC - Abnormal; Notable for the following components:      Result Value   Hgb urine dipstick MODERATE (*)    All other components within normal limits  CBC WITH DIFFERENTIAL/PLATELET - Abnormal; Notable for the following components:   WBC 12.1 (*)    Neutro Abs 9.0 (*)    Monocytes Absolute 1.2 (*)    All other components within normal limits  COMPREHENSIVE METABOLIC PANEL WITH GFR - Abnormal; Notable for the following components:   BUN 23 (*)    Creatinine, Ser 1.40 (*)    GFR, Estimated 59 (*)    All other components within normal limits    EKG None  Radiology CT ABDOMEN PELVIS WO CONTRAST Result Date: 07/24/2023 CLINICAL DATA:  Acute nonlocalized abdominal pain EXAM: CT ABDOMEN AND PELVIS WITHOUT CONTRAST TECHNIQUE: Multidetector CT imaging of the abdomen and pelvis was performed following the standard protocol without IV contrast. RADIATION DOSE REDUCTION: This exam was performed according to the departmental dose-optimization  program which includes automated exposure control, adjustment of the mA and/or kV according to patient size and/or use of iterative reconstruction technique. COMPARISON:  None Available. FINDINGS: Lower chest: No acute abnormality. Hepatobiliary: Unremarkable liver. Normal gallbladder. No biliary dilation. Pancreas: Unremarkable. Spleen: Unremarkable. Adrenals/Urinary Tract: Normal adrenal glands. Bilateral nephrolithiasis. No left hydronephrosis. There is a 8 mm stone in the proximal  right ureter with moderate upstream hydroureteronephrosis. Asymmetric perinephric stranding about the right kidney. Bladder is unremarkable. Stomach/Bowel: Normal caliber large and small bowel. No bowel wall thickening. The appendix is normal.Stomach is within normal limits. Vascular/Lymphatic: No significant vascular findings are present. No enlarged abdominal or pelvic lymph nodes. Reproductive: Unremarkable. Other: No free intraperitoneal fluid or air. Musculoskeletal: No acute fracture. IMPRESSION: 1. There is an 8 mm stone in the proximal right ureter with moderate upstream hydroureteronephrosis. 2. Bilateral nephrolithiasis. Electronically Signed   By: Rozell Cornet M.D.   On: 07/24/2023 21:50    Procedures Procedures    Medications Ordered in ED Medications  ketorolac  (TORADOL ) injection 60 mg (has no administration in time range)    ED Course/ Medical Decision Making/ A&P                                 Medical Decision Making Amount and/or Complexity of Data Reviewed Labs: ordered.  Risk Prescription drug management.   Presents for right-sided flank pain.  CT scan shows proximal ureteral stone on the right that explains his pain.  I had a long conversation with the patient about pain control.  He is adamant that he cannot tolerate any type of pain medication other than Advil .  No fever, tachycardia or infectious symptoms.  Will treat with Toradol .  Patient is to call urology in the morning to obtain  prompt follow-up.  He was also counseled that if he is requiring further evaluation he should be seen in the ED at [facility], preferably during the daytime.        Final Clinical Impression(s) / ED Diagnoses Final diagnoses:  Renal colic on right side    Rx / DC Orders ED Discharge Orders          Ordered    ketorolac  (TORADOL ) 10 MG tablet  Every 6 hours PRN        07/25/23 0013              Stony Stegmann J, MD 07/25/23 [phone]

## 2023-07-29 ENCOUNTER — Encounter (HOSPITAL_COMMUNITY): Payer: Self-pay | Admitting: Urology

## 2023-07-29 NOTE — Progress Notes (Signed)
 Spoke w/ via phone for pre-op interview---Pt Lab needs dos---- KUB        Lab results------ COVID test --Not indicated---patient states asymptomatic no test needed Arrive at -------0800 NPO after MN  Pre-Surgery Ensure or G2:  Med rec completed Medications to take morning of surgery ----protonix , topamax  if needed, requip , multaq , imitrex , if needed, kenalog , aimovig  if needed- Diabetic medication -----  GLP1 agonist last dose: GLP1 instructions:  Patient instructed no nail polish to be worn day of surgery Patient instructed to bring photo id and insurance card day of surgery Patient aware to have Driver (ride ) / caregiver    for 24 hours after surgery - Will bring information day of procedure Patient Special Instructions -----Laxative of choice on Thursday, hydrate well, eat light meal that evening, will need responsible adult for 1st 24 hrs and driver Pre-Op special Instructions -----Steven Vincent and Litho  Patient verbalized understanding of instructions that were given at this phone interview. Patient denies chest pain, sob, fever, cough at the interview.

## 2023-07-29 NOTE — Progress Notes (Signed)
 Left voicemail stating to arrive at 0800 at main entrance of [facility] on Friday, stop all aspirin  and aspirin  products, stop celebrex, toradol , mobic , voltaren , bring driver's license, insurance card, blue folder, take a laxative of choice on Thursday, hydrate well and eat light meal that evening and nothing to eat or drink after midnight Thursday.

## 2023-08-01 ENCOUNTER — Other Ambulatory Visit: Payer: Self-pay

## 2023-08-01 ENCOUNTER — Ambulatory Visit (HOSPITAL_COMMUNITY)
Admission: RE | Admit: 2023-08-01 | Discharge: 2023-08-01 | Disposition: A | Discharge status: Home / Self Care | Attending: Urology | Admitting: Urology

## 2023-08-01 ENCOUNTER — Ambulatory Visit (HOSPITAL_COMMUNITY)

## 2023-08-01 ENCOUNTER — Encounter (HOSPITAL_COMMUNITY): Payer: Self-pay | Admitting: Urology

## 2023-08-01 ENCOUNTER — Encounter (HOSPITAL_COMMUNITY)
Admission: RE | Disposition: A | Discharge status: Home / Self Care | Payer: Self-pay | Source: Home / Self Care | Attending: Urology

## 2023-08-01 DIAGNOSIS — N202 Calculus of kidney with calculus of ureter: Secondary | ICD-10-CM | POA: Diagnosis not present

## 2023-08-01 DIAGNOSIS — N201 Calculus of ureter: Secondary | ICD-10-CM | POA: Diagnosis not present

## 2023-08-01 DIAGNOSIS — G4733 Obstructive sleep apnea (adult) (pediatric): Secondary | ICD-10-CM | POA: Insufficient documentation

## 2023-08-01 HISTORY — DX: Cerebral infarction, unspecified: I63.9

## 2023-08-01 HISTORY — PX: EXTRACORPOREAL SHOCK WAVE LITHOTRIPSY: SHX1557

## 2023-08-01 SURGERY — LITHOTRIPSY, ESWL
Anesthesia: LOCAL | Laterality: Right

## 2023-08-01 MED ORDER — BISACODYL 5 MG PO TBEC: 5.0000 mg | DELAYED_RELEASE_TABLET | Freq: Every day | ORAL | Status: DC | PRN

## 2023-08-01 MED ORDER — TRAMADOL HCL 50 MG PO TABS: 50.0000 mg | ORAL_TABLET | Freq: Four times a day (QID) | ORAL | 0 refills | Status: DC | PRN

## 2023-08-01 MED ORDER — DIPHENHYDRAMINE HCL 25 MG PO CAPS
25.0000 mg | ORAL_CAPSULE | ORAL | Status: AC
  Administered 2023-08-01: 25 mg via ORAL
  Filled 2023-08-01: qty 1

## 2023-08-01 MED ORDER — DIAZEPAM 5 MG PO TABS
10.0000 mg | ORAL_TABLET | ORAL | Status: AC
  Administered 2023-08-01: 10 mg via ORAL
  Filled 2023-08-01: qty 2

## 2023-08-01 MED ORDER — ONDANSETRON 8 MG PO TBDP: 8.0000 mg | ORAL_TABLET | Freq: Three times a day (TID) | ORAL | 0 refills | Status: AC | PRN

## 2023-08-01 MED ORDER — CIPROFLOXACIN HCL 500 MG PO TABS
500.0000 mg | ORAL_TABLET | ORAL | Status: AC
  Administered 2023-08-01: 500 mg via ORAL
  Filled 2023-08-01: qty 1

## 2023-08-01 MED ORDER — SODIUM CHLORIDE 0.9 % IV SOLN: INTRAVENOUS | Status: DC

## 2023-08-01 NOTE — H&P (Signed)
 Steven Vincent is an 58 y.o. male.    Chief Complaint: Pre-OP RIGHT Shockwave Lithotripsy  HPI:   1 - RIGHT Proximal Ureteral Stone - 5mm Rt proximal stone at L3-L4 interspace on ER CT 06/2023. NO passage with medical therapy. Scattered small non-obstructing renal stones as well.   PMH sig for OSA/Inspire, Sinus brady (follows hochrein, not limiting).  Today "Steven Vincent" is seen to proceed with RIGHT shockwave lithotripsy. No interval fevers. Most recent UA without infectious parameters.  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   Dysrhythmia    bradycardia   HLD (hyperlipidemia)    Low back pain    Migraines    OSA (obstructive sleep apnea) 04/10/2017   Personality disorder (HCC)    Seizures (HCC)    Stroke (HCC)    short term memory difficulty    Past Surgical History:  Procedure Laterality Date   COLONOSCOPY     normal   DRUG INDUCED ENDOSCOPY N/A 03/07/2021   Procedure: DRUG INDUCED SLEEP ENDOSCOPY;  Surgeon: Virgina Grills, MD;  Location: [facility];  Service: ENT;  Laterality: N/A;   IMPLANTATION OF HYPOGLOSSAL NERVE STIMULATOR Right 05/16/2021   Procedure: IMPLANTATION OF HYPOGLOSSAL NERVE STIMULATOR;  Surgeon: Virgina Grills, MD;  Location: [facility] OR;  Service: ENT;  Laterality: Right;   PATELLA-FEMORAL ARTHROPLASTY Right 12/25/2021   Procedure: RIGHT PATELLOFEMORAL REPLACEMENT;  Surgeon: Dayne Even, MD;  Location: [facility];  Service: Orthopedics;  Laterality: Right;   TOOTH EXTRACTION  12/27/2014   lower left   TOTAL KNEE ARTHROPLASTY Left 11/28/2020   Procedure: LEFT KNEE PATELLAFEMORL REPLACEMENT;  Surgeon: Dayne Even, MD;  Location: [facility];  Service: Orthopedics;  Laterality: Left;    Family History  Problem Relation Age of Onset   Heart disease Father        defibrilator   Hyperlipidemia Father    Hypertension Father    Alzheimer's disease Father    Other Father         stents in legs   Aneurysm Mother        brain   Hypertension Brother    Stroke Brother        X2   Social History:  reports that he quit smoking about 33 years ago. His smoking use included cigarettes. He started smoking about 34 years ago. He has a 1 pack-year smoking history. He has been exposed to tobacco smoke. He has never used smokeless tobacco. He reports that he does not drink alcohol and does not use drugs.  Allergies:  Allergies  Allergen Reactions   Delsym [Dextromethorphan] Other (See Comments)    Gets loopy/memory loss   Aspirin  Nausea And Vomiting    GI upset    Mucinex  D [Pseudoephedrine-Guaifenesin  Er] Hypertension   Ibuprofen  Other (See Comments)    Stomach irritation    No medications prior to admission.    No results found for this or any previous visit (from the past 48 hours). No results found.  Review of Systems  Constitutional:  Negative for chills and fever.  Genitourinary:  Positive for flank pain.  All other systems reviewed and are negative.   There were no vitals taken for this visit. Physical Exam Vitals reviewed.  HENT:     Head: Normocephalic.  Eyes:     Pupils: Pupils are equal, round, and reactive to light.  Cardiovascular:     Rate  and Rhythm: Normal rate.  Abdominal:     Comments: Moderate truncal obesity.   Genitourinary:    Comments: Mild Rt CVAT at present Musculoskeletal:        General: Normal range of motion.     Cervical back: Normal range of motion.  Skin:    General: Skin is warm.  Neurological:     General: No focal deficit present.     Mental Status: He is alert.  Psychiatric:        Mood and Affect: Mood normal.      Assessment/Plan  Proceed as planned with RIGHT shockwave lithoripsy. Risks, benefits, alternatives, expected peri-op course discussed previously and reiterated today.   Melody Spurling., MD 08/01/2023, 6:50 AM

## 2023-08-01 NOTE — Brief Op Note (Signed)
 08/01/2023  10:51 AM  PATIENT:  Steven Vincent  58 y.o. male  PRE-OPERATIVE DIAGNOSIS:  RIGHT URETERAL CALCULUS  POST-OPERATIVE DIAGNOSIS:  * No post-op diagnosis entered *  PROCEDURE:  Procedure(s) with comments: LITHOTRIPSY, ESWL (Right) - RIGHT EXTRACORPOREAL SHOCKWAVE LITHOTRIPSY  SURGEON:  Surgeons and Role:    * Manny, Harvey Linen., MD - Primary  PHYSICIAN ASSISTANT:   ASSISTANTS: none   ANESTHESIA:   MAC  EBL:  minimal   BLOOD ADMINISTERED:none  DRAINS: none   LOCAL MEDICATIONS USED:  NONE  SPECIMEN:  No Specimen  DISPOSITION OF SPECIMEN:  N/A  COUNTS:  YES  TOURNIQUET:  * No tourniquets in log *  DICTATION: .Note written in paper chart  PLAN OF CARE: Admit to inpatient   PATIENT DISPOSITION:  Short Stay   Delay start of Pharmacological VTE agent (>24hrs) due to surgical blood loss or risk of bleeding: not applicable

## 2023-08-01 NOTE — Discharge Instructions (Addendum)
1 - You may have urinary urgency (bladder spasms), pass small stone fragments, and bloody urine on / off for up to 2 weeks. This is normal. ° °2 - Call MD or go to ER for fever >102, severe pain / nausea / vomiting not relieved by medications, or acute change in medical status ° °

## 2023-08-02 ENCOUNTER — Encounter (HOSPITAL_COMMUNITY): Payer: Self-pay | Admitting: Urology

## 2023-08-05 ENCOUNTER — Other Ambulatory Visit: Payer: Self-pay

## 2023-08-05 DIAGNOSIS — G2581 Restless legs syndrome: Secondary | ICD-10-CM

## 2023-08-05 MED ORDER — ROPINIROLE HCL 0.5 MG PO TABS: 0.5000 mg | ORAL_TABLET | Freq: Every day | ORAL | 1 refills | Status: DC

## 2023-08-07 DIAGNOSIS — G5601 Carpal tunnel syndrome, right upper limb: Secondary | ICD-10-CM | POA: Diagnosis not present

## 2023-08-08 DIAGNOSIS — N202 Calculus of kidney with calculus of ureter: Secondary | ICD-10-CM | POA: Diagnosis not present

## 2023-08-11 DIAGNOSIS — N201 Calculus of ureter: Secondary | ICD-10-CM | POA: Diagnosis not present

## 2023-09-03 ENCOUNTER — Ambulatory Visit

## 2023-09-15 ENCOUNTER — Telehealth: Payer: Self-pay | Admitting: Family Medicine

## 2023-09-15 NOTE — Telephone Encounter (Signed)
 Patient dropped off document FL2, to be filled out by provider. Patient requested to send it back via Call Patient to pick up within 5-days. Document is located in providers tray at front office.Please advise at Mobile [phone] (mobile)

## 2023-09-15 NOTE — Telephone Encounter (Signed)
 In your bin to be signed.

## 2023-09-16 NOTE — Telephone Encounter (Signed)
 Pt is aware form is completed; copy sent to scan & original up at the front.

## 2023-09-25 ENCOUNTER — Telehealth: Payer: Self-pay | Admitting: Neurology

## 2023-09-25 NOTE — Telephone Encounter (Signed)
 Pt called needing to speak to there RN regarding his topiramate  (TOPAMAX ) 50 MG tablet  Pharmacy will not fill his 25mg  that he takes at night so it will equal out to the 75mg  a day he takes of the topiramate  (TOPAMAX ) 50 MG tablet. He states that the 75mg  was helping and his memory was doing good. Pt has been out for a week. Please advise.

## 2023-09-25 NOTE — Telephone Encounter (Signed)
 Spoke w/Pt who reported he did better on the topiramate  dosage of 50 mg in am and 25 mg at night. He said he has noticed his memory issues again since taking the 50 mg BID. Pt is requesting to go back to previous dosing of 50 mg in AM and 25 mg at night. Pt reported if sending a new script send to [facility] Delivery. Informed Pt will send message to provider. Pt voiced thanks for the call back.

## 2023-09-25 NOTE — Telephone Encounter (Signed)
 Per conversation with the pt with other RN, pt was able to tolerate the 50 mg dose in the morning and the 25 mg dose in the evening. Will change the script to reflect this dose and make MD aware.

## 2023-09-26 MED ORDER — TOPIRAMATE 25 MG PO TABS: ORAL_TABLET | ORAL | 1 refills | Status: AC

## 2023-09-26 MED ORDER — AIMOVIG 140 MG/ML ~~LOC~~ SOAJ: SUBCUTANEOUS | 3 refills | Status: DC

## 2023-09-26 NOTE — Telephone Encounter (Signed)
 I changed the Topiramate  dose  per patients request- he felt he couldn't tolerate 50 mg bid , developed memory trouble.  This is highly subjective but I changed the dose back to 50 mg am and 25 mg in pm.  RV in 12 month with MMSE.

## 2023-09-26 NOTE — Telephone Encounter (Addendum)
 FYI:   Dear Mr. Shifflett,  I saw that you were treated for kidney stones last month.  I reviewed your chart for the requested reduction in Topamax  dose based on your memory concerns - Topiramate  is known to cause renal stones is some patients. The medication may not just need to be reduced but possibly replaced.   I have thought about alternatives , not urgent but for the long term:  In your next visit at [facility] we need to speak about changing  possibly to Depakote or Lamictal , both are seizure meds used for migraine prevention and treat mood as well.   Since you have high BP  we can also consider Verapamil, a blood pressure medication.   As a smoker with sleep apnea  ( AHI 22) and hypoxia  ( nadir 60%) , I would not want you on a beta blocker.  Addendum: I noted you are on AIMOVIG  already- so we should switch to Lamictal or Depakote .   I will share this note on your patient record with your primary physician ,  Dedra Gores, MD

## 2023-09-26 NOTE — Addendum Note (Signed)
 Addended by: CHALICE SAUNAS on: 09/26/2023 03:47 PM   Modules accepted: Orders

## 2023-10-02 ENCOUNTER — Ambulatory Visit

## 2023-10-08 ENCOUNTER — Other Ambulatory Visit: Payer: Self-pay | Admitting: Cardiology

## 2023-10-08 ENCOUNTER — Other Ambulatory Visit: Payer: Self-pay | Admitting: Family Medicine

## 2023-10-08 DIAGNOSIS — G2581 Restless legs syndrome: Secondary | ICD-10-CM

## 2023-10-16 ENCOUNTER — Ambulatory Visit (INDEPENDENT_AMBULATORY_CARE_PROVIDER_SITE_OTHER): Admitting: Pulmonary Disease

## 2023-10-16 ENCOUNTER — Encounter (HOSPITAL_BASED_OUTPATIENT_CLINIC_OR_DEPARTMENT_OTHER): Payer: Self-pay | Admitting: Pulmonary Disease

## 2023-10-16 VITALS — BP 138/82 | HR 61 | Ht 74.0 in | Wt 290.9 lb

## 2023-10-16 DIAGNOSIS — E669 Obesity, unspecified: Secondary | ICD-10-CM | POA: Diagnosis not present

## 2023-10-16 DIAGNOSIS — G4733 Obstructive sleep apnea (adult) (pediatric): Secondary | ICD-10-CM

## 2023-10-16 DIAGNOSIS — Z9682 Presence of neurostimulator: Secondary | ICD-10-CM

## 2023-10-16 NOTE — Progress Notes (Signed)
 Subjective:    Patient ID: Steven Vincent, male    DOB: Apr 14, 1965, 58 y.o.   MRN: [id number]   58 yo male for FU of OSA and Insomnia (CPAP intolerant) s/p Inspire device implant (05/16/21)  PMH : seizure disorder Patient is a Cytogeneticist, gold frances, Steven Vincent is [place].  (Patient has some intellectual disability reads and writes at a third grade level.    His device was activated in 2023, he was seen 05/2022 and noted to be set with a range of 0.7 to 1.1 V with amplitude of 0.9 06/2022 amplitude was 0.8 V   Start delay 30 minutes, pause time 20 minutes duration 8 hours   12/2022 at level 2 = 0.8V >> overstimulated at this level with tongue deviation to the left  Optimal level was 0.6= level 2.  Changed to 0.5 V lower limit, upper limit 0.9 V   Start delay  to 30 minutes, pause time 25 minutes, duration 8hours  05/2023 incoming 0.6 V HST >> residual AHI of 11/hour on inspire  0.6 V   Reprogrammed his device to 0.7 V, His range remains 0.5 to 0.9 V     Discussed the use of AI scribe software for clinical note transcription with the patient, who gave verbal consent to proceed.  History of Present Illness      Significant tests/ events reviewed    HST 04/2023 residual AHI of 11/hour on inspire  0.6 V    PSG 08/15/21 >> optimal level 0.9 V with AHI/hour, incoming amplitude was 0.7, range study was 0.5 to 0.9 V PSG 02/15/17 >> AHI 6.9, SpO2 low 85%.  Supine AHI 10.5, REM AHI 24.3. HST 02/26/18 >> AHI 11.6, SpO2 low 79% HST 07/07/18 >> AHI 7.7, SpO2 low 83% Auto CPAP 11/20/19 to 12/19/19 >> used on 12 of 30 nights with average 2 hrs 9 min.  Average AHI 4.3 with median CPAP 11 and 95 th percentile CPAP 14 cm H2O CPAP titration 01/31/20 >> CPAP 13 >> AHI 0, no REM.  Review of Systems  neg for any significant sore throat, dysphagia, itching, sneezing, nasal congestion or excess/ purulent secretions, fever, chills, sweats, unintended wt loss, pleuritic or exertional  cp, hempoptysis, orthopnea pnd or change in chronic leg swelling. Also denies presyncope, palpitations, heartburn, abdominal pain, nausea, vomiting, diarrhea or change in bowel or urinary habits, dysuria,hematuria, rash, arthralgias, visual complaints, headache, numbness weakness or ataxia.      Objective:   Physical Exam  Gen. Pleasant, obese, in no distress ENT - no lesions, no post nasal drip Neck: No JVD, no thyromegaly, no carotid bruits Lungs: no use of accessory muscles, no dullness to percussion, decreased without rales or rhonchi  Cardiovascular: Rhythm regular, heart sounds  normal, no murmurs or gallops, no peripheral edema Musculoskeletal: No deformities, no cyanosis or clubbing , no tremors  Tongue deviates o left @ 0.8 V , checked with programmer     Assessment & Plan:   Assessment and Plan Assessment & Plan  Assessment and Plan    Obstructive sleep apnea, status post hypoglossal nerve stimulator Obstructive sleep apnea managed with a hypoglossal nerve stimulator. Reports persistent fatigue and falling asleep in his chair. Experiences about eleven apnea events per night, improved but higher than the ideal of five events. We  adjustment to pulse width ratio to 60/40 to allow better tolerance of higher stimulation level. Reports new setting feels smoother and less jarring. Higher level associated with fewer apnea events. -  Adjust stimulator to level three with a pulse width ratio of 60/40. - Instruct to try the new setting /0.7V  and monitor tolerance. - If level three is not tolerated, revert to level two. - Schedule follow-up in six months.  Obesity Obesity with recent weight gain attributed to decreased activity due to back pain. Motivated to work on weight loss with access to a fitness center and community walking group. - Encourage participation in community walking group and use of fitness center. - Advise on gradual increase in physical activity as tolerated.

## 2023-10-16 NOTE — Patient Instructions (Addendum)
  VISIT SUMMARY: Today, we discussed your follow-up after the hypoglossal nerve stimulator implant for obstructive sleep apnea. We also addressed your concerns about persistent fatigue and recent weight gain.  YOUR PLAN: -OBSTRUCTIVE SLEEP APNEA: Obstructive sleep apnea is a condition where your airway becomes blocked during sleep, causing breathing pauses. We adjusted your hypoglossal nerve stimulator to level three /0.7 V  with a pulse width ratio of 60/40 to help reduce apnea events. Please try this new setting and monitor how well you tolerate it. If you find it uncomfortable, you can revert to level two. We will follow up in six months to assess your progress.  -OBESITY: Obesity is a condition characterized by excessive body weight. Your recent weight gain is likely due to decreased activity from back pain. We encourage you to join a community walking group and use your fitness center. Gradually increase your physical activity as much as you can tolerate.  INSTRUCTIONS: Please try the new setting on your hypoglossal nerve stimulator and monitor your tolerance. If you cannot tolerate level three, revert to level two. We will have a follow-up appointment in six months.                      Contains text generated by Abridge.                                 Contains text generated by Abridge.

## 2023-10-22 ENCOUNTER — Ambulatory Visit: Payer: Self-pay

## 2023-10-22 DIAGNOSIS — R197 Diarrhea, unspecified: Secondary | ICD-10-CM | POA: Diagnosis not present

## 2023-10-22 DIAGNOSIS — K529 Noninfective gastroenteritis and colitis, unspecified: Secondary | ICD-10-CM | POA: Diagnosis not present

## 2023-10-22 NOTE — Telephone Encounter (Signed)
 FYI Only or Action Required?: FYI only for provider.  Patient was last seen in primary care on 07/14/2023 by [place], Betty G, MD.  Called Nurse Triage reporting No chief complaint on file..  Symptoms began several days ago.  Interventions attempted: Rest, hydration, or home remedies.  Symptoms are: gradually worsening.  Triage Disposition: See Physician Within 24 Hours  Patient/caregiver understands and will follow disposition?: Yes Copied from CRM #[id number]. Topic: Clinical - Red Word Triage >> Oct 22, 2023 11:38 AM Porter L wrote: Red Word that prompted transfer to Nurse Triage:    Patient got milk on sale last Saturday and ever since its given him diarrhea and been throwing up everyday. The throwing up has since but the diarrhea isnt getting any better and worsening but patient has no stomach pain with this. Theres also a lot of gas he said. Asking what he should take for this . Reason for Disposition  [1] Recent antibiotic therapy (i.e., within last 2 months) AND [2] diarrhea present > 3 days since antibiotic was stopped  Answer Assessment - Initial Assessment Questions Patient sates he ate a bowl of cereal with milk that was on sale. Symptoms started shortly after eating this for dinner and he had some chest pain and felt hot at the time.Vomiting and diarrhea started Saturday with episode lasting 3 hours. Pt states he is no longer vomiting. He states he has hd diarrhea every day no matter what he eats and this starts at 6pm every night. Pt referred to UC, states he has an appointment scheduled tomorrow for different office. Pt states he will go to closest UC on [address]. Pt denied having an appointment for UC made for him, he states he will just walk in.    1. DIARRHEA SEVERITY: How bad is the diarrhea? How many more stools have you had in the past 24 hours than normal?      3 - 4 stools  2. ONSET: When did the diarrhea begin?      Saturday  3. STOOL DESCRIPTION:   How loose or watery is the diarrhea? What is the stool color? Is there any blood or mucous in the stool?     No mucus or blood states it is normal color.  4. VOMITING: Are you also vomiting? If Yes, ask: How many times in the past 24 hours?      No  5. ABDOMEN PAIN: Are you having any abdomen pain? If Yes, ask: What does it feel like? (e.g., crampy, dull, intermittent, constant)      no 6. ABDOMEN PAIN SEVERITY: If present, ask: How bad is the pain?  (e.g., Scale 1-10; mild, moderate, or severe)     N/A  7. ORAL INTAKE: If vomiting, Have you been able to drink liquids? How much liquids have you had in the past 24 hours?     Yes,  8. HYDRATION: Any signs of dehydration? (e.g., dry mouth [not just dry lips], too weak to stand, dizziness, new weight loss) When did you last urinate?     Yes, feels like lips are dry.  9. EXPOSURE: Have you traveled to a foreign country recently? Have you been exposed to anyone with diarrhea? Could you have eaten any food that was spoiled?     No  10. ANTIBIOTIC USE: Are you taking antibiotics now or have you taken antibiotics in the past 2 months?       Last week on antibiotics  11. OTHER SYMPTOMS: Do you  have any other symptoms? (e.g., fever, blood in stool)       Only had a fever the first day symptoms started.  Protocols used: [facility]

## 2023-10-23 ENCOUNTER — Encounter: Payer: Self-pay | Admitting: Dermatology

## 2023-10-23 ENCOUNTER — Ambulatory Visit: Payer: 59 | Admitting: Dermatology

## 2023-10-23 VITALS — BP 141/89 | HR 63

## 2023-10-23 DIAGNOSIS — B079 Viral wart, unspecified: Secondary | ICD-10-CM

## 2023-10-23 DIAGNOSIS — D1801 Hemangioma of skin and subcutaneous tissue: Secondary | ICD-10-CM

## 2023-10-23 DIAGNOSIS — Z1283 Encounter for screening for malignant neoplasm of skin: Secondary | ICD-10-CM | POA: Diagnosis not present

## 2023-10-23 DIAGNOSIS — D229 Melanocytic nevi, unspecified: Secondary | ICD-10-CM

## 2023-10-23 DIAGNOSIS — L821 Other seborrheic keratosis: Secondary | ICD-10-CM

## 2023-10-23 DIAGNOSIS — B078 Other viral warts: Secondary | ICD-10-CM | POA: Diagnosis not present

## 2023-10-23 DIAGNOSIS — W908XXA Exposure to other nonionizing radiation, initial encounter: Secondary | ICD-10-CM

## 2023-10-23 DIAGNOSIS — L578 Other skin changes due to chronic exposure to nonionizing radiation: Secondary | ICD-10-CM

## 2023-10-23 DIAGNOSIS — L814 Other melanin hyperpigmentation: Secondary | ICD-10-CM | POA: Diagnosis not present

## 2023-10-23 DIAGNOSIS — D492 Neoplasm of unspecified behavior of bone, soft tissue, and skin: Secondary | ICD-10-CM

## 2023-10-23 DIAGNOSIS — D485 Neoplasm of uncertain behavior of skin: Secondary | ICD-10-CM

## 2023-10-23 NOTE — Patient Instructions (Addendum)

## 2023-10-23 NOTE — Progress Notes (Signed)
 Total Body Skin Exam (TBSE) Visit   Subjective  Steven Vincent is a 58 y.o. male who presents for the following: Skin Cancer Screening and Full Body Skin Exam  Patient presents today for follow up visit for TBSE. Patient was last evaluated on 03/25/23. Patient denies medication changes. Patient reports he  does not have spots, moles and lesions of concern to be evaluated. Patient reports throughout his lifetime he  has had moderate sun exposure. Currently, patient reports if he  has excessive sun exposure, he  does apply sunscreen and/or wears protective coverings. Patient reports he  has hx of bx (Left Foot Dermatofibroma). Patient denies  family history of skin cancers. The patient has spots, moles and lesions to be evaluated, some may be new or changing and the patient has concerns that these could be cancer.  The following portions of the chart were reviewed this encounter and updated as appropriate: medications, allergies, medical history  Review of Systems:  No other skin or systemic complaints except as noted in HPI or Assessment and Plan.  Objective  Well appearing patient in no apparent distress; mood and affect are within normal limits.  A full examination was performed including scalp, head, eyes, ears, nose, lips, neck, chest, axillae, abdomen, back, buttocks, bilateral upper extremities, bilateral lower extremities, hands, feet, fingers, toes, fingernails, and toenails. All findings within normal limits unless otherwise noted below.   Relevant physical exam findings are noted in the Assessment and Plan.       Right Upper Vermilion Lip  3 mm papule with verruca top  Assessment & Plan   LENTIGINES, SEBORRHEIC KERATOSES, HEMANGIOMAS - Benign normal skin lesions - Benign-appearing - Call for any changes  MELANOCYTIC NEVI - Tan-brown and/or pink-flesh-colored symmetric macules and papules - Benign appearing on exam today - Observation - Call clinic for new or  changing moles - Recommend daily use of broad spectrum spf 30+ sunscreen to sun-exposed areas.   ACTINIC DAMAGE - Chronic condition, secondary to cumulative UV/sun exposure - diffuse scaly erythematous macules with underlying dyspigmentation - Recommend daily broad spectrum sunscreen SPF 30+ to sun-exposed areas, reapply every 2 hours as needed.  - Staying in the shade or wearing long sleeves, sun glasses (UVA+UVB protection) and wide brim hats (4-inch brim around the entire circumference of the hat) are also recommended for sun protection.  - Call for new or changing lesions.  SKIN CANCER SCREENING PERFORMED TODAY.   NEOPLASM OF UNCERTAIN BEHAVIOR OF SKIN Right Upper Vermilion Lip Epidermal / dermal shaving  Lesion diameter (cm):  0.3 Informed consent: discussed and consent obtained   Timeout: patient name, date of birth, surgical site, and procedure verified   Procedure prep:  Patient was prepped and draped in usual sterile fashion Prep type:  Isopropyl alcohol Anesthesia: the lesion was anesthetized in a standard fashion   Anesthetic:  1% lidocaine  w/ epinephrine  1-100,000 buffered w/ 8.4% NaHCO3 Instrument used: DermaBlade   Hemostasis achieved with: pressure and aluminum chloride   Outcome: patient tolerated procedure well   Post-procedure details: sterile dressing applied and wound care instructions given   Dressing type: bandage and petrolatum    Specimen A - Surgical pathology Differential Diagnosis:  R/O Verruca vs SCC  Check Margins: No  Return in about 1 year (around 10/22/2024) for TBSE.  I, Jetta Ager, am acting as Neurosurgeon for [employer], DO.  Documentation: I have reviewed the above documentation for accuracy and completeness, and I agree with the above.  Delon Lenis,  DO

## 2023-10-28 ENCOUNTER — Ambulatory Visit: Payer: Self-pay | Admitting: Dermatology

## 2023-10-28 LAB — SURGICAL PATHOLOGY

## 2023-10-30 DIAGNOSIS — H905 Unspecified sensorineural hearing loss: Secondary | ICD-10-CM | POA: Diagnosis not present

## 2023-11-05 DIAGNOSIS — M79672 Pain in left foot: Secondary | ICD-10-CM | POA: Diagnosis not present

## 2023-11-18 ENCOUNTER — Other Ambulatory Visit: Payer: Self-pay | Admitting: Cardiology

## 2023-11-18 DIAGNOSIS — K219 Gastro-esophageal reflux disease without esophagitis: Secondary | ICD-10-CM

## 2023-11-28 DIAGNOSIS — M5416 Radiculopathy, lumbar region: Secondary | ICD-10-CM | POA: Diagnosis not present

## 2023-12-04 DIAGNOSIS — M5416 Radiculopathy, lumbar region: Secondary | ICD-10-CM | POA: Diagnosis not present

## 2024-01-07 ENCOUNTER — Other Ambulatory Visit: Payer: Self-pay | Admitting: Neurology

## 2024-01-12 NOTE — Telephone Encounter (Signed)
 Last filled by patient : 10/12/23 Last office visit : 07/07/23 Next office visit : 04/01/24

## 2024-02-01 NOTE — Progress Notes (Unsigned)
  Cardiology Office Note:   Date:  02/02/2024  ID:  Steven Vincent, DOB 07-Dec-1965, MRN [id number] PCP: Jordan, Betty G, MD   HeartCare Providers Cardiologist:  Lynwood Schilling, MD Electrophysiologist:  Will Gladis Norton, MD {  History of Present Illness:   Steven Vincent is a 58 y.o. male who presents for follow up of  PVCs and bradycardia.   POET (Plain Old Exercise Treadmill)  in January 2018 was normal. Holter in October 2018 showed frequent PVCs with burden 16%. Average HR 66. Slowest HR 40 while asleep. Beta blocker was reduced and eventually stopped. Echo was normal.   In 2019 Lexiscan  Myoview  showed a small fixed apical defect and was otherwise normal. He had repeat Holter in December 2019 showing again frequent PVCs with a burden of 26%. 4 beat run NSVT. Minimum HR 58.  He was seen in the ED on March 9 with concern about slow HR, chest pain, and fatigue. Noted to have pulse of 40 by pulse ox reading. ECG showed frequent PVCs with bigeminy and couplets.   He was eventually treated with Multaq .  He had much improvement in symptoms.    He continued to have chest pain and had no CAD on CT.     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  Pushing the carts is the most exercise that he gets.  The patient denies any new symptoms such as chest discomfort, neck or arm discomfort. There has been no new shortness of breath, PND or orthopnea. There have been no reported palpitations, presyncope or syncope.   ROS: As stated in the HPI and negative for all other systems.  Studies Reviewed:    EKG:   EKG Interpretation Date/Time:  Monday February 02 2024 10:15:33 EST Ventricular Rate:  63 PR Interval:  162 QRS Duration:  88 QT Interval:  440 QTC Calculation: 450 R Axis:   -40  Text Interpretation: Normal sinus rhythm Left axis deviation When compared with ECG of 30-Jan-2023 09:26, No significant change was found Confirmed by Schilling Lynwood  [phone]) on 02/02/2024 10:33:03 AM     Risk Assessment/Calculations:              Physical Exam:   VS:  BP 133/85 (BP Location: Left Arm, Patient Position: Sitting)   Pulse 63   Ht 6' 2 (1.88 m)   Wt 285 lb 3.2 oz (129.4 kg)   SpO2 95%   BMI 36.62 kg/m    Wt Readings from Last 3 Encounters:  02/02/24 285 lb 3.2 oz (129.4 kg)  10/16/23 290 lb 14.4 oz (132 kg)  08/01/23 288 lb (130.6 kg)     GEN: Well nourished, well developed in no acute distress NECK: No JVD; No carotid bruits CARDIAC: RRR, no murmurs, rubs, gallops RESPIRATORY:  Clear to auscultation without rales, wheezing or rhonchi  ABDOMEN: Soft, non-tender, non-distended EXTREMITIES:  No edema; No deformity   ASSESSMENT AND PLAN:   Frequent PVCs:   The patient has no palpitations.  No change in therapy.  Continue Multaq .   Dyslipidemia: LDL was 17 with an HDL of 63.   No change in therapy.  58 with an HDL of 30.  No change in therapy.   OSA:   He does well with his Inspire.   Follow up with me on one year.   Signed, Lynwood Schilling, MD

## 2024-02-02 ENCOUNTER — Ambulatory Visit: Attending: Cardiology | Admitting: Cardiology

## 2024-02-02 ENCOUNTER — Encounter: Payer: Self-pay | Admitting: Cardiology

## 2024-02-02 VITALS — BP 133/85 | HR 63 | Ht 74.0 in | Wt 285.2 lb

## 2024-02-02 DIAGNOSIS — E785 Hyperlipidemia, unspecified: Secondary | ICD-10-CM | POA: Diagnosis not present

## 2024-02-02 DIAGNOSIS — I493 Ventricular premature depolarization: Secondary | ICD-10-CM

## 2024-02-02 DIAGNOSIS — G4733 Obstructive sleep apnea (adult) (pediatric): Secondary | ICD-10-CM

## 2024-02-02 NOTE — Patient Instructions (Signed)
 Medication Instructions:  Your physician recommends that you continue on your current medications as directed. Please refer to the Current Medication list given to you today.  *If you need a refill on your cardiac medications before your next appointment, please call your pharmacy*  Lab Work: NONE If you have labs (blood work) drawn today and your tests are completely normal, you will receive your results only by: MyChart Message (if you have MyChart) OR A paper copy in the mail If you have any lab test that is abnormal or we need to change your treatment, we will call you to review the results.  Testing/Procedures: NONE  Follow-Up: At [facility], you and your health needs are our priority.  As part of our continuing mission to provide you with exceptional heart care, our providers are all part of one team.  This team includes your primary Cardiologist (physician) and Advanced Practice Providers or APPs (Physician Assistants and Nurse Practitioners) who all work together to provide you with the care you need, when you need it.  Your next appointment:   1 year(s)  Provider:   Lavonne Prairie, MD  We recommend signing up for the patient portal called MyChart.  Sign up information is provided on this After Visit Summary.  MyChart is used to connect with patients for Virtual Visits (Telemedicine).  Patients are able to view lab/test results, encounter notes, upcoming appointments, etc.  Non-urgent messages can be sent to your provider as well.   To learn more about what you can do with MyChart, go to [email or web address].

## 2024-02-11 ENCOUNTER — Other Ambulatory Visit: Payer: Self-pay | Admitting: Cardiology

## 2024-02-11 DIAGNOSIS — K219 Gastro-esophageal reflux disease without esophagitis: Secondary | ICD-10-CM

## 2024-02-27 ENCOUNTER — Other Ambulatory Visit: Payer: Self-pay

## 2024-02-27 ENCOUNTER — Telehealth: Payer: Self-pay

## 2024-02-27 DIAGNOSIS — K219 Gastro-esophageal reflux disease without esophagitis: Secondary | ICD-10-CM

## 2024-02-27 MED ORDER — MULTAQ 400 MG PO TABS: 400.0000 mg | ORAL_TABLET | Freq: Two times a day (BID) | ORAL | 3 refills | Status: AC

## 2024-02-27 MED ORDER — PANTOPRAZOLE SODIUM 20 MG PO TBEC: 20.0000 mg | DELAYED_RELEASE_TABLET | Freq: Every day | ORAL | 3 refills | Status: AC

## 2024-02-27 NOTE — Telephone Encounter (Signed)
 Medication has been sent to the mail order pharmacy.  Copied from CRM [id number]. Topic: Clinical - Medication Question >> Feb 27, 2024  1:14 PM Aisha D wrote: Reason for CRM: Pt stated that he recently got new insurance with [facility] and needs to have his medications for dronedarone  (MULTAQ ) 400 MG tablet and pantoprazole  (PROTONIX ) 20 MG tablet resent to [facility]. Pt stated that they are stating they didn't receive the refill request and that it could be due to the new insurance. Pt would like a callback with an update.

## 2024-03-17 ENCOUNTER — Ambulatory Visit: Payer: Self-pay

## 2024-03-17 ENCOUNTER — Ambulatory Visit (INDEPENDENT_AMBULATORY_CARE_PROVIDER_SITE_OTHER): Admitting: Family Medicine

## 2024-03-17 ENCOUNTER — Encounter: Payer: Self-pay | Admitting: Family Medicine

## 2024-03-17 VITALS — BP 146/94 | HR 72 | Temp 98.4°F | Wt 279.8 lb

## 2024-03-17 DIAGNOSIS — L299 Pruritus, unspecified: Secondary | ICD-10-CM | POA: Diagnosis not present

## 2024-03-17 MED ORDER — METHYLPREDNISOLONE ACETATE 80 MG/ML IJ SUSP
80.0000 mg | Freq: Once | INTRAMUSCULAR | Status: AC
  Administered 2024-03-17: 80 mg via INTRAMUSCULAR

## 2024-03-17 NOTE — Patient Instructions (Signed)
 Consider over the counter Allegra, Zyrtec, or Xyzal for the itching  Could consider Aveeno soap  Let me know if shot (Depomedrol)  not helping by Friday.

## 2024-03-17 NOTE — Telephone Encounter (Signed)
 FYI Only or Action Required?: FYI only for provider: appointment scheduled on 03/17/2024 at 4pm with Dr [name] at PCP office.  Patient was last seen in primary care on 07/14/2023 by Jordan, Betty G, MD.  Called Nurse Triage reporting Pruritis.  Symptoms began about 5 days ago.  Interventions attempted: Rest, hydration, or home remedies.  Symptoms are: unchanged.  Triage Disposition: See Physician Within 24 Hours  Patient/caregiver understands and will follow disposition?: Yes      Message from [place] [facility] sent at 03/17/2024 10:48 AM EST  Reason for Triage: possible allergic reaction - possible poison ivy, rash all over, itching, hands swollen, inhaled smoke from burning leaves and wood, cough and drainage    Reason for Disposition  SEVERE itching (i.e., interferes with sleep, normal activities or school)  Answer Assessment - Initial Assessment Questions Patient states that about 5 days ago he started having symptoms He states that he isnt sure if this is poison ivy or something else He was burning something for a friend He states he is itching all over, sinuses draining in his throat, and his hands feel slightly swollen  Patient denies chest pain, difficulty breathing, being bitten or stung by anything, swelling of his lips/tongue/throat, nausea, vomiting, diarrhea  Patient states he inhaled some of the smoke 5 days ago --he states he was fine when he got home and then the next day he felt like he was getting a sinus infection Random spots will start popping up whenever he scratches Hands itch and eyelids itch He denies any difficulty breathing, nausea, vomiting, diarrhea He states this has been unchanged for five days with no worsening of symptoms or new symptoms  Between 12-4pm a Nurse from [facility] is coming in today He wanted to see her first for that appointment and then come in to his PCP office for further evaluation. He states that if she evaluates him in  person and sees that he needs to do something different he will follow that recommendation and let us  know. At this time, patient is scheduled for 4pm with Dr [name] at his PCP office. He will call us  if anything changes  Patient states that none of his medications have been refilled He states he is about to run out of his restless leg medication Ropinirole  (Requip ) Patient has [employer] and they switched him over to a different pharmacy and they wouldn't send his refills  Patient is advised to call us  back if anything changes or with any further questions/concerns. Patient is advised that if anything worsens to go to the Emergency Room. Patient verbalized understanding.  Protocols used: Rash or Redness - [facility]

## 2024-03-17 NOTE — Progress Notes (Signed)
 "  Established Patient Office Visit  Subjective   Patient ID: Steven Vincent, male    DOB: Apr 28, 1965  Age: 59 y.o. MRN: [id number]  Chief Complaint  Patient presents with   Rash   Cough   Nasal Congestion    HPI   Steven Vincent seen with diffuse pruritus which started about 5 days ago.  He does relate recent URI type symptoms last week.  No fever.  No new medications.  No change of soaps or detergents.  He also states he helped a friend burned some brush last Friday and wondered if his itching may be related to that.  Pruritus is very diffuse involving scalp, neck, trunk, upper extremities and even to some extent lower extremities.  He has not tried any antihistamines.  No visible urticaria.  No angioedema symptoms.  He has chronic problems including migraine headaches, obstructive sleep apnea, hyperlipidemia, history of seizures.  Takes multiple medications but no recent medication changes.  Not aware of any obvious food allergies.  No known contacts with anyone else with pruritus.  No contact with children.  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   Dysrhythmia    bradycardia   HLD (hyperlipidemia)    Low back pain    Migraines    OSA (obstructive sleep apnea) 04/10/2017   Personality disorder (HCC)    Seizures (HCC)    Stroke (HCC)    short term memory difficulty   Past Surgical History:  Procedure Laterality Date   COLONOSCOPY     normal   DRUG INDUCED ENDOSCOPY N/A 03/07/2021   Procedure: DRUG INDUCED SLEEP ENDOSCOPY;  Surgeon: Carlie Clark, MD;  Location: [facility];  Service: ENT;  Laterality: N/A;   EXTRACORPOREAL SHOCK WAVE LITHOTRIPSY Right 08/01/2023   Procedure: LITHOTRIPSY, ESWL;  Surgeon: Alvaro Ricardo KATHEE Mickey., MD;  Location: [facility];  Service: Urology;  Laterality: Right;  RIGHT EXTRACORPOREAL SHOCKWAVE LITHOTRIPSY   IMPLANTATION OF HYPOGLOSSAL NERVE STIMULATOR Right 05/16/2021    Procedure: IMPLANTATION OF HYPOGLOSSAL NERVE STIMULATOR;  Surgeon: Carlie Clark, MD;  Location: [facility] OR;  Service: ENT;  Laterality: Right;   PATELLA-FEMORAL ARTHROPLASTY Right 12/25/2021   Procedure: RIGHT PATELLOFEMORAL REPLACEMENT;  Surgeon: Sheril Coy, MD;  Location: [facility];  Service: Orthopedics;  Laterality: Right;   TOOTH EXTRACTION  12/27/2014   lower left   TOTAL KNEE ARTHROPLASTY Left 11/28/2020   Procedure: LEFT KNEE PATELLAFEMORL REPLACEMENT;  Surgeon: Sheril Coy, MD;  Location: [facility];  Service: Orthopedics;  Laterality: Left;    reports that he quit smoking about 34 years ago. His smoking use included cigarettes. He started smoking about 35 years ago. He has a 1 pack-year smoking history. He has been exposed to tobacco smoke. He has never used smokeless tobacco. He reports that he does not drink alcohol and does not use drugs. family history includes Alzheimer's disease in his father; Aneurysm in his mother; Heart disease in his father; Hyperlipidemia in his father; Hypertension in his brother and father; Other in his father; Stroke in his brother. Allergies[1]  Review of Systems  Constitutional:  Negative for chills and fever.  HENT:  Negative for sore throat.   Respiratory:  Negative for shortness of breath.   Skin:  Positive for itching.      Objective:     BP (!) 146/94   Pulse 72   Temp 98.4 F (36.9 C) (Oral)  Wt 279 lb 12.8 oz (126.9 kg)   SpO2 94%   BMI 35.92 kg/m  BP Readings from Last 3 Encounters:  03/17/24 (!) 146/94  02/02/24 133/85  10/23/23 (!) 141/89   Wt Readings from Last 3 Encounters:  03/17/24 279 lb 12.8 oz (126.9 kg)  02/02/24 285 lb 3.2 oz (129.4 kg)  10/16/23 290 lb 14.4 oz (132 kg)      Physical Exam Vitals reviewed.  Constitutional:      General: He is not in acute distress.    Appearance: He is not ill-appearing.  Cardiovascular:     Rate and Rhythm: Normal rate and regular rhythm.  Pulmonary:     Effort: Pulmonary  effort is normal.     Breath sounds: Normal breath sounds. No wheezing or rales.  Skin:    Comments: He has a few excoriations antecubital fossa bilaterally otherwise no visible rash.  No vesicles or pustules.  No urticaria.  Neurological:     Mental Status: He is alert.      No results found for any visits on 03/17/24.    The ASCVD Risk score (Arnett DK, et al., 2019) failed to calculate for the following reasons:   Risk score cannot be calculated because patient has a medical history suggesting prior/existing ASCVD   * - Cholesterol units were assumed    Assessment & Plan:   Problem List Items Addressed This Visit   None Visit Diagnoses       Pruritus    -  Primary   Relevant Medications   methylPREDNISolone  acetate (DEPO-MEDROL ) injection 80 mg (Completed)     59 year old male who is seen with about 5-day history of diffuse pruritus.  No recent change of soaps or detergents.  No new medications.  No visible urticaria.  Does not have any papules or other findings to suggest likely scabies.  No evidence for contact dermatitis.  We discussed that differential is broad.  He has had significant excoriations and we recommend he consider Aveeno soap and also consider over-the-counter antihistamine such as Zyrtec or Xyzal.  We agreed to Depo-Medrol  80 mg IM.  If not improving with the above be in touch and may consider some baseline labs.  We we considered other possibilities such as scabies but does not have any classic findings that would suggest this likely  No follow-ups on file.    [name], MD     [1]  Allergies Allergen Reactions   Delsym [Dextromethorphan] Other (See Comments)    Gets loopy/memory loss   Aspirin  Nausea And Vomiting    GI upset    Mucinex  D [Pseudoephedrine-Guaifenesin  Er] Hypertension    Reaction to decongestant drug ingredient.    Ibuprofen  Other (See Comments)    Stomach irritation   "

## 2024-03-18 ENCOUNTER — Ambulatory Visit: Payer: Self-pay
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
## 2024-03-19 NOTE — Telephone Encounter (Signed)
 Seen 03/17/24. BJ

## 2024-03-26 ENCOUNTER — Ambulatory Visit (INDEPENDENT_AMBULATORY_CARE_PROVIDER_SITE_OTHER): Admitting: Family Medicine

## 2024-03-26 ENCOUNTER — Encounter: Payer: Self-pay | Admitting: Family Medicine

## 2024-03-26 ENCOUNTER — Ambulatory Visit: Payer: Self-pay

## 2024-03-26 VITALS — BP 134/80 | HR 65 | Temp 98.1°F | Wt 280.9 lb

## 2024-03-26 DIAGNOSIS — R21 Rash and other nonspecific skin eruption: Secondary | ICD-10-CM

## 2024-03-26 DIAGNOSIS — G2581 Restless legs syndrome: Secondary | ICD-10-CM | POA: Diagnosis not present

## 2024-03-26 DIAGNOSIS — L299 Pruritus, unspecified: Secondary | ICD-10-CM

## 2024-03-26 MED ORDER — HYDROXYZINE PAMOATE 25 MG PO CAPS: 25.0000 mg | ORAL_CAPSULE | Freq: Three times a day (TID) | ORAL | 0 refills | Status: AC | PRN

## 2024-03-26 MED ORDER — ROPINIROLE HCL 0.5 MG PO TABS: 0.5000 mg | ORAL_TABLET | Freq: Every day | ORAL | 0 refills | Status: AC

## 2024-03-26 MED ORDER — TRIAMCINOLONE ACETONIDE 0.1 % EX CREA: 1.0000 | TOPICAL_CREAM | Freq: Two times a day (BID) | CUTANEOUS | 1 refills | Status: AC | PRN

## 2024-03-26 NOTE — Patient Instructions (Signed)
 Use daily moisturizers - especially after bathing.

## 2024-03-26 NOTE — Progress Notes (Signed)
 "  Established Patient Office Visit  Subjective   Patient ID: Steven Vincent, male    DOB: [birth year]  Age: 59 y.o. MRN: [id number]  Chief Complaint  Patient presents with   Rash    HPI   Steven Vincent is seen today with some persistent pruritus.  Refer to prior note for details.  We had given him Depo-Medrol  80 mg IM but he did not see a lot of improvement.  His itching at this point is mostly confined around the neck region and upper anterior trunk.  Some involving the hands.  No change of soaps or detergents.  Has taken occasional Benadryl .  Symptoms have been going on for over 2 weeks.  Not aware of any contacts with anyone's had any skin rash or pruritus symptoms.  Denies any fevers or chills.  No history of any known iron deficiency.  Patient also requesting refill of Requip  until he can get into see his primary.  Completely out for the past couple of weeks.  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   Dysrhythmia    bradycardia   HLD (hyperlipidemia)    Low back pain    Migraines    OSA (obstructive sleep apnea) 04/10/2017   Personality disorder (HCC)    Seizures (HCC)    Stroke (HCC)    short term memory difficulty   Past Surgical History:  Procedure Laterality Date   COLONOSCOPY     normal   DRUG INDUCED ENDOSCOPY N/A 03/07/2021   Procedure: DRUG INDUCED SLEEP ENDOSCOPY;  Surgeon: Carlie Clark, MD;  Location: [facility];  Service: ENT;  Laterality: N/A;   EXTRACORPOREAL SHOCK WAVE LITHOTRIPSY Right 08/01/2023   Procedure: LITHOTRIPSY, ESWL;  Surgeon: Alvaro Ricardo KATHEE Mickey., MD;  Location: [facility];  Service: Urology;  Laterality: Right;  RIGHT EXTRACORPOREAL SHOCKWAVE LITHOTRIPSY   IMPLANTATION OF HYPOGLOSSAL NERVE STIMULATOR Right 05/16/2021   Procedure: IMPLANTATION OF HYPOGLOSSAL NERVE STIMULATOR;  Surgeon: Carlie Clark, MD;  Location: [facility];  Service: ENT;  Laterality: Right;    PATELLA-FEMORAL ARTHROPLASTY Right 12/25/2021   Procedure: RIGHT PATELLOFEMORAL REPLACEMENT;  Surgeon: Sheril Coy, MD;  Location: [facility];  Service: Orthopedics;  Laterality: Right;   TOOTH EXTRACTION  12/27/2014   lower left   TOTAL KNEE ARTHROPLASTY Left 11/28/2020   Procedure: LEFT KNEE PATELLAFEMORL REPLACEMENT;  Surgeon: Sheril Coy, MD;  Location: [facility];  Service: Orthopedics;  Laterality: Left;    reports that he quit smoking about 34 years ago. His smoking use included cigarettes. He started smoking about 35 years ago. He has a 1 pack-year smoking history. He has been exposed to tobacco smoke. He has never used smokeless tobacco. He reports that he does not drink alcohol and does not use drugs. family history includes Alzheimer's disease in his father; Aneurysm in his mother; Heart disease in his father; Hyperlipidemia in his father; Hypertension in his brother and father; Other in his father; Stroke in his brother. Allergies[1]  Review of Systems  Constitutional:  Negative for chills and fever.  Respiratory:  Negative for shortness of breath.   Cardiovascular:  Negative for chest pain.  Gastrointestinal:  Negative for abdominal pain, nausea and vomiting.  Skin:  Positive for itching and rash.      Objective:     BP 134/80   Pulse 65   Temp 98.1 F (36.7 C) (Oral)   Wt 280  lb 14.4 oz (127.4 kg)   SpO2 95%   BMI 36.07 kg/m  BP Readings from Last 3 Encounters:  03/26/24 134/80  03/17/24 (!) 146/94  02/02/24 133/85   Wt Readings from Last 3 Encounters:  03/26/24 280 lb 14.4 oz (127.4 kg)  03/17/24 279 lb 12.8 oz (126.9 kg)  02/02/24 285 lb 3.2 oz (129.4 kg)      Physical Exam Vitals reviewed.  Constitutional:      General: He is not in acute distress.    Appearance: He is not ill-appearing.  Cardiovascular:     Rate and Rhythm: Normal rate and regular rhythm.  Pulmonary:     Effort: Pulmonary effort is normal.     Breath sounds: Normal breath sounds.   Skin:    Comments: He does have some visible rash today which he did not have last visit.  Upper chest and anterior neck has a few nonspecific small erythematous papules which blanch with pressure.  No scaly rash.  No pustules or vesicles.  Neurological:     Mental Status: He is alert.      No results found for any visits on 03/26/24.    The ASCVD Risk score (Arnett DK, et al., 2019) failed to calculate for the following reasons:   Risk score cannot be calculated because patient has a medical history suggesting prior/existing ASCVD   * - Cholesterol units were assumed    Assessment & Plan:   #1 somewhat generalized pruritus with local rash neck and upper anterior chest.  Etiology unclear.  No recent changes soaps or detergents.  No new medications.  Tried over-the-counter antihistamine without much improvement.  We recommend trial of triamcinolone  0.1% cream to use twice daily as needed.  Consider short-term trial of Atarax  25 mg 1 every 8 hours as needed and cautioned about potential for sedation.  -We had previously discussed getting some labs including CBC, CMP, TSH though doubt systemic etiology  #2 history of restless legs.  Patient requesting refill of Requip  until we can get back into see primary.  1 refill given.   No follow-ups on file.    [name], MD     [1]  Allergies Allergen Reactions   Delsym [Dextromethorphan] Other (See Comments)    Gets loopy/memory loss   Aspirin  Nausea And Vomiting    GI upset    Mucinex  D [Pseudoephedrine-Guaifenesin  Er] Hypertension    Reaction to decongestant drug ingredient.    Ibuprofen  Other (See Comments)    Stomach irritation   "

## 2024-03-26 NOTE — Telephone Encounter (Signed)
 FYI Only or Action Required?: FYI only for provider: appointment scheduled on 03/26/24.  Patient was last seen in primary care on 03/17/2024 by Micheal Wolm ORN, MD.  Called Nurse Triage reporting Pruritis and Rash.  Symptoms began several weeks ago.  Interventions attempted: OTC medications: Benadryl .  Symptoms are: gradually worsening.  Triage Disposition: See PCP When Office is Open (Within 3 Days)  Patient/caregiver understands and will follow disposition?: Yes                         Message from Taleah C sent at 03/26/2024  1:08 PM EST  Reason for Triage: rash getting worse and has now broke out on neck. Benadryl  didn't improve sxs.   Reason for Disposition  Mild widespread rash  (Exception: Heat rash lasting 3 days or less.)  Answer Assessment - Initial Assessment Questions Seen 03/17/24, still itching and rash now spread to neck. Said he needs med refills and [employer] won't refill them. He is asking for a refill for all medications to [facility].   1. APPEARANCE of RASH: What does the rash look like? (e.g., blisters, dry flaky skin, red spots, redness, sores)     Bumpy/hives. Looks like poison ivy. Red dots residual after bumps resolves.  2. SIZE: How big are the spots? (e.g., tip of pen, eraser, coin; inches, centimeters)     pinpoint 3. LOCATION: Where is the rash located?     All over, head/face/neck/armpits/top of feet/tops of hands  4. COLOR: What color is the rash? (Note: It is difficult to assess rash color in people with darker-colored skin. When this situation occurs, simply ask the caller to describe what they see.)     If itching turns real red, normal reddish/pink  5. ONSET: When did the rash begin?     03/12/24  6. FEVER: Do you have a fever? If Yes, ask: What is your temperature, how was it measured, and when did it start?     No.  7. ITCHING: Does the rash itch? If Yes, ask: How bad is the itch?  (Scale 1-10; or mild, moderate, severe)     Yes; varies. Currently 5/10 and becomes severe if he scratches. Treating with Benadryl .  8. CAUSE: What do you think is causing the rash?     Unknown.  9. MEDICINE FACTORS: Have you started any new medicines within the last 2 weeks? (e.g., antibiotics)      No.  10. OTHER SYMPTOMS: Do you have any other symptoms? (e.g., dizziness, headache, sore throat, joint pain)       No facial or tongue swelling, difficulty breathing, fever, neck stiffness, new joint stiffness or pain.  Protocols used: Rash or Redness - [facility]

## 2024-03-27 LAB — CBC WITH DIFFERENTIAL/PLATELET
Absolute Lymphocytes: 2219 {cells}/uL (ref 850–3900)
Absolute Monocytes: 672 {cells}/uL (ref 200–950)
Basophils Absolute: 37 {cells}/uL (ref 0–200)
Basophils Relative: 0.5 %
Eosinophils Absolute: 504 {cells}/uL — ABNORMAL HIGH (ref 15–500)
Eosinophils Relative: 6.9 %
HCT: 47.5 % (ref 39.4–51.1)
Hemoglobin: 16 g/dL (ref 13.2–17.1)
MCH: 31.9 pg (ref 27.0–33.0)
MCHC: 33.7 g/dL (ref 31.6–35.4)
MCV: 94.8 fL (ref 81.4–101.7)
MPV: 13.3 fL — ABNORMAL HIGH (ref 7.5–12.5)
Monocytes Relative: 9.2 %
Neutro Abs: 3869 {cells}/uL (ref 1500–7800)
Neutrophils Relative %: 53 %
Platelets: 206 10*3/uL (ref 140–400)
RBC: 5.01 Million/uL (ref 4.20–5.80)
RDW: 12.3 % (ref 11.0–15.0)
Total Lymphocyte: 30.4 %
WBC: 7.3 10*3/uL (ref 3.8–10.8)

## 2024-03-27 LAB — COMPREHENSIVE METABOLIC PANEL WITH GFR
AG Ratio: 1.6 (calc) (ref 1.0–2.5)
ALT: 23 U/L (ref 9–46)
AST: 20 U/L (ref 10–35)
Albumin: 4.4 g/dL (ref 3.6–5.1)
Alkaline phosphatase (APISO): 65 U/L (ref 35–144)
BUN: 23 mg/dL (ref 7–25)
CO2: 25 mmol/L (ref 20–32)
Calcium: 9.6 mg/dL (ref 8.6–10.3)
Chloride: 108 mmol/L (ref 98–110)
Creat: 0.85 mg/dL (ref 0.70–1.30)
Globulin: 2.8 g/dL (ref 1.9–3.7)
Glucose, Bld: 89 mg/dL (ref 65–99)
Potassium: 4.4 mmol/L (ref 3.5–5.3)
Sodium: 140 mmol/L (ref 135–146)
Total Bilirubin: 0.7 mg/dL (ref 0.2–1.2)
Total Protein: 7.2 g/dL (ref 6.1–8.1)
eGFR: 101 mL/min/{1.73_m2}

## 2024-03-27 LAB — TSH: TSH: 4.07 m[IU]/L (ref 0.40–4.50)

## 2024-03-29 ENCOUNTER — Ambulatory Visit: Payer: Self-pay | Admitting: Family Medicine

## 2024-03-31 NOTE — Progress Notes (Unsigned)
 "   Patient: Steven Vincent Date of Birth: November 15, 1965  Reason for Visit: Follow up History from: Patient Primary Neurologist: Dohmeier   ASSESSMENT AND PLAN 59 y.o. year old male    HISTORY OF PRESENT ILLNESS: Today 03/31/24 EEG was normal.   HISTORY  Steven Vincent is a 59 y.o. male patient who is here for revisit 07/07/2023 for  .  Chief concern according to patient :   I am looking for housing and I need to know how to improve my brain'. I may need to start a medicine ?  I want to work again, but can't get a referral for a job    He feels able to work part time and looks for a routine hours and where he doesn't have to stand and lifting due to back pain, he was limited to 30 pounds.  He has hearing aids.  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   The patient is well groomed. MMSE 24/30.  REVIEW OF SYSTEMS: Out of a complete 14 system review of symptoms, the patient complains only of the following symptoms, and all other reviewed systems are negative.  See HPI  ALLERGIES: Allergies[1]  HOME MEDICATIONS: Outpatient Medications Prior to Visit  Medication Sig Dispense Refill   celecoxib (CELEBREX) 200 MG capsule      diclofenac  (FLECTOR) 1.3 % PTCH 1 patch daily as needed.     diclofenac  Sodium (VOLTAREN ) 1 % GEL Apply 1 application. topically 4 (four) times daily as needed (pain).     diphenoxylate-atropine (LOMOTIL) 2.5-0.025 MG tablet Take 1 tablet by mouth 2 (two) times daily as needed.     dronedarone  (MULTAQ ) 400 MG tablet Take 1 tablet (400 mg total) by mouth 2 (two) times daily with a meal. 200 tablet 3   Erenumab -aooe (AIMOVIG ) 140 MG/ML SOAJ 1 injection every 30 days with auto-injector pen 3 mL 3   hydrOXYzine  (VISTARIL ) 25 MG capsule Take 1 capsule (25 mg total) by mouth every 8 (eight) hours as needed. 30 capsule 0   ketoconazole  (NIZORAL ) 2 % cream Apply 1 Application topically daily. 30 g 0   ketorolac  (TORADOL ) 10  MG tablet Take 1 tablet (10 mg total) by mouth every 6 (six) hours as needed. 20 tablet 0   lidocaine  (LIDODERM ) 5 % 1 patch daily as needed.     Melatonin 5 MG CAPS Take 5 mg by mouth at bedtime.     nitroGLYCERIN  (NITROSTAT ) 0.4 MG SL tablet DISSOLVE 1 TABLET UNDER THE  TONGUE EVERY 5 MINUTES AS NEEDED FOR CHEST PAIN. MAX OF 3 TABLETS IN 15 MINUTES. CALL 911 IF PAIN  PERSISTS. 125 tablet 2   ondansetron  (ZOFRAN -ODT) 8 MG disintegrating tablet Take 1 tablet (8 mg total) by mouth every 8 (eight) hours as needed for nausea or vomiting (after lithotripsy). 10 tablet 0   pantoprazole  (PROTONIX ) 20 MG tablet Take 1 tablet (20 mg total) by mouth daily before breakfast. 100 tablet 3   pravastatin  (PRAVACHOL ) 20 MG tablet TAKE 1 TABLET BY MOUTH DAILY 100 tablet 1   rOPINIRole  (REQUIP ) 0.5 MG tablet Take 1 tablet (0.5 mg total) by mouth at bedtime. 100 tablet 0   SUMAtriptan  (IMITREX ) 25 MG tablet TAKE 1 TABLET BY MOUTH EVERY 2  HOURS AS NEEDED FOR MIGRAINE.  (MAY REPEAT IN 2HRS IF HEADACHE  PERSISTS OR RECURS) MANUFACTURER RECOMMENDS MAX 200MG /DAY 30 tablet 1   topiramate  (TOPAMAX ) 25 MG tablet Take 2 tablets (50  mg total) by mouth in the morning AND 1 tablet (25 mg total) every evening. 270 tablet 1   triamcinolone  cream (KENALOG ) 0.1 % Apply 1 Application topically 2 (two) times daily as needed (rash). 30 g 1   No facility-administered medications prior to visit.    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   Dysrhythmia    bradycardia   HLD (hyperlipidemia)    Low back pain    Migraines    OSA (obstructive sleep apnea) 04/10/2017   Personality disorder (HCC)    Seizures (HCC)    Stroke (HCC)    short term memory difficulty    PAST SURGICAL HISTORY: Past Surgical History:  Procedure Laterality Date   COLONOSCOPY     normal   DRUG INDUCED ENDOSCOPY N/A 03/07/2021   Procedure: DRUG INDUCED SLEEP  ENDOSCOPY;  Surgeon: Carlie Clark, MD;  Location: [facility];  Service: ENT;  Laterality: N/A;   EXTRACORPOREAL SHOCK WAVE LITHOTRIPSY Right 08/01/2023   Procedure: LITHOTRIPSY, ESWL;  Surgeon: Alvaro Ricardo KATHEE Mickey., MD;  Location: [facility];  Service: Urology;  Laterality: Right;  RIGHT EXTRACORPOREAL SHOCKWAVE LITHOTRIPSY   IMPLANTATION OF HYPOGLOSSAL NERVE STIMULATOR Right 05/16/2021   Procedure: IMPLANTATION OF HYPOGLOSSAL NERVE STIMULATOR;  Surgeon: Carlie Clark, MD;  [facility];  Service: ENT;  Laterality: Right;   PATELLA-FEMORAL ARTHROPLASTY Right 12/25/2021   Procedure: RIGHT PATELLOFEMORAL REPLACEMENT;  Surgeon: Sheril Coy, MD;  Location: [facility];  Service: Orthopedics;  Laterality: Right;   TOOTH EXTRACTION  12/27/2014   lower left   TOTAL KNEE ARTHROPLASTY Left 11/28/2020   Procedure: LEFT KNEE PATELLAFEMORL REPLACEMENT;  Surgeon: Sheril Coy, MD;  Location: [facility];  Service: Orthopedics;  Laterality: Left;    FAMILY HISTORY: Family History  Problem Relation Age of Onset   Heart disease Father        defibrilator   Hyperlipidemia Father    Hypertension Father    Alzheimer's disease Father    Other Father        stents in legs   Aneurysm Mother        brain   Hypertension Brother    Stroke Brother        X2    SOCIAL HISTORY: Social History   Socioeconomic History   Marital status: Divorced    Spouse name: Not on file   Number of children: 0   Years of education: Not on file   Highest education level: Not on file  Occupational History   Occupation: disabled  Tobacco Use   Smoking status: Former    Current packs/day: 0.00    Average packs/day: 1 pack/day for 1 year (1.0 ttl pk-yrs)    Types: Cigarettes    Start date: 38    Quit date: 1992    Years since quitting: 34.1    Passive exposure: Past   Smokeless tobacco: Never   Tobacco comments:    Pt stopped smoking in the early 90's  Vaping Use   Vaping status: Never Used   Substance and Sexual Activity   Alcohol use: No   Drug use: No   Sexual activity: Not Currently  Other Topics Concern   Not on file  Social History Narrative   03/16/18: Lives alone with dog, Verlie KATHEE Chow, apartment. Wife of 16 years left him about 2 years ago, and pt. has had difficulty  adjusting.   Says his brother Sharolyn, who lives in [place], is his main contact, source of support, as well as his dog.    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 States he uses painting as additional therapy.     Pt works    Social Drivers of Health   Tobacco Use: Medium Risk (03/26/2024)   Patient History    Smoking Tobacco Use: Former    Smokeless Tobacco Use: Never    Passive Exposure: Past  Physicist, Medical Strain: Medium Risk (04/06/2021)   Overall Financial Resource Strain (CARDIA)    Difficulty of Paying Living Expenses: Somewhat hard  Food Insecurity: Food Insecurity Present (04/06/2021)   Hunger Vital Sign    Worried About Running Out of Food in the Last Year: Often true    Ran Out of Food in the Last Year: Sometimes true  Transportation Needs: No Transportation Needs (11/30/2021)   PRAPARE - Administrator, Civil Service (Medical): No    Lack of Transportation (Non-Medical): No  Physical Activity: Sufficiently Active (04/06/2021)   Exercise Vital Sign    Days of Exercise per Week: 3 days    Minutes of Exercise per Session: 120 min  Stress: No Stress Concern Present (04/06/2021)   [employer] of Occupational Health - Occupational Stress Questionnaire    Feeling of Stress : Not at all  Social Connections: Moderately Integrated (04/06/2021)   Social Connection and Isolation Panel    Frequency of Communication with Friends and Family: More than three times a week    Frequency of Social Gatherings with Friends and Family: More than three times a week    Attends Religious Services:  More than 4 times per year    Active Member of [employer] or Organizations: Yes    Attends Banker Meetings: More than 4 times per year    Marital Status: Divorced  Intimate Partner Violence: Unknown (04/06/2021)   Humiliation, Afraid, Rape, and Kick questionnaire    Fear of Current or Ex-Partner: No    Emotionally Abused: No    Physically Abused: Not on file    Sexually Abused: No  Depression (PHQ2-9): Low Risk (07/14/2023)   Depression (PHQ2-9)    PHQ-2 Score: 0  Alcohol Screen: Not on file  Housing: Low Risk (04/06/2021)   Housing    Last Housing Risk Score: 0  Utilities: Not on file  Health Literacy: Not on file    PHYSICAL EXAM  There were no vitals filed for this visit. There is no height or weight on file to calculate BMI.  Generalized: Well developed, in no acute distress  Neurological examination  Mentation: Alert oriented to time, place, history taking. Follows all commands speech and language fluent Cranial nerve II-XII: Pupils were equal round reactive to light. Extraocular movements were full, visual field were full on confrontational test. Facial sensation and strength were normal. Uvula tongue midline. Head turning and shoulder shrug  were normal and symmetric. Motor: The motor testing reveals 5 over 5 strength of all 4 extremities. Good symmetric motor tone is noted throughout.  Sensory: Sensory testing is intact to soft touch on all 4 extremities. No evidence of extinction is noted.  Coordination: Cerebellar testing reveals good finger-nose-finger and heel-to-shin bilaterally.  Gait and station: Gait is normal. Tandem gait is normal. Romberg is negative. No drift is seen.  Reflexes: Deep tendon reflexes are symmetric and normal bilaterally.   DIAGNOSTIC  DATA (LABS, IMAGING, TESTING) - I reviewed patient records, labs, notes, testing and imaging myself where available.  Lab Results  Component Value Date   WBC 7.3 03/26/2024   HGB 16.0 03/26/2024   HCT  47.5 03/26/2024   MCV 94.8 03/26/2024   PLT 206 03/26/2024      Component Value Date/Time   NA 140 03/26/2024 1411   NA 140 01/30/2023 1007   K 4.4 03/26/2024 1411   CL 108 03/26/2024 1411   CO2 25 03/26/2024 1411   GLUCOSE 89 03/26/2024 1411   BUN 23 03/26/2024 1411   BUN 19 01/30/2023 1007   CREATININE 0.85 03/26/2024 1411   CALCIUM 9.6 03/26/2024 1411   PROT 7.2 03/26/2024 1411   PROT 7.0 08/20/2022 0917   ALBUMIN 4.6 07/24/2023 2142   ALBUMIN 4.2 08/20/2022 0917   AST 20 03/26/2024 1411   ALT 23 03/26/2024 1411   ALKPHOS 70 07/24/2023 2142   BILITOT 0.7 03/26/2024 1411   BILITOT 0.3 08/20/2022 0917   GFRNONAA 59 (L) 07/24/2023 2142   GFRAA 90 04/19/2019 1526   Lab Results  Component Value Date   CHOL 115 08/20/2022   HDL 30 (L) 08/20/2022   LDLCALC 58 08/20/2022   LDLDIRECT 70.0 07/07/2018   TRIG 159 (H) 08/20/2022   CHOLHDL 3.8 08/20/2022   Lab Results  Component Value Date   HGBA1C 5.2 02/05/2022   Lab Results  Component Value Date   VITAMINB12 400 03/10/2023   Lab Results  Component Value Date   TSH 4.07 03/26/2024    Lauraine Born, AGNP-C, DNP 03/31/2024, 1:30 PM [facility] [address] [place], [place] [address] [phone]      [1]  Allergies Allergen Reactions   Delsym [Dextromethorphan] Other (See Comments)    Gets loopy/memory loss   Aspirin  Nausea And Vomiting    GI upset    Mucinex  D [Pseudoephedrine-Guaifenesin  Er] Hypertension    Reaction to decongestant drug ingredient.    Ibuprofen  Other (See Comments)    Stomach irritation   "

## 2024-04-01 ENCOUNTER — Ambulatory Visit: Payer: 59 | Admitting: Neurology

## 2024-04-01 ENCOUNTER — Encounter: Payer: Self-pay | Admitting: Neurology

## 2024-04-02 ENCOUNTER — Telehealth: Payer: Self-pay | Admitting: Neurology

## 2024-04-02 MED ORDER — AIMOVIG 140 MG/ML ~~LOC~~ SOAJ: SUBCUTANEOUS | 3 refills | Status: AC

## 2024-04-02 MED ORDER — SUMATRIPTAN SUCCINATE 25 MG PO TABS: 25.0000 mg | ORAL_TABLET | Freq: Every day | ORAL | 3 refills | Status: AC | PRN

## 2024-04-02 NOTE — Telephone Encounter (Signed)
 Refilled

## 2024-04-02 NOTE — Telephone Encounter (Signed)
 Pt came in needing refill of  Erenumab -aooe (AIMOVIG ) 140 MG/ML SOAJ  SUMAtriptan  (IMITREX ) 25 MG tablet SUMAtriptan  (IMITREX ) 25 MG tablet States his new echostar does not have these on file to be delivered by [facility]  and pt is needing these now. Pt is running low on these medications. Scanned in new RX cards.

## 2024-04-07 ENCOUNTER — Ambulatory Visit: Admitting: Neurology

## 2024-04-30 ENCOUNTER — Ambulatory Visit

## 2024-10-25 ENCOUNTER — Ambulatory Visit: Admitting: Dermatology
